# Patient Record
Sex: Male | Born: 1940 | Race: White | Hispanic: No | Marital: Married | Smoking: Never smoker
Health system: Southern US, Community
[De-identification: ages and names within clinical notes are randomized; demographics above are authoritative.]

## PROBLEM LIST (undated history)

## (undated) DIAGNOSIS — R7301 Impaired fasting glucose: Secondary | ICD-10-CM

## (undated) DIAGNOSIS — I6529 Occlusion and stenosis of unspecified carotid artery: Secondary | ICD-10-CM

## (undated) DIAGNOSIS — G459 Transient cerebral ischemic attack, unspecified: Secondary | ICD-10-CM

## (undated) DIAGNOSIS — F419 Anxiety disorder, unspecified: Secondary | ICD-10-CM

## (undated) DIAGNOSIS — N200 Calculus of kidney: Secondary | ICD-10-CM

## (undated) DIAGNOSIS — I2510 Atherosclerotic heart disease of native coronary artery without angina pectoris: Secondary | ICD-10-CM

## (undated) DIAGNOSIS — N400 Enlarged prostate without lower urinary tract symptoms: Secondary | ICD-10-CM

## (undated) DIAGNOSIS — D539 Nutritional anemia, unspecified: Secondary | ICD-10-CM

## (undated) DIAGNOSIS — E785 Hyperlipidemia, unspecified: Secondary | ICD-10-CM

## (undated) DIAGNOSIS — I700 Atherosclerosis of aorta: Secondary | ICD-10-CM

## (undated) DIAGNOSIS — D472 Monoclonal gammopathy: Secondary | ICD-10-CM

## (undated) DIAGNOSIS — Z9889 Other specified postprocedural states: Secondary | ICD-10-CM

## (undated) DIAGNOSIS — E559 Vitamin D deficiency, unspecified: Secondary | ICD-10-CM

## (undated) DIAGNOSIS — R413 Other amnesia: Secondary | ICD-10-CM

## (undated) DIAGNOSIS — H9193 Unspecified hearing loss, bilateral: Secondary | ICD-10-CM

## (undated) DIAGNOSIS — H6120 Impacted cerumen, unspecified ear: Secondary | ICD-10-CM

## (undated) DIAGNOSIS — D7589 Other specified diseases of blood and blood-forming organs: Secondary | ICD-10-CM

## (undated) DIAGNOSIS — Z860100 Personal history of colon polyps, unspecified: Secondary | ICD-10-CM

## (undated) DIAGNOSIS — I493 Ventricular premature depolarization: Secondary | ICD-10-CM

## (undated) DIAGNOSIS — I10 Essential (primary) hypertension: Secondary | ICD-10-CM

## (undated) HISTORY — DX: Ventricular premature depolarization: I49.3

## (undated) HISTORY — DX: Occlusion and stenosis of unspecified carotid artery: I65.29

## (undated) HISTORY — DX: Other amnesia: R41.3

## (undated) HISTORY — DX: Calculus of kidney: N20.0

## (undated) HISTORY — DX: Nutritional anemia, unspecified: D53.9

## (undated) HISTORY — DX: Other specified postprocedural states: Z98.890

## (undated) HISTORY — DX: Vitamin D deficiency, unspecified: E55.9

## (undated) HISTORY — DX: Transient cerebral ischemic attack, unspecified: G45.9

## (undated) HISTORY — DX: Impacted cerumen, unspecified ear: H61.20

## (undated) HISTORY — DX: Essential (primary) hypertension: I10

## (undated) HISTORY — DX: Hyperlipidemia, unspecified: E78.5

## (undated) HISTORY — DX: Personal history of colonic polyps: Z86.010

## (undated) HISTORY — DX: Other specified diseases of blood and blood-forming organs: D75.89

## (undated) HISTORY — PX: PROSTATECTOMY: SHX69

## (undated) HISTORY — DX: Atherosclerosis of aorta: I70.0

## (undated) HISTORY — DX: Personal history of colon polyps, unspecified: Z86.0100

## (undated) HISTORY — DX: Anxiety disorder, unspecified: F41.9

## (undated) HISTORY — DX: Benign prostatic hyperplasia without lower urinary tract symptoms: N40.0

## (undated) HISTORY — DX: Monoclonal gammopathy: D47.2

## (undated) HISTORY — DX: Impaired fasting glucose: R73.01

## (undated) HISTORY — PX: CATARACT EXTRACTION, BILATERAL: SHX1313

## (undated) HISTORY — DX: Unspecified hearing loss, bilateral: H91.93

## (undated) HISTORY — DX: Atherosclerotic heart disease of native coronary artery without angina pectoris: I25.10

---

## 1997-11-10 ENCOUNTER — Other Ambulatory Visit: Admission: RE | Admit: 1997-11-10 | Discharge: 1997-11-10 | Payer: Self-pay | Admitting: Urology

## 2002-05-31 ENCOUNTER — Encounter: Admission: RE | Admit: 2002-05-31 | Discharge: 2002-05-31 | Payer: Self-pay | Admitting: Urology

## 2002-05-31 ENCOUNTER — Encounter: Payer: Self-pay | Admitting: Urology

## 2002-06-20 ENCOUNTER — Encounter: Payer: Self-pay | Admitting: Urology

## 2002-06-20 ENCOUNTER — Ambulatory Visit (HOSPITAL_BASED_OUTPATIENT_CLINIC_OR_DEPARTMENT_OTHER): Admission: RE | Admit: 2002-06-20 | Discharge: 2002-06-20 | Payer: Self-pay | Admitting: Urology

## 2002-06-27 ENCOUNTER — Encounter: Payer: Self-pay | Admitting: Urology

## 2002-06-27 ENCOUNTER — Encounter: Admission: RE | Admit: 2002-06-27 | Discharge: 2002-06-27 | Payer: Self-pay | Admitting: Urology

## 2008-03-16 IMAGING — CR DG CHEST 2V
2 series · 2 of 2 positions shown · non-contrast
Comparison: None

CLINICAL DATA: Preop for surgery on the prostate for enlarged
prostate

CHEST - 2 VIEW

[w chest pa]
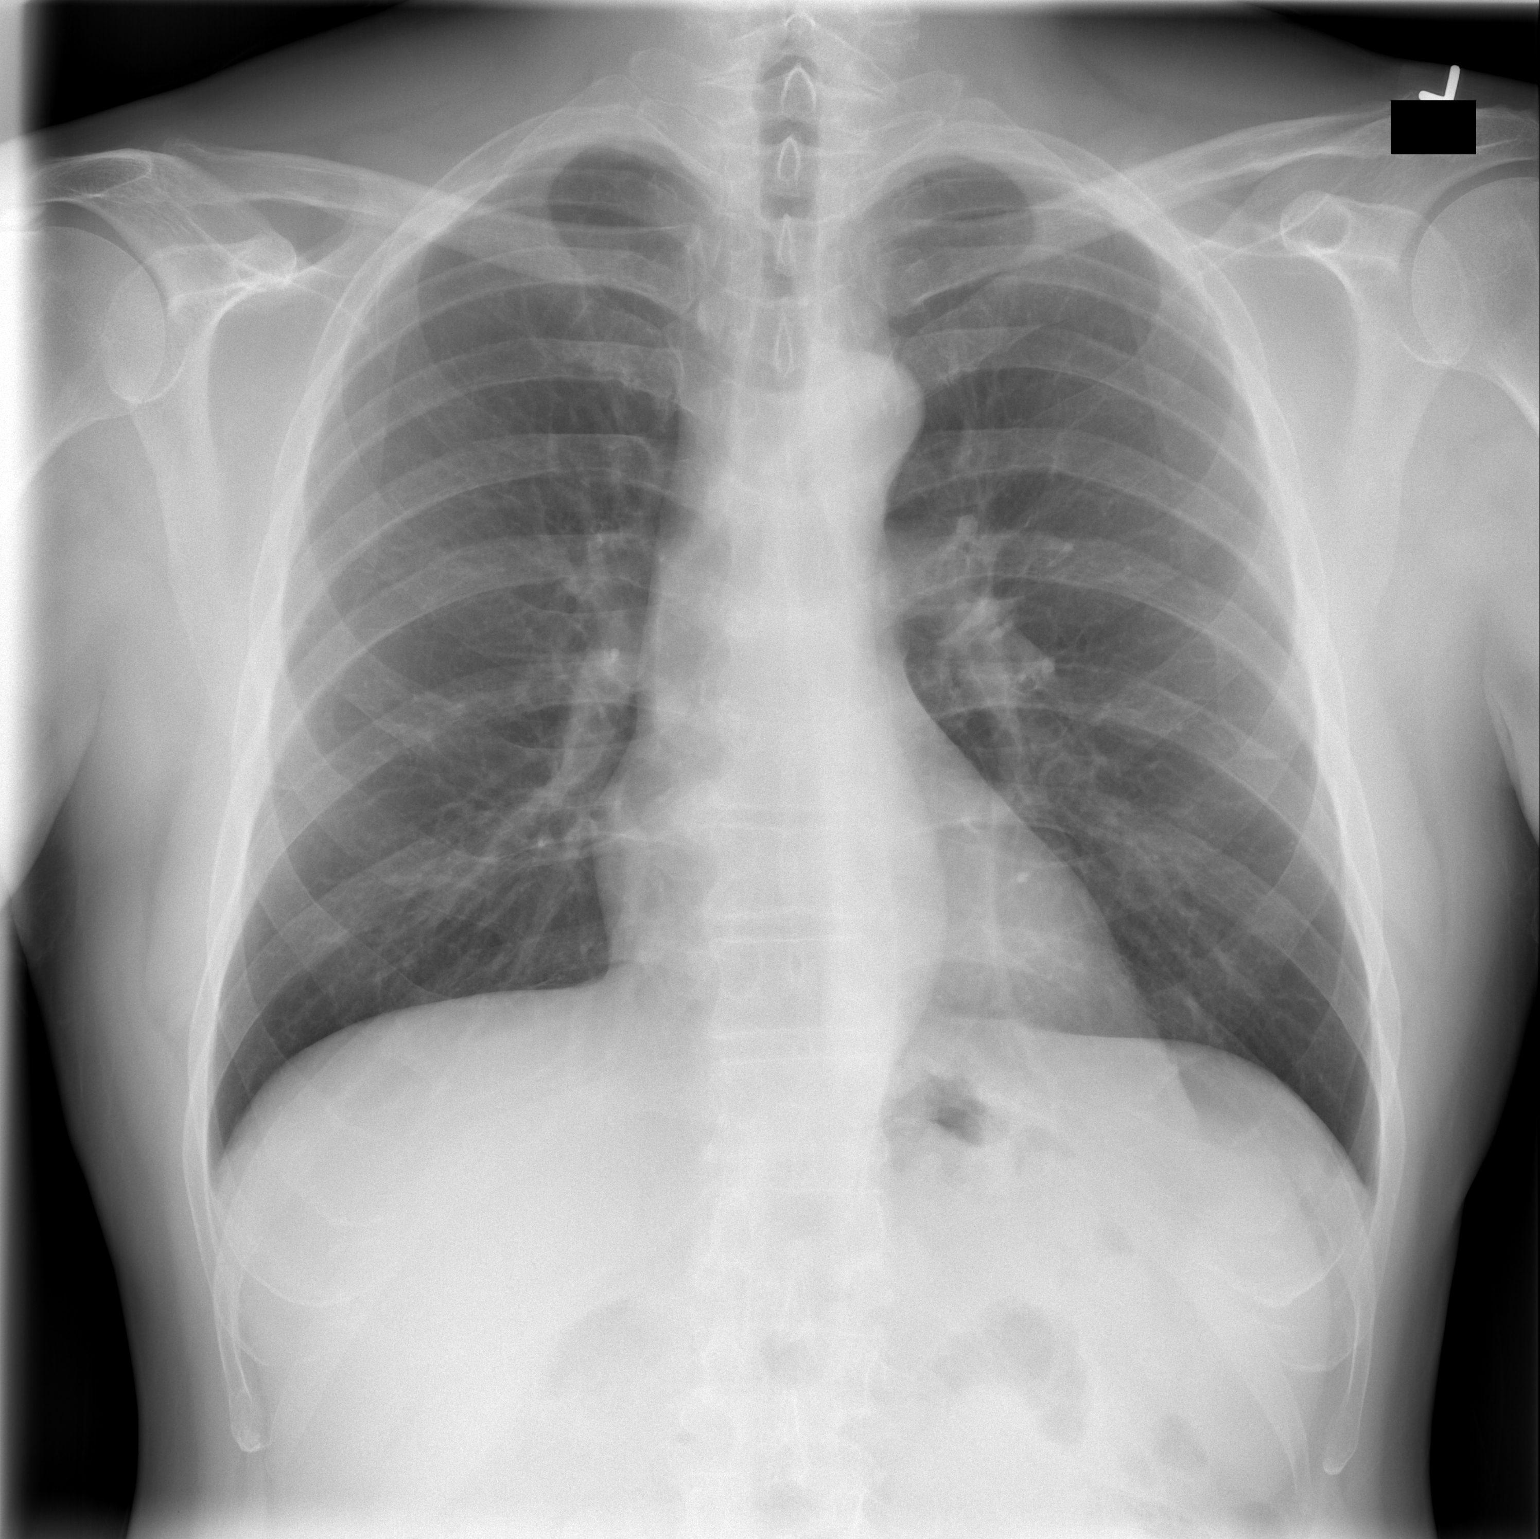

[w chest lat]
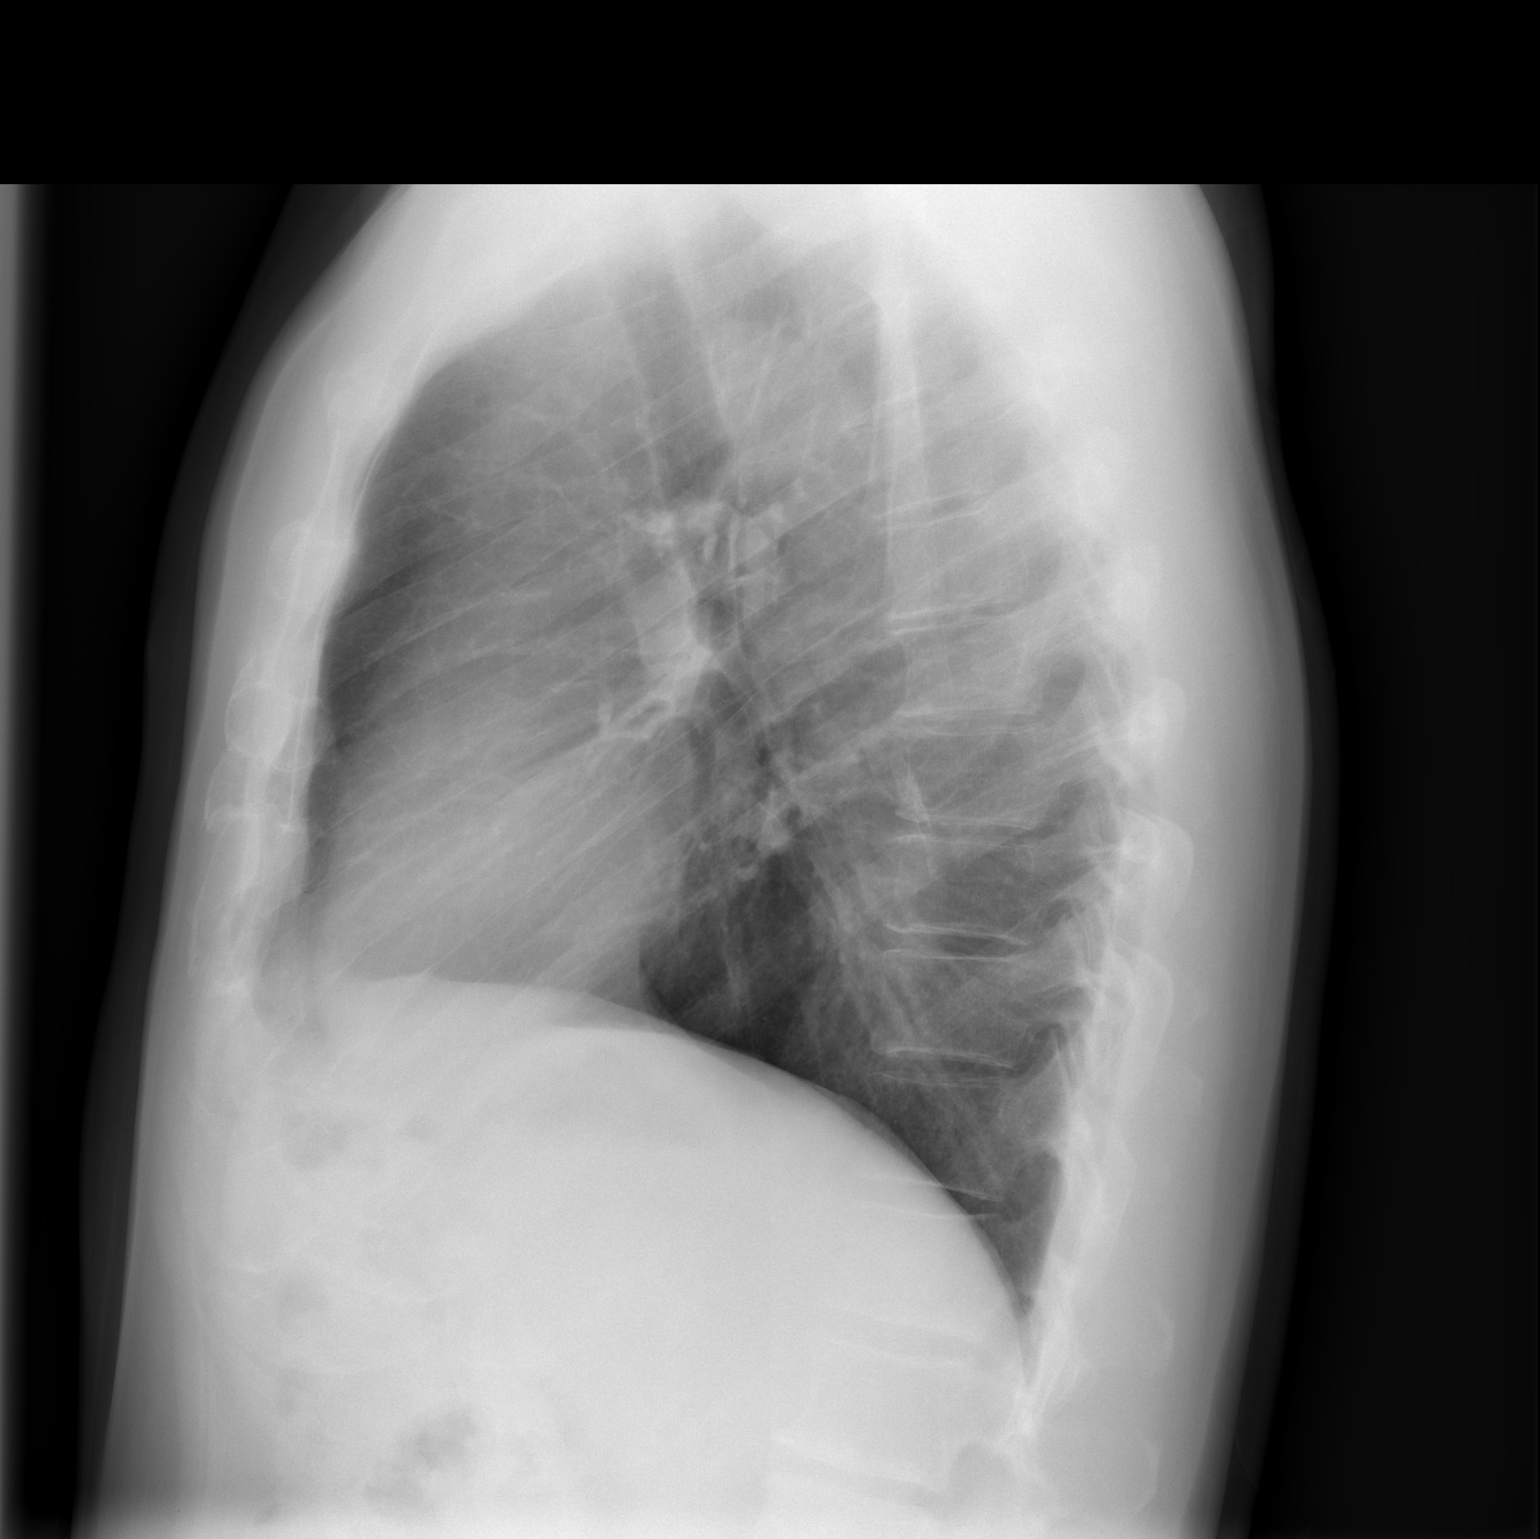

[2 of 2 positions shown; findings below may reference images not displayed]

FINDINGS: Two views of the chest show the lungs to be clear.  The
heart is within normal limits in size.  No bony abnormality is
seen.
IMPRESSION: No active lung disease.

## 2008-03-20 ENCOUNTER — Encounter (INDEPENDENT_AMBULATORY_CARE_PROVIDER_SITE_OTHER): Payer: Self-pay | Admitting: Urology

## 2008-03-20 ENCOUNTER — Inpatient Hospital Stay (HOSPITAL_COMMUNITY): Admission: RE | Admit: 2008-03-20 | Discharge: 2008-03-23 | Payer: Self-pay | Admitting: Urology

## 2010-11-12 NOTE — Op Note (Signed)
NAME:  Patrick Macias, Patrick Macias               ACCOUNT NO.:  [id number]   MEDICAL RECORD NO.:  [id number]          PATIENT TYPE:  INP   LOCATION:  1410                         FACILITY:  [facility]   PHYSICIAN:  Sigmund I. Patsi Sears, M.D.DATE OF BIRTH:  [birth year]   DATE OF PROCEDURE:  03/20/2008  DATE OF DISCHARGE:                               OPERATIVE REPORT   PREOPERATIVE DIAGNOSES:  1. Benign prostatic hypertrophy with obstruction.  2. Urinary retention.   POSTOPERATIVE DIAGNOSIS:  1. Benign prostatic hypertrophy with obstruction.  2. Urinary retention.   PROCEDURE:  Suprapubic prostatectomy.   ATTENDING PHYSICIAN:  Jethro Bolus, M.D.   RESIDENT PHYSICIAN:  Delman Kitten, M.D.   SECOND ASSISTANT:  Delia Chimes, nurse practitioner.   ANESTHESIA:  General plus local.   INDICATIONS FOR PROCEDURE:  Patrick Macias is a 70 year old white male with  known history of benign prostatic hypertrophy.  He has failed medical  therapy and currently has a urinary catheter secondary to recurrent  retention.  On transrectal ultrasound, his prostate measured  approximately 140 grams.  He did have urodynamics preoperatively which  showed a stable bladder with good function.  In discussing his options,  including minimally invasive therapy versus open prostatectomy, he has  elected to proceed with a simple retropubic prostatectomy.  After risks,  benefits, consequences and concerns were discussed, informed consent was  obtained.   PROCEDURE IN DETAIL:  The patient was brought to operating room and  placed in a supine position.  He was correctly identified by his  wristband and an appropriate time-out was taken.  IV antibiotics were  administered, general anesthesia was delivered.  Once adequately  anesthetized, his lower abdomen was clipped, prepped and draped in a  normal sterile fashion.  This was done after a gel pad bump was placed  under his lower back and the table was slightly flexed to allow  better  exposure of his pelvis.  Prior to incision, we placed a 22-French Foley  catheter and inflated balloon with 30 mL of fluid. We made an incision  in the lower midline of his abdomen.  We carried this down with Bovie  electrocautery through the subcutaneous tissue down to the level of the  rectus fascia.  The rectus fascia was divided at the midline through the  entire length of our incision.  The rectus muscles were then separated  in the midline as well.  This exposed the transversalis fascia which was  incised sharply and swept cranially and this exposed the space of  Retzius.  The peritoneum was mobilized cranially as well.  We developed  our plane of dissection bluntly towards both lateral aspects of the  prostate gland and the space of Retzius all the way to the pubic bones  and the endopelvic fascia.  No hernias were appreciated here.  We then  placed initially a Balfour retractor, but this did not allow adequate  exposure.  Subsequently, we removed this and placed our Omni-Tract  retractor with a malleable Marshall blade holding the bladder in a  cephalad position.  We then defatted the anterior aspect of the  prostate  to allow for better exposure.  We took down the puboprostatic ligaments  with Bovie electrocautery and used the Bovie to coagulate the  superficial dorsal vein as well.  We then turned our attention to  achieving hemostasis.  We controlled our dorsal vein with multiple  figure-of-eight sutures using 2-0 Vicryl.  We did encounter some  bleeding during this control, but eventually we were able to get  hemostasis here.  He also had accessory vessels bilaterally that were  spared in our dissection with control of hemostasis.  We also placed two  figure-of-eight sutures using the same 2-0 Vicryl at the level of the  prostatic vascular pedicles bilaterally.  We then made our capsulotomy  sharply with Bovie electrocautery and then Metzenbaum scissors 2 cm  distal to  the bladder neck extending from our pedicle sutures.  We then  developed a plane between the capsule and the adenoma initially using  Metzenbaum scissors until the appropriate plane was established.  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  Once all edges had been freed up, we used Metzenbaum with scissors to  incise the anterior commissure from the bladder neck to the apex.  We  did keep the posterior aspect of the prostate intact.  We identified our  Foley catheter and passed the right lobe of our gland underneath it to  allow for delivery of the entire gland intact.  We then used sharp  dissection to remove the last remaining fibers of the posterior  prostatic urethra that were attached.  We then removed the adenoma from  the wound and passed it off the table after placing marking sutures to  help with pathologic analysis.  We did appreciate that during our  dissection we had entered the bladder neck slightly.  We then irrigated  our prostatic fossa and wound with copious amounts of antibiotics  without any irrigant.  There was no significant bleeding at this point.  Likewise, we began our closure.  We initially reapproximated the urethra  as best we could.  He had a long strip of posterior prostatic urethra  extending down from the bladder neck.  This was tacked into place behind  the Foley catheter with 4-0 Vicryl.  We then reapproximated the right  and left and anterior aspects of the urethra as well in a similar  fashion.  After this, we were able to appreciate near circumferential  closure of the urethra.  Our bladder neck was reapproximated during this  as well.  At this point, we instilled 20 mL more fluid into our Foley  catheter balloon bringing the volume to a total of 50 mL.  We then  closed our prostatic capsule with two different sutures, one from the  right and one from the left meeting in the midline and this was done  with 2-0 Vicryl.   We then copiously irrigated his bladder.  Here, we  found him to have a fair amount of hematuria, but we irrigated him and  there were no clots and we irrigated until he was light pink.  We then  placed our 10-French fluted Blake drain through a stab incision in the  left lower quadrant and placed it in the space of Retzius.  We  reinspected our surgical site one last time and there was no active  bleeding and likewise we elected to close.  Prior to closure, we  irrigated one last time.  We reapproximated the edges of  the rectus  fascia with a running suture using 0-PDS.  The subcutaneous tissue then  reapproximated with simple interrupted sutures using 2-0 Vicryl and the  skin was reapproximated with multiple surgical stainless steel staples.  The wound was recleaned, reinspected and dressed appropriately and this  marked the end of our procedure.  He tolerated the procedure well.  Estimated blood loss was 1800 mL.  He awoke from anesthesia and was  taken to recovery room in stable condition.  There were no  complications.  Dr. Patsi Sears was present and had participated in all  aspects of the case.     ______________________________  Hanley Ben I. Patsi Sears, M.D.  Electronically Signed    DW/MEDQ  D:  03/20/2008  T:  03/21/2008  Job:  [id number]

## 2010-11-12 NOTE — Discharge Summary (Signed)
NAME:  Patrick Macias, Patrick Macias               ACCOUNT NO.:  [id number]   MEDICAL RECORD NO.:  [id number]          PATIENT TYPE:  INP   LOCATION:  1410                         FACILITY:  [facility]   PHYSICIAN:  Sigmund I. Patsi Sears, M.D.DATE OF BIRTH:  04/22/1941   DATE OF ADMISSION:  03/20/2008  DATE OF DISCHARGE:  03/23/2008                               DISCHARGE SUMMARY   PREOPERATIVE DIAGNOSES:  1. Benign prostatic hypertrophy.  2. Urinary retention.   POSTOPERATIVE DIAGNOSES:  1. Benign prostatic hypertrophy.  2. Urinary retention.  3. Acute blood loss anemia.   SECONDARY DIAGNOSIS:  Hypertension.   PROCEDURE:  Simple retropubic prostatectomy on March 20, 2008.   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   INDICATION FOR HOSPITALIZATION:  Patrick Macias is a 70 year old white male  with past medical history positive for benign prostatic hypertrophy.  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  His prostate measured 140 cc on transrectal ultrasound and likewise he  was recommended to have the above stated procedure performed.   HOSPITALIZATION DETAILS:  The patient was sent to the regular patient  ward for his convalescence postoperatively.  He did have a fair amount  of hematuria requiring hourly bladder irrigations by the nursing staff.  This was done very well and at no point did he have any catheter or clot  complications.  By the evening of postop day 1, his urine had begun to  clear.  At the time of discharge, his urine with activity is only light  pink.  Initially, he was n.p.o. and this was advanced during his  hospitalization.  At the time of discharge, he is tolerating a regular  diet without any complaints.  He is passing flatus.  He did have a JP  drain postoperatively which had minimal output.  The drain fluid was  checked and had 1.1 creatinine, and was  removed on the morning of postop  day 3.  During his entire hospitalization, his wounds remained clean,  dry and intact.  His abdomen is soft and appropriately tender.  Otherwise, he has not had any other complications or events during this  hospitalization.  He has had a good recovery from surgery.  His postop  hemoglobin was 9.2 and this was after he received 1 unit of packed red  blood cells intraoperatively.  He was hemodynamically stable, although  we are holding his blood pressure medication at the present time.   On the day of discharge, he is afebrile, tolerating a regular diet, he  is ambulating and is able to perform his activities of daily living.  His pain is controlled on oral medication. Likewise, it is our  impression of the urologic service that he has maximized his benefit as  an inpatient here at [facility] and will be discharged home  appropriately.   DISCHARGE INSTRUCTIONS:  1. No lifting greater than 10 pounds for 6 weeks.  2. No driving for at least 2 weeks.  3. No sexual activity for 6 weeks.  4.  Increase activity slowly.  5. He may walk up steps.  6. Diet is as tolerated, no restrictions.  7. Okay to shower, but no bathing or swimming.  8. Foley catheter instructions have been reviewed with him regarding      the leg bags, night bags and their maintenance.  9. He has been counseled on appropriate activity level especially if      his urine becomes increasingly bloody that he needs to rest and      stay well hydrated.  10.Call or return to clinic if he develops fever greater than 101.5,      uncontrolled pain, wound or catheter problems, or any concerns or      questions arise.   DISCHARGE MEDICATIONS:  Please see medication reconciliation sheet.  He  will be holding his blood pressure medication for now.  He will be  discharged home on Vicodin as well as he has been given a prescription  for Cipro for pericatheter antibiotic treatment.  He will  also be  recommended to take iron sulfate supplementation as well as Colace to  prevent constipation.   FOLLOW UP:  Follow up appointment is next Wednesday or Thursday with Dr.  Patsi Sears.  He will have a cystogram at that time and probable catheter  removal.      Melina Schools, MD      Sigmund I. Patsi Sears, M.D.  Electronically Signed    JR/MEDQ  D:  03/23/2008  T:  03/23/2008  Job:  [id number]

## 2011-03-31 LAB — DIFFERENTIAL
Basophils Relative: 0
Eosinophils Absolute: 0
Lymphs Abs: 1.1
Monocytes Absolute: 1.1 — ABNORMAL HIGH
Monocytes Relative: 12
Neutrophils Relative %: 76

## 2011-03-31 LAB — TYPE AND SCREEN
ABO/RH(D): A POS
Antibody Screen: NEGATIVE

## 2011-03-31 LAB — BASIC METABOLIC PANEL
BUN: 20
CO2: 29
CO2: 32
Calcium: 9.4
Chloride: 101
Chloride: 104
Creatinine, Ser: 0.97
Creatinine, Ser: 0.98
GFR calc Af Amer: >60
GFR calc Af Amer: >60
GFR calc non Af Amer: >60
Glucose, Bld: 112 — ABNORMAL HIGH
Potassium: 4
Potassium: 4.5
Sodium: 136
Sodium: 138

## 2011-03-31 LAB — HEMOGLOBIN AND HEMATOCRIT, BLOOD
HCT: 28.2 — ABNORMAL LOW
HCT: 44

## 2011-03-31 LAB — CBC
HCT: 26.8 — ABNORMAL LOW
Hemoglobin: 9.2 — ABNORMAL LOW
MCHC: 34.4
MCV: 94.4
RBC: 2.84 — ABNORMAL LOW
WBC: 9

## 2011-03-31 LAB — POCT I-STAT 4, (NA,K, GLUC, HGB,HCT)
HCT: 26 — ABNORMAL LOW
Sodium: 138

## 2011-03-31 LAB — ABO/RH: ABO/RH(D): A POS

## 2011-03-31 LAB — CREATININE, FLUID (PLEURAL, PERITONEAL, JP DRAINAGE)

## 2013-06-09 ENCOUNTER — Other Ambulatory Visit: Payer: Self-pay | Admitting: Gastroenterology

## 2013-08-02 ENCOUNTER — Encounter: Payer: Self-pay | Admitting: Family Medicine

## 2013-08-02 ENCOUNTER — Ambulatory Visit (INDEPENDENT_AMBULATORY_CARE_PROVIDER_SITE_OTHER): Payer: Medicare HMO | Admitting: Family Medicine

## 2013-08-02 VITALS — BP 148/71 | HR 54 | Ht 72.0 in | Wt 175.0 lb

## 2013-08-02 DIAGNOSIS — M25519 Pain in unspecified shoulder: Secondary | ICD-10-CM

## 2013-08-02 DIAGNOSIS — M25511 Pain in right shoulder: Secondary | ICD-10-CM

## 2013-08-02 NOTE — Patient Instructions (Signed)
You have rotator cuff impingement, calcific rotator cuff tendinitis. Try to avoid painful activities (overhead activities, lifting with extended arm) as much as possible. Aleve 2 tabs twice a day with food OR ibuprofen 3 tabs three times a day with food for pain and inflammation. Can take tylenol in addition to this. Subacromial injection may be beneficial to help with pain and to decrease inflammation - consider this in the future if exercises not helping. Do home exercise program with theraband and scapular stabilization exercises daily - these are very important for long term relief even if an injection was given.  3 sets of 10 each exercise once a day. Consider nitro patches in future. If not improving at follow-up we will consider further imaging, physical therapy, injection, and/or nitro patches. Follow up with me in about 6 weeks.

## 2013-08-04 ENCOUNTER — Encounter: Payer: Self-pay | Admitting: Family Medicine

## 2013-08-04 NOTE — Progress Notes (Signed)
Patient ID: Patrick Macias, male   DOB: 03-22-1941, 73 y.o.   MRN: [id number]  PCP: Tommy Medal, MD  Subjective:   HPI: Patient is a 73 y.o. male here for right shoulder pain.  Patient reports having had some pain in this shoulder dating back to a year ago but nothing constant. Then past 2 months has been more constant. No injury. Worse with reaching and reaching behind, pushing/pulling. Tried acupuncture, aleve. + night pain. Has not done PT or home exercise program.  Past Medical History  Diagnosis Date  . Hypertension     No current outpatient prescriptions on file prior to visit.   No current facility-administered medications on file prior to visit.    Past Surgical History  Procedure Laterality Date  . Prostatectomy      No Known Allergies  History   Social History  . Marital Status: Married    Spouse Name: N/A    Number of Children: N/A  . Years of Education: N/A   Occupational History  . Not on file.   Social History Main Topics  . Smoking status: Never Smoker   . Smokeless tobacco: Not on file  . Alcohol Use: Not on file  . Drug Use: Not on file  . Sexual Activity: Not on file   Other Topics Concern  . Not on file   Social History Narrative  . No narrative on file    Family History  Problem Relation Age of Onset  . Sudden death Neg Hx   . Hypertension Neg Hx   . Hyperlipidemia Neg Hx   . Diabetes Neg Hx   . Heart attack Neg Hx     BP 148/71  Pulse 54  Ht 6' (1.829 m)  Wt 175 lb (79.379 kg)  BMI 23.73 kg/m2  Review of Systems: See HPI above.    Objective:  Physical Exam:  Gen: NAD  Right shoulder: No swelling, ecchymoses.  No gross deformity. No TTP AC, biceps tendon. FROM with painful arc. Positive Hawkins, Neers. Negative Speeds, Yergasons. Strength 5/5 with empty can and resisted internal/external rotation.  Pain empty can. Negative apprehension. NV intact distally.    Assessment & Plan:  1. Right shoulder pain -  2/2 rotator cuff impingement, calcific rotator cuff tendinitis on radiographs.  Start with home exercise program, nsaids.  Consider PT, nitro patches, injection if not improving.  F/u in 6 weeks.

## 2013-08-10 ENCOUNTER — Encounter: Payer: Self-pay | Admitting: Family Medicine

## 2013-08-10 DIAGNOSIS — M25511 Pain in right shoulder: Secondary | ICD-10-CM | POA: Insufficient documentation

## 2013-08-10 NOTE — Assessment & Plan Note (Signed)
2/2 rotator cuff impingement, calcific rotator cuff tendinitis on radiographs.  Start with home exercise program, nsaids.  Consider PT, nitro patches, injection if not improving.  F/u in 6 weeks.

## 2013-09-13 ENCOUNTER — Ambulatory Visit: Payer: Medicare HMO | Admitting: Family Medicine

## 2015-05-08 DIAGNOSIS — Z23 Encounter for immunization: Secondary | ICD-10-CM | POA: Diagnosis not present

## 2015-06-27 DIAGNOSIS — I10 Essential (primary) hypertension: Secondary | ICD-10-CM | POA: Diagnosis not present

## 2015-06-27 DIAGNOSIS — N400 Enlarged prostate without lower urinary tract symptoms: Secondary | ICD-10-CM | POA: Diagnosis not present

## 2015-08-08 DIAGNOSIS — M720 Palmar fascial fibromatosis [Dupuytren]: Secondary | ICD-10-CM | POA: Diagnosis not present

## 2015-08-08 DIAGNOSIS — D2372 Other benign neoplasm of skin of left lower limb, including hip: Secondary | ICD-10-CM | POA: Diagnosis not present

## 2015-09-22 DIAGNOSIS — S61207A Unspecified open wound of left little finger without damage to nail, initial encounter: Secondary | ICD-10-CM | POA: Diagnosis not present

## 2015-10-02 DIAGNOSIS — Z5189 Encounter for other specified aftercare: Secondary | ICD-10-CM | POA: Diagnosis not present

## 2015-10-02 DIAGNOSIS — Z4802 Encounter for removal of sutures: Secondary | ICD-10-CM | POA: Diagnosis not present

## 2015-10-04 DIAGNOSIS — I10 Essential (primary) hypertension: Secondary | ICD-10-CM | POA: Diagnosis not present

## 2015-10-04 DIAGNOSIS — D126 Benign neoplasm of colon, unspecified: Secondary | ICD-10-CM | POA: Diagnosis not present

## 2015-10-04 DIAGNOSIS — Z1389 Encounter for screening for other disorder: Secondary | ICD-10-CM | POA: Diagnosis not present

## 2015-10-04 DIAGNOSIS — Z125 Encounter for screening for malignant neoplasm of prostate: Secondary | ICD-10-CM | POA: Diagnosis not present

## 2015-10-04 DIAGNOSIS — Z5189 Encounter for other specified aftercare: Secondary | ICD-10-CM | POA: Diagnosis not present

## 2015-10-04 DIAGNOSIS — Z6824 Body mass index (BMI) 24.0-24.9, adult: Secondary | ICD-10-CM | POA: Diagnosis not present

## 2016-03-18 DIAGNOSIS — D7589 Other specified diseases of blood and blood-forming organs: Secondary | ICD-10-CM | POA: Diagnosis not present

## 2016-03-18 DIAGNOSIS — E559 Vitamin D deficiency, unspecified: Secondary | ICD-10-CM | POA: Diagnosis not present

## 2016-03-18 DIAGNOSIS — E784 Other hyperlipidemia: Secondary | ICD-10-CM | POA: Diagnosis not present

## 2016-03-18 DIAGNOSIS — R7301 Impaired fasting glucose: Secondary | ICD-10-CM | POA: Diagnosis not present

## 2016-03-18 DIAGNOSIS — Z125 Encounter for screening for malignant neoplasm of prostate: Secondary | ICD-10-CM | POA: Diagnosis not present

## 2016-03-18 DIAGNOSIS — Z23 Encounter for immunization: Secondary | ICD-10-CM | POA: Diagnosis not present

## 2016-03-18 DIAGNOSIS — I10 Essential (primary) hypertension: Secondary | ICD-10-CM | POA: Diagnosis not present

## 2016-04-09 DIAGNOSIS — H2510 Age-related nuclear cataract, unspecified eye: Secondary | ICD-10-CM | POA: Diagnosis not present

## 2016-04-09 DIAGNOSIS — H524 Presbyopia: Secondary | ICD-10-CM | POA: Diagnosis not present

## 2016-04-09 DIAGNOSIS — Z0100 Encounter for examination of eyes and vision without abnormal findings: Secondary | ICD-10-CM | POA: Diagnosis not present

## 2016-07-21 DIAGNOSIS — H9193 Unspecified hearing loss, bilateral: Secondary | ICD-10-CM | POA: Diagnosis not present

## 2016-07-21 DIAGNOSIS — H6123 Impacted cerumen, bilateral: Secondary | ICD-10-CM | POA: Diagnosis not present

## 2016-07-21 DIAGNOSIS — J329 Chronic sinusitis, unspecified: Secondary | ICD-10-CM | POA: Diagnosis not present

## 2016-07-25 DIAGNOSIS — R509 Fever, unspecified: Secondary | ICD-10-CM | POA: Diagnosis not present

## 2016-07-25 DIAGNOSIS — J09X2 Influenza due to identified novel influenza A virus with other respiratory manifestations: Secondary | ICD-10-CM | POA: Diagnosis not present

## 2016-07-25 DIAGNOSIS — R05 Cough: Secondary | ICD-10-CM | POA: Diagnosis not present

## 2016-08-01 DIAGNOSIS — Z1283 Encounter for screening for malignant neoplasm of skin: Secondary | ICD-10-CM | POA: Diagnosis not present

## 2016-08-01 DIAGNOSIS — D225 Melanocytic nevi of trunk: Secondary | ICD-10-CM | POA: Diagnosis not present

## 2016-08-01 DIAGNOSIS — D485 Neoplasm of uncertain behavior of skin: Secondary | ICD-10-CM | POA: Diagnosis not present

## 2016-08-01 DIAGNOSIS — L570 Actinic keratosis: Secondary | ICD-10-CM | POA: Diagnosis not present

## 2016-08-01 DIAGNOSIS — X32XXXD Exposure to sunlight, subsequent encounter: Secondary | ICD-10-CM | POA: Diagnosis not present

## 2016-10-17 DIAGNOSIS — H5203 Hypermetropia, bilateral: Secondary | ICD-10-CM | POA: Diagnosis not present

## 2016-10-17 DIAGNOSIS — H2513 Age-related nuclear cataract, bilateral: Secondary | ICD-10-CM | POA: Diagnosis not present

## 2016-10-28 DIAGNOSIS — E559 Vitamin D deficiency, unspecified: Secondary | ICD-10-CM | POA: Diagnosis not present

## 2016-10-28 DIAGNOSIS — R7301 Impaired fasting glucose: Secondary | ICD-10-CM | POA: Diagnosis not present

## 2016-10-28 DIAGNOSIS — E784 Other hyperlipidemia: Secondary | ICD-10-CM | POA: Diagnosis not present

## 2016-10-28 DIAGNOSIS — I10 Essential (primary) hypertension: Secondary | ICD-10-CM | POA: Diagnosis not present

## 2016-10-28 DIAGNOSIS — Z125 Encounter for screening for malignant neoplasm of prostate: Secondary | ICD-10-CM | POA: Diagnosis not present

## 2016-11-03 DIAGNOSIS — E784 Other hyperlipidemia: Secondary | ICD-10-CM | POA: Diagnosis not present

## 2016-11-03 DIAGNOSIS — E559 Vitamin D deficiency, unspecified: Secondary | ICD-10-CM | POA: Diagnosis not present

## 2016-11-03 DIAGNOSIS — I10 Essential (primary) hypertension: Secondary | ICD-10-CM | POA: Diagnosis not present

## 2016-11-03 DIAGNOSIS — H6123 Impacted cerumen, bilateral: Secondary | ICD-10-CM | POA: Diagnosis not present

## 2016-11-03 DIAGNOSIS — D7589 Other specified diseases of blood and blood-forming organs: Secondary | ICD-10-CM | POA: Diagnosis not present

## 2016-11-03 DIAGNOSIS — H9193 Unspecified hearing loss, bilateral: Secondary | ICD-10-CM | POA: Diagnosis not present

## 2016-11-03 DIAGNOSIS — D126 Benign neoplasm of colon, unspecified: Secondary | ICD-10-CM | POA: Diagnosis not present

## 2016-11-03 DIAGNOSIS — Z0000 Encounter for general adult medical examination without abnormal findings: Secondary | ICD-10-CM | POA: Diagnosis not present

## 2016-11-03 DIAGNOSIS — R7301 Impaired fasting glucose: Secondary | ICD-10-CM | POA: Diagnosis not present

## 2016-11-03 DIAGNOSIS — Z1389 Encounter for screening for other disorder: Secondary | ICD-10-CM | POA: Diagnosis not present

## 2016-12-02 DIAGNOSIS — D485 Neoplasm of uncertain behavior of skin: Secondary | ICD-10-CM | POA: Diagnosis not present

## 2016-12-23 DIAGNOSIS — H25043 Posterior subcapsular polar age-related cataract, bilateral: Secondary | ICD-10-CM | POA: Diagnosis not present

## 2016-12-23 DIAGNOSIS — H2511 Age-related nuclear cataract, right eye: Secondary | ICD-10-CM | POA: Diagnosis not present

## 2016-12-23 DIAGNOSIS — H25013 Cortical age-related cataract, bilateral: Secondary | ICD-10-CM | POA: Diagnosis not present

## 2016-12-23 DIAGNOSIS — H2513 Age-related nuclear cataract, bilateral: Secondary | ICD-10-CM | POA: Diagnosis not present

## 2016-12-23 DIAGNOSIS — H0230 Blepharochalasis unspecified eye, unspecified eyelid: Secondary | ICD-10-CM | POA: Diagnosis not present

## 2016-12-24 DIAGNOSIS — Z87442 Personal history of urinary calculi: Secondary | ICD-10-CM | POA: Diagnosis not present

## 2016-12-24 DIAGNOSIS — R1084 Generalized abdominal pain: Secondary | ICD-10-CM | POA: Diagnosis not present

## 2017-02-23 DIAGNOSIS — H2511 Age-related nuclear cataract, right eye: Secondary | ICD-10-CM | POA: Diagnosis not present

## 2017-02-24 DIAGNOSIS — H2512 Age-related nuclear cataract, left eye: Secondary | ICD-10-CM | POA: Diagnosis not present

## 2017-02-27 DIAGNOSIS — Z23 Encounter for immunization: Secondary | ICD-10-CM | POA: Diagnosis not present

## 2017-03-03 DIAGNOSIS — Z961 Presence of intraocular lens: Secondary | ICD-10-CM | POA: Diagnosis not present

## 2017-03-03 DIAGNOSIS — H2511 Age-related nuclear cataract, right eye: Secondary | ICD-10-CM | POA: Diagnosis not present

## 2017-03-05 DIAGNOSIS — H6123 Impacted cerumen, bilateral: Secondary | ICD-10-CM | POA: Diagnosis not present

## 2017-03-16 DIAGNOSIS — H2512 Age-related nuclear cataract, left eye: Secondary | ICD-10-CM | POA: Diagnosis not present

## 2017-03-23 DIAGNOSIS — H2512 Age-related nuclear cataract, left eye: Secondary | ICD-10-CM | POA: Diagnosis not present

## 2017-05-27 DIAGNOSIS — R69 Illness, unspecified: Secondary | ICD-10-CM | POA: Diagnosis not present

## 2017-06-17 DIAGNOSIS — Z0101 Encounter for examination of eyes and vision with abnormal findings: Secondary | ICD-10-CM | POA: Diagnosis not present

## 2017-07-02 DIAGNOSIS — Z0101 Encounter for examination of eyes and vision with abnormal findings: Secondary | ICD-10-CM | POA: Diagnosis not present

## 2017-09-23 DIAGNOSIS — L821 Other seborrheic keratosis: Secondary | ICD-10-CM | POA: Diagnosis not present

## 2017-09-23 DIAGNOSIS — Z1283 Encounter for screening for malignant neoplasm of skin: Secondary | ICD-10-CM | POA: Diagnosis not present

## 2018-01-12 DIAGNOSIS — E7849 Other hyperlipidemia: Secondary | ICD-10-CM | POA: Diagnosis not present

## 2018-01-12 DIAGNOSIS — E559 Vitamin D deficiency, unspecified: Secondary | ICD-10-CM | POA: Diagnosis not present

## 2018-01-12 DIAGNOSIS — I10 Essential (primary) hypertension: Secondary | ICD-10-CM | POA: Diagnosis not present

## 2018-01-12 DIAGNOSIS — Z125 Encounter for screening for malignant neoplasm of prostate: Secondary | ICD-10-CM | POA: Diagnosis not present

## 2018-01-12 DIAGNOSIS — R82998 Other abnormal findings in urine: Secondary | ICD-10-CM | POA: Diagnosis not present

## 2018-01-12 DIAGNOSIS — R7301 Impaired fasting glucose: Secondary | ICD-10-CM | POA: Diagnosis not present

## 2018-01-19 DIAGNOSIS — Z6823 Body mass index (BMI) 23.0-23.9, adult: Secondary | ICD-10-CM | POA: Diagnosis not present

## 2018-01-19 DIAGNOSIS — E559 Vitamin D deficiency, unspecified: Secondary | ICD-10-CM | POA: Diagnosis not present

## 2018-01-19 DIAGNOSIS — R69 Illness, unspecified: Secondary | ICD-10-CM | POA: Diagnosis not present

## 2018-01-19 DIAGNOSIS — H9193 Unspecified hearing loss, bilateral: Secondary | ICD-10-CM | POA: Diagnosis not present

## 2018-01-19 DIAGNOSIS — R7301 Impaired fasting glucose: Secondary | ICD-10-CM | POA: Diagnosis not present

## 2018-01-19 DIAGNOSIS — E7849 Other hyperlipidemia: Secondary | ICD-10-CM | POA: Diagnosis not present

## 2018-01-19 DIAGNOSIS — D126 Benign neoplasm of colon, unspecified: Secondary | ICD-10-CM | POA: Diagnosis not present

## 2018-01-19 DIAGNOSIS — Z0000 Encounter for general adult medical examination without abnormal findings: Secondary | ICD-10-CM | POA: Diagnosis not present

## 2018-01-19 DIAGNOSIS — I10 Essential (primary) hypertension: Secondary | ICD-10-CM | POA: Diagnosis not present

## 2018-01-19 DIAGNOSIS — Z1389 Encounter for screening for other disorder: Secondary | ICD-10-CM | POA: Diagnosis not present

## 2018-02-01 DIAGNOSIS — R972 Elevated prostate specific antigen [PSA]: Secondary | ICD-10-CM | POA: Diagnosis not present

## 2018-04-16 DIAGNOSIS — R972 Elevated prostate specific antigen [PSA]: Secondary | ICD-10-CM | POA: Diagnosis not present

## 2018-04-22 DIAGNOSIS — R972 Elevated prostate specific antigen [PSA]: Secondary | ICD-10-CM | POA: Diagnosis not present

## 2018-04-27 DIAGNOSIS — Z23 Encounter for immunization: Secondary | ICD-10-CM | POA: Diagnosis not present

## 2018-05-20 DIAGNOSIS — Z0100 Encounter for examination of eyes and vision without abnormal findings: Secondary | ICD-10-CM | POA: Diagnosis not present

## 2018-06-03 DIAGNOSIS — R972 Elevated prostate specific antigen [PSA]: Secondary | ICD-10-CM | POA: Diagnosis not present

## 2018-06-10 DIAGNOSIS — R972 Elevated prostate specific antigen [PSA]: Secondary | ICD-10-CM | POA: Diagnosis not present

## 2018-06-11 DIAGNOSIS — H18413 Arcus senilis, bilateral: Secondary | ICD-10-CM | POA: Diagnosis not present

## 2018-06-11 DIAGNOSIS — Z961 Presence of intraocular lens: Secondary | ICD-10-CM | POA: Diagnosis not present

## 2018-06-11 DIAGNOSIS — H02831 Dermatochalasis of right upper eyelid: Secondary | ICD-10-CM | POA: Diagnosis not present

## 2018-06-11 DIAGNOSIS — H26493 Other secondary cataract, bilateral: Secondary | ICD-10-CM | POA: Diagnosis not present

## 2018-06-11 DIAGNOSIS — H26491 Other secondary cataract, right eye: Secondary | ICD-10-CM | POA: Diagnosis not present

## 2018-06-29 DIAGNOSIS — H26492 Other secondary cataract, left eye: Secondary | ICD-10-CM | POA: Diagnosis not present

## 2019-01-17 DIAGNOSIS — R82998 Other abnormal findings in urine: Secondary | ICD-10-CM | POA: Diagnosis not present

## 2019-01-17 DIAGNOSIS — Z125 Encounter for screening for malignant neoplasm of prostate: Secondary | ICD-10-CM | POA: Diagnosis not present

## 2019-01-17 DIAGNOSIS — R7301 Impaired fasting glucose: Secondary | ICD-10-CM | POA: Diagnosis not present

## 2019-01-17 DIAGNOSIS — E7849 Other hyperlipidemia: Secondary | ICD-10-CM | POA: Diagnosis not present

## 2019-01-17 DIAGNOSIS — I10 Essential (primary) hypertension: Secondary | ICD-10-CM | POA: Diagnosis not present

## 2019-01-24 DIAGNOSIS — D126 Benign neoplasm of colon, unspecified: Secondary | ICD-10-CM | POA: Diagnosis not present

## 2019-01-24 DIAGNOSIS — Z0000 Encounter for general adult medical examination without abnormal findings: Secondary | ICD-10-CM | POA: Diagnosis not present

## 2019-01-24 DIAGNOSIS — Z1331 Encounter for screening for depression: Secondary | ICD-10-CM | POA: Diagnosis not present

## 2019-01-24 DIAGNOSIS — H9193 Unspecified hearing loss, bilateral: Secondary | ICD-10-CM | POA: Diagnosis not present

## 2019-01-24 DIAGNOSIS — R69 Illness, unspecified: Secondary | ICD-10-CM | POA: Diagnosis not present

## 2019-01-24 DIAGNOSIS — R7301 Impaired fasting glucose: Secondary | ICD-10-CM | POA: Diagnosis not present

## 2019-01-24 DIAGNOSIS — E785 Hyperlipidemia, unspecified: Secondary | ICD-10-CM | POA: Diagnosis not present

## 2019-01-24 DIAGNOSIS — I10 Essential (primary) hypertension: Secondary | ICD-10-CM | POA: Diagnosis not present

## 2019-01-24 DIAGNOSIS — D7589 Other specified diseases of blood and blood-forming organs: Secondary | ICD-10-CM | POA: Diagnosis not present

## 2019-01-24 DIAGNOSIS — E559 Vitamin D deficiency, unspecified: Secondary | ICD-10-CM | POA: Diagnosis not present

## 2019-01-25 ENCOUNTER — Other Ambulatory Visit: Payer: Self-pay | Admitting: Internal Medicine

## 2019-01-25 DIAGNOSIS — E785 Hyperlipidemia, unspecified: Secondary | ICD-10-CM

## 2019-02-03 DIAGNOSIS — Z961 Presence of intraocular lens: Secondary | ICD-10-CM | POA: Diagnosis not present

## 2019-02-03 DIAGNOSIS — H524 Presbyopia: Secondary | ICD-10-CM | POA: Diagnosis not present

## 2019-02-04 ENCOUNTER — Ambulatory Visit
Admission: RE | Admit: 2019-02-04 | Discharge: 2019-02-04 | Disposition: A | Discharge status: Home / Self Care | Payer: No Typology Code available for payment source | Source: Ambulatory Visit | Attending: Internal Medicine | Admitting: Internal Medicine

## 2019-02-04 DIAGNOSIS — E785 Hyperlipidemia, unspecified: Secondary | ICD-10-CM

## 2019-02-04 IMAGING — CT CT HEART SCORING
3 series · 14 of 20 positions shown, 16 images · non-contrast
Comparison: None.

CLINICAL DATA: 77-year-old Caucasian male with history of
hypertension.

EXAM:
CT HEART FOR CALCIUM SCORING
TECHNIQUE: CT heart was performed using prospective ECG gating.
A non-contrast exam for calcium scoring was performed.
Note that this exam targets the heart and the chest was not imaged
in its entirety.

[Series 2: calcium scoring 2.00 qr36 bestdiast 68% · axial · 0.38mm/px · z∈[+1522,+1594]mm · 4 of 60 slices shown]
[im 12/60  vessel]
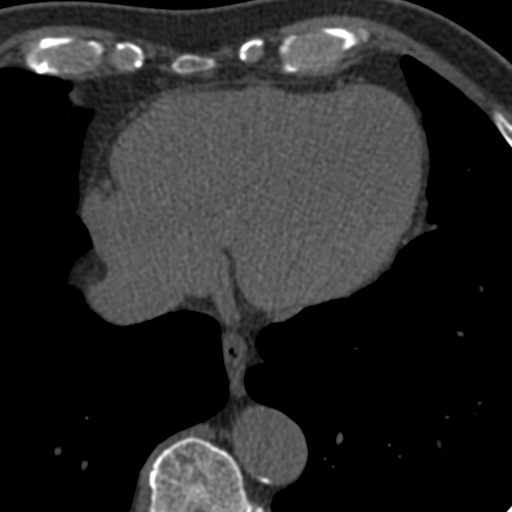
[im 24/60  vessel]
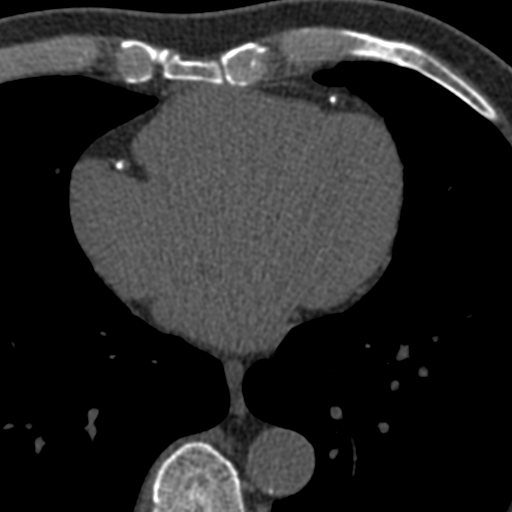
[im 36/60  vessel]
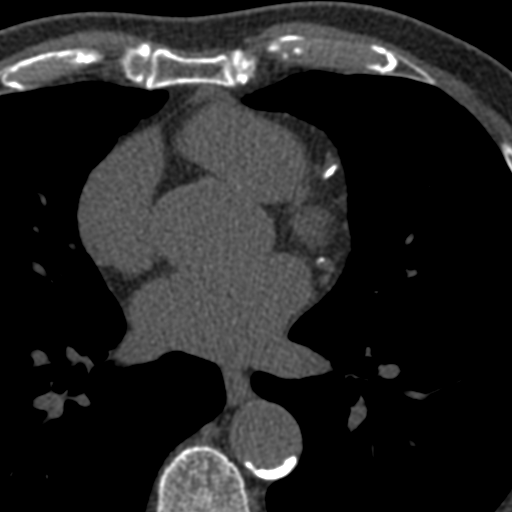
[im 48/60  vessel]
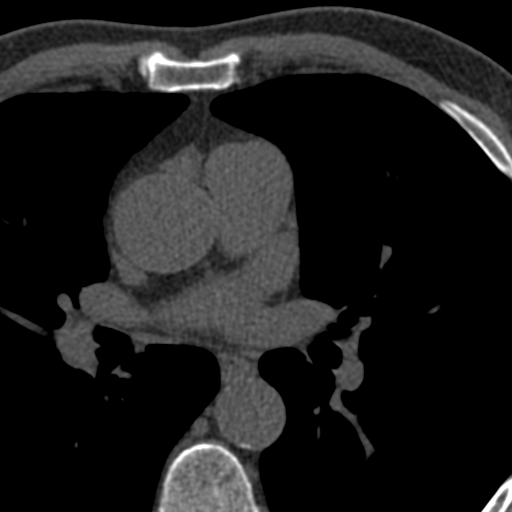

[Series 3: calcium scoring 2.00 br40 bestdiast 68% fov · axial · 0.63mm/px · z∈[+1518,+1598]mm · 5 of 60 slices shown, 7 images]
[im 10/60  vessel]
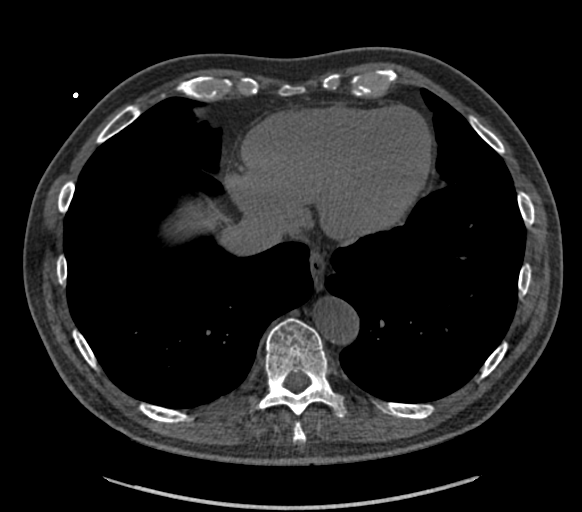
[im 10/60  lung]
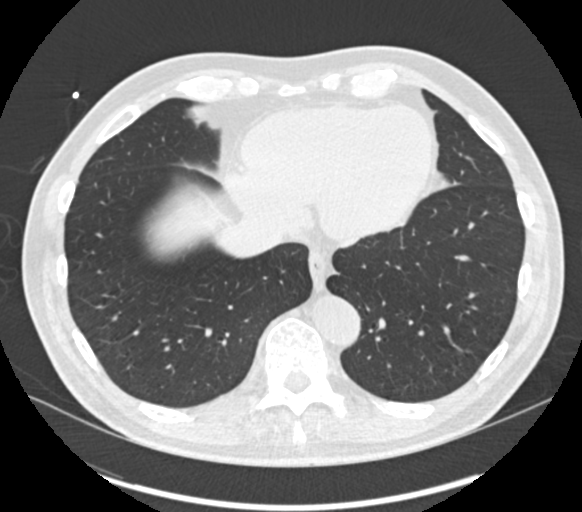
[im 20/60  vessel]
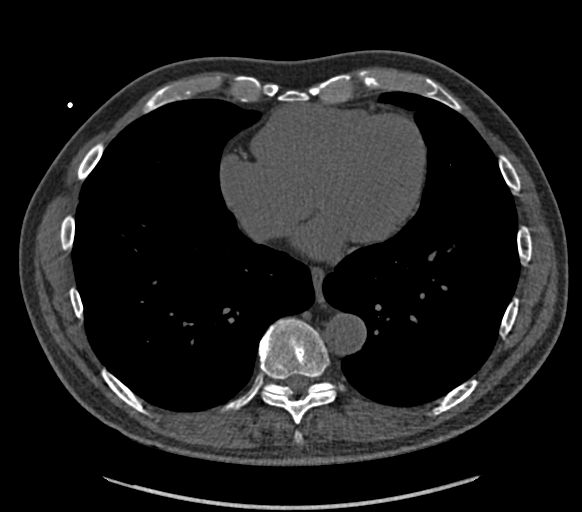
[im 30/60  vessel]
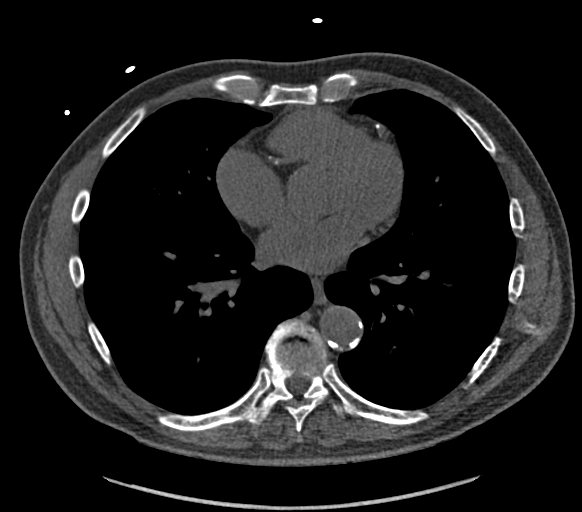
[im 40/60  vessel]
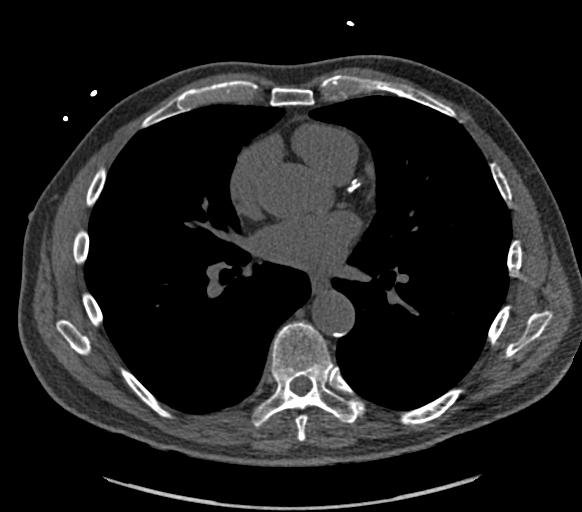
[im 50/60  vessel]
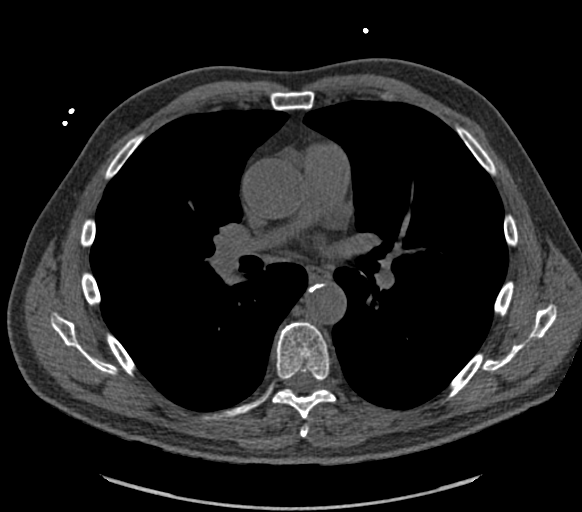
[im 50/60  lung]
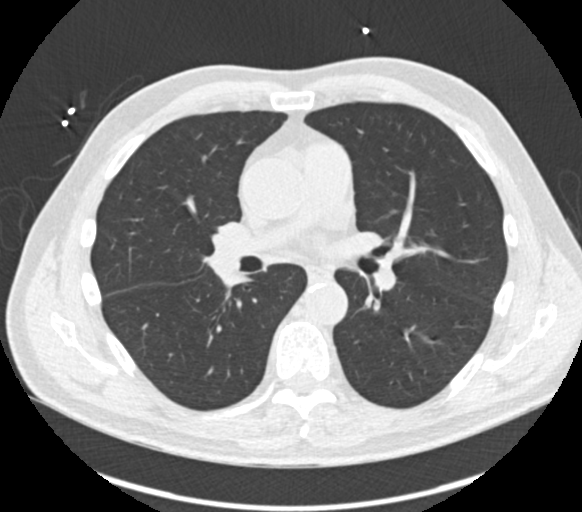

[Series 9: calcium scoring 2.00 br60 bestdiast 68% fov · axial · 0.67mm/px · z∈[+1518,+1598]mm · 5 of 60 slices shown]
[im 10/60  vessel]
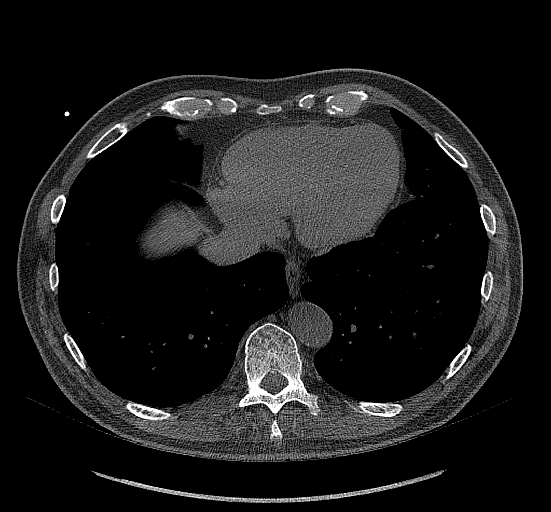
[im 20/60  vessel]
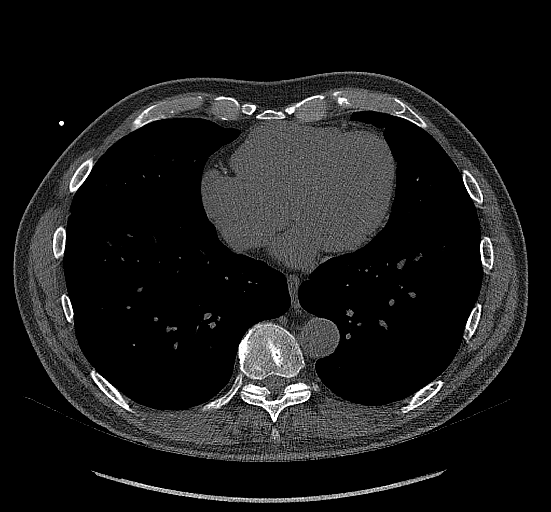
[im 30/60  vessel]
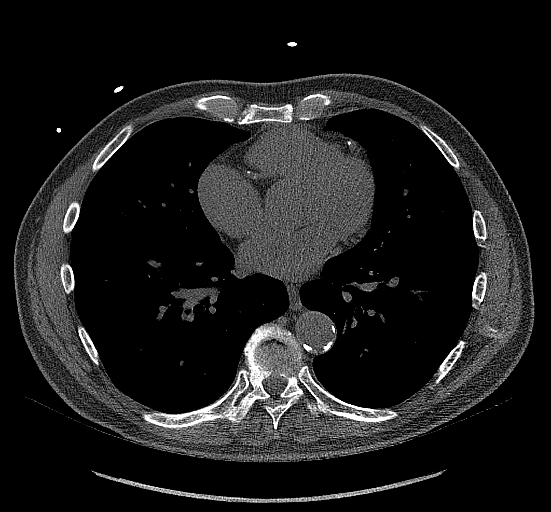
[im 40/60  vessel]
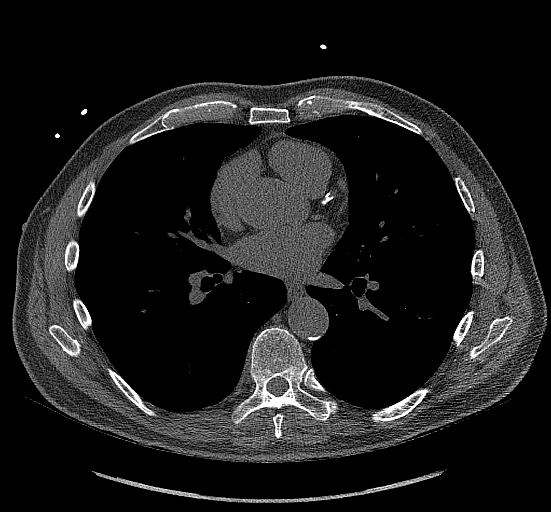
[im 50/60  vessel]
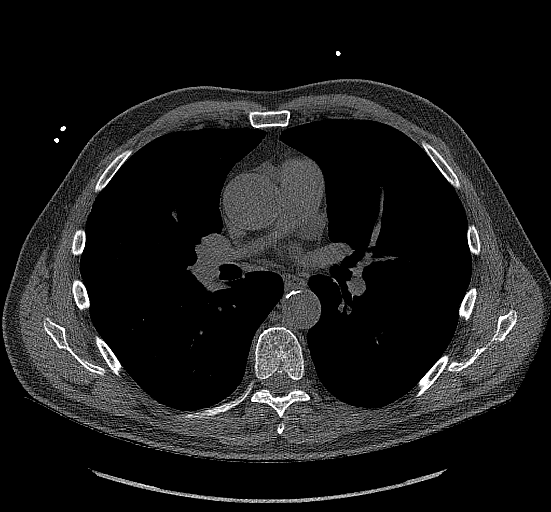

[14 of 20 positions shown; findings below may reference images not displayed]

FINDINGS: Technical quality: Good.

CORONARY CALCIUM

Total Agatston Score: 846

[HOSPITAL] percentile: 75th

OTHER FINDINGS:

Aortic atherosclerosis. Within the visualized portions of the thorax
there are no suspicious appearing pulmonary nodules or masses, there
is no acute consolidative airspace disease, no pleural effusions, no
pneumothorax and no lymphadenopathy. Visualized portions of the
upper abdomen are unremarkable. There are no aggressive appearing
lytic or blastic lesions noted in the visualized portions of the
skeleton.
IMPRESSION: 1. Patient's total coronary artery calcium score is 846 which is
75th percentile for patient's of matched age, gender and
race/ethnicity. Please note that although the presence of coronary
artery calcium documents the presence of coronary artery disease,
the severity of this disease and any potential stenosis cannot be
assessed on this noncontrast CT examination. Assessment for
potential risk factor modification, dietary therapy or pharmacologic
therapy may be warranted, if clinically indicated.
2.  Aortic Atherosclerosis ([S1]-[S1]).

## 2019-02-24 DIAGNOSIS — K5730 Diverticulosis of large intestine without perforation or abscess without bleeding: Secondary | ICD-10-CM | POA: Diagnosis not present

## 2019-02-24 DIAGNOSIS — K635 Polyp of colon: Secondary | ICD-10-CM | POA: Diagnosis not present

## 2019-02-24 DIAGNOSIS — Z86010 Personal history of colonic polyps: Secondary | ICD-10-CM | POA: Diagnosis not present

## 2019-03-01 DIAGNOSIS — K635 Polyp of colon: Secondary | ICD-10-CM | POA: Diagnosis not present

## 2019-03-23 DIAGNOSIS — R69 Illness, unspecified: Secondary | ICD-10-CM | POA: Diagnosis not present

## 2019-03-29 DIAGNOSIS — D225 Melanocytic nevi of trunk: Secondary | ICD-10-CM | POA: Diagnosis not present

## 2019-03-29 DIAGNOSIS — Z1283 Encounter for screening for malignant neoplasm of skin: Secondary | ICD-10-CM | POA: Diagnosis not present

## 2019-03-29 DIAGNOSIS — D485 Neoplasm of uncertain behavior of skin: Secondary | ICD-10-CM | POA: Diagnosis not present

## 2019-05-10 NOTE — Progress Notes (Signed)
Cardiology Office Note:    Date:  05/11/2019   ID:  Patrick Macias, DOB 23-Jan-1941, MRN [id number]  PCP:  Tommy Medal, MD  Cardiologist:  No primary care provider on file.   Referring MD: Crist Infante, MD   Chief Complaint  Patient presents with  . Coronary Artery Disease    Asymptomatic.    History of Present Illness:    Patrick Macias is a 78 y.o. male with a hx of CAD and referred by Dr. Joylene Draft for consultation because of a high risk coronary calcium score greater than 800.  Mr. Woulfe is a pleasant gentleman.  He seems to have some mild difficulty with memory.  He denies any chest discomfort or symptoms suggestive of heart disease.  He specifically denies orthopnea, PND, dyspnea on exertion, peripheral edema, palpitations, and syncope.  He does not exercise/maintain activity as he used to 3 years ago.  He stopped going to his gym and has become more sedentary.  He states that this was not because of any particular symptoms that limited activity.  Past Medical History:  Diagnosis Date  . Anxiety   . BPH (benign prostatic hyperplasia)   . Cerumen impaction   . Dyslipidemia   . H/O lithotripsy   . Hearing decreased, bilateral   . Hx of colonic polyps   . Hypertension   . IFG (impaired fasting glucose)   . Macrocytosis   . Nephrolithiasis   . Vitamin D deficiency     Past Surgical History:  Procedure Laterality Date  . CATARACT EXTRACTION, BILATERAL    . PROSTATECTOMY      Current Medications: Current Meds  Medication Sig  . Cholecalciferol (VITAMIN D-3) 125 MCG (5000 UT) TABS Take 1 tablet by mouth once a week.  . citalopram (CELEXA) 20 MG tablet Take 20 mg by mouth daily.  Marland Kitchen lisinopril-hydrochlorothiazide (ZESTORETIC) 20-25 MG tablet Take 1 tablet by mouth daily.  . rosuvastatin (CRESTOR) 20 MG tablet Take 20 mg by mouth at bedtime.     Allergies:   Patient has no known allergies.   Social History   Socioeconomic History  . Marital status: Married   Spouse name: Not on file  . Number of children: Not on file  . Years of education: Not on file  . Highest education level: Not on file  Occupational History  . Not on file  Social Needs  . Financial resource strain: Not on file  . Food insecurity    Worry: Not on file    Inability: Not on file  . Transportation needs    Medical: Not on file    Non-medical: Not on file  Tobacco Use  . Smoking status: Never Smoker  . Smokeless tobacco: Never Used  Substance and Sexual Activity  . Alcohol use: Yes    Comment: Rarely  . Drug use: Never  . Sexual activity: Not on file  Lifestyle  . Physical activity    Days per week: Not on file    Minutes per session: Not on file  . Stress: Not on file  Relationships  . Social Herbalist on phone: Not on file    Gets together: Not on file    Attends religious service: Not on file    Active member of club or organization: Not on file    Attends meetings of clubs or organizations: Not on file    Relationship status: Not on file  Other Topics Concern  . Not on file  Social History Narrative  . Not on file     Family History: The patient's family history is negative for Sudden death, Hypertension, Hyperlipidemia, Diabetes, and Heart attack.  ROS:   Please see the history of present illness.    He has never been a smoker.  There is no family history of vascular disease.  He sleeps well.  He is on a statin and states that this was started recently in response to his cholesterol level.  All other systems reviewed and are negative.  EKGs/Labs/Other Studies Reviewed:    The following studies were reviewed today: CT cardiac calcium score August 2020: IMPRESSION: 1. Patient's total coronary artery calcium score is 846 which is 75th percentile for patient's of matched age, gender and race/ethnicity. Please note that although the presence of coronary artery calcium documents the presence of coronary artery disease, the severity of  this disease and any potential stenosis cannot be assessed on this noncontrast CT examination. Assessment for potential risk factor modification, dietary therapy or pharmacologic therapy may be warranted, if clinically indicated. 2.  Aortic Atherosclerosis (ICD10-I70.0).  EKG:  EKG normal sinus rhythm, with normal appearance.  Recent Labs: No results found for requested labs within last 8760 hours.  Recent Lipid Panel No results found for: CHOL, TRIG, HDL, CHOLHDL, VLDL, LDLCALC, LDLDIRECT  Physical Exam:    VS:  BP 132/62   Pulse (!) 53   Ht 6' (1.829 m)   Wt 173 lb 1.9 oz (78.5 kg)   SpO2 99%   BMI 23.48 kg/m     Wt Readings from Last 3 Encounters:  05/11/19 173 lb 1.9 oz (78.5 kg)  08/02/13 175 lb (79.4 kg)     GEN: Slender and age-appropriate in appearance. No acute distress HEENT: Normal NECK: No JVD. LYMPHATICS: No lymphadenopathy CARDIAC: RRR without murmur, gallop, or edema. VASCULAR:  Normal Pulses. No bruits. RESPIRATORY:  Clear to auscultation without rales, wheezing or rhonchi  ABDOMEN: Soft, non-tender, non-distended, No pulsatile mass, MUSCULOSKELETAL: No deformity  SKIN: Warm and dry NEUROLOGIC:  Alert and oriented x 3 PSYCHIATRIC:  Normal affect   ASSESSMENT:    1. Coronary artery disease involving native coronary artery of native heart without angina pectoris   2. Agatston coronary artery calcium score greater than 400   3. Hyperlipidemia LDL goal <70   4. Educated about COVID-19 virus infection    PLAN:    In order of problems listed above:  1. Primary event prevention is discussed in great detail.  He should take a coated 81 mg aspirin per day.  He should get 150 minutes of moderate activity per week.  He should have LDL lowering to less than 70 and preferably in the 55 range.  He should have glycemic control assessed.  He does not smoke.  He gets greater than 6 hours of sleep per night. 2. The coronary calcium score is high risk, greater than  800.  We discussed the potential implications.  We will plan to perform a Lexiscan myocardial perfusion study to exclude the possibility of high-grade obstructive disease that has collateralized.  He had a stress test done greater than 15 years ago when he was having exertional chest tightness but was reassured when the test was "normal". 3. He is on statin therapy that was in reaction to the recent blood work and coronary calcium score.  My assumption is follow-up LDL will be done by Dr. Joylene Draft.  The most recent LDL was 90 on July 20 which is when  the patient says he started statin therapy. 4. The W3's is discussed relative to COVID-19 prevention.  Overall education and awareness concerning primary/secondary risk prevention was discussed in detail: LDL less than 70, hemoglobin A1c less than 7, blood pressure target less than 130/80 mmHg, >150 minutes of moderate aerobic activity per week, avoidance of smoking, weight control (via diet and exercise), and continued surveillance/management of/for obstructive sleep apnea.    Medication Adjustments/Labs and Tests Ordered: Current medicines are reviewed at length with the patient today.  Concerns regarding medicines are outlined above.  Orders Placed This Encounter  Procedures  . MYOCARDIAL PERFUSION IMAGING  . EKG 12-Lead   Meds ordered this encounter  Medications  . aspirin EC 81 MG tablet    Sig: Take 1 tablet (81 mg total) by mouth daily.    Dispense:  90 tablet    Refill:  3    Patient Instructions  Medication Instructions:  1) START Aspirin 81mg  once daily  *If you need a refill on your cardiac medications before your next appointment, please call your pharmacy*  Lab Work: None If you have labs (blood work) drawn today and your tests are completely normal, you will receive your results only by: Marland Kitchen MyChart Message (if you have MyChart) OR . A paper copy in the mail If you have any lab test that is abnormal or we need to change your  treatment, we will call you to review the results.  Testing/Procedures: Your physician has requested that you have a lexiscan myoview. For further information please visit [email or web address]. Please follow instruction sheet, as given.   Follow-Up: At [facility], you and your health needs are our priority.  As part of our continuing mission to provide you with exceptional heart care, we have created designated Provider Care Teams.  These Care Teams include your primary Cardiologist (physician) and Advanced Practice Providers (APPs -  Physician Assistants and Nurse Practitioners) who all work together to provide you with the care you need, when you need it.  Your next appointment:   12 months  The format for your next appointment:   In Person  Provider:   You may see Dr. Daneen Schick or one of the following Advanced Practice Providers on your designated Care Team:    Truitt Merle, NP  Cecilie Kicks, NP  Kathyrn Drown, NP   Other Instructions      Signed, Sinclair Grooms, MD  05/11/2019 10:50 AM    [place]

## 2019-05-11 ENCOUNTER — Encounter: Payer: Self-pay | Admitting: *Deleted

## 2019-05-11 ENCOUNTER — Encounter: Payer: Self-pay | Admitting: Interventional Cardiology

## 2019-05-11 ENCOUNTER — Other Ambulatory Visit: Payer: Self-pay

## 2019-05-11 ENCOUNTER — Ambulatory Visit: Payer: Medicare HMO | Admitting: Interventional Cardiology

## 2019-05-11 VITALS — BP 132/62 | HR 53 | Ht 72.0 in | Wt 173.1 lb

## 2019-05-11 DIAGNOSIS — R931 Abnormal findings on diagnostic imaging of heart and coronary circulation: Secondary | ICD-10-CM | POA: Diagnosis not present

## 2019-05-11 DIAGNOSIS — Z7189 Other specified counseling: Secondary | ICD-10-CM | POA: Diagnosis not present

## 2019-05-11 DIAGNOSIS — E785 Hyperlipidemia, unspecified: Secondary | ICD-10-CM

## 2019-05-11 DIAGNOSIS — I2510 Atherosclerotic heart disease of native coronary artery without angina pectoris: Secondary | ICD-10-CM | POA: Diagnosis not present

## 2019-05-11 MED ORDER — ASPIRIN EC 81 MG PO TBEC: 81.0000 mg | DELAYED_RELEASE_TABLET | Freq: Every day | ORAL | 3 refills | Status: AC

## 2019-05-11 NOTE — Patient Instructions (Signed)
Medication Instructions:  1) START Aspirin 81mg  once daily  *If you need a refill on your cardiac medications before your next appointment, please call your pharmacy*  Lab Work: None If you have labs (blood work) drawn today and your tests are completely normal, you will receive your results only by: Marland Kitchen MyChart Message (if you have MyChart) OR . A paper copy in the mail If you have any lab test that is abnormal or we need to change your treatment, we will call you to review the results.  Testing/Procedures: Your physician has requested that you have a lexiscan myoview. For further information please visit [email or web address]. Please follow instruction sheet, as given.   Follow-Up: At [facility], you and your health needs are our priority.  As part of our continuing mission to provide you with exceptional heart care, we have created designated Provider Care Teams.  These Care Teams include your primary Cardiologist (physician) and Advanced Practice Providers (APPs -  Physician Assistants and Nurse Practitioners) who all work together to provide you with the care you need, when you need it.  Your next appointment:   12 months  The format for your next appointment:   In Person  Provider:   You may see Dr. Daneen Schick or one of the following Advanced Practice Providers on your designated Care Team:    Truitt Merle, NP  Cecilie Kicks, NP  Kathyrn Drown, NP   Other Instructions

## 2019-05-12 ENCOUNTER — Telehealth (HOSPITAL_COMMUNITY): Payer: Self-pay

## 2019-05-12 NOTE — Telephone Encounter (Signed)
Instructions were left on the patient's answering machine. Asked to call back with any questions. S.Wendelyn Kiesling EMTP 

## 2019-05-17 ENCOUNTER — Ambulatory Visit (HOSPITAL_COMMUNITY): Payer: Medicare HMO | Attending: Internal Medicine

## 2019-05-17 ENCOUNTER — Other Ambulatory Visit: Payer: Self-pay

## 2019-05-17 DIAGNOSIS — I2510 Atherosclerotic heart disease of native coronary artery without angina pectoris: Secondary | ICD-10-CM | POA: Insufficient documentation

## 2019-05-17 LAB — MYOCARDIAL PERFUSION IMAGING
LV dias vol: 89 mL (ref 62–150)
LV sys vol: 32 mL
Peak HR: 80 {beats}/min
Rest HR: 45 {beats}/min
SDS: 0
SRS: 0
SSS: 0
TID: 0.91

## 2019-05-20 ENCOUNTER — Telehealth: Payer: Self-pay | Admitting: Interventional Cardiology

## 2019-07-28 ENCOUNTER — Ambulatory Visit: Payer: Medicare HMO

## 2019-08-04 ENCOUNTER — Ambulatory Visit: Payer: Medicare HMO | Attending: Internal Medicine

## 2019-08-04 DIAGNOSIS — Z23 Encounter for immunization: Secondary | ICD-10-CM | POA: Insufficient documentation

## 2019-08-04 NOTE — Progress Notes (Signed)
   Covid-19 Vaccination Clinic  Name:  HAMADI GOLDMANN    MRN: [id number] DOB: Jan 31, 1941  08/04/2019  Mr. Bradway was observed post Covid-19 immunization for 15 minutes without incidence. He was provided with Vaccine Information Sheet and instruction to access the V-Safe system.   Mr. Socarras was instructed to call 911 with any severe reactions post vaccine: Marland Kitchen Difficulty breathing  . Swelling of your face and throat  . A fast heartbeat  . A bad rash all over your body  . Dizziness and weakness    Immunizations Administered    Name Date Dose VIS Date Route   Pfizer COVID-19 Vaccine 08/04/2019  4:31 PM 0.3 mL 06/10/2019 Intramuscular   Manufacturer: [place]   Lot: [id number]   [place]: [id number]

## 2019-08-12 DIAGNOSIS — L570 Actinic keratosis: Secondary | ICD-10-CM | POA: Diagnosis not present

## 2019-08-12 DIAGNOSIS — D225 Melanocytic nevi of trunk: Secondary | ICD-10-CM | POA: Diagnosis not present

## 2019-08-12 DIAGNOSIS — X32XXXD Exposure to sunlight, subsequent encounter: Secondary | ICD-10-CM | POA: Diagnosis not present

## 2019-08-12 DIAGNOSIS — L905 Scar conditions and fibrosis of skin: Secondary | ICD-10-CM | POA: Diagnosis not present

## 2019-08-29 ENCOUNTER — Ambulatory Visit: Payer: Medicare HMO | Attending: Internal Medicine

## 2019-08-29 DIAGNOSIS — Z23 Encounter for immunization: Secondary | ICD-10-CM | POA: Insufficient documentation

## 2019-08-29 NOTE — Progress Notes (Signed)
   Covid-19 Vaccination Clinic  Name:  Patrick Macias    MRN: [id number] DOB: [birth year]  08/29/2019  [name] was observed post Covid-19 immunization for 15 minutes without incidence. He was provided with Vaccine Information Sheet and instruction to access the V-Safe system.   [name] was instructed to call 911 with any severe reactions post vaccine: Marland Kitchen Difficulty breathing  . Swelling of your face and throat  . A fast heartbeat  . A bad rash all over your body  . Dizziness and weakness    Immunizations Administered    Name Date Dose VIS Date Route   Pfizer COVID-19 Vaccine 08/29/2019  3:24 PM 0.3 mL 06/10/2019 Intramuscular   Manufacturer: [place]   Lot: [id number]   [place]: [id number]

## 2020-02-09 DIAGNOSIS — B9689 Other specified bacterial agents as the cause of diseases classified elsewhere: Secondary | ICD-10-CM | POA: Diagnosis not present

## 2020-02-09 DIAGNOSIS — J0141 Acute recurrent pansinusitis: Secondary | ICD-10-CM | POA: Diagnosis not present

## 2020-02-09 DIAGNOSIS — J208 Acute bronchitis due to other specified organisms: Secondary | ICD-10-CM | POA: Diagnosis not present

## 2020-02-09 DIAGNOSIS — J060 Acute laryngopharyngitis: Secondary | ICD-10-CM | POA: Diagnosis not present

## 2020-02-09 DIAGNOSIS — R067 Sneezing: Secondary | ICD-10-CM | POA: Diagnosis not present

## 2020-02-09 DIAGNOSIS — R05 Cough: Secondary | ICD-10-CM | POA: Diagnosis not present

## 2020-02-09 DIAGNOSIS — R519 Headache, unspecified: Secondary | ICD-10-CM | POA: Diagnosis not present

## 2020-02-09 DIAGNOSIS — Z20822 Contact with and (suspected) exposure to covid-19: Secondary | ICD-10-CM | POA: Diagnosis not present

## 2020-02-09 DIAGNOSIS — R0989 Other specified symptoms and signs involving the circulatory and respiratory systems: Secondary | ICD-10-CM | POA: Diagnosis not present

## 2020-02-15 DIAGNOSIS — J209 Acute bronchitis, unspecified: Secondary | ICD-10-CM | POA: Diagnosis not present

## 2020-02-15 DIAGNOSIS — R05 Cough: Secondary | ICD-10-CM | POA: Diagnosis not present

## 2020-02-15 DIAGNOSIS — Z1152 Encounter for screening for COVID-19: Secondary | ICD-10-CM | POA: Diagnosis not present

## 2020-02-22 DIAGNOSIS — R413 Other amnesia: Secondary | ICD-10-CM | POA: Diagnosis not present

## 2020-02-22 DIAGNOSIS — R7301 Impaired fasting glucose: Secondary | ICD-10-CM | POA: Diagnosis not present

## 2020-02-22 DIAGNOSIS — I10 Essential (primary) hypertension: Secondary | ICD-10-CM | POA: Diagnosis not present

## 2020-02-22 DIAGNOSIS — R69 Illness, unspecified: Secondary | ICD-10-CM | POA: Diagnosis not present

## 2020-02-23 ENCOUNTER — Other Ambulatory Visit: Payer: Self-pay | Admitting: Internal Medicine

## 2020-02-23 DIAGNOSIS — R413 Other amnesia: Secondary | ICD-10-CM

## 2020-03-02 DIAGNOSIS — E785 Hyperlipidemia, unspecified: Secondary | ICD-10-CM | POA: Diagnosis not present

## 2020-03-02 DIAGNOSIS — E559 Vitamin D deficiency, unspecified: Secondary | ICD-10-CM | POA: Diagnosis not present

## 2020-03-02 DIAGNOSIS — Z125 Encounter for screening for malignant neoplasm of prostate: Secondary | ICD-10-CM | POA: Diagnosis not present

## 2020-03-02 DIAGNOSIS — I10 Essential (primary) hypertension: Secondary | ICD-10-CM | POA: Diagnosis not present

## 2020-03-02 DIAGNOSIS — R7301 Impaired fasting glucose: Secondary | ICD-10-CM | POA: Diagnosis not present

## 2020-03-09 DIAGNOSIS — H9193 Unspecified hearing loss, bilateral: Secondary | ICD-10-CM | POA: Diagnosis not present

## 2020-03-09 DIAGNOSIS — I2510 Atherosclerotic heart disease of native coronary artery without angina pectoris: Secondary | ICD-10-CM | POA: Diagnosis not present

## 2020-03-09 DIAGNOSIS — I700 Atherosclerosis of aorta: Secondary | ICD-10-CM | POA: Diagnosis not present

## 2020-03-09 DIAGNOSIS — R413 Other amnesia: Secondary | ICD-10-CM | POA: Diagnosis not present

## 2020-03-09 DIAGNOSIS — E785 Hyperlipidemia, unspecified: Secondary | ICD-10-CM | POA: Diagnosis not present

## 2020-03-09 DIAGNOSIS — I10 Essential (primary) hypertension: Secondary | ICD-10-CM | POA: Diagnosis not present

## 2020-03-09 DIAGNOSIS — D126 Benign neoplasm of colon, unspecified: Secondary | ICD-10-CM | POA: Diagnosis not present

## 2020-03-09 DIAGNOSIS — R69 Illness, unspecified: Secondary | ICD-10-CM | POA: Diagnosis not present

## 2020-03-09 DIAGNOSIS — Z0000 Encounter for general adult medical examination without abnormal findings: Secondary | ICD-10-CM | POA: Diagnosis not present

## 2020-03-09 DIAGNOSIS — E559 Vitamin D deficiency, unspecified: Secondary | ICD-10-CM | POA: Diagnosis not present

## 2020-03-12 IMAGING — CR DG WRIST 2V*R*
2 series · 2 of 2 positions shown · non-contrast
Comparison: None.

CLINICAL DATA: Wrist pain, recent injury

EXAM:
RIGHT WRIST - 2 VIEW

[wrist pa]
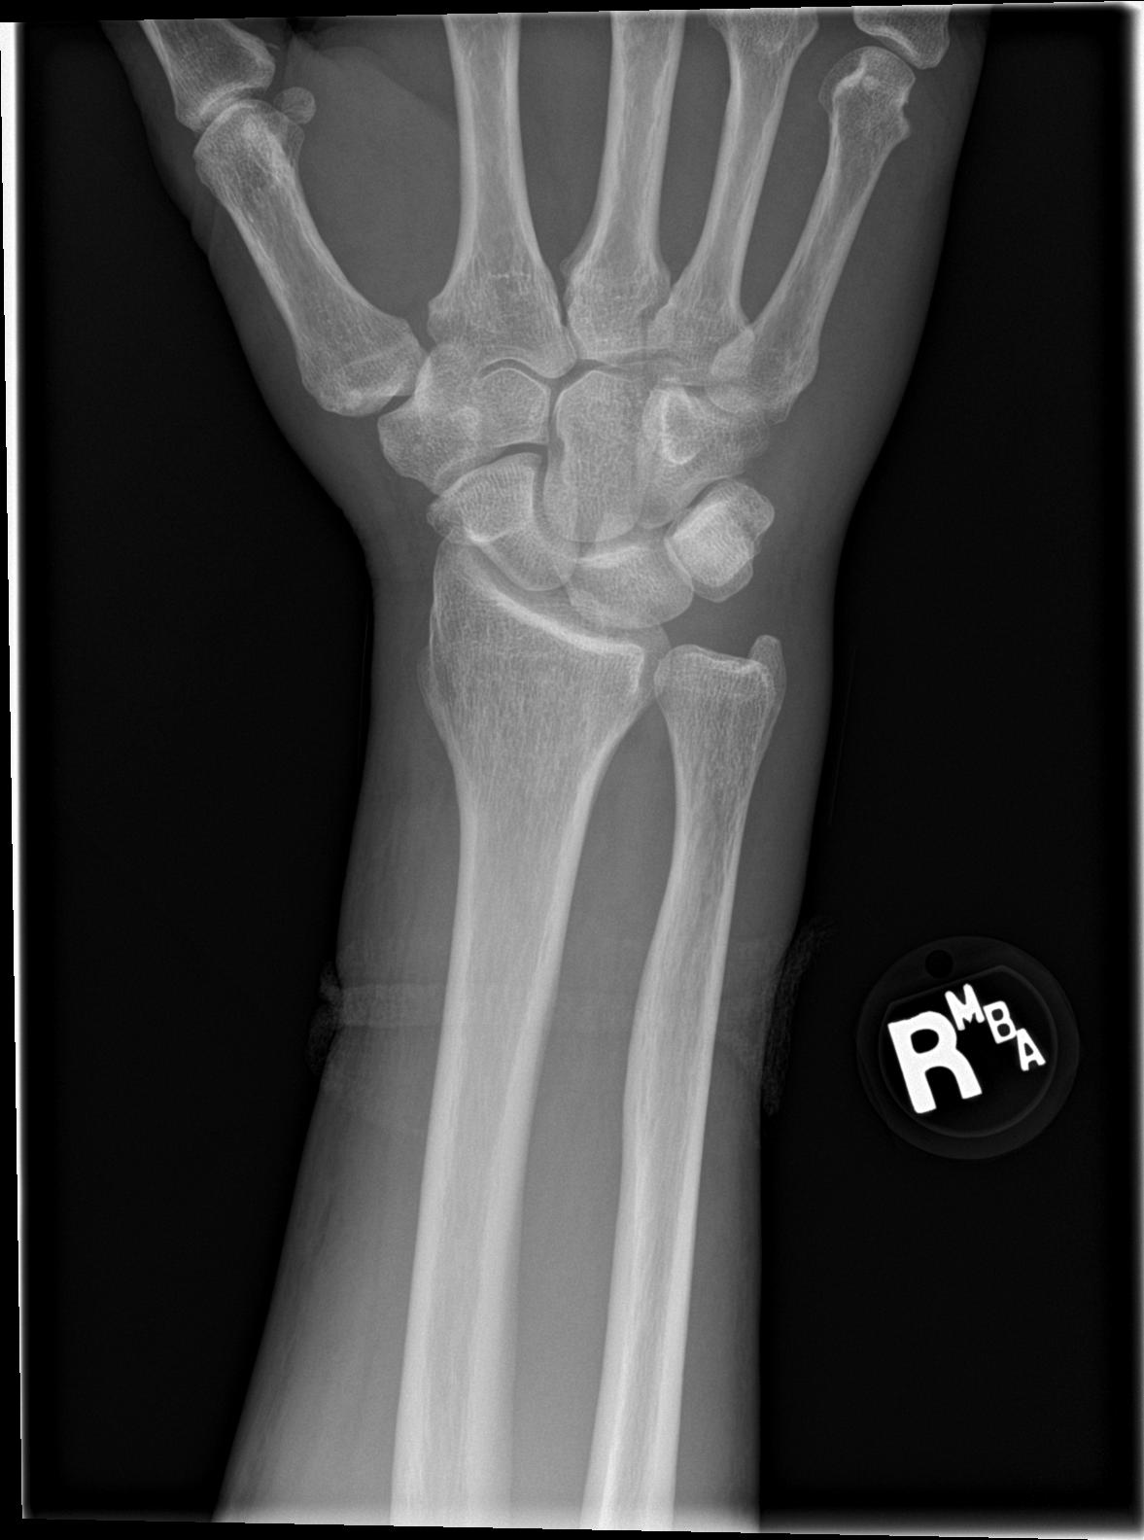

[wrist lat]
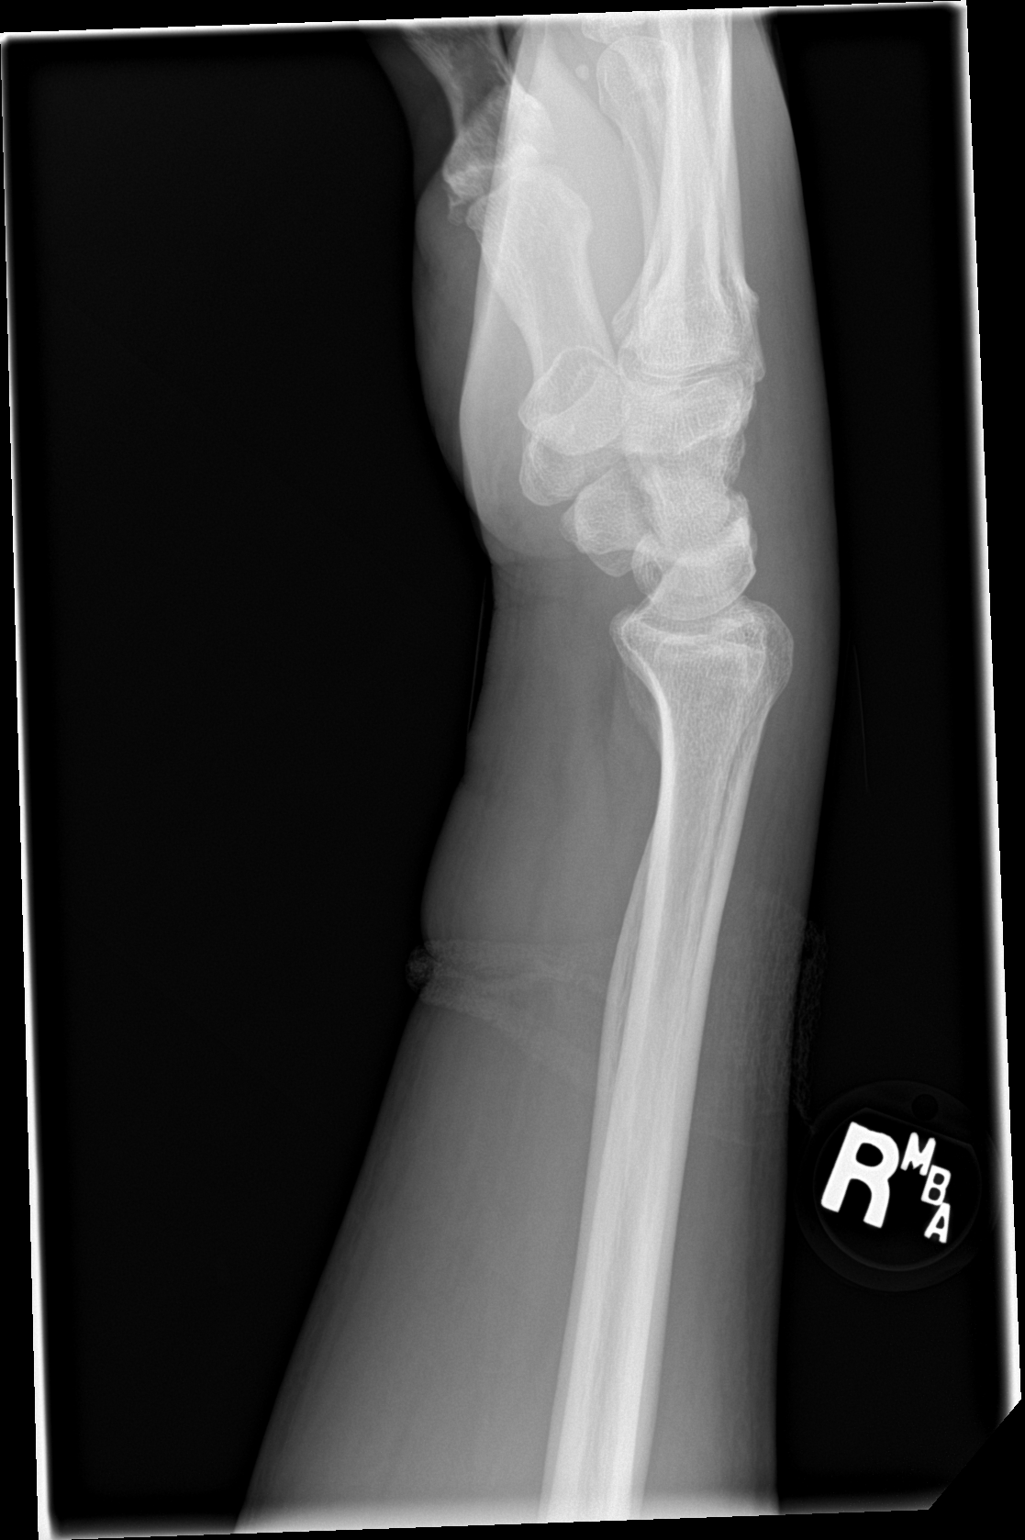

[2 of 2 positions shown; findings below may reference images not displayed]

FINDINGS: There is no evidence of fracture or dislocation. There is no
evidence of arthropathy or other focal bone abnormality. Soft
tissues are unremarkable.
IMPRESSION: No acute osseous finding

## 2020-03-16 DIAGNOSIS — L570 Actinic keratosis: Secondary | ICD-10-CM | POA: Diagnosis not present

## 2020-03-16 DIAGNOSIS — Z1283 Encounter for screening for malignant neoplasm of skin: Secondary | ICD-10-CM | POA: Diagnosis not present

## 2020-03-16 DIAGNOSIS — D485 Neoplasm of uncertain behavior of skin: Secondary | ICD-10-CM | POA: Diagnosis not present

## 2020-03-16 DIAGNOSIS — X32XXXD Exposure to sunlight, subsequent encounter: Secondary | ICD-10-CM | POA: Diagnosis not present

## 2020-03-16 DIAGNOSIS — D225 Melanocytic nevi of trunk: Secondary | ICD-10-CM | POA: Diagnosis not present

## 2020-03-20 ENCOUNTER — Ambulatory Visit
Admission: RE | Admit: 2020-03-20 | Discharge: 2020-03-20 | Disposition: A | Discharge status: Home / Self Care | Payer: Medicare HMO | Source: Ambulatory Visit | Attending: Internal Medicine | Admitting: Internal Medicine

## 2020-03-20 ENCOUNTER — Other Ambulatory Visit: Payer: Self-pay

## 2020-03-20 DIAGNOSIS — I6782 Cerebral ischemia: Secondary | ICD-10-CM | POA: Diagnosis not present

## 2020-03-20 DIAGNOSIS — R413 Other amnesia: Secondary | ICD-10-CM

## 2020-03-20 DIAGNOSIS — G319 Degenerative disease of nervous system, unspecified: Secondary | ICD-10-CM | POA: Diagnosis not present

## 2020-03-20 DIAGNOSIS — I6381 Other cerebral infarction due to occlusion or stenosis of small artery: Secondary | ICD-10-CM | POA: Diagnosis not present

## 2020-03-20 IMAGING — MR MR HEAD W/O CM
10 series · 48 of 48 positions shown · non-contrast
Comparison: No pertinent prior exams are available for comparison.

CLINICAL DATA: Memory loss. Additional history provided by scanning
technologist: Patient reports memory loss, confusion and hearing
loss.

EXAM:
MRI HEAD WITHOUT CONTRAST
TECHNIQUE: Multiplanar, multiecho pulse sequences of the brain and surrounding
structures were obtained without intravenous contrast.

[Series 3: t1_se_sag · sagittal · 5.0mm · 0.45mm/px · 3 of 25 slices shown]
[im 1/25]
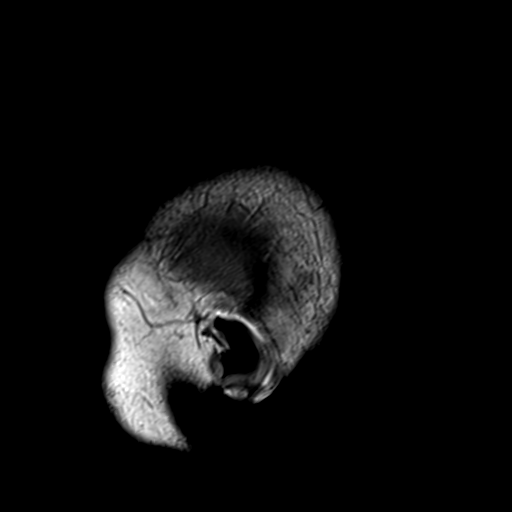
[im 13/25]
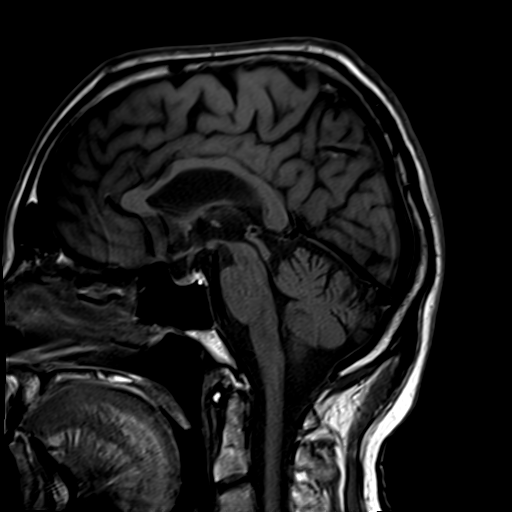
[im 25/25]
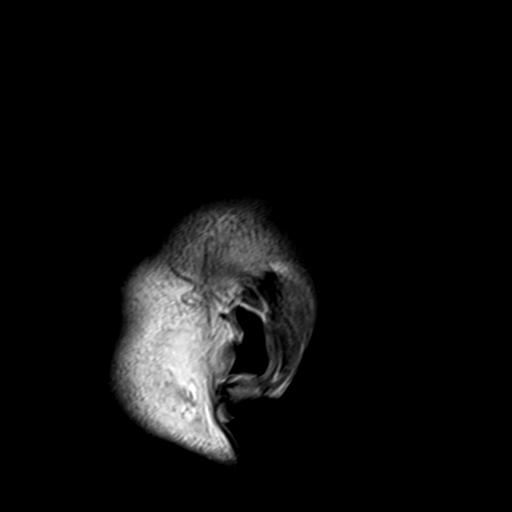

[Series 4: ep2d_diff_3 · axial · 3.0mm · 1.80mm/px · z∈[-12,+144]mm · 9 of 106 slices shown]
[im 1/106]
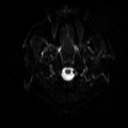
[im 14/106]
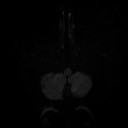
[im 27/106]
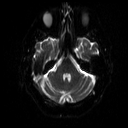
[im 40/106]
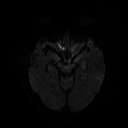
[im 53/106]
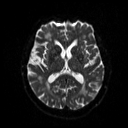
[im 66/106]
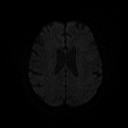
[im 79/106]
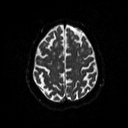
[im 92/106]
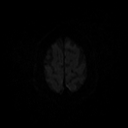
[im 106/106]
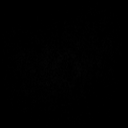

[Series 5: ep2d_diff_3_adc · axial · 3.0mm · 1.80mm/px · z∈[-12,+144]mm · 4 of 51 slices shown]
[im 1/51]
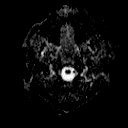
[im 17/51]
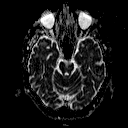
[im 34/51]
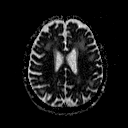
[im 51/51]
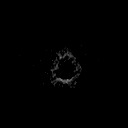

[Series 6: ep2d_diff_cor · coronal · 5.0mm · 1.77mm/px · 5 of 60 slices shown]
[im 1/60]
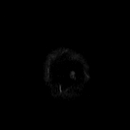
[im 15/60]
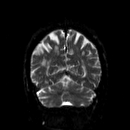
[im 30/60]
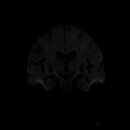
[im 45/60]
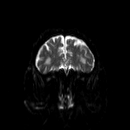
[im 60/60]
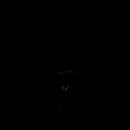

[Series 7: ep2d_diff_cor_adc · coronal · 5.0mm · 1.77mm/px · 2 of 30 slices shown]
[im 1/30]
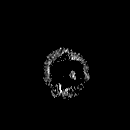
[im 30/30]
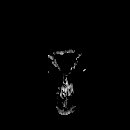

[Series 9: swi_images · axial · 2.0mm · 0.90mm/px · z∈[-4,+138]mm · 6 of 72 slices shown]
[im 1/72]
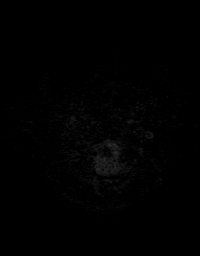
[im 15/72]
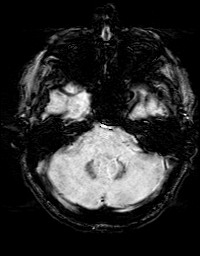
[im 29/72]
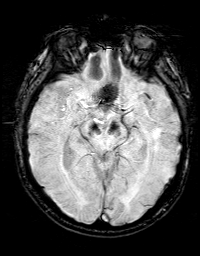
[im 43/72]
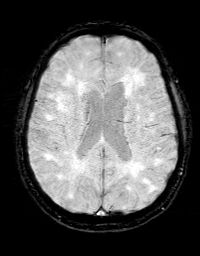
[im 57/72]
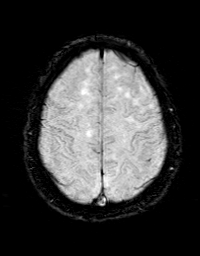
[im 72/72]
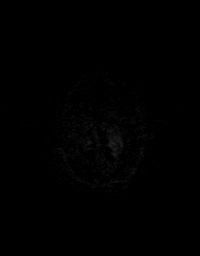

[Series 10: FLAIR · axial · 3.0mm · 0.43mm/px · z∈[-12,+144]mm · 2 of 27 slices shown]
[im 1/27]
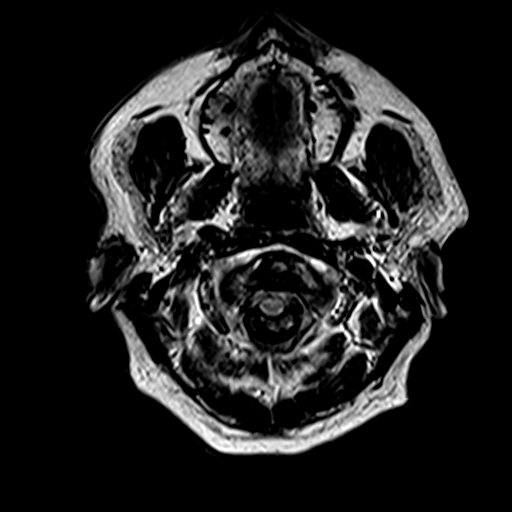
[im 27/27]
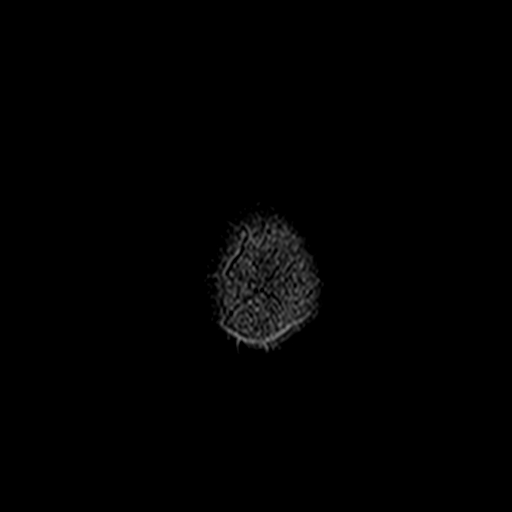

[Series 11: t2_tse_tra_512 · axial · 5.0mm · 0.60mm/px · z∈[-14,+148]mm · 2 of 28 slices shown]
[im 1/28]
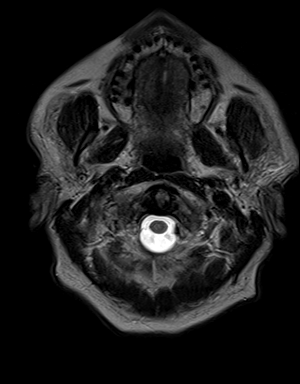
[im 28/28]
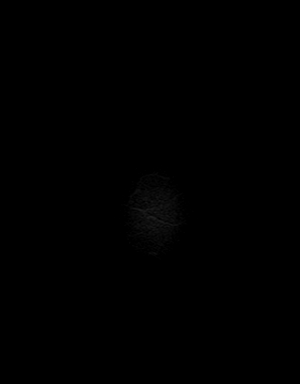

[Series 12: t1_mpr_tra · axial · 1.0mm · 0.72mm/px · z∈[-13,+146]mm · 13 of 160 slices shown]
[im 1/160]
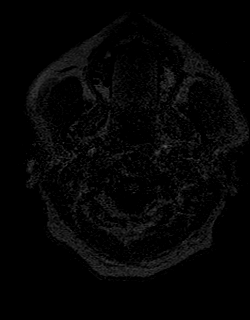
[im 14/160]
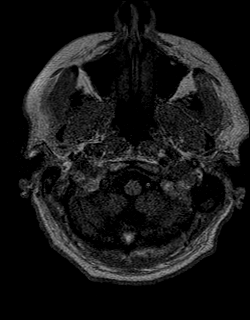
[im 27/160]
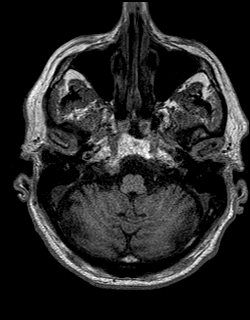
[im 40/160]
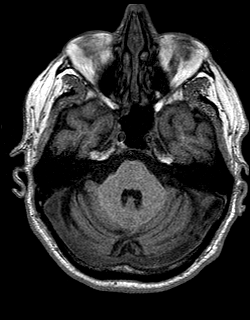
[im 54/160]
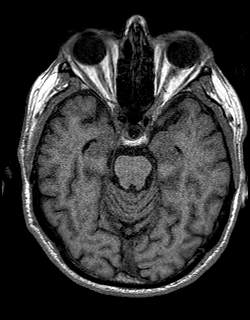
[im 67/160]
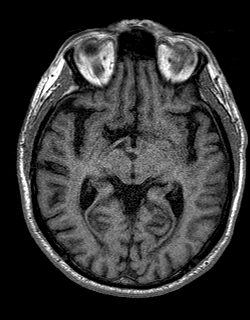
[im 80/160]
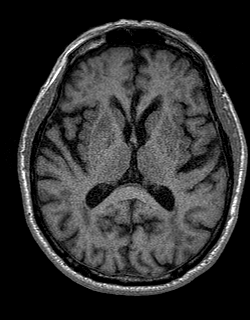
[im 93/160]
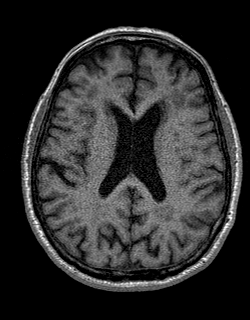
[im 107/160]
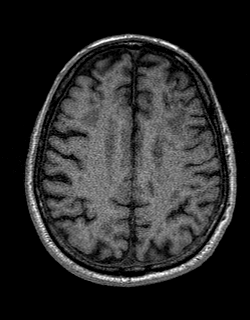
[im 120/160]
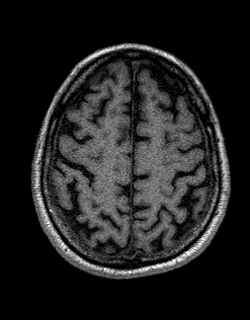
[im 133/160]
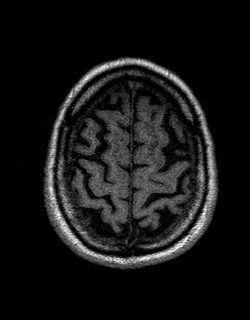
[im 146/160]
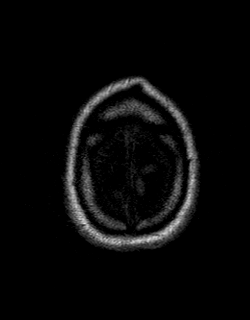
[im 160/160]
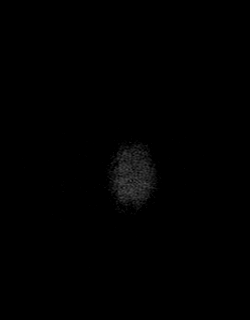

[Series 13: T2 · coronal · 5.0mm · 0.45mm/px · 2 of 30 slices shown]
[im 1/30]
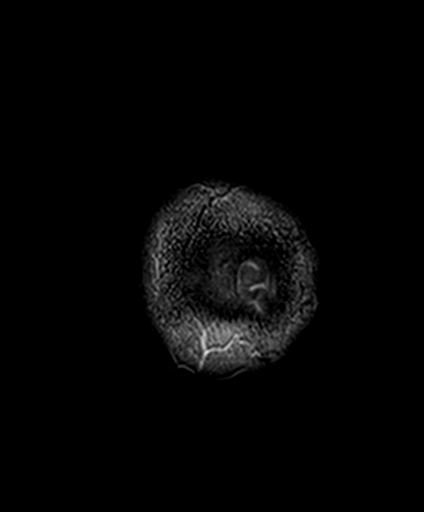
[im 30/30]
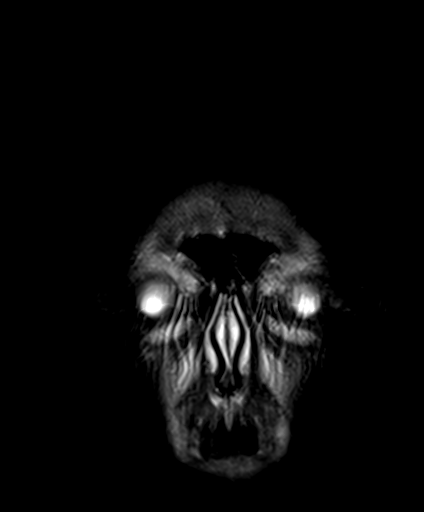

[48 of 48 positions shown; findings below may reference images not displayed]

FINDINGS: Brain:

Mildly intermittent motion degradation.

Mild generalized cerebral atrophy.

Moderately severe multifocal T2/FLAIR hyperintensity within the
cerebral white matter is nonspecific, but consistent with chronic
small vessel ischemic disease.

Tiny chronic lacunar infarct within the paramedian right pons.

There is no acute infarct.

No evidence of intracranial mass.

No chronic intracranial blood products.

No extra-axial fluid collection.

No midline shift.

Vascular: Expected proximal arterial flow voids.

Skull and upper cervical spine: No focal marrow lesion.

Sinuses/Orbits: Visualized orbits show no acute finding. Mild
ethmoid sinus mucosal thickening. Small right sphenoid sinus mucous
retention cyst. No significant mastoid effusion.
IMPRESSION: No evidence of acute intracranial abnormality.

Moderately severe cerebral white matter chronic small vessel
ischemic disease.

Tiny chronic lacunar infarct within the paramedian right pons.

Mild generalized cerebral atrophy.

## 2020-05-07 DIAGNOSIS — R972 Elevated prostate specific antigen [PSA]: Secondary | ICD-10-CM | POA: Diagnosis not present

## 2020-05-22 DIAGNOSIS — R7301 Impaired fasting glucose: Secondary | ICD-10-CM | POA: Diagnosis not present

## 2020-05-22 DIAGNOSIS — I6381 Other cerebral infarction due to occlusion or stenosis of small artery: Secondary | ICD-10-CM | POA: Diagnosis not present

## 2020-05-22 DIAGNOSIS — R69 Illness, unspecified: Secondary | ICD-10-CM | POA: Diagnosis not present

## 2020-05-22 DIAGNOSIS — R413 Other amnesia: Secondary | ICD-10-CM | POA: Diagnosis not present

## 2020-05-22 DIAGNOSIS — I2510 Atherosclerotic heart disease of native coronary artery without angina pectoris: Secondary | ICD-10-CM | POA: Diagnosis not present

## 2020-05-22 DIAGNOSIS — I10 Essential (primary) hypertension: Secondary | ICD-10-CM | POA: Diagnosis not present

## 2020-06-01 ENCOUNTER — Encounter (HOSPITAL_BASED_OUTPATIENT_CLINIC_OR_DEPARTMENT_OTHER): Payer: Self-pay

## 2020-06-01 ENCOUNTER — Emergency Department (HOSPITAL_BASED_OUTPATIENT_CLINIC_OR_DEPARTMENT_OTHER): Payer: Medicare HMO

## 2020-06-01 ENCOUNTER — Observation Stay (HOSPITAL_BASED_OUTPATIENT_CLINIC_OR_DEPARTMENT_OTHER)
Admission: EM | Admit: 2020-06-01 | Discharge: 2020-06-02 | Disposition: A | Discharge status: Home / Self Care | Payer: Medicare HMO | Attending: Internal Medicine | Admitting: Internal Medicine

## 2020-06-01 ENCOUNTER — Other Ambulatory Visit: Payer: Self-pay

## 2020-06-01 DIAGNOSIS — I6622 Occlusion and stenosis of left posterior cerebral artery: Secondary | ICD-10-CM | POA: Diagnosis not present

## 2020-06-01 DIAGNOSIS — Z79899 Other long term (current) drug therapy: Secondary | ICD-10-CM | POA: Insufficient documentation

## 2020-06-01 DIAGNOSIS — E041 Nontoxic single thyroid nodule: Secondary | ICD-10-CM | POA: Diagnosis not present

## 2020-06-01 DIAGNOSIS — I10 Essential (primary) hypertension: Secondary | ICD-10-CM | POA: Insufficient documentation

## 2020-06-01 DIAGNOSIS — I6523 Occlusion and stenosis of bilateral carotid arteries: Secondary | ICD-10-CM | POA: Diagnosis not present

## 2020-06-01 DIAGNOSIS — R4182 Altered mental status, unspecified: Secondary | ICD-10-CM | POA: Diagnosis not present

## 2020-06-01 DIAGNOSIS — R519 Headache, unspecified: Secondary | ICD-10-CM | POA: Diagnosis not present

## 2020-06-01 DIAGNOSIS — Z20822 Contact with and (suspected) exposure to covid-19: Secondary | ICD-10-CM | POA: Diagnosis not present

## 2020-06-01 DIAGNOSIS — I639 Cerebral infarction, unspecified: Principal | ICD-10-CM | POA: Insufficient documentation

## 2020-06-01 DIAGNOSIS — I672 Cerebral atherosclerosis: Secondary | ICD-10-CM | POA: Diagnosis not present

## 2020-06-01 DIAGNOSIS — Z7982 Long term (current) use of aspirin: Secondary | ICD-10-CM | POA: Diagnosis not present

## 2020-06-01 DIAGNOSIS — R69 Illness, unspecified: Secondary | ICD-10-CM | POA: Diagnosis not present

## 2020-06-01 DIAGNOSIS — F0390 Unspecified dementia without behavioral disturbance: Secondary | ICD-10-CM | POA: Insufficient documentation

## 2020-06-01 DIAGNOSIS — R4189 Other symptoms and signs involving cognitive functions and awareness: Secondary | ICD-10-CM

## 2020-06-01 DIAGNOSIS — R410 Disorientation, unspecified: Secondary | ICD-10-CM | POA: Diagnosis not present

## 2020-06-01 DIAGNOSIS — G459 Transient cerebral ischemic attack, unspecified: Secondary | ICD-10-CM | POA: Diagnosis present

## 2020-06-01 DIAGNOSIS — F419 Anxiety disorder, unspecified: Secondary | ICD-10-CM

## 2020-06-01 LAB — RESP PANEL BY RT-PCR (FLU A&B, COVID) ARPGX2
Influenza A by PCR: NEGATIVE
Influenza B by PCR: NEGATIVE
SARS Coronavirus 2 by RT PCR: NEGATIVE

## 2020-06-01 LAB — URINALYSIS, ROUTINE W REFLEX MICROSCOPIC
Bilirubin Urine: NEGATIVE
Glucose, UA: NEGATIVE mg/dL
Ketones, ur: NEGATIVE mg/dL
Leukocytes,Ua: NEGATIVE
Nitrite: NEGATIVE
Protein, ur: NEGATIVE mg/dL
Specific Gravity, Urine: 1.02 (ref 1.005–1.030)
pH: 6.5 (ref 5.0–8.0)

## 2020-06-01 LAB — URINALYSIS, MICROSCOPIC (REFLEX): Squamous Epithelial / HPF: NONE SEEN (ref 0–5)

## 2020-06-01 LAB — COMPREHENSIVE METABOLIC PANEL
ALT: 23 U/L (ref 0–44)
AST: 30 U/L (ref 15–41)
Albumin: 4.2 g/dL (ref 3.5–5.0)
Alkaline Phosphatase: 34 U/L — ABNORMAL LOW (ref 38–126)
Anion gap: 7 (ref 5–15)
BUN: 19 mg/dL (ref 8–23)
CO2: 25 mmol/L (ref 22–32)
Calcium: 8.8 mg/dL — ABNORMAL LOW (ref 8.9–10.3)
Chloride: 101 mmol/L (ref 98–111)
Creatinine, Ser: 0.97 mg/dL (ref 0.61–1.24)
GFR, Estimated: >60 mL/min (ref >60)
Glucose, Bld: 136 mg/dL — ABNORMAL HIGH (ref 70–99)
Potassium: 3.8 mmol/L (ref 3.5–5.1)
Sodium: 133 mmol/L — ABNORMAL LOW (ref 135–145)
Total Bilirubin: 1.3 mg/dL — ABNORMAL HIGH (ref 0.3–1.2)
Total Protein: 7.3 g/dL (ref 6.5–8.1)

## 2020-06-01 LAB — RAPID URINE DRUG SCREEN, HOSP PERFORMED
Amphetamines: NOT DETECTED
Barbiturates: NOT DETECTED
Benzodiazepines: NOT DETECTED
Cocaine: NOT DETECTED
Opiates: NOT DETECTED
Tetrahydrocannabinol: NOT DETECTED

## 2020-06-01 LAB — CBG MONITORING, ED: Glucose-Capillary: 122 mg/dL — ABNORMAL HIGH (ref 70–99)

## 2020-06-01 LAB — CBC
HCT: 37.9 % — ABNORMAL LOW (ref 39.0–52.0)
Hemoglobin: 13.4 g/dL (ref 13.0–17.0)
MCH: 35 pg — ABNORMAL HIGH (ref 26.0–34.0)
MCHC: 35.4 g/dL (ref 30.0–36.0)
MCV: 99 fL (ref 80.0–100.0)
Platelets: 211 10*3/uL (ref 150–400)
RBC: 3.83 MIL/uL — ABNORMAL LOW (ref 4.22–5.81)
RDW: 12 % (ref 11.5–15.5)
WBC: 6.8 10*3/uL (ref 4.0–10.5)
nRBC: 0 % (ref 0.0–0.2)

## 2020-06-01 LAB — PROTIME-INR
INR: 1.1 (ref 0.8–1.2)
Prothrombin Time: 13.5 seconds (ref 11.4–15.2)

## 2020-06-01 LAB — DIFFERENTIAL
Abs Immature Granulocytes: 0.03 10*3/uL (ref 0.00–0.07)
Basophils Absolute: 0 10*3/uL (ref 0.0–0.1)
Basophils Relative: 0 %
Eosinophils Absolute: 0 10*3/uL (ref 0.0–0.5)
Eosinophils Relative: 0 %
Immature Granulocytes: 0 %
Lymphocytes Relative: 19 %
Lymphs Abs: 1.3 10*3/uL (ref 0.7–4.0)
Monocytes Absolute: 0.6 10*3/uL (ref 0.1–1.0)
Monocytes Relative: 8 %
Neutro Abs: 4.9 10*3/uL (ref 1.7–7.7)
Neutrophils Relative %: 73 %

## 2020-06-01 LAB — APTT: aPTT: 29 seconds (ref 24–36)

## 2020-06-01 LAB — SEDIMENTATION RATE: Sed Rate: 8 mm/hr (ref 0–16)

## 2020-06-01 IMAGING — CT CT HEAD W/O CM
3 series · 15 of 47 positions shown, 18 images · non-contrast
Comparison: [DATE].

CLINICAL DATA: Headache, confusion.

EXAM:
CT HEAD WITHOUT CONTRAST
TECHNIQUE: Contiguous axial images were obtained from the base of the skull
through the vertex without intravenous contrast.

[Series 2: head wo · axial · 0.46mm/px · z∈[+604,+738]mm · 9 of 33 slices shown, 12 images]
[im 3/33  brain]
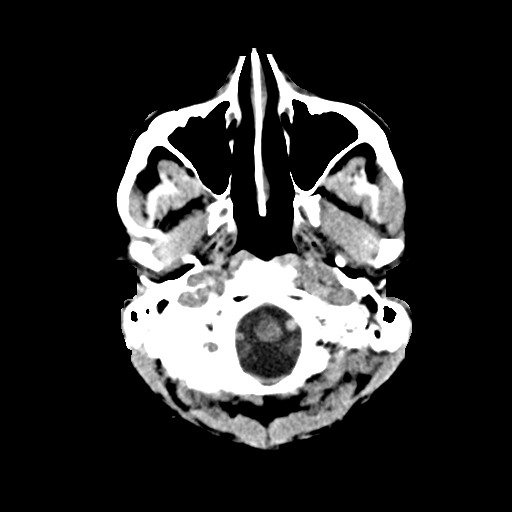
[im 3/33  bone]
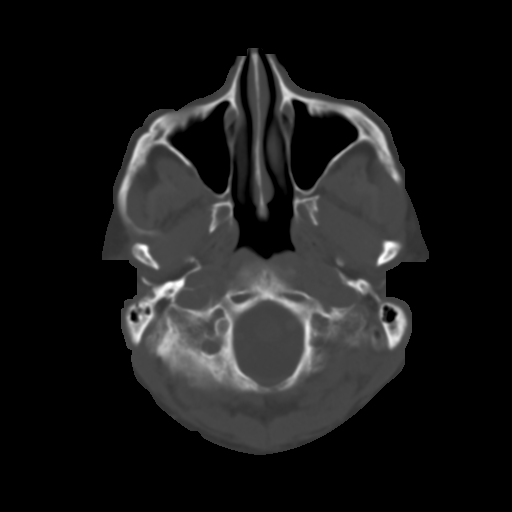
[im 6/33  brain]
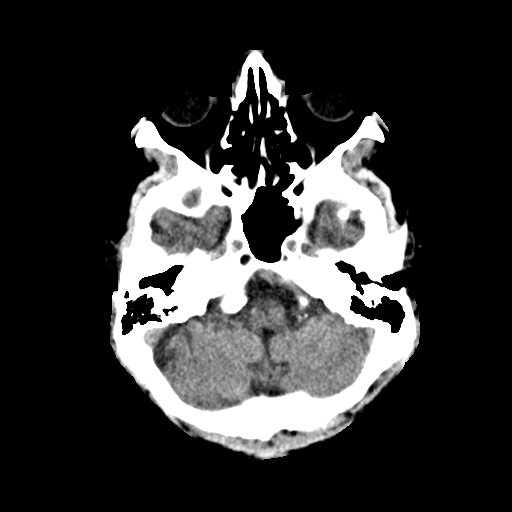
[im 9/33  brain]
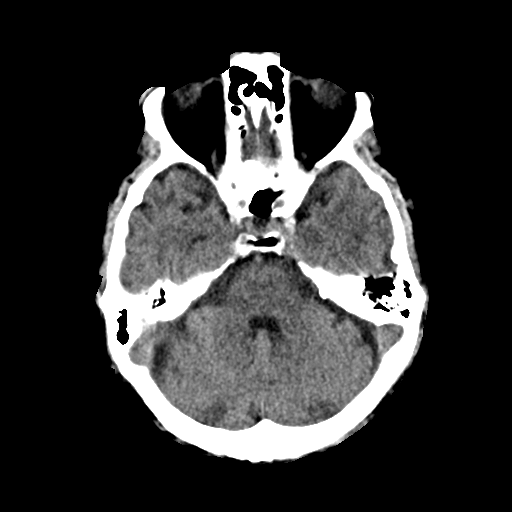
[im 13/33  brain]
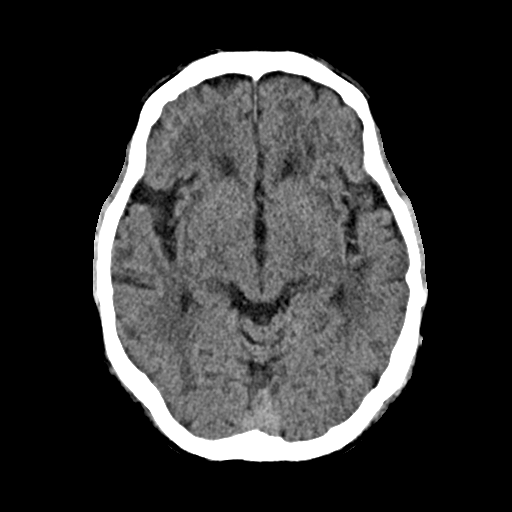
[im 17/33  brain]
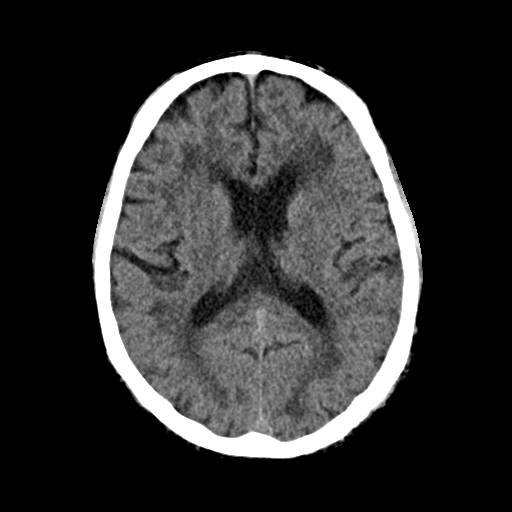
[im 17/33  bone]
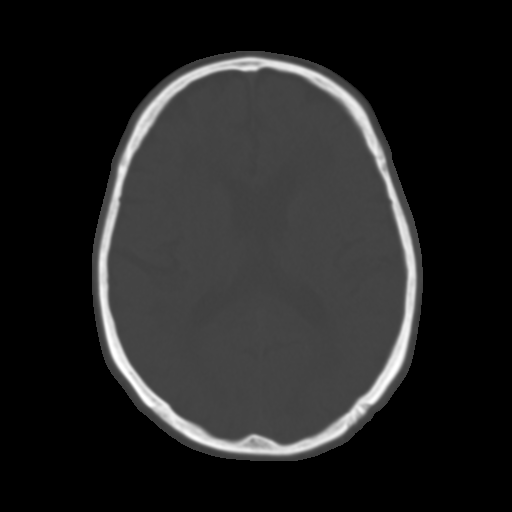
[im 20/33  brain]
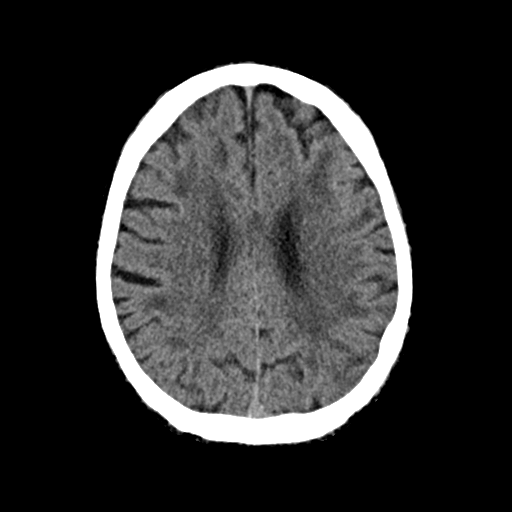
[im 24/33  brain]
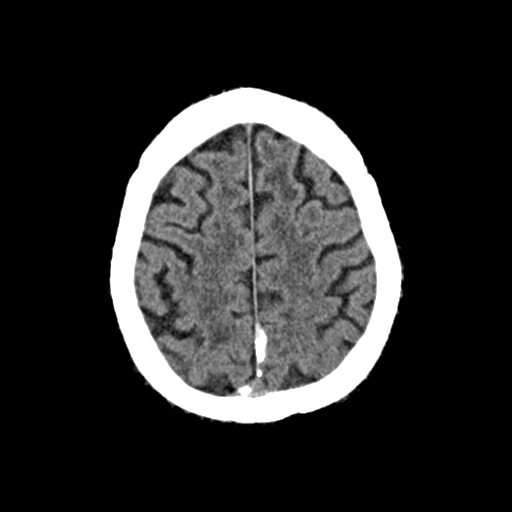
[im 27/33  brain]
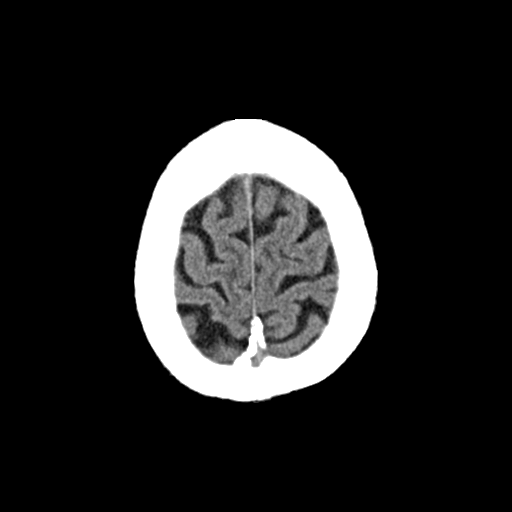
[im 30/33  brain]
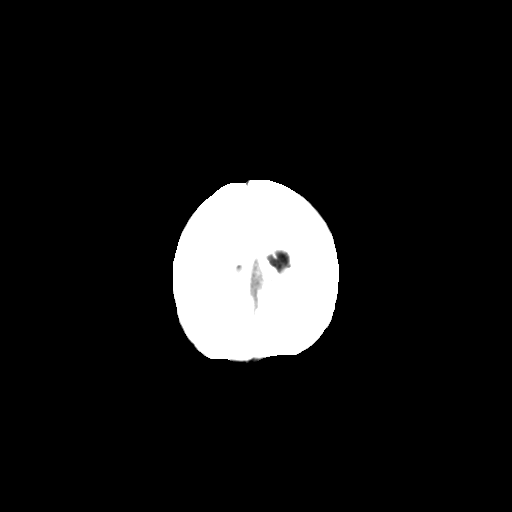
[im 30/33  bone]
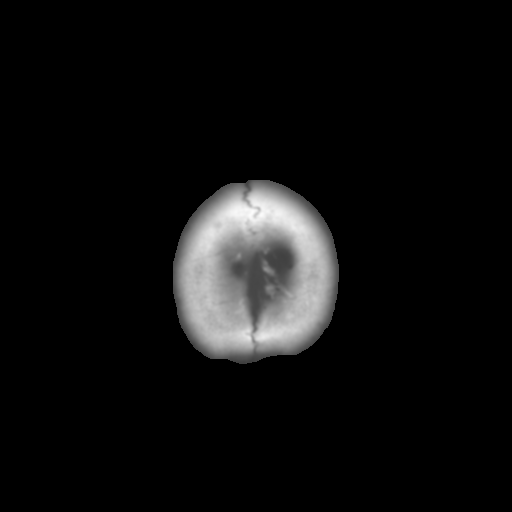

[Series 4: coronal soft · coronal · 0.33mm/px · 3 of 70 slices shown]
[im 24/70  brain]
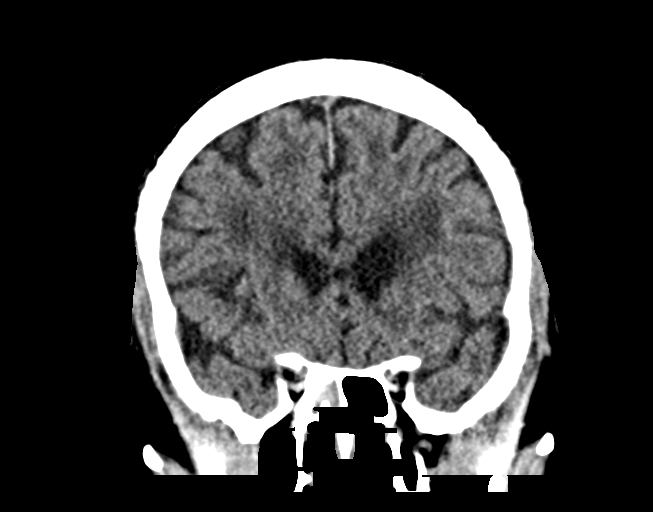
[im 31/70  brain]
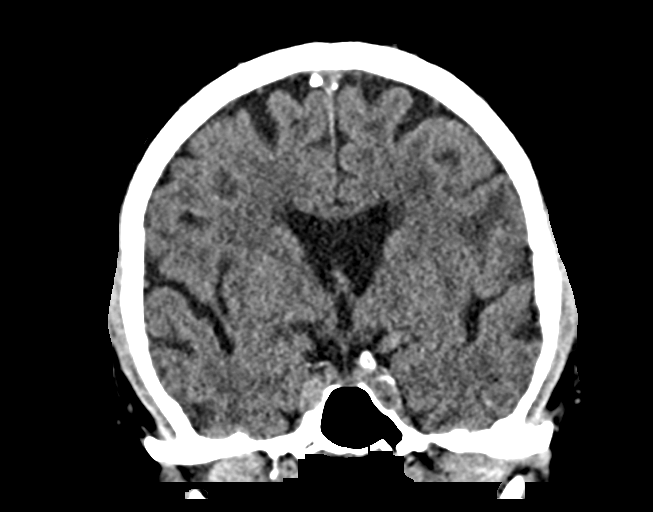
[im 39/70  brain]
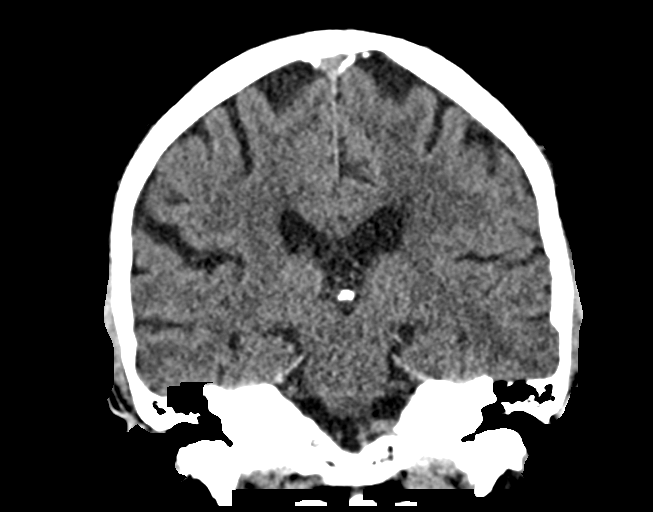

[Series 5: sag soft · sagittal · 0.33mm/px · 3 of 59 slices shown]
[im 20/59  brain]
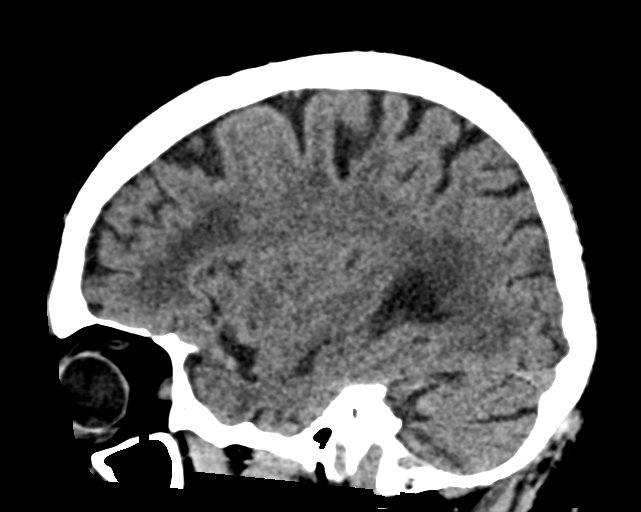
[im 30/59  brain]
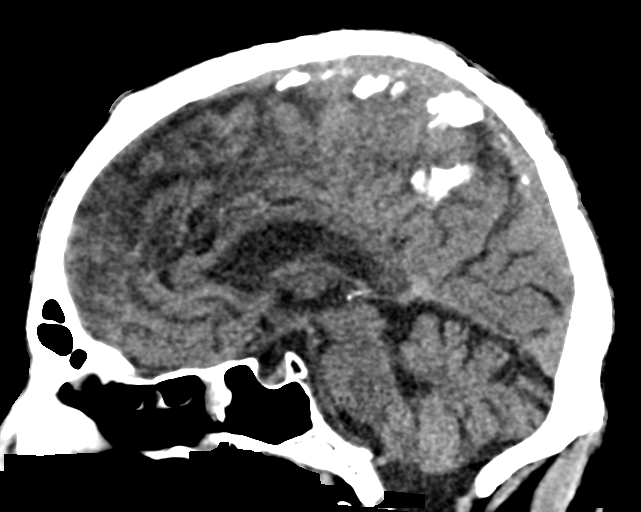
[im 39/59  brain]
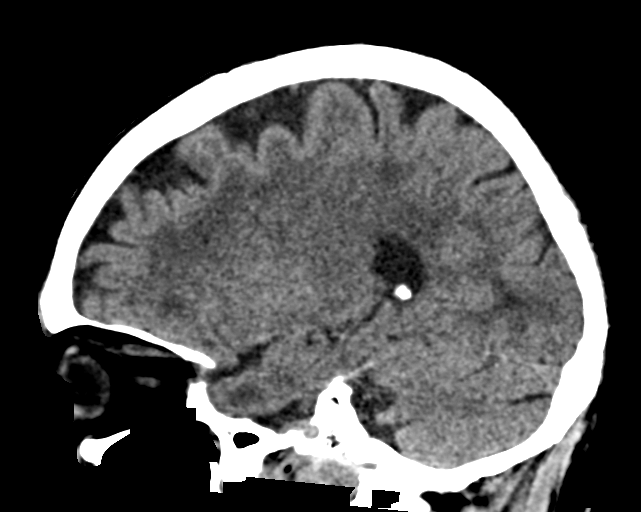

[15 of 47 positions shown; findings below may reference images not displayed]

FINDINGS: Brain: Mild chronic ischemic white matter disease is noted. No mass
effect or midline shift is noted. Ventricular size is within normal
limits. There is no evidence of mass lesion, hemorrhage or acute
infarction.

Vascular: No hyperdense vessel or unexpected calcification.

Skull: Normal. Negative for fracture or focal lesion.

Sinuses/Orbits: No acute finding.

Other: None.
IMPRESSION: Mild chronic ischemic white matter disease. No acute intracranial
abnormality seen.

## 2020-06-01 IMAGING — CT CT ANGIO NECK
1 of 8 series · 6 of 33 positions shown · IV contrast (Omnipaque)
Comparison: [DATE] head CT.  [DATE] MRI head.

CLINICAL DATA: Neuro deficit, acute, stroke suspected

EXAM:
CT ANGIOGRAPHY HEAD AND NECK
TECHNIQUE: Multidetector CT imaging of the head and neck was performed using
the standard protocol during bolus administration of intravenous
contrast. Multiplanar CT image reconstructions and MIPs were
obtained to evaluate the vascular anatomy. Carotid stenosis
measurements (when applicable) are obtained utilizing NASCET
criteria, using the distal internal carotid diameter as the
denominator.
CONTRAST:  100mL OMNIPAQUE IOHEXOL 350 MG/ML SOLN

[Series 8: axial thin · axial · 0.46mm/px · z∈[+621,+886]mm · 6 of 373 slices shown]
[im 54/373  soft-tissue]
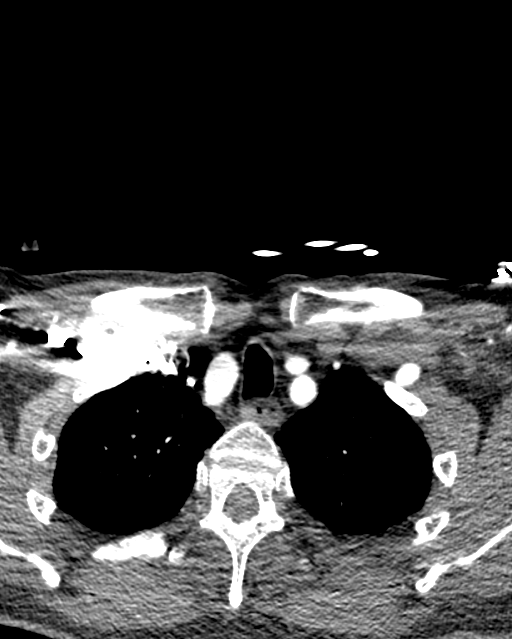
[im 107/373  bone]
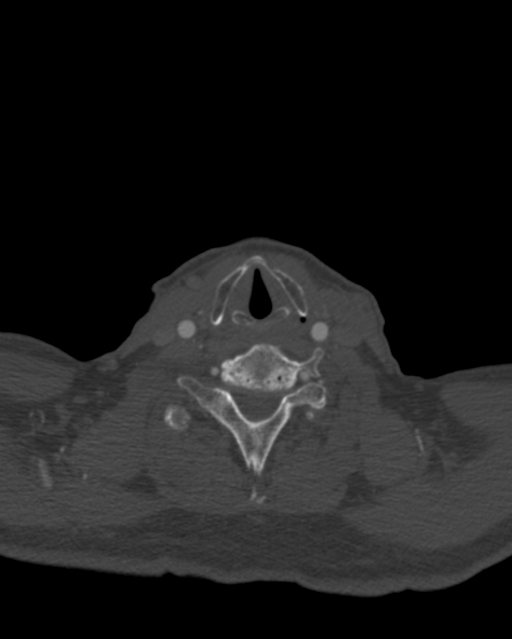
[im 160/373  soft-tissue]
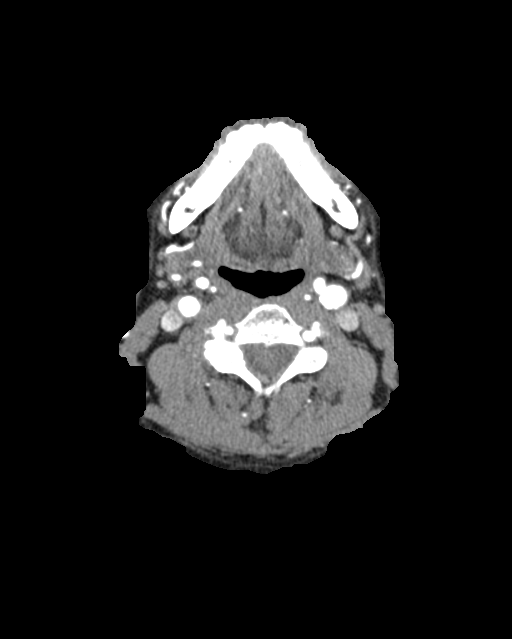
[im 213/373  bone]
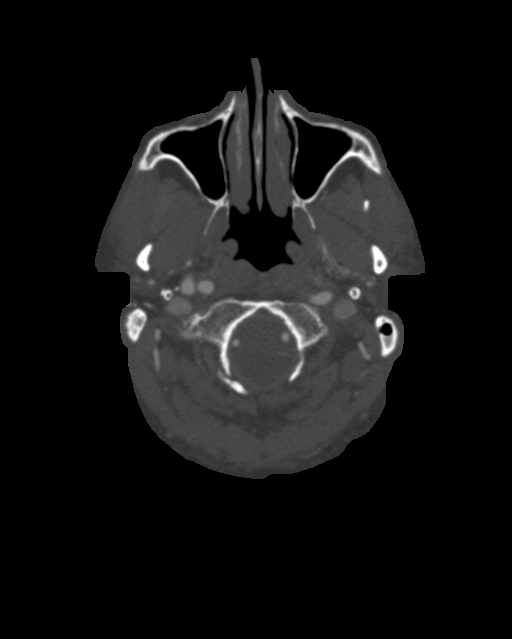
[im 266/373  soft-tissue]
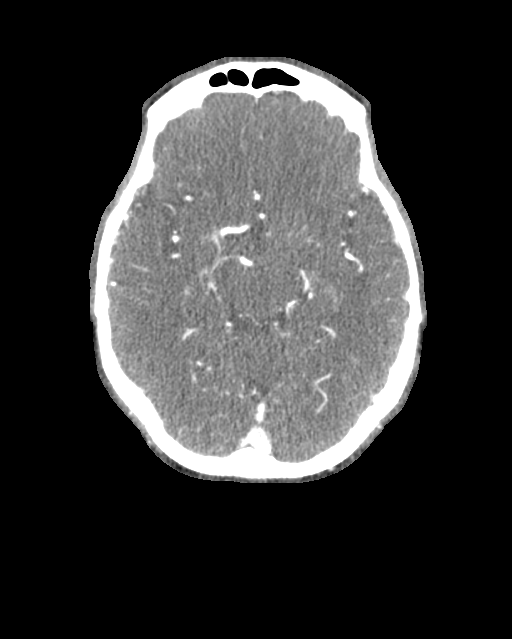
[im 319/373  bone]
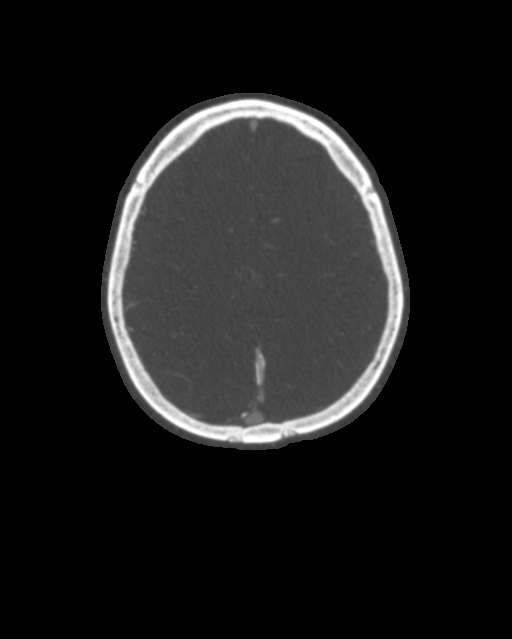

[6 of 33 positions shown; findings below may reference images not displayed]

FINDINGS: CTA NECK FINDINGS

Aortic arch: Standard branching. Imaged portion shows no evidence of
aneurysm or dissection. No significant stenosis of the major arch
vessel origins. Minimal aortic arch atherosclerotic calcifications.

Right carotid system: No evidence of dissection, stenosis (50% or
greater) or occlusion. Proximal to mid ICA calcified atheromatous
plaque with 30% luminal narrowing.

Left carotid system: No evidence of dissection, stenosis (50% or
greater) or occlusion. Carotid bulb atherosclerotic calcifications
with 20% luminal narrowing.

Vertebral arteries: Dominant left vertebral artery. No evidence of
dissection, stenosis (50% or greater) or occlusion.

Skeleton: No acute finding.  Multilevel spondylosis.

Other neck: No adenopathy. 1.3 cm enhancing nodule dorsal to the
left thyroid ([DATE]) with an associated polar vessel may reflect a
parathyroid adenoma versus exophytic thyroid nodule.

Upper chest: Dependent atelectasis.

Review of the MIP images confirms the above findings

CTA HEAD FINDINGS

Anterior circulation: Patent ICAs. Bilateral carotid siphon
atherosclerotic calcifications. Patent ACAs and MCAs. No significant
stenosis, proximal occlusion, aneurysm, or vascular malformation.

Posterior circulation: Patent V4 segments and PICA. Patent basilar
artery. Patent superior cerebellar and posterior cerebral arteries.
Fetal origin of the left PCA with mild narrowing of the P2 segment.
No proximal occlusion, aneurysm, or vascular malformation.

Venous sinuses: As permitted by contrast timing, patent.

Anatomic variants: Please see above.

Review of the MIP images confirms the above findings
IMPRESSION: 1. No large vessel occlusion, hemodynamically significant stenosis,
or evidence of dissection.
2. 20-30% luminal narrowing of the proximal right ICA and left
carotid bulb
3. Mild narrowing of the left P2 segment.
4. 1.3 cm enhancing nodule dorsal to the left thyroid lobe may
reflect a parathyroid adenoma versus exophytic thyroid nodule.
Consider outpatient thyroid ultrasound and correlation with
endocrine labs for further evaluation.

## 2020-06-01 IMAGING — DX DG CHEST 1V PORT
1 series · 1 of 1 positions shown · non-contrast
Comparison: [DATE].

CLINICAL DATA: Altered mental status.

EXAM:
PORTABLE CHEST 1 VIEW

[chest ap]
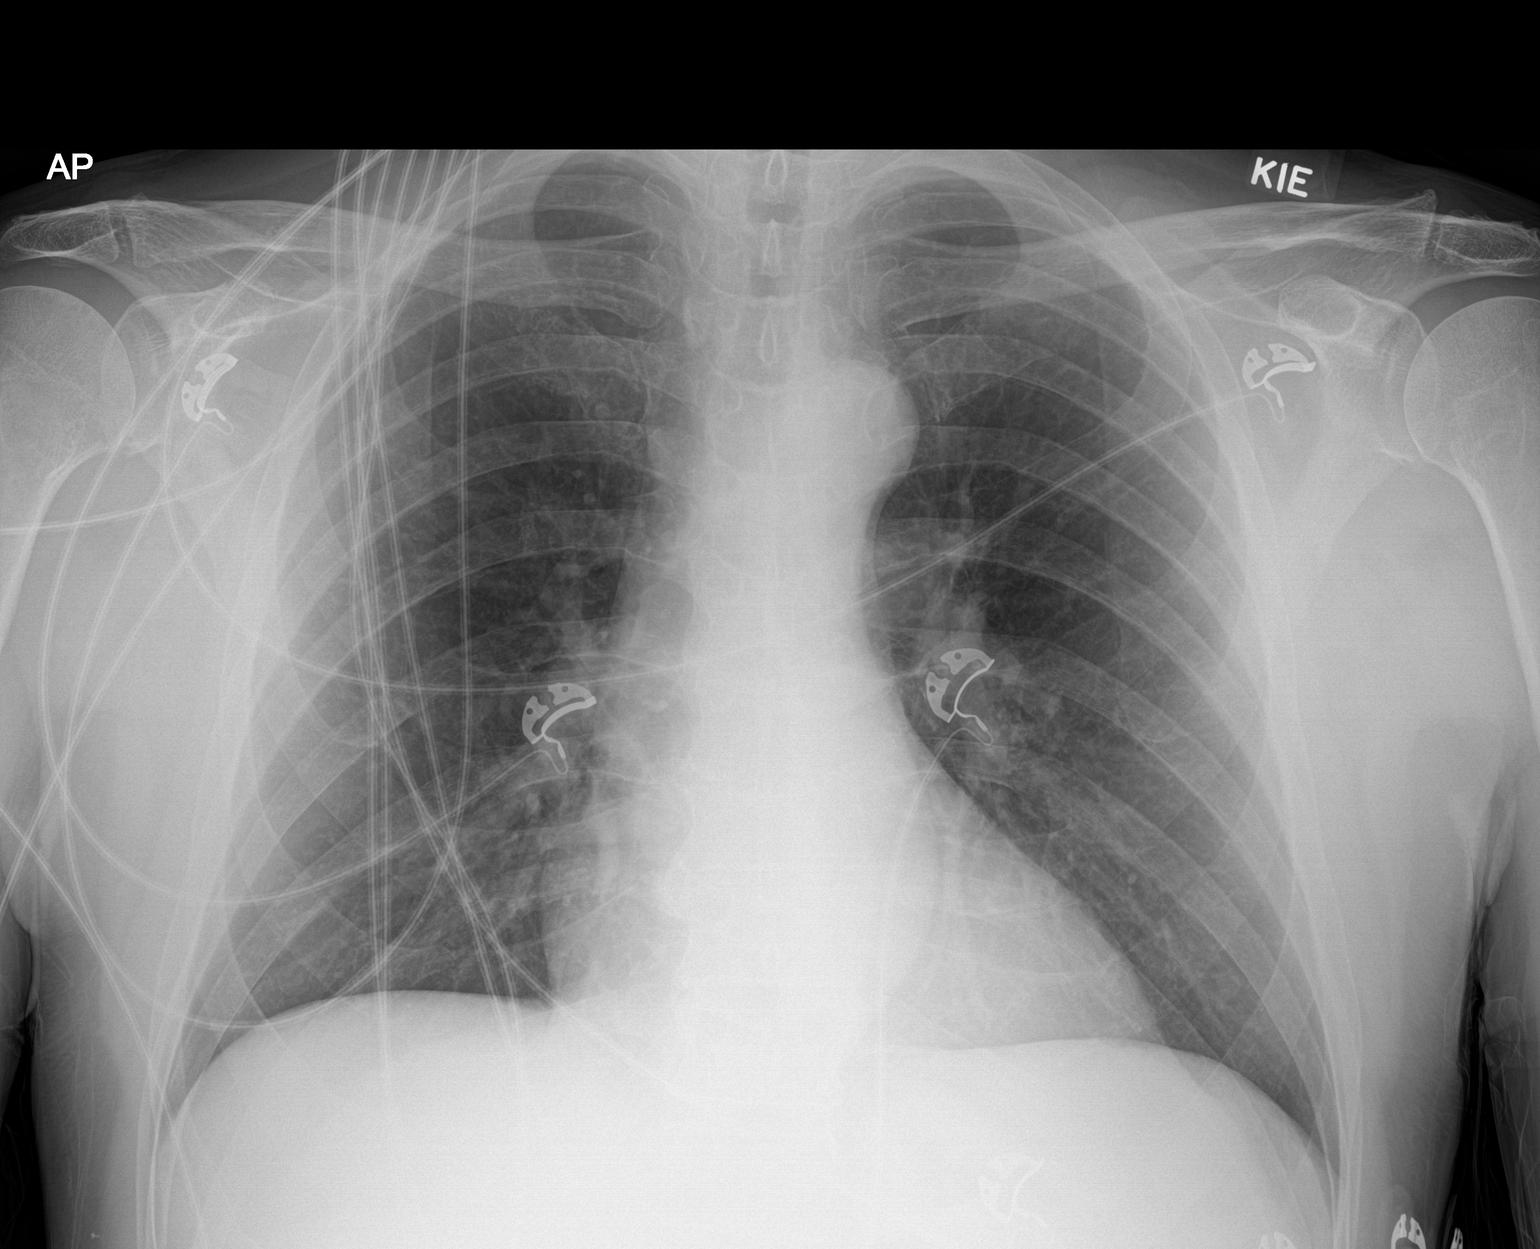

[1 of 1 positions shown; findings below may reference images not displayed]

FINDINGS: The heart size and mediastinal contours are within normal limits.
Both lungs are clear. The visualized skeletal structures are
unremarkable.
IMPRESSION: No active disease.

## 2020-06-01 IMAGING — CT CT ANGIO HEAD
1 of 11 series · 3 of 33 positions shown · IV contrast (Omnipaque)
Comparison: [DATE] head CT.  [DATE] MRI head.

CLINICAL DATA: Neuro deficit, acute, stroke suspected

EXAM:
CT ANGIOGRAPHY HEAD AND NECK
TECHNIQUE: Multidetector CT imaging of the head and neck was performed using
the standard protocol during bolus administration of intravenous
contrast. Multiplanar CT image reconstructions and MIPs were
obtained to evaluate the vascular anatomy. Carotid stenosis
measurements (when applicable) are obtained utilizing NASCET
criteria, using the distal internal carotid diameter as the
denominator.
CONTRAST:  100mL OMNIPAQUE IOHEXOL 350 MG/ML SOLN

[Series 514: axial thin · axial · 0.46mm/px · z∈[+568,+940]mm · 3 of 373 slices shown]
[im 1/373  soft-tissue]
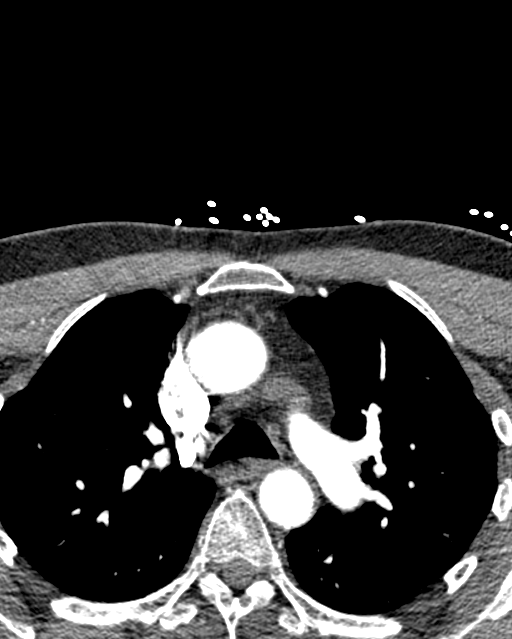
[im 187/373  bone]
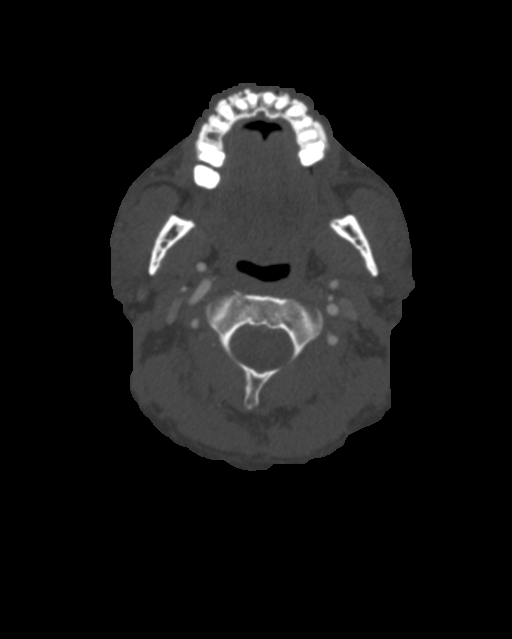
[im 373/373  soft-tissue]
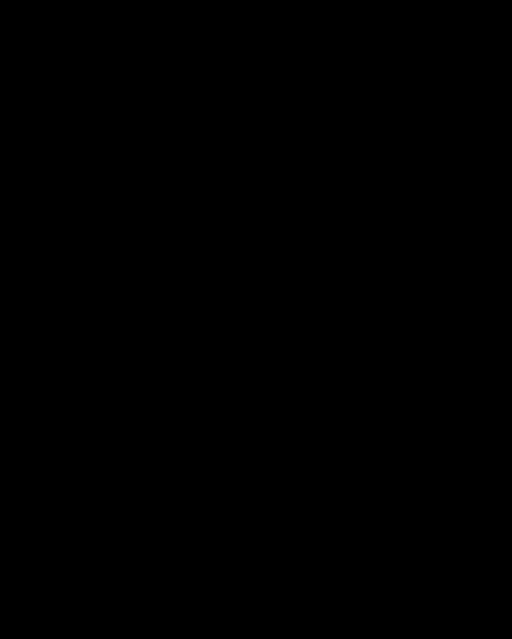

[3 of 33 positions shown; findings below may reference images not displayed]

FINDINGS: CTA NECK FINDINGS

Aortic arch: Standard branching. Imaged portion shows no evidence of
aneurysm or dissection. No significant stenosis of the major arch
vessel origins. Minimal aortic arch atherosclerotic calcifications.

Right carotid system: No evidence of dissection, stenosis (50% or
greater) or occlusion. Proximal to mid ICA calcified atheromatous
plaque with 30% luminal narrowing.

Left carotid system: No evidence of dissection, stenosis (50% or
greater) or occlusion. Carotid bulb atherosclerotic calcifications
with 20% luminal narrowing.

Vertebral arteries: Dominant left vertebral artery. No evidence of
dissection, stenosis (50% or greater) or occlusion.

Skeleton: No acute finding.  Multilevel spondylosis.

Other neck: No adenopathy. 1.3 cm enhancing nodule dorsal to the
left thyroid ([DATE]) with an associated polar vessel may reflect a
parathyroid adenoma versus exophytic thyroid nodule.

Upper chest: Dependent atelectasis.

Review of the MIP images confirms the above findings

CTA HEAD FINDINGS

Anterior circulation: Patent ICAs. Bilateral carotid siphon
atherosclerotic calcifications. Patent ACAs and MCAs. No significant
stenosis, proximal occlusion, aneurysm, or vascular malformation.

Posterior circulation: Patent V4 segments and PICA. Patent basilar
artery. Patent superior cerebellar and posterior cerebral arteries.
Fetal origin of the left PCA with mild narrowing of the P2 segment.
No proximal occlusion, aneurysm, or vascular malformation.

Venous sinuses: As permitted by contrast timing, patent.

Anatomic variants: Please see above.

Review of the MIP images confirms the above findings
IMPRESSION: 1. No large vessel occlusion, hemodynamically significant stenosis,
or evidence of dissection.
2. 20-30% luminal narrowing of the proximal right ICA and left
carotid bulb
3. Mild narrowing of the left P2 segment.
4. 1.3 cm enhancing nodule dorsal to the left thyroid lobe may
reflect a parathyroid adenoma versus exophytic thyroid nodule.
Consider outpatient thyroid ultrasound and correlation with
endocrine labs for further evaluation.

## 2020-06-01 MED ORDER — SODIUM CHLORIDE 0.9 % IV SOLN: INTRAVENOUS | Status: AC

## 2020-06-01 MED ORDER — SENNOSIDES-DOCUSATE SODIUM 8.6-50 MG PO TABS: 1.0000 | ORAL_TABLET | Freq: Every evening | ORAL | Status: DC | PRN

## 2020-06-01 MED ORDER — VITAMIN D-3 125 MCG (5000 UT) PO TABS: 1.0000 | ORAL_TABLET | ORAL | Status: DC

## 2020-06-01 MED ORDER — ACETAMINOPHEN 325 MG PO TABS: 650.0000 mg | ORAL_TABLET | ORAL | Status: DC | PRN

## 2020-06-01 MED ORDER — ACETAMINOPHEN 650 MG RE SUPP: 650.0000 mg | RECTAL | Status: DC | PRN

## 2020-06-01 MED ORDER — ROSUVASTATIN CALCIUM 20 MG PO TABS
20.0000 mg | ORAL_TABLET | Freq: Every day | ORAL | Status: DC
  Administered 2020-06-01: 20 mg via ORAL
  Filled 2020-06-01: qty 1

## 2020-06-01 MED ORDER — IOHEXOL 350 MG/ML SOLN
100.0000 mL | Freq: Once | INTRAVENOUS | Status: AC | PRN
  Administered 2020-06-01: 100 mL via INTRAVENOUS

## 2020-06-01 MED ORDER — GALANTAMINE HYDROBROMIDE ER 8 MG PO CP24
16.0000 mg | ORAL_CAPSULE | Freq: Every day | ORAL | Status: DC
  Administered 2020-06-02: 16 mg via ORAL
  Filled 2020-06-01 (×2): qty 2

## 2020-06-01 MED ORDER — VITAMIN D 25 MCG (1000 UNIT) PO TABS: 1000.0000 [IU] | ORAL_TABLET | ORAL | Status: DC

## 2020-06-01 MED ORDER — ESCITALOPRAM OXALATE 10 MG PO TABS
20.0000 mg | ORAL_TABLET | Freq: Every day | ORAL | Status: DC
  Administered 2020-06-02: 20 mg via ORAL
  Filled 2020-06-01: qty 2

## 2020-06-01 MED ORDER — ACETAMINOPHEN 160 MG/5ML PO SOLN: 650.0000 mg | ORAL | Status: DC | PRN

## 2020-06-01 MED ORDER — ASPIRIN EC 81 MG PO TBEC
81.0000 mg | DELAYED_RELEASE_TABLET | Freq: Every day | ORAL | Status: DC
  Administered 2020-06-02: 81 mg via ORAL
  Filled 2020-06-01: qty 1

## 2020-06-01 MED ORDER — STROKE: EARLY STAGES OF RECOVERY BOOK
Freq: Once | Status: AC
  Filled 2020-06-01: qty 1

## 2020-06-01 MED ORDER — ENOXAPARIN SODIUM 30 MG/0.3ML ~~LOC~~ SOLN
30.0000 mg | Freq: Two times a day (BID) | SUBCUTANEOUS | Status: DC
  Administered 2020-06-01 – 2020-06-02 (×2): 30 mg via SUBCUTANEOUS
  Filled 2020-06-01 (×2): qty 0.3

## 2020-06-01 MED ORDER — LORAZEPAM 0.5 MG PO TABS: 0.5000 mg | ORAL_TABLET | Freq: Four times a day (QID) | ORAL | Status: DC | PRN

## 2020-06-01 NOTE — ED Notes (Signed)
ED Provider at bedside. 

## 2020-06-01 NOTE — H&P (Signed)
History and Physical   Patrick Macias LEX:[id number] DOB: 12/04/1940 DOA: 06/01/2020  PCP: Crist Infante, MD   Patient coming from: Home via [place]  Chief Complaint: Confusion and speech changes  HPI: Patrick Macias is a 79 y.o. male with medical history significant of dementia, hypertension, hyperlipidemia, anxiety who presents after his family noticed he had increased confusion and speech difficulties this morning.  Patient was last seen normal at 9 PM the night prior.  As above the confusion and speech changes were noted this morning and were different from his baseline dementia per family.  Family reports they noticed he was unsteady on his feet initially, he then later put on 2 different shoes, he was very fidgety compared to his baseline and was also having difficulty finding the right words. Patient can recall some double vision but his memory of the events is otherwise nonspecific.  He denies fever, cough, chest pain, shortness of breath, abdominal pain, nausea, constipation, diarrhea.  He does report a headache yesterday.  ED Course: Vital signs significant for blood pressure in the 449 systolic and pulses in the 50s.  BMP showed sodium 133 and glucose 136.  LFTs showed calcium 8.8 and T bili 1.3.  CBC within normal limits.  PT PTT and INR within normal limits.  Respiratory panel for flu and Covid negative.  UA without signs of infection, UDS negative, ESR within normal limits.  Chest x-ray within normal limits.  CT head showed no acute abnormality.  CTA head and neck showed narrowing of proximal right ICA, left carotid bulb, P2 segment; also noted incidental thyroid changes which may need follow-up.  On call neuro hospitalist consulted by EDP who recommended MRI and admission.  Review of Systems: As per HPI otherwise all other systems reviewed and are negative.  Past Medical History:  Diagnosis Date  . Anxiety   . BPH (benign prostatic hyperplasia)   . Cerumen impaction    . Dyslipidemia   . H/O lithotripsy   . Hearing decreased, bilateral   . Hx of colonic polyps   . Hypertension   . IFG (impaired fasting glucose)   . Macrocytosis   . Nephrolithiasis   . Vitamin D deficiency     Past Surgical History:  Procedure Laterality Date  . CATARACT EXTRACTION, BILATERAL    . PROSTATECTOMY      Social History  reports that he has never smoked. He has never used smokeless tobacco. He reports previous alcohol use. He reports that he does not use drugs.  No Known Allergies  Family History  Problem Relation Age of Onset  . Sudden death Neg Hx   . Hypertension Neg Hx   . Hyperlipidemia Neg Hx   . Diabetes Neg Hx   . Heart attack Neg Hx   Reviewed on admission  Prior to Admission medications   Medication Sig Start Date End Date Taking? Authorizing Provider  aspirin EC 81 MG tablet Take 1 tablet (81 mg total) by mouth daily. 05/11/19  Yes Belva Crome, MD  Cholecalciferol (VITAMIN D-3) 125 MCG (5000 UT) TABS Take 1 tablet by mouth 3 (three) times a week.    Yes [provider]  escitalopram (LEXAPRO) 20 MG tablet Take 20 mg by mouth daily.   Yes [provider]  galantamine (RAZADYNE ER) 16 MG 24 hr capsule Take 16 mg by mouth daily with breakfast.   Yes [provider]  lisinopril-hydrochlorothiazide (ZESTORETIC) 20-25 MG tablet Take 1 tablet by mouth  daily. 04/08/19  Yes [provider]  rosuvastatin (CRESTOR) 20 MG tablet Take 20 mg by mouth at bedtime. 05/03/19  Yes [provider]    Physical Exam: Vitals:   06/01/20 1934 06/01/20 2000 06/01/20 2020 06/01/20 2124  BP: (!) 133/59 (!) 153/99  (!) 155/65  Pulse: (!) 52 (!) 51  (!) 54  Resp: _0 Temp: 98.4 F (36.9 C)   98.7 F (37.1 C)  TempSrc: Oral   Oral  SpO2: 100% 100% 96% 99%  Weight:      Height:       Physical Exam Constitutional:      General: He is not in acute distress.    Appearance: Normal appearance.  HENT:     Head:  Normocephalic and atraumatic.     Mouth/Throat:     Mouth: Mucous membranes are moist.     Pharynx: Oropharynx is clear.  Eyes:     Extraocular Movements: Extraocular movements intact.     Pupils: Pupils are equal, round, and reactive to light.  Cardiovascular:     Rate and Rhythm: Normal rate and regular rhythm.     Pulses: Normal pulses.     Heart sounds: Normal heart sounds.  Pulmonary:     Effort: Pulmonary effort is normal. No respiratory distress.     Breath sounds: Normal breath sounds.  Abdominal:     General: Bowel sounds are normal. There is no distension.     Palpations: Abdomen is soft.     Tenderness: There is no abdominal tenderness.  Musculoskeletal:        General: No swelling or deformity.  Skin:    General: Skin is warm and dry.  Neurological:     Comments: Mental Status: Patient is awake, alert, oriented Cranial Nerves: II: Pupils equal, round, and reactive to light.  III,IV, VI: EOMI without ptosis or diploplia.  V: Facial sensation is symmetric tolight touch VII: Facial movement is symmetric.  VIII: hearing is intact to voice X: Uvula elevates symmetrically XI: Shoulder shrug is symmetric. XII: tongue is midline without atrophy or fasciculations.  Motor: Good effort thorughout, at Least 5/5 bilateral UE, 5/5 bilateral lower extremitiy Sensory: Sensation is grossly intact bilateral UEs & LEs  Cerebellar: Finger-Nose intact bilalat, but slower with his right hand and with some difficulty understanding and following the commands with his right hand initially.     Labs on Admission: I have personally reviewed following labs and imaging studies  CBC: Recent Labs  Lab 06/01/20 0943  WBC 6.8  NEUTROABS 4.9  HGB 13.4  HCT 37.9*  MCV 99.0  PLT 008    Basic Metabolic Panel: Recent Labs  Lab 06/01/20 0943  NA 133*  K 3.8  CL 101  CO2 25  GLUCOSE 136*  BUN 19  CREATININE 0.97  CALCIUM 8.8*    GFR: Estimated Creatinine Clearance: 67.9  mL/min (by C-G formula based on SCr of 0.97 mg/dL).  Liver Function Tests: Recent Labs  Lab 06/01/20 0943  AST 30  ALT 23  ALKPHOS 34*  BILITOT 1.3*  PROT 7.3  ALBUMIN 4.2    Urine analysis:    Component Value Date/Time   COLORURINE YELLOW 06/01/2020 1200   APPEARANCEUR CLEAR 06/01/2020 1200   LABSPEC 1.020 06/01/2020 1200   PHURINE 6.5 06/01/2020 1200   GLUCOSEU NEGATIVE 06/01/2020 1200   HGBUR TRACE (A) 06/01/2020 1200   BILIRUBINUR NEGATIVE 06/01/2020 1200   KETONESUR NEGATIVE 06/01/2020 1200   PROTEINUR NEGATIVE 06/01/2020  [place] 06/01/2020 1200   LEUKOCYTESUR NEGATIVE 06/01/2020 1200    Radiological Exams on Admission: CT Angio Head W or Wo Contrast  Result Date: 06/01/2020 CLINICAL DATA:  Neuro deficit, acute, stroke suspected EXAM: CT ANGIOGRAPHY HEAD AND NECK TECHNIQUE: Multidetector CT imaging of the head and neck was performed using the standard protocol during bolus administration of intravenous contrast. Multiplanar CT image reconstructions and MIPs were obtained to evaluate the vascular anatomy. Carotid stenosis measurements (when applicable) are obtained utilizing NASCET criteria, using the distal internal carotid diameter as the denominator. CONTRAST:  137m OMNIPAQUE IOHEXOL 350 MG/ML SOLN COMPARISON:  06/01/2020 head CT.  03/20/2020 MRI head. FINDINGS: CTA NECK FINDINGS Aortic arch: Standard branching. Imaged portion shows no evidence of aneurysm or dissection. No significant stenosis of the major arch vessel origins. Minimal aortic arch atherosclerotic calcifications. Right carotid system: No evidence of dissection, stenosis (50% or greater) or occlusion. Proximal to mid ICA calcified atheromatous plaque with 30% luminal narrowing. Left carotid system: No evidence of dissection, stenosis (50% or greater) or occlusion. Carotid bulb atherosclerotic calcifications with 20% luminal narrowing. Vertebral arteries: Dominant left vertebral artery. No  evidence of dissection, stenosis (50% or greater) or occlusion. Skeleton: No acute finding.  Multilevel spondylosis. Other neck: No adenopathy. 1.3 cm enhancing nodule dorsal to the left thyroid (515:154) with an associated polar vessel may reflect a parathyroid adenoma versus exophytic thyroid nodule. Upper chest: Dependent atelectasis. Review of the MIP images confirms the above findings CTA HEAD FINDINGS Anterior circulation: Patent ICAs. Bilateral carotid siphon atherosclerotic calcifications. Patent ACAs and MCAs. No significant stenosis, proximal occlusion, aneurysm, or vascular malformation. Posterior circulation: Patent V4 segments and PICA. Patent basilar artery. Patent superior cerebellar and posterior cerebral arteries. Fetal origin of the left PCA with mild narrowing of the P2 segment. No proximal occlusion, aneurysm, or vascular malformation. Venous sinuses: As permitted by contrast timing, patent. Anatomic variants: Please see above. Review of the MIP images confirms the above findings IMPRESSION: 1. No large vessel occlusion, hemodynamically significant stenosis, or evidence of dissection. 2. 20-30% luminal narrowing of the proximal right ICA and left carotid bulb 3. Mild narrowing of the left P2 segment. 4. 1.3 cm enhancing nodule dorsal to the left thyroid lobe may reflect a parathyroid adenoma versus exophytic thyroid nodule. Consider outpatient thyroid ultrasound and correlation with endocrine labs for further evaluation. Electronically Signed   By: CPrimitivo GauzeM.D.   On: 06/01/2020 13:37   CT HEAD WO CONTRAST  Result Date: 06/01/2020 CLINICAL DATA:  Headache, confusion. EXAM: CT HEAD WITHOUT CONTRAST TECHNIQUE: Contiguous axial images were obtained from the base of the skull through the vertex without intravenous contrast. COMPARISON:  March 20, 2020. FINDINGS: Brain: Mild chronic ischemic white matter disease is noted. No mass effect or midline shift is noted. Ventricular size is  within normal limits. There is no evidence of mass lesion, hemorrhage or acute infarction. Vascular: No hyperdense vessel or unexpected calcification. Skull: Normal. Negative for fracture or focal lesion. Sinuses/Orbits: No acute finding. Other: None. IMPRESSION: Mild chronic ischemic white matter disease. No acute intracranial abnormality seen. Electronically Signed   By: JMarijo ConceptionM.D.   On: 06/01/2020 10:23   CT Angio Neck W and/or Wo Contrast  Result Date: 06/01/2020 CLINICAL DATA:  Neuro deficit, acute, stroke suspected EXAM: CT ANGIOGRAPHY HEAD AND NECK TECHNIQUE: Multidetector CT imaging of the head and neck was performed using the standard protocol during bolus administration of intravenous contrast. Multiplanar CT image reconstructions and  MIPs were obtained to evaluate the vascular anatomy. Carotid stenosis measurements (when applicable) are obtained utilizing NASCET criteria, using the distal internal carotid diameter as the denominator. CONTRAST:  144m OMNIPAQUE IOHEXOL 350 MG/ML SOLN COMPARISON:  06/01/2020 head CT.  03/20/2020 MRI head. FINDINGS: CTA NECK FINDINGS Aortic arch: Standard branching. Imaged portion shows no evidence of aneurysm or dissection. No significant stenosis of the major arch vessel origins. Minimal aortic arch atherosclerotic calcifications. Right carotid system: No evidence of dissection, stenosis (50% or greater) or occlusion. Proximal to mid ICA calcified atheromatous plaque with 30% luminal narrowing. Left carotid system: No evidence of dissection, stenosis (50% or greater) or occlusion. Carotid bulb atherosclerotic calcifications with 20% luminal narrowing. Vertebral arteries: Dominant left vertebral artery. No evidence of dissection, stenosis (50% or greater) or occlusion. Skeleton: No acute finding.  Multilevel spondylosis. Other neck: No adenopathy. 1.3 cm enhancing nodule dorsal to the left thyroid (515:154) with an associated polar vessel may reflect a  parathyroid adenoma versus exophytic thyroid nodule. Upper chest: Dependent atelectasis. Review of the MIP images confirms the above findings CTA HEAD FINDINGS Anterior circulation: Patent ICAs. Bilateral carotid siphon atherosclerotic calcifications. Patent ACAs and MCAs. No significant stenosis, proximal occlusion, aneurysm, or vascular malformation. Posterior circulation: Patent V4 segments and PICA. Patent basilar artery. Patent superior cerebellar and posterior cerebral arteries. Fetal origin of the left PCA with mild narrowing of the P2 segment. No proximal occlusion, aneurysm, or vascular malformation. Venous sinuses: As permitted by contrast timing, patent. Anatomic variants: Please see above. Review of the MIP images confirms the above findings IMPRESSION: 1. No large vessel occlusion, hemodynamically significant stenosis, or evidence of dissection. 2. 20-30% luminal narrowing of the proximal right ICA and left carotid bulb 3. Mild narrowing of the left P2 segment. 4. 1.3 cm enhancing nodule dorsal to the left thyroid lobe may reflect a parathyroid adenoma versus exophytic thyroid nodule. Consider outpatient thyroid ultrasound and correlation with endocrine labs for further evaluation. Electronically Signed   By: CPrimitivo GauzeM.D.   On: 06/01/2020 13:37   DG Chest Port 1 View  Result Date: 06/01/2020 CLINICAL DATA:  Altered mental status. EXAM: PORTABLE CHEST 1 VIEW COMPARISON:  March 16, 2008. FINDINGS: The heart size and mediastinal contours are within normal limits. Both lungs are clear. The visualized skeletal structures are unremarkable. IMPRESSION: No active disease. Electronically Signed   By: JMarijo ConceptionM.D.   On: 06/01/2020 10:20    EKG: Independently reviewed.  Sinus rhythm at 59 bpm, PAC noted.  Assessment/Plan Principal Problem:   CV[place] (cerebral vascular [place]) ([place] Active Problems:   Dementia without behavioral disturbance (HCC)   Anxiety   HTN  (hypertension)  TIA vs CVA > Acute confusion and speech difficulties on chronic dementia > CT head negative, CTA head neck with some mild to moderate narrowing. > [facility] > Treating as CVA as lab work otherwise negative - May need neurology consult in the morning, does not appear that they are following - Allow for permissive HTN in the setting of systolic < 2545and diastolic < 1625- Continue daily ASA  - Continue home statin  - Echocardiogram   - A1C  - Lipid panel  - Tele monitoring  - SLP eval - PT/OT  Dementia > Baseline of only mild dementia - Continue home galantamine - Delirium precautions  Anxiety - Continue home Lexapro - Continue as needed Ativan  HTN - Holding home lisinopril, hydrochlorothiazide as above  DVT prophylaxis: Lovenox  Code Status:   Partial Code, No CPR, Okay to intubate Family Communication:  Spoke with wife, Patrick Macias, by phone at [phone]  Disposition Plan:   Patient is from:  Home  Anticipated DC to:  Pending clinical course  Anticipated DC date:  Pending clinical course  Anticipated DC barriers: None  Consults called:  Neurology consulted in ED, does not appear they are following, will need reconsult Admission status:  Observation, telemetry  Severity of Illness: The appropriate patient status for this patient is OBSERVATION. Observation status is judged to be reasonable and necessary in order to provide the required intensity of service to ensure the patient's safety. The patient's presenting symptoms, physical exam findings, and initial radiographic and laboratory data in the context of their medical condition is felt to place them at decreased risk for further clinical deterioration. Furthermore, it is anticipated that the patient will be medically stable for discharge from the hospital within 2 midnights of admission. The following factors support the patient status of observation.    " The patient's presenting symptoms include confusion, speech difficulties. " The physical exam findings include some confusion and difficulty recalling events.  Slow finger-nose difficulty understanding commands for finger-to-nose on right.. " The initial radiographic and laboratory data are narrowing of some arteries on CTA head neck.  Lab work otherwise stable.   Marcelyn Bruins MD [facility]  How to contact the [facility] Attending or Consulting provider [place] or covering provider during after hours [place], for this patient?   1. Check the care team in [facility] and look for a) attending/consulting [facility] provider listed and b) the [facility] team listed 2. Log into [email or web address] and use [place] universal password to access. If you do not have the password, please contact the hospital operator. 3. Locate the [facility] provider you are looking for under [facility] and page to a number that you can be directly reached. 4. If you still have difficulty reaching the provider, please page the [facility] (Director on Call) for the Hospitalists listed on amion for assistance.  06/01/2020, 9:39 PM

## 2020-06-01 NOTE — ED Provider Notes (Signed)
[facility]PART[facility] Provider Note   CSN: [id number] Arrival date & time: 06/01/20  0909     History Chief Complaint  Patient presents with  . Headache  . Altered Mental Status    Patrick Macias is a 79 y.o. male.  Patient last evening had what was described as a severe headache.  Specific location not known.  Patient went to bed around 9:00 in the evening.  Woke up this morning with some speech difficulties.  Was having difficulty finding words.  But speech was not slurred.  Patient also seem to be more confused than baseline from his dementia.  Patient actually put on 2 different types of shoes.  Patient has a known dementia.  About 2 weeks ago did have a change in one of his dementia medications his galantamine was increased from 8 mg to 16 mg.  But no changes recently.  No headache currently.  Family did not notice any weakness.        Past Medical History:  Diagnosis Date  . Anxiety   . BPH (benign prostatic hyperplasia)   . Cerumen impaction   . Dyslipidemia   . H/O lithotripsy   . Hearing decreased, bilateral   . Hx of colonic polyps   . Hypertension   . IFG (impaired fasting glucose)   . Macrocytosis   . Nephrolithiasis   . Vitamin D deficiency     Patient Active Problem List   Diagnosis Date Noted  . CVA (cerebral vascular accident) ([place]) 06/01/2020  . Right shoulder pain 08/10/2013    Past Surgical History:  Procedure Laterality Date  . CATARACT EXTRACTION, BILATERAL    . PROSTATECTOMY         Family History  Problem Relation Age of Onset  . Sudden death Neg Hx   . Hypertension Neg Hx   . Hyperlipidemia Neg Hx   . Diabetes Neg Hx   . Heart attack Neg Hx     Social History   Tobacco Use  . Smoking status: Never Smoker  . Smokeless tobacco: Never Used  Substance Use Topics  . Alcohol use: Not Currently  . Drug use: Never    Home Medications Prior to Admission medications   Medication Sig Start Date End Date  Taking? Authorizing Provider  aspirin EC 81 MG tablet Take 1 tablet (81 mg total) by mouth daily. 05/11/19  Yes Belva Crome, MD  Cholecalciferol (VITAMIN D-3) 125 MCG (5000 UT) TABS Take 1 tablet by mouth 3 (three) times a week.    Yes [provider]  escitalopram (LEXAPRO) 20 MG tablet Take 20 mg by mouth daily.   Yes [provider]  galantamine (RAZADYNE ER) 16 MG 24 hr capsule Take 16 mg by mouth daily with breakfast.   Yes [provider]  lisinopril-hydrochlorothiazide (ZESTORETIC) 20-25 MG tablet Take 1 tablet by mouth daily. 04/08/19  Yes [provider]  rosuvastatin (CRESTOR) 20 MG tablet Take 20 mg by mouth at bedtime. 05/03/19  Yes [provider]    Allergies    Patient has no known allergies.  Review of Systems   Review of Systems  Unable to perform ROS: Mental status change    Physical Exam Updated Vital Signs BP 133/75 (BP Location: Left Arm)   Pulse (!) 52   Temp 98.6 F (37 C) (Oral)   Resp 17   Ht 1.803 m (5\' 11" )   Wt 77.1 kg   SpO2 99%   BMI 23.71 kg/m  Physical Exam Vitals and nursing note reviewed.  Constitutional:      Appearance: Normal appearance. He is well-developed.  HENT:     Head: Normocephalic and atraumatic.  Eyes:     Extraocular Movements: Extraocular movements intact.     Conjunctiva/sclera: Conjunctivae normal.     Pupils: Pupils are equal, round, and reactive to light.  Cardiovascular:     Rate and Rhythm: Normal rate and regular rhythm.     Heart sounds: No murmur heard.   Pulmonary:     Effort: Pulmonary effort is normal. No respiratory distress.     Breath sounds: Normal breath sounds.  Abdominal:     Palpations: Abdomen is soft.     Tenderness: There is no abdominal tenderness.  Musculoskeletal:        General: No swelling. Normal range of motion.     Cervical back: Normal range of motion and neck supple.  Skin:    General: Skin is warm and dry.     Capillary Refill:  Capillary refill takes less than 2 seconds.  Neurological:     General: No focal deficit present.     Mental Status: He is alert and oriented to person, place, and time.     Cranial Nerves: Cranial nerve deficit present.     Sensory: No sensory deficit.     Motor: No weakness.     Comments: No motor weakness.  No obvious sensory weakness.  No pronator drift.  Some concerns about him still grasping for words.  Also family seems to think that the short-term memory is worse than usual.     ED Results / Procedures / Treatments   Labs (all labs ordered are listed, but only abnormal results are displayed) Labs Reviewed  CBC - Abnormal; Notable for the following components:      Result Value   RBC 3.83 (*)    HCT 37.9 (*)    MCH 35.0 (*)    All other components within normal limits  COMPREHENSIVE METABOLIC PANEL - Abnormal; Notable for the following components:   Sodium 133 (*)    Glucose, Bld 136 (*)    Calcium 8.8 (*)    Alkaline Phosphatase 34 (*)    Total Bilirubin 1.3 (*)    All other components within normal limits  URINALYSIS, ROUTINE W REFLEX MICROSCOPIC - Abnormal; Notable for the following components:   Hgb urine dipstick TRACE (*)    All other components within normal limits  URINALYSIS, MICROSCOPIC (REFLEX) - Abnormal; Notable for the following components:   Bacteria, UA MANY (*)    All other components within normal limits  CBG MONITORING, ED - Abnormal; Notable for the following components:   Glucose-Capillary 122 (*)    All other components within normal limits  RESP PANEL BY RT-PCR (FLU A&B, COVID) ARPGX2  DIFFERENTIAL  RAPID URINE DRUG SCREEN, HOSP PERFORMED  PROTIME-INR  APTT  SEDIMENTATION RATE  CBG MONITORING, ED    EKG EKG Interpretation  Date/Time:  Friday June 01 2020 09:37:47 EST Ventricular Rate:  59 PR Interval:    QRS Duration: 101 QT Interval:  440 QTC Calculation: 436 R Axis:   -12 Text Interpretation: Sinus rhythm Atrial premature  complex Confirmed by Fredia Sorrow [phone]) on 06/01/2020 9:56:50 AM   Radiology CT Angio Head W or Wo Contrast  Result Date: 06/01/2020 CLINICAL DATA:  Neuro deficit, acute, stroke suspected EXAM: CT ANGIOGRAPHY HEAD AND NECK TECHNIQUE: Multidetector CT imaging of the head and neck was performed using the  standard protocol during bolus administration of intravenous contrast. Multiplanar CT image reconstructions and MIPs were obtained to evaluate the vascular anatomy. Carotid stenosis measurements (when applicable) are obtained utilizing NASCET criteria, using the distal internal carotid diameter as the denominator. CONTRAST:  161mL OMNIPAQUE IOHEXOL 350 MG/ML SOLN COMPARISON:  06/01/2020 head CT.  03/20/2020 MRI head. FINDINGS: CTA NECK FINDINGS Aortic arch: Standard branching. Imaged portion shows no evidence of aneurysm or dissection. No significant stenosis of the major arch vessel origins. Minimal aortic arch atherosclerotic calcifications. Right carotid system: No evidence of dissection, stenosis (50% or greater) or occlusion. Proximal to mid ICA calcified atheromatous plaque with 30% luminal narrowing. Left carotid system: No evidence of dissection, stenosis (50% or greater) or occlusion. Carotid bulb atherosclerotic calcifications with 20% luminal narrowing. Vertebral arteries: Dominant left vertebral artery. No evidence of dissection, stenosis (50% or greater) or occlusion. Skeleton: No acute finding.  Multilevel spondylosis. Other neck: No adenopathy. 1.3 cm enhancing nodule dorsal to the left thyroid (515:154) with an associated polar vessel may reflect a parathyroid adenoma versus exophytic thyroid nodule. Upper chest: Dependent atelectasis. Review of the MIP images confirms the above findings CTA HEAD FINDINGS Anterior circulation: Patent ICAs. Bilateral carotid siphon atherosclerotic calcifications. Patent ACAs and MCAs. No significant stenosis, proximal occlusion, aneurysm, or vascular  malformation. Posterior circulation: Patent V4 segments and PICA. Patent basilar artery. Patent superior cerebellar and posterior cerebral arteries. Fetal origin of the left PCA with mild narrowing of the P2 segment. No proximal occlusion, aneurysm, or vascular malformation. Venous sinuses: As permitted by contrast timing, patent. Anatomic variants: Please see above. Review of the MIP images confirms the above findings IMPRESSION: 1. No large vessel occlusion, hemodynamically significant stenosis, or evidence of dissection. 2. 20-30% luminal narrowing of the proximal right ICA and left carotid bulb 3. Mild narrowing of the left P2 segment. 4. 1.3 cm enhancing nodule dorsal to the left thyroid lobe may reflect a parathyroid adenoma versus exophytic thyroid nodule. Consider outpatient thyroid ultrasound and correlation with endocrine labs for further evaluation. Electronically Signed   By: Primitivo Gauze M.D.   On: 06/01/2020 13:37   CT HEAD WO CONTRAST  Result Date: 06/01/2020 CLINICAL DATA:  Headache, confusion. EXAM: CT HEAD WITHOUT CONTRAST TECHNIQUE: Contiguous axial images were obtained from the base of the skull through the vertex without intravenous contrast. COMPARISON:  March 20, 2020. FINDINGS: Brain: Mild chronic ischemic white matter disease is noted. No mass effect or midline shift is noted. Ventricular size is within normal limits. There is no evidence of mass lesion, hemorrhage or acute infarction. Vascular: No hyperdense vessel or unexpected calcification. Skull: Normal. Negative for fracture or focal lesion. Sinuses/Orbits: No acute finding. Other: None. IMPRESSION: Mild chronic ischemic white matter disease. No acute intracranial abnormality seen. Electronically Signed   By: Marijo Conception M.D.   On: 06/01/2020 10:23   CT Angio Neck W and/or Wo Contrast  Result Date: 06/01/2020 CLINICAL DATA:  Neuro deficit, acute, stroke suspected EXAM: CT ANGIOGRAPHY HEAD AND NECK TECHNIQUE:  Multidetector CT imaging of the head and neck was performed using the standard protocol during bolus administration of intravenous contrast. Multiplanar CT image reconstructions and MIPs were obtained to evaluate the vascular anatomy. Carotid stenosis measurements (when applicable) are obtained utilizing NASCET criteria, using the distal internal carotid diameter as the denominator. CONTRAST:  12mL OMNIPAQUE IOHEXOL 350 MG/ML SOLN COMPARISON:  06/01/2020 head CT.  03/20/2020 MRI head. FINDINGS: CTA NECK FINDINGS Aortic arch: Standard branching. Imaged portion shows no evidence of  aneurysm or dissection. No significant stenosis of the major arch vessel origins. Minimal aortic arch atherosclerotic calcifications. Right carotid system: No evidence of dissection, stenosis (50% or greater) or occlusion. Proximal to mid ICA calcified atheromatous plaque with 30% luminal narrowing. Left carotid system: No evidence of dissection, stenosis (50% or greater) or occlusion. Carotid bulb atherosclerotic calcifications with 20% luminal narrowing. Vertebral arteries: Dominant left vertebral artery. No evidence of dissection, stenosis (50% or greater) or occlusion. Skeleton: No acute finding.  Multilevel spondylosis. Other neck: No adenopathy. 1.3 cm enhancing nodule dorsal to the left thyroid (515:154) with an associated polar vessel may reflect a parathyroid adenoma versus exophytic thyroid nodule. Upper chest: Dependent atelectasis. Review of the MIP images confirms the above findings CTA HEAD FINDINGS Anterior circulation: Patent ICAs. Bilateral carotid siphon atherosclerotic calcifications. Patent ACAs and MCAs. No significant stenosis, proximal occlusion, aneurysm, or vascular malformation. Posterior circulation: Patent V4 segments and PICA. Patent basilar artery. Patent superior cerebellar and posterior cerebral arteries. Fetal origin of the left PCA with mild narrowing of the P2 segment. No proximal occlusion, aneurysm, or  vascular malformation. Venous sinuses: As permitted by contrast timing, patent. Anatomic variants: Please see above. Review of the MIP images confirms the above findings IMPRESSION: 1. No large vessel occlusion, hemodynamically significant stenosis, or evidence of dissection. 2. 20-30% luminal narrowing of the proximal right ICA and left carotid bulb 3. Mild narrowing of the left P2 segment. 4. 1.3 cm enhancing nodule dorsal to the left thyroid lobe may reflect a parathyroid adenoma versus exophytic thyroid nodule. Consider outpatient thyroid ultrasound and correlation with endocrine labs for further evaluation. Electronically Signed   By: Primitivo Gauze M.D.   On: 06/01/2020 13:37   DG Chest Port 1 View  Result Date: 06/01/2020 CLINICAL DATA:  Altered mental status. EXAM: PORTABLE CHEST 1 VIEW COMPARISON:  March 16, 2008. FINDINGS: The heart size and mediastinal contours are within normal limits. Both lungs are clear. The visualized skeletal structures are unremarkable. IMPRESSION: No active disease. Electronically Signed   By: Marijo Conception M.D.   On: 06/01/2020 10:20    Procedures Procedures (including critical care time)  CRITICAL CARE Performed by: Fredia Sorrow Total critical care time: 45 minutes Critical care time was exclusive of separately billable procedures and treating other patients. Critical care was necessary to treat or prevent imminent or life-threatening deterioration. Critical care was time spent personally by me on the following activities: development of treatment plan with patient and/or surrogate as well as nursing, discussions with consultants, evaluation of patient's response to treatment, examination of patient, obtaining history from patient or surrogate, ordering and performing treatments and interventions, ordering and review of laboratory studies, ordering and review of radiographic studies, pulse oximetry and re-evaluation of patient's  condition.   Medications Ordered in ED Medications  LORazepam (ATIVAN) tablet 0.5 mg (has no administration in time range)  iohexol (OMNIPAQUE) 350 MG/ML injection 100 mL (100 mLs Intravenous Contrast Given 06/01/20 1250)    ED Course  I have reviewed the triage vital signs and the nursing notes.  Pertinent labs & imaging results that were available during my care of the patient were reviewed by me and considered in my medical decision making (see chart for details).    MDM Rules/Calculators/A&P                          Timewise last known normal would have been in 9 PM last night.  Patient with some  speech difficulties which seem to be improving.  But no other large vessel stroke type symptoms.  Head CT was negative thought there may be evidence of a head bleed based on the headache from yesterday.  CT angios also negative for any large vessel abnormalities.  Patient did seem to have some improvement here blood pressure has not been significantly high so do not think it is a hypertensive emergency.  Patient family still feels there is some memory problems and that occasionally there is some speech problems.  Discussed with on-call neuro hospitalist.  I recommends MRI and or admission.  Discussed with the hospitalist they will admit and get MRI as an inpatient.    Final Clinical Impression(s) / ED Diagnoses Final diagnoses:  Dementia without behavioral disturbance, unspecified dementia type ([place])  Cerebrovascular accident (CVA), unspecified mechanism ([place])    Rx / [facility] Orders ED Discharge Orders    None       Fredia Sorrow, MD 06/01/20 1658

## 2020-06-01 NOTE — ED Notes (Signed)
Pt aware of need for urine specimen, unable to provide at this time.  Transported to CT

## 2020-06-01 NOTE — ED Triage Notes (Signed)
Pts daughter states pt had a severe headache yesterday and today is very confused. Pt was recently diagnosed with mild memory loss but today is worse.

## 2020-06-01 NOTE — Plan of Care (Signed)
  Problem: Education: Goal: Knowledge of secondary prevention will improve Outcome: Progressing Goal: Knowledge of patient specific risk factors addressed and post discharge goals established will improve Outcome: Progressing Goal: Individualized Educational Video(s) Outcome: Progressing   Problem: Education: Goal: Knowledge of secondary prevention will improve Outcome: Progressing Goal: Knowledge of patient specific risk factors addressed and post discharge goals established will improve Outcome: Progressing Goal: Individualized Educational Video(s) Outcome: Progressing

## 2020-06-01 NOTE — ED Notes (Signed)
Patient transported to CT 

## 2020-06-01 NOTE — ED Notes (Signed)
Call to unit to provide report, receiving nurse unavailable at this time, message left to return call.

## 2020-06-02 ENCOUNTER — Observation Stay (HOSPITAL_BASED_OUTPATIENT_CLINIC_OR_DEPARTMENT_OTHER): Payer: Medicare HMO

## 2020-06-02 ENCOUNTER — Observation Stay (HOSPITAL_COMMUNITY): Payer: Medicare HMO

## 2020-06-02 DIAGNOSIS — G459 Transient cerebral ischemic attack, unspecified: Secondary | ICD-10-CM | POA: Diagnosis not present

## 2020-06-02 DIAGNOSIS — I6389 Other cerebral infarction: Secondary | ICD-10-CM

## 2020-06-02 DIAGNOSIS — R479 Unspecified speech disturbances: Secondary | ICD-10-CM | POA: Diagnosis not present

## 2020-06-02 DIAGNOSIS — R69 Illness, unspecified: Secondary | ICD-10-CM | POA: Diagnosis not present

## 2020-06-02 DIAGNOSIS — R410 Disorientation, unspecified: Secondary | ICD-10-CM | POA: Diagnosis not present

## 2020-06-02 LAB — LIPID PANEL
Cholesterol: 86 mg/dL (ref 0–200)
HDL: 36 mg/dL — ABNORMAL LOW (ref >40)
LDL Cholesterol: 32 mg/dL (ref 0–99)
Total CHOL/HDL Ratio: 2.4 RATIO
Triglycerides: 91 mg/dL (ref <150)
VLDL: 18 mg/dL (ref 0–40)

## 2020-06-02 LAB — ECHOCARDIOGRAM COMPLETE
Area-P 1/2: 2.48 cm2
Calc EF: 65.5 %
Height: 71 in
S' Lateral: 3 cm
Single Plane A2C EF: 63.3 %
Single Plane A4C EF: 66.5 %
Weight: 2720 oz

## 2020-06-02 LAB — HEMOGLOBIN A1C
Hgb A1c MFr Bld: 5.1 % (ref 4.8–5.6)
Mean Plasma Glucose: 99.67 mg/dL

## 2020-06-02 IMAGING — MR MR HEAD W/O CM
14 of 16 series · 34 of 48 positions shown · non-contrast
Comparison: MRI head [DATE]

CLINICAL DATA: Stroke follow-up. Dementia, hypertension,
hyperlipidemia. Confusion and speech difficulty today.

EXAM:
MRI HEAD WITHOUT CONTRAST
TECHNIQUE: Multiplanar, multiecho pulse sequences of the brain and surrounding
structures were obtained without intravenous contrast.

[Series 5: DWI · axial · 3.0mm · 0.88mm/px · z∈[-22,+118]mm · 5 of 100 slices shown (1 of 4)]
[im 1/100]
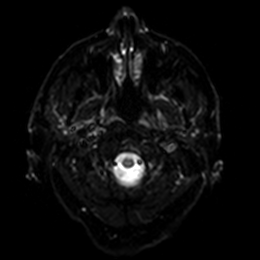
[im 25/100]
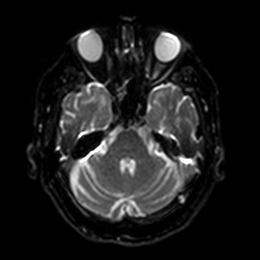
[im 50/100]
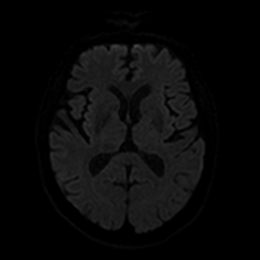
[im 75/100]
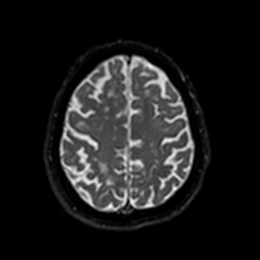
[im 100/100]
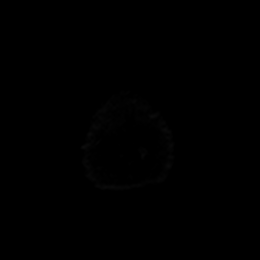

[Series 6: DWI · axial · 3.0mm · 0.88mm/px · z∈[-22,+118]mm · 3 of 49 slices shown (2 of 4)]
[im 1/49]
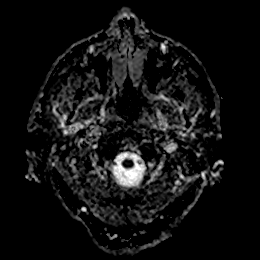
[im 25/49]
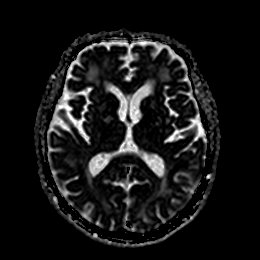
[im 49/49]
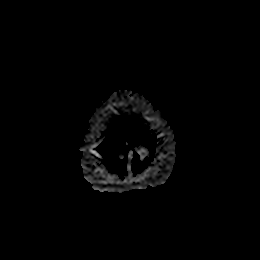

[Series 7: DWI · coronal · 4.0mm · 0.88mm/px · 3 of 68 slices shown (3 of 4)]
[im 1/68]
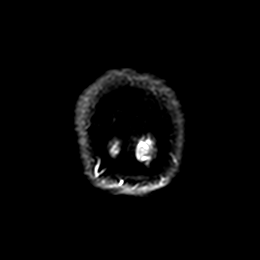
[im 34/68]
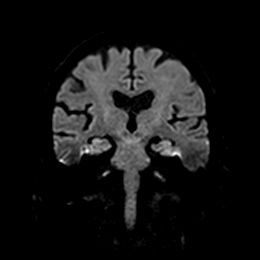
[im 68/68]
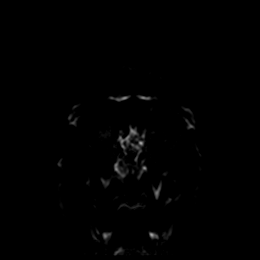

[Series 8: DWI · coronal · 4.0mm · 0.88mm/px · 1 of 34 slices shown (4 of 4)]
[im 1/34]
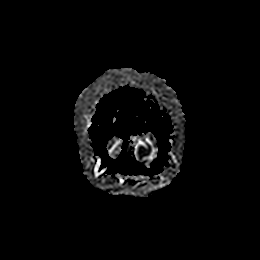

[Series 9: T1 · sagittal · 5.0mm · 0.75mm/px · 1 of 27 slices shown]
[im 1/27]
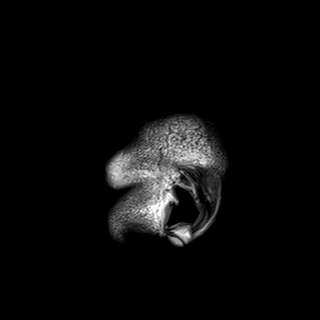

[Series 10: T2 · axial · 5.0mm · 0.72mm/px · 1 of 27 slices shown (1 of 2)]
[im 1/27]
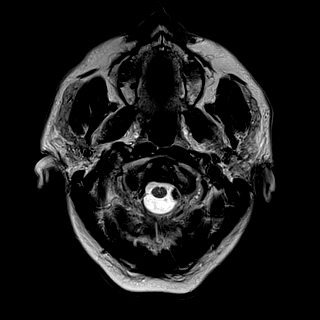

[Series 11: FLAIR · axial · 5.0mm · 0.45mm/px · 1 of 27 slices shown]
[im 1/27]
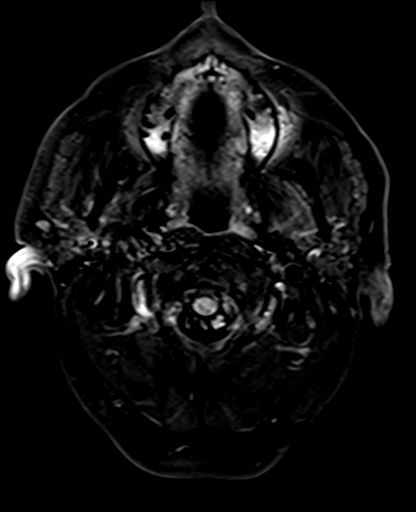

[Series 12: mag_images · axial · 3.0mm · 0.90mm/px · z∈[-38,+120]mm · 2 of 56 slices shown]
[im 1/56]
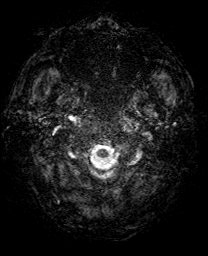
[im 56/56]
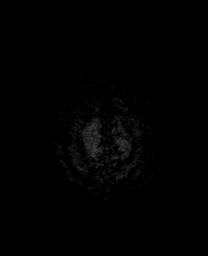

[Series 13: pha_images · axial · 3.0mm · 0.90mm/px · z∈[-38,+120]mm · 2 of 55 slices shown]
[im 1/55]
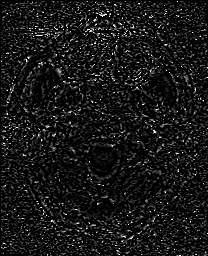
[im 55/55]
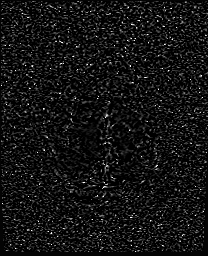

[Series 14: swi_images · axial · 3.0mm · 0.90mm/px · z∈[-38,+120]mm · 2 of 56 slices shown]
[im 1/56]
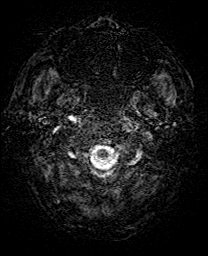
[im 56/56]
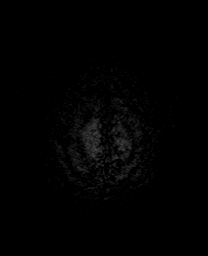

[Series 15: mip_images(sw) · axial · 24.0mm · 0.90mm/px · z∈[-28,+110]mm · 2 of 49 slices shown]
[im 1/49]
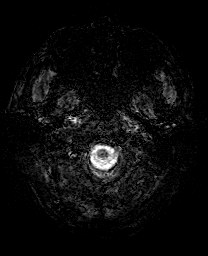
[im 49/49]
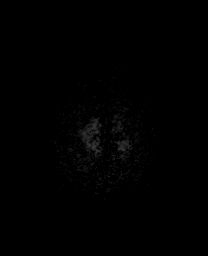

[Series 17: T2 · coronal · 5.0mm · 0.34mm/px · 1 of 30 slices shown (2 of 2)]
[im 1/30]
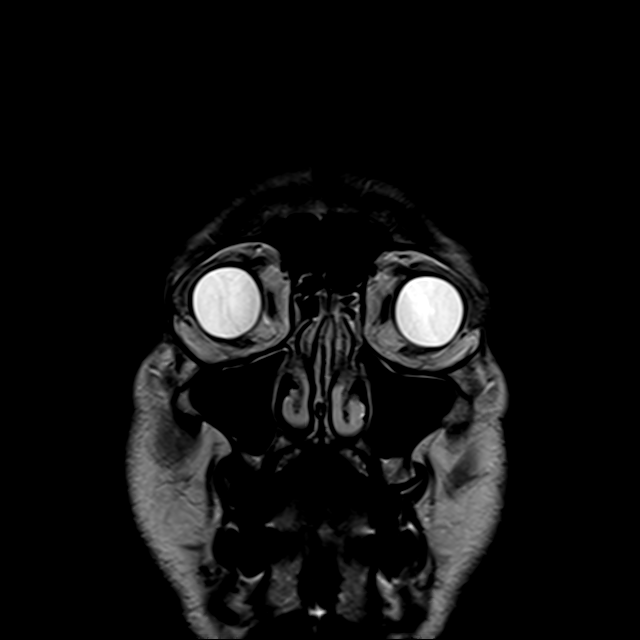

[Series 18: t2_space_dark-fluid_sag_p2_ns-ir · sagittal · 1.0mm · 0.49mm/px · 7 of 160 slices shown]
[im 1/160]
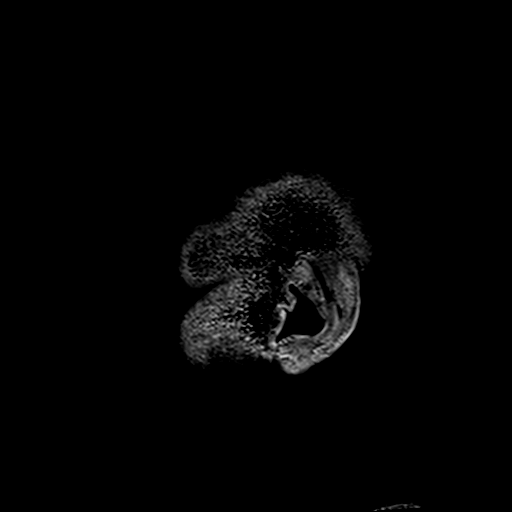
[im 27/160]
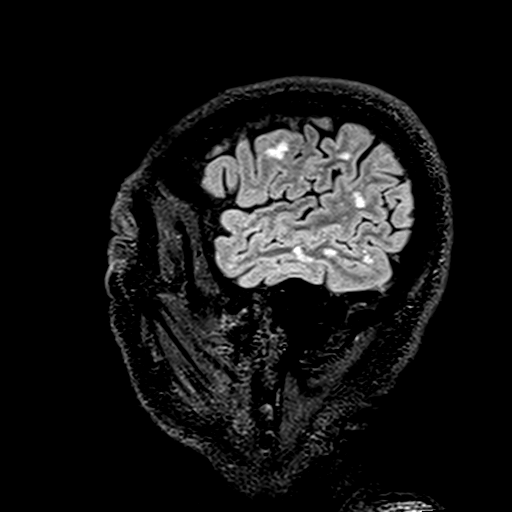
[im 54/160]
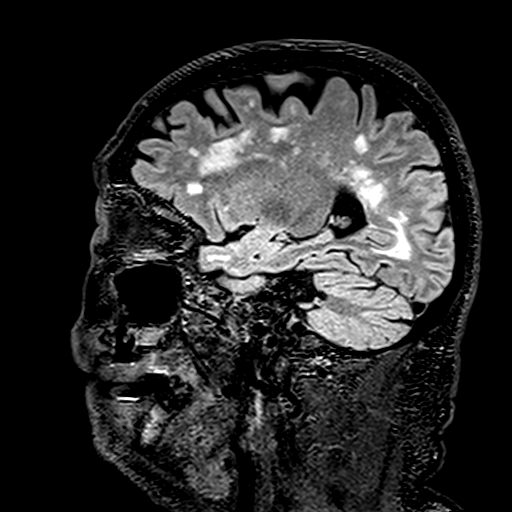
[im 80/160]
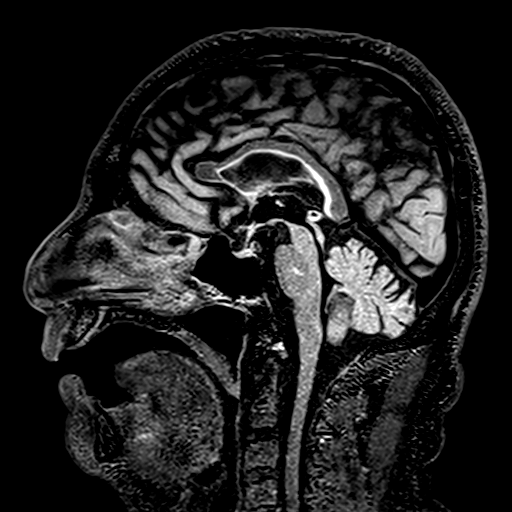
[im 107/160]
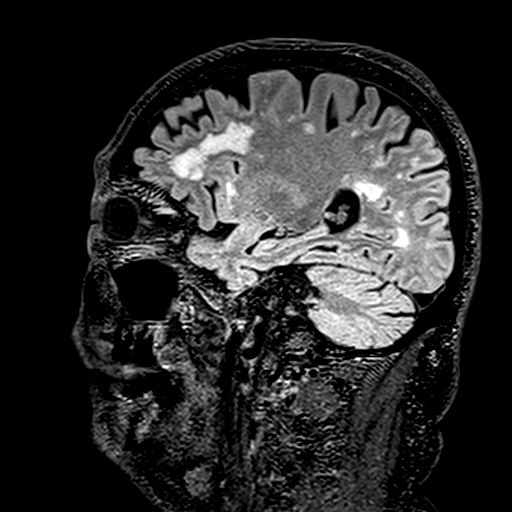
[im 133/160]
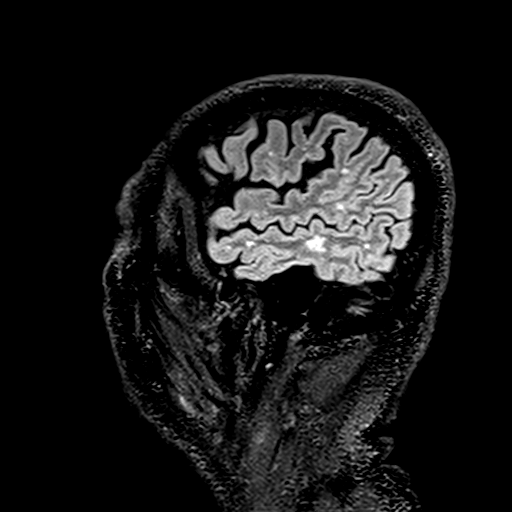
[im 160/160]
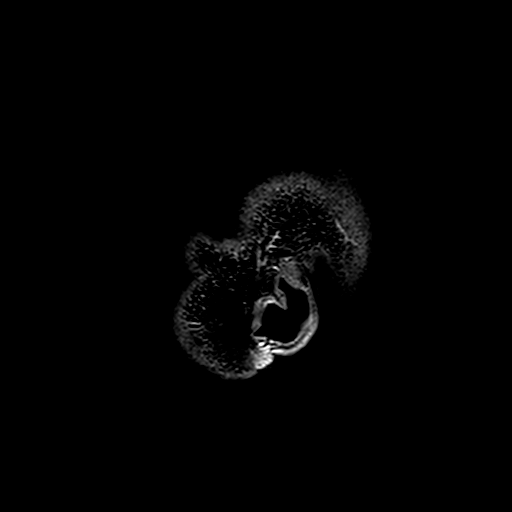

[Series 19: t2_space_dark-fluid_sag_p2_ns-ir_mpr_ axial · axial · 1.0mm · 0.45mm/px · z∈[-31,+20]mm · 3 of 161 slices shown]
[im 1/161]
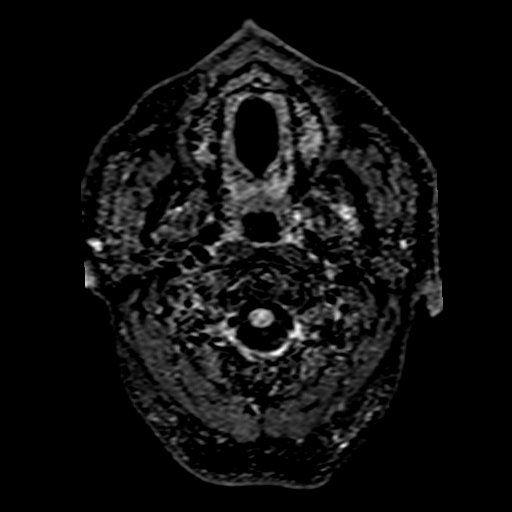
[im 27/161]
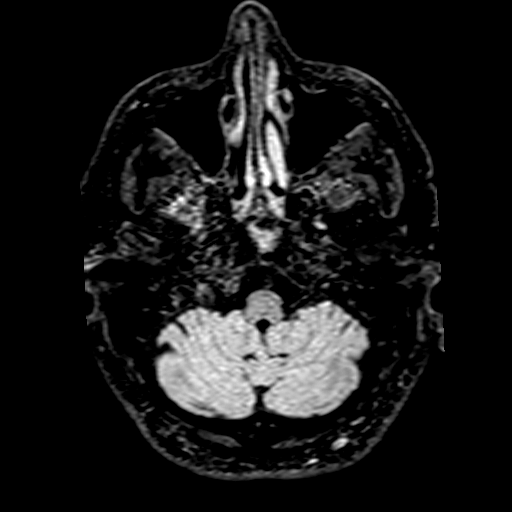
[im 54/161]
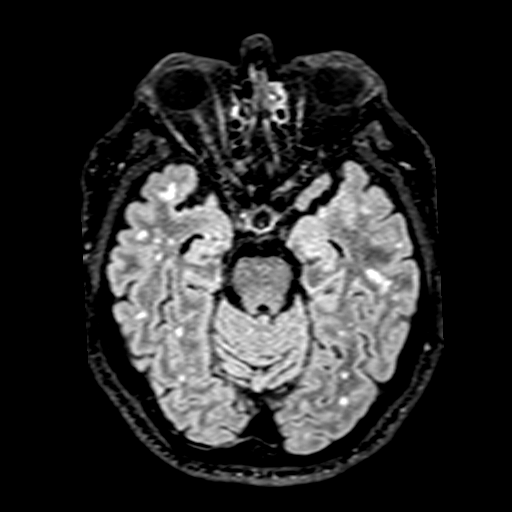

[34 of 48 positions shown; findings below may reference images not displayed]

FINDINGS: Brain: Negative for acute infarct. Moderate to advanced chronic
white matter changes. Patchy periventricular deep white matter
hyperintensity bilaterally. Small hyperintensity right paramedian
pons. Negative for hemorrhage or mass. Cerebral volume normal for
age. Negative for hydrocephalus.

Vascular: Normal arterial flow voids

Skull and upper cervical spine: No focal skeletal abnormality.

Sinuses/Orbits: Mild mucosal edema paranasal sinuses. Bilateral
cataract extraction.

Other: None
IMPRESSION: Negative for acute infarct.

Moderate to advanced chronic white matter changes most consistent
with chronic microvascular ischemia.

## 2020-06-02 MED ORDER — CLOPIDOGREL BISULFATE 75 MG PO TABS
75.0000 mg | ORAL_TABLET | Freq: Every day | ORAL | Status: DC
  Administered 2020-06-02: 75 mg via ORAL
  Filled 2020-06-02: qty 1

## 2020-06-02 MED ORDER — CLOPIDOGREL BISULFATE 75 MG PO TABS: 75.0000 mg | ORAL_TABLET | Freq: Every day | ORAL | 0 refills | Status: AC

## 2020-06-02 NOTE — Evaluation (Addendum)
Occupational Therapy Evaluation/Discharge Patient Details Name: OMERO KOWAL MRN: [id number] DOB: July 03, 1940 Today's Date: 06/02/2020    History of Present Illness  LONG BRIMAGE is a 79 y.o. male with medical history significant of dementia, hypertension, hyperlipidemia, anxiety who presents after his family noticed he had increased confusion and speech difficulties this morning.  Patient was last seen normal at 9 PM the night prior. CT and MRI negative.   Clinical Impression   PTA, pt lives with wife and reports Independence with ADLs and mobility without use of AD. Pt also reports he still drives and works, but unsure of accuracy due to hx of dementia at baseline. Pt presents now with no overt balance deficits during short distance mobility without use of AD. Pt requires no more than Supervision for ADLs/mobility due to intermittent cues needed for sequencing. Pt noted with some short term memory deficits during session, but this may be pt's cognitive baseline. Pt able to converse appropriately during functional tasks, vision WNL and B UE strength 4+/5. Recommend initial supervision for showering tasks and assistance with med mgmt for safety at home. Otherwise, pt appears at baseline and no further skilled OT services needed at the acute level. OT to sign off.     Follow Up Recommendations  No OT follow up;Supervision - Intermittent    Equipment Recommendations  None recommended by OT    Recommendations for Other Services       Precautions / Restrictions Precautions Precautions: Fall Restrictions Weight Bearing Restrictions: No      Mobility Bed Mobility Overal bed mobility: Modified Independent                  Transfers Overall transfer level: Independent Equipment used: None             General transfer comment: Independent for sit to stand transfers without use of AD, no overt LOB    Balance Overall balance assessment: No apparent balance deficits (not  formally assessed)                                         ADL either performed or assessed with clinical judgement   ADL Overall ADL's : Needs assistance/impaired Eating/Feeding: Independent;Sitting   Grooming: Supervision/safety;Standing;Brushing hair Grooming Details (indicate cue type and reason): no LOB or UE support needed standing at sink Upper Body Bathing: Independent;Sitting   Lower Body Bathing: Supervison/ safety;Sitting/lateral leans;Sit to/from stand   Upper Body Dressing : Supervision/safety;Sitting Upper Body Dressing Details (indicate cue type and reason): Supervision with cueing for problem solving doning hospital gown  Lower Body Dressing: Supervision/safety;Sit to/from stand;Sitting/lateral leans   Toilet Transfer: Supervision/safety;Ambulation Toilet Transfer Details (indicate cue type and reason): simulated in room Toileting- Clothing Manipulation and Hygiene: Supervision/safety;Sit to/from stand;Sitting/lateral lean       Functional mobility during ADLs: Supervision/safety General ADL Comments: Pt requires supervision for safety and intermittent cueing for problem solving. would likely do better in home environment. no overt LOB     Vision Baseline Vision/History: Wears glasses Wears Glasses: At all times Patient Visual Report: No change from baseline Vision Assessment?: No apparent visual deficits     Perception     Praxis      Pertinent Vitals/Pain Pain Assessment: No/denies pain     Hand Dominance Left   Extremity/Trunk Assessment Upper Extremity Assessment Upper Extremity Assessment: Overall WFL for tasks assessed   Lower Extremity  Assessment Lower Extremity Assessment: Defer to PT evaluation   Cervical / Trunk Assessment Cervical / Trunk Assessment: Normal   Communication Communication Communication: HOH   Cognition Arousal/Alertness: Awake/alert Behavior During Therapy: WFL for tasks assessed/performed Overall  Cognitive Status: History of cognitive impairments - at baseline Area of Impairment: Orientation;Memory;Awareness;Problem solving                 Orientation Level: Time   Memory: Decreased short-term memory     Awareness: Emergent Problem Solving: Difficulty sequencing;Requires verbal cues General Comments: hx of dementia at baseline, no family at bedside. Pt overall able to verbalize reason for hospitalization. Noted memory deficits during session, increased time for orientation questions. likely at baseline.    General Comments  VSS on RA. Pt with no overt difficulties with speech, vision and strength [facility]    Exercises     Shoulder Instructions      Home Living Family/patient expects to be discharged to:: Private residence Living Arrangements: Spouse/significant other Available Help at Discharge: Family;Available PRN/intermittently Type of Home: House Home Access: Stairs to enter [employer] of Steps: 2-3 Entrance Stairs-Rails: Right;Left Home Layout: One level     Bathroom Shower/Tub: Walk-in shower;Tub/shower unit   Bathroom Toilet: Handicapped height     Home Equipment: Cane - single point;Shower seat - built in   Additional Comments: Pt reports built in seat in tub/shower, unable to recall walk in shower setup (reports "portable" but unable to describe this equipment thoroughly)      Prior Functioning/Environment Level of Independence: Independent        Comments: Pt reports no use of AD for mobility, able to complete ADLs. wife completes IADLs in the home. 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, quantity - unsure of PLOF accuracy as pt with hx of dementia/memory issues        OT Problem List:        OT Treatment/Interventions:      OT Goals(Current goals can be found in the care plan section) Acute Rehab OT Goals Patient Stated Goal: talk to doctor, go home today OT Goal Formulation: All assessment and education complete, DC  therapy Time For Goal Achievement: 06/02/20  OT Frequency:     Barriers to D/C:            Co-evaluation              AM-PAC OT "6 Clicks" Daily Activity     Outcome Measure Help from another person eating meals?: None Help from another person taking care of personal grooming?: A Little Help from another person toileting, which includes using toliet, bedpan, or urinal?: A Little Help from another person bathing (including washing, rinsing, drying)?: A Little Help from another person to put on and taking off regular upper body clothing?: None Help from another person to put on and taking off regular lower body clothing?: A Little 6 Click Score: 20   End of Session    Activity Tolerance: Patient tolerated treatment well Patient left: in bed;with call bell/phone within reach;with bed alarm set                   Time: 0712-0744 OT Time Calculation (min): 32 min Charges:  OT Evaluation $OT Eval Low Complexity: 1 Low OT Treatments $Self Care/Home Management : 8-22 mins  Layla Maw, OTR/L  Layla Maw 06/02/2020, 8:03 AM

## 2020-06-02 NOTE — Evaluation (Signed)
Physical Therapy Evaluation Patient Details Name: Patrick Macias MRN: [id number] DOB: Aug 23, 1940 Today's Date: 06/02/2020   History of Present Illness   Patrick Macias is a 79 y.o. male with medical history significant of dementia, hypertension, hyperlipidemia, anxiety who presents after his family noticed he had increased confusion and speech difficulties this morning.  Patient was last seen normal at 9 PM the night prior. CT and MRI negative.  Clinical Impression  Pt presents to PT with no significant functional deficits at this time. Pt is able to perform all mobility required in the home independently without apparent deficit. Pt does demonstrate some slowed processing intermittently during session, however this may be partially related to him being hard of hearing and not having hearing aides in. Pt has no further acute PT needs at this time. PT recommends discharge home with no PT or DME needs.    Follow Up Recommendations No PT follow up;Supervision - Intermittent    Equipment Recommendations  None recommended by PT    Recommendations for Other Services       Precautions / Restrictions Precautions Precautions: None Restrictions Weight Bearing Restrictions: No      Mobility  Bed Mobility Overal bed mobility: Modified Independent             General bed mobility comments: HOB elevated    Transfers Overall transfer level: Independent Equipment used: None                Ambulation/Gait Ambulation/Gait assistance: Independent Gait Distance (Feet): 250 Feet Assistive device: None Gait Pattern/deviations: WFL(Within Functional Limits) Gait velocity: functional Gait velocity interpretation: >2.62 ft/sec, indicative of community ambulatory General Gait Details: pt performs head turns, stops abruptly, turns quickly, weaves between objects, all without LOB  Stairs Stairs: Yes Stairs assistance: Modified independent (Device/Increase time) Stair Management:  One rail Right;Alternating pattern Number of Stairs: 2    Wheelchair Mobility    Modified Rankin (Stroke Patients Only)       Balance Overall balance assessment: Independent                                           Pertinent Vitals/Pain Pain Assessment: No/denies pain    Home Living Family/patient expects to be discharged to:: Private residence Living Arrangements: Spouse/significant other Available Help at Discharge: Family;Available PRN/intermittently Type of Home: House Home Access: Stairs to enter Entrance Stairs-Rails: Psychiatric nurse of Steps: 2-3 Home Layout: One level Home Equipment: Cane - single point;Shower seat - built in      Prior Function Level of Independence: Independent               Hand Dominance   Dominant Hand: Left    Extremity/Trunk Assessment   Upper Extremity Assessment Upper Extremity Assessment: Overall WFL for tasks assessed    Lower Extremity Assessment Lower Extremity Assessment: Overall WFL for tasks assessed    Cervical / Trunk Assessment Cervical / Trunk Assessment: Normal  Communication   Communication: HOH  Cognition Arousal/Alertness: Awake/alert Behavior During Therapy: WFL for tasks assessed/performed Overall Cognitive Status: History of cognitive impairments - at baseline                                 General Comments: pt follows commands well, cognition is WFL at this time other than some slowed processing which  may be more related to hearing deficits than cognitive deficits      General Comments General comments (skin integrity, edema, etc.): VSS on RA    Exercises     Assessment/Plan    PT Assessment Patent does not need any further PT services  PT Problem List         PT Treatment Interventions      PT Goals (Current goals can be found in the Care Plan section)       Frequency     Barriers to discharge        Co-evaluation                AM-PAC PT "6 Clicks" Mobility  Outcome Measure Help needed turning from your back to your side while in a flat bed without using bedrails?: None Help needed moving from lying on your back to sitting on the side of a flat bed without using bedrails?: None Help needed moving to and from a bed to a chair (including a wheelchair)?: None Help needed standing up from a chair using your arms (e.g., wheelchair or bedside chair)?: None Help needed to walk in hospital room?: None Help needed climbing 3-5 steps with a railing? : None 6 Click Score: 24    End of Session   Activity Tolerance: Patient tolerated treatment well Patient left: in bed;with call bell/phone within reach;with family/visitor present Nurse Communication: Mobility status      Time: 1320-1340 PT Time Calculation (min) (ACUTE ONLY): 20 min   Charges:   PT Evaluation $PT Eval Low Complexity: Dorris, PT, DPT Acute Rehabilitation Pager: [phone]   Zenaida Niece 06/02/2020, 2:09 PM

## 2020-06-02 NOTE — Consult Note (Signed)
Neurology Consultation  Reason for Consult: TIA Referring Physician: Dr. Sabino Gasser  CC: Speech problems, confusion  History is obtained from: Patient, chart, patient's wife at bedside  HPI: Patrick Macias is a 79 y.o. male past medical history of hypertension, hyperlipidemia, presenting to the emergency room yesterday for evaluation of complaints of confusion and speech problems along with dizziness. The family reports that he was in his usual state of health till about Thursday when he started complaining of some headache.  He is also on who gets headache usually.  The headache continued into Friday and it was Friday afternoon or Friday early morning that the family noted that he was acting confused.  He complained of being dizzy and off-balance.  They report him coming out of his room going towards the den wearing 2 different shoes without realizing.  When they asked him what is going on, he appeared confused.  His words still made sense but his answers to the questions were completely not pertinent. He appeared to be not able to comprehend the situation around him.  The daughter was called, she came in and found that his speech was slurred and that he was not acting like himself.  Due to the concern for an acute neurological event, he was taken to the emergency room at [facility], where his exam was nonfocal but due to the concern for speech related symptoms, he was transferred to [facility] for an MRI.  A neurological consultation was obtained for the concern for TIA.  Other pertinent history is that he has been diagnosed with MCI, not on Aricept or Namenda.  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  He is also been having trouble with his memory off and on and with his confusion that would happen once in a while.  Yesterday the confusion was persistent along with the speech changes and that was the reason for coming to the hospital.  The wife reports that for the last 2 weeks he is been having more of the above problems of being disorganized at work etc.  His daughter-in-law works with him at work and manages most of the work and communications with clients.  LKW: Sometime on Thursday May 31, 2020 tpa given?: no, outside the window Premorbid modified Rankin scale (mRS): 0  ROS: Performed and negative except as noted in HPI  Past Medical History:  Diagnosis Date  . Anxiety   . BPH (benign prostatic hyperplasia)   . Cerumen impaction   . Dyslipidemia   . H/O lithotripsy   . Hearing decreased, bilateral   . Hx of colonic polyps   . Hypertension   . IFG (impaired fasting glucose)   . Macrocytosis   . Nephrolithiasis   . Vitamin D deficiency     Family History  Problem Relation Age of Onset  . Sudden death Neg Hx   . Hypertension Neg Hx   . Hyperlipidemia Neg Hx   . Diabetes Neg Hx   . Heart attack Neg Hx     Social History:   reports that he has  never smoked. He has never used smokeless tobacco. He reports previous alcohol use. He reports that he does not use drugs.  Medications  Current Facility-Administered Medications:  .  acetaminophen (TYLENOL) tablet 650 mg, 650 mg, Oral, Q4H PRN **OR** acetaminophen (TYLENOL) 160 MG/5ML solution 650 mg, 650 mg, Per Tube, Q4H PRN **OR** acetaminophen (TYLENOL) suppository 650 mg, 650 mg, Rectal, Q4H PRN, Marcelyn Bruins, MD .  aspirin EC tablet 81 mg, 81 mg, Oral, Daily, Marcelyn Bruins, MD, 81 mg at 06/02/20 6295 .  [START ON 06/04/2020] cholecalciferol (VITAMIN D3) tablet 1,000 Units, 1,000 Units, Oral, Once per day on Mon Wed  Fri, Zhang, Ping T, MD .  enoxaparin (LOVENOX) injection 30 mg, 30 mg, Subcutaneous, Q12H, Marcelyn Bruins, MD, 30 mg at 06/02/20 0953 .  escitalopram (LEXAPRO) tablet 20 mg, 20 mg, Oral, Daily, Marcelyn Bruins, MD, 20 mg at 06/02/20 [phone] .  galantamine (RAZADYNE ER) 24 hr capsule 16 mg, 16 mg, Oral, Q breakfast, Marcelyn Bruins, MD, 16 mg at 06/02/20 3244 .  LORazepam (ATIVAN) tablet 0.5 mg, 0.5 mg, Oral, Q6H PRN, Marcelyn Bruins, MD .  rosuvastatin (CRESTOR) tablet 20 mg, 20 mg, Oral, QHS, Marcelyn Bruins, MD, 20 mg at 06/01/20 2225 .  senna-docusate (Senokot-S) tablet 1 tablet, 1 tablet, Oral, QHS PRN, Marcelyn Bruins, MD  Exam: Current vital signs: BP 115/64 (BP Location: Right Arm)   Pulse (!) 54   Temp 98 F (36.7 C) (Oral)   Resp 18   Ht _0  (1.803 m)   Wt 77.1 kg   SpO2 98%   BMI 23.71 kg/m  Vital signs in last 24 hours: Temp:  [98 F (36.7 C)-98.7 F (37.1 C)] 98 F (36.7 C) (12/04 1202) Pulse Rate:  [48-61] 54 (12/04 1202) Resp:  [14-19] 18 (12/04 1202) BP: (115-155)/(49-115) 115/64 (12/04 1202) SpO2:  [96 %-100 %] 98 % (12/04 1202) General: Awake alert in no distress HEENT: Was very grammatic Lungs: Clear Cardiovascular: Regular rhythm Abdomen soft nondistended nontender Extremities warm well perfused intact pulses Neurological exam Awake alert oriented x3 No dysarthria No aphasia Slightly reduced attention concentration Was able to name the last 2 presidents correctly but the president prior to President Trump, it took him a while to answer.  He could not tell me who is the president before [employer].  He was able to answer the question without any delay-when I asked him who is the president who was from [place] and was shot in [place]. Cranial nerves II to XII intact Motor exam: 5/5 in all fours without drift Sensation intact light touch without extinction Coordination: No dysmetria Gait testing-he was walking with  PT when I saw him and his gait was normal. DTRs: Slightly brisk knee jerks bilaterally NIH stroke scale 0  Labs I have reviewed labs in epic and the results pertinent to this consultation are:  CBC    Component Value Date/Time   WBC 6.8 06/01/2020 0943   RBC 3.83 (L) 06/01/2020 0943   HGB 13.4 06/01/2020 0943   HCT 37.9 (L) 06/01/2020 0943   PLT 211 06/01/2020 0943   MCV 99.0 06/01/2020 0943   MCH 35.0 (H) 06/01/2020 0943   MCHC 35.4 06/01/2020 0943   RDW 12.0 06/01/2020 0943   LYMPHSABS 1.3 06/01/2020 0943   MONOABS 0.6 06/01/2020 0943   EOSABS 0.0 06/01/2020 0943   BASOSABS 0.0 06/01/2020 0943    CMP     Component Value Date/Time   NA  133 (L) 06/01/2020 0943   K 3.8 06/01/2020 0943   CL 101 06/01/2020 0943   CO2 25 06/01/2020 0943   GLUCOSE 136 (H) 06/01/2020 0943   BUN 19 06/01/2020 0943   CREATININE 0.97 06/01/2020 0943   CALCIUM 8.8 (L) 06/01/2020 0943   PROT 7.3 06/01/2020 0943   ALBUMIN 4.2 06/01/2020 0943   AST 30 06/01/2020 0943   ALT 23 06/01/2020 0943   ALKPHOS 34 (L) 06/01/2020 0943   BILITOT 1.3 (H) 06/01/2020 0943   GFRNONAA >60 06/01/2020 0943   GFRAA  03/21/2008 0430    >60        The eGFR has been calculated using the MDRD equation. This calculation has not been validated in all clinical    Lipid Panel     Component Value Date/Time   CHOL 86 06/02/2020 0145   TRIG 91 06/02/2020 0145   HDL 36 (L) 06/02/2020 0145   CHOLHDL 2.4 06/02/2020 0145   VLDL 18 06/02/2020 0145   LDLCALC 32 06/02/2020 0145    2D echo pending A1c-5.1 LDL 32  Imaging I have reviewed the images obtained: CT-scan of the brain-no acute changes CTA head and neck-no large vessel occlusion, 20 to 30% luminal narrowing of the proximal right ICA and left carotid bulb.  Mild left P2 narrowing.  1.3 cm enhancing dorsal to left thyroid nodule-may reflect a parathyroid adenoma versus exophytic thyroid nodule.  Consider outpatient thyroid ultrasound.  MRI examination of  the brain-extensive chronic white matter disease.  No acute stroke.  Assessment: 78 year old man with above past medical history presenting for evaluation of extended.  Of confusion and slurred speech along with dizziness. Difficult to localize his exam to 1 cerebral hemisphere. MRI with no acute stroke but extensive chronic white matter disease. CTA head and neck with no significant stenosis in any of the vessels. Recently diagnosed with MCI by his primary care provider. Has had a history of difficulty with executive function, memory and certain other domains over the past few months. Speech difficulty-garbled speech as well as word finding difficulty and gait difficulty-could reflect posterior circulation TIA versus a left hemispheric TIA. He has already received risk factor work-up. Needs outpatient memory disorders consultation  Impression: Question TIA Memory disorder-MCI  Recommendations: Most of the stroke/TIA risk factor work-up was performed-echo was done but report is pending. He is currently on an aspirin-I would recommend aspirin 81 and Plavix 75 for 3 weeks followed by aspirin assuming that this is a TIA. His LDL is within goal less than 70-no need for a statin. No need for permissive hypertension-outpatient blood pressure goal should be normotension. Outpatient stroke neurology clinic at [facility] follow-up in 4 to 6 weeks.  He needs outpatient memory disorders evaluation to further quantify what domains of his cognition are most affected so that he could get the most effective treatment in terms of coping strategies. I would recommend consideration of addition of Aricept as an outpatient  In the absence of an echocardiogram showing some gross abnormalities, no further inpatient neurological recommendations at this time..  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  I told them to at least give him a week before he returns to  doing any of those since he had episodic symptoms over the last few days and I would want him to be symptom-free for a few days before he resumes his activities.  There is no concern for seizures based on the history that  they have provided me.  An outpatient EEG can be considered if stereotypical episodes happen.  Discussed my plan with the wife at bedside, patient and Dr. Sabino Gasser over the phone.    Please call inpatient neurology if we can be of any further assistance.  -- Amie Portland, MD [facility] Pager: [phone] If 7pm to 7am, please call on call as listed on AMION.

## 2020-06-02 NOTE — Progress Notes (Signed)
  Echocardiogram 2D Echocardiogram has been performed.  Patrick Macias 06/02/2020, 10:54 AM

## 2020-06-04 DIAGNOSIS — R4189 Other symptoms and signs involving cognitive functions and awareness: Secondary | ICD-10-CM

## 2020-06-04 NOTE — Discharge Summary (Addendum)
Physician Discharge Summary   Patrick Macias WSF:[id number] DOB: [birth year] DOA: 06/01/2020  PCP: Crist Infante, MD  Admit date: 06/01/2020 Discharge date: 06/04/2020  Admitted From: home Disposition:  home Admitting physician: Dr. Trilby Drummer Discharging physician: Dwyane Dee, MD  Recommendations for Outpatient Follow-up:  1. Follow up with neurology 2. Incidental left thyroid lobe nodule seen on CTA. Measures 1.3cm. Further workup outpatient   Patient discharged to home in Discharge Condition: stable CODE STATUS: Full Diet recommendation:  Diet Orders (From admission, onward)    Start     Ordered   06/02/20 0000  Diet general        06/02/20 [place] Hospital Course: Patrick Macias is a 79 y.o. male with medical history significant of dementia, hypertension, hyperlipidemia, anxiety who presented after his family noticed he had increased confusion and speech difficulties.  Patient was last seen normal at 9 PM the night prior.  As above the confusion and speech changes were noted were different from his baseline dementia per family.  Family reports they noticed he was unsteady on his feet initially, he then later put on 2 different shoes, he was very fidgety compared to his baseline and was also having difficulty finding the right words. Patient can recall some double vision but his memory of the events is otherwise nonspecific.  He denies fever, cough, chest pain, shortness of breath, abdominal pain, nausea, constipation, diarrhea.  He does report a headache yesterday.  Respiratory panel for flu and Covid negative.  UA without signs of infection, UDS negative, ESR within normal limits.  Chest x-ray within normal limits.  CT head showed no acute abnormality.  CTA head and neck showed narrowing of proximal right ICA, left carotid bulb, P2 segment; also noted incidental thyroid changes which may need follow-up.  On call neuro hospitalist consulted by EDP who recommended MRI and  admission.  MRI brain was negative for acute stroke.  Did show moderate to advanced chronic white matter changes consistent with chronic microvascular ischemia. CTA head/neck was negative for large LVO or significant stenosis.  Mild narrowing of the left P2 segment. Incidental finding of left thyroid lobe nodule, 1.3 cm.   His mentation improved during admission and he approached his normal baseline.  There was concern for this being possible TIA versus related to his underlying cognitive impairment.  Neurology was also consulted.  He was recommended to have outpatient evaluation for his cognitive impairment as it may be more advanced than thought previously.  In the meantime he was continued on aspirin and Plavix for 3 weeks followed by aspirin monotherapy in case this was TIA.   The patient's chronic medical conditions were treated accordingly per the patient's home medication regimen except as noted.  On day of discharge, patient was felt deemed stable for discharge. Patient/family member advised to call PCP or come back to ER if needed.  Principal Diagnosis: TIA (transient ischemic attack)  Discharge Diagnoses: Active Hospital Problems   Diagnosis Date Noted  . TIA (transient ischemic attack) 06/01/2020    Priority: High  . Cognitive impairment 06/04/2020    Priority: High  . Dementia without behavioral disturbance ([facility]) 06/01/2020  . Anxiety 06/01/2020  . HTN (hypertension) 06/01/2020    Resolved Hospital Problems  No resolved problems to display.    Discharge Instructions    Diet general   Complete by: As directed    Increase activity slowly   Complete by: As directed  Allergies as of 06/02/2020   No Known Allergies     Medication List    TAKE these medications   aspirin EC 81 MG tablet Take 1 tablet (81 mg total) by mouth daily.   clopidogrel 75 MG tablet Commonly known as: PLAVIX Take 1 tablet (75 mg total) by mouth daily for 21 days.   escitalopram 20 MG  tablet Commonly known as: LEXAPRO Take 20 mg by mouth daily.   galantamine 16 MG 24 hr capsule Commonly known as: RAZADYNE ER Take 16 mg by mouth daily with breakfast.   lisinopril-hydrochlorothiazide 20-25 MG tablet Commonly known as: ZESTORETIC Take 1 tablet by mouth daily.   rosuvastatin 20 MG tablet Commonly known as: CRESTOR Take 20 mg by mouth at bedtime.   Vitamin D-3 125 MCG (5000 UT) Tabs Take 1 tablet by mouth 3 (three) times a week.       Follow-up Information    Crist Infante, MD. Schedule an appointment as soon as possible for a visit in 1 week(s).   Specialty: Internal Medicine Why: Will need referral to outpatient neurology for further evaluation of cognitive impairment  Contact information: [place] [address] [phone]              No Known Allergies  Consultations: Neurology   Discharge Exam: BP 117/67 (BP Location: Left Arm)   Pulse (!) 54   Temp 97.9 F (36.6 C) (Oral)   Resp 18   Ht '5\' 11"'  (1.803 m)   Wt 77.1 kg   SpO2 99%   BMI 23.71 kg/m  General appearance: alert and cooperative Head: Normocephalic, without obvious abnormality, atraumatic Eyes: EOMI Lungs: clear to auscultation bilaterally Heart: regular rate and rhythm and S1, S2 normal Abdomen: normal findings: bowel sounds normal and soft, non-tender Extremities: no edema Skin: mobility and turgor normal Neurologic: strength intact and sensation intact bilaterally  The results of significant diagnostics from this hospitalization (including imaging, microbiology, ancillary and laboratory) are listed below for reference.   Microbiology: Recent Results (from the past 240 hour(s))  Resp Panel by RT-PCR (Flu A&B, Covid) Nasopharyngeal Swab     Status: None   Collection Time: 06/01/20  4:27 PM   Specimen: Nasopharyngeal Swab; Nasopharyngeal(NP) swabs in vial transport medium  Result Value Ref Range Status   SARS Coronavirus 2 by RT PCR NEGATIVE NEGATIVE  Final    Comment: (NOTE) SARS-CoV-2 target nucleic acids are NOT DETECTED.  The SARS-CoV-2 RNA is generally detectable in upper respiratory specimens during the acute phase of infection. The lowest concentration of SARS-CoV-2 viral copies this assay can detect is 138 copies/mL. A negative result does not preclude SARS-Cov-2 infection and should not be used as the sole basis for treatment or other patient management decisions. A negative result may occur with  improper specimen collection/handling, submission of specimen other than nasopharyngeal swab, presence of viral mutation(s) within the areas targeted by this assay, and inadequate number of viral copies(<138 copies/mL). A negative result must be combined with clinical observations, patient history, and epidemiological information. The expected result is Negative.  Fact Sheet for Patients:  EntrepreneurPulse.com.au  Fact Sheet for Healthcare Providers:  IncredibleEmployment.be  This test is no t yet approved or cleared by the [place] FDA and  has been authorized for detection and/or diagnosis of SARS-CoV-2 by FDA under an Emergency Use Authorization (EUA). This EUA will remain  in effect (meaning this test can be used) for the duration of the COVID-19 declaration under Section 564(b)(1) of  the Act, 21 U.S.C.section 360bbb-3(b)(1), unless the authorization is terminated  or revoked sooner.       Influenza A by PCR NEGATIVE NEGATIVE Final   Influenza B by PCR NEGATIVE NEGATIVE Final    Comment: (NOTE) The Xpert Xpress SARS-CoV-2/FLU/RSV plus assay is intended as an aid in the diagnosis of influenza from Nasopharyngeal swab specimens and should not be used as a sole basis for treatment. Nasal washings and aspirates are unacceptable for Xpert Xpress SARS-CoV-2/FLU/RSV testing.  Fact Sheet for Patients: EntrepreneurPulse.com.au  Fact Sheet for Healthcare  Providers: IncredibleEmployment.be  This test is not yet approved or cleared by the [place] FDA and has been authorized for detection and/or diagnosis of SARS-CoV-2 by FDA under an Emergency Use Authorization (EUA). This EUA will remain in effect (meaning this test can be used) for the duration of the COVID-19 declaration under Section 564(b)(1) of the Act, 21 U.S.C. section 360bbb-3(b)(1), unless the authorization is terminated or revoked.  Performed at [facility], [address] [place], [place] [address]      Labs: BNP (last 3 results) No results for input(s): BNP in the last 8760 hours. Basic Metabolic Panel: Recent Labs  Lab 06/01/20 0943  NA 133*  K 3.8  CL 101  CO2 25  GLUCOSE 136*  BUN 19  CREATININE 0.97  CALCIUM 8.8*   Liver Function Tests: Recent Labs  Lab 06/01/20 0943  AST 30  ALT 23  ALKPHOS 34*  BILITOT 1.3*  PROT 7.3  ALBUMIN 4.2   No results for input(s): LIPASE, AMYLASE in the last 168 hours. No results for input(s): AMMONIA in the last 168 hours. CBC: Recent Labs  Lab 06/01/20 0943  WBC 6.8  NEUTROABS 4.9  HGB 13.4  HCT 37.9*  MCV 99.0  PLT 211   Cardiac Enzymes: No results for input(s): CKTOTAL, CKMB, CKMBINDEX, TROPONINI in the last 168 hours. BNP: Invalid input(s): POCBNP CBG: Recent Labs  Lab 06/01/20 0935  GLUCAP 122*   D-Dimer No results for input(s): DDIMER in the last 72 hours. Hgb A1c Recent Labs    06/02/20 0145  HGBA1C 5.1   Lipid Profile Recent Labs    06/02/20 0145  CHOL 86  HDL 36*  LDLCALC 32  TRIG 91  CHOLHDL 2.4   Thyroid function studies No results for input(s): TSH, T4TOTAL, T3FREE, THYROIDAB in the last 72 hours.  Invalid input(s): FREET3 Anemia work up No results for input(s): VITAMINB12, FOLATE, FERRITIN, TIBC, IRON, RETICCTPCT in the last 72 hours. Urinalysis    Component Value Date/Time   COLORURINE YELLOW 06/01/2020 1200   APPEARANCEUR CLEAR  06/01/2020 1200   LABSPEC 1.020 06/01/2020 1200   PHURINE 6.5 06/01/2020 1200   GLUCOSEU NEGATIVE 06/01/2020 1200   HGBUR TRACE (A) 06/01/2020 1200   BILIRUBINUR NEGATIVE 06/01/2020 1200   KETONESUR NEGATIVE 06/01/2020 1200   PROTEINUR NEGATIVE 06/01/2020 1200   NITRITE NEGATIVE 06/01/2020 1200   LEUKOCYTESUR NEGATIVE 06/01/2020 1200   Sepsis Labs Invalid input(s): PROCALCITONIN,  WBC,  LACTICIDVEN Microbiology Recent Results (from the past 240 hour(s))  Resp Panel by RT-PCR (Flu A&B, Covid) Nasopharyngeal Swab     Status: None   Collection Time: 06/01/20  4:27 PM   Specimen: Nasopharyngeal Swab; Nasopharyngeal(NP) swabs in vial transport medium  Result Value Ref Range Status   SARS Coronavirus 2 by RT PCR NEGATIVE NEGATIVE Final    Comment: (NOTE) SARS-CoV-2 target nucleic acids are NOT DETECTED.  The SARS-CoV-2 RNA is generally detectable in upper respiratory  specimens during the acute phase of infection. The lowest concentration of SARS-CoV-2 viral copies this assay can detect is 138 copies/mL. A negative result does not preclude SARS-Cov-2 infection and should not be used as the sole basis for treatment or other patient management decisions. A negative result may occur with  improper specimen collection/handling, submission of specimen other than nasopharyngeal swab, presence of viral mutation(s) within the areas targeted by this assay, and inadequate number of viral copies(<138 copies/mL). A negative result must be combined with clinical observations, patient history, and epidemiological information. The expected result is Negative.  Fact Sheet for Patients:  EntrepreneurPulse.com.au  Fact Sheet for Healthcare Providers:  IncredibleEmployment.be  This test is no t yet approved or cleared by the [place] FDA and  has been authorized for detection and/or diagnosis of SARS-CoV-2 by FDA under an Emergency Use Authorization (EUA).  This EUA will remain  in effect (meaning this test can be used) for the duration of the COVID-19 declaration under Section 564(b)(1) of the Act, 21 U.S.C.section 360bbb-3(b)(1), unless the authorization is terminated  or revoked sooner.       Influenza A by PCR NEGATIVE NEGATIVE Final   Influenza B by PCR NEGATIVE NEGATIVE Final    Comment: (NOTE) The Xpert Xpress SARS-CoV-2/FLU/RSV plus assay is intended as an aid in the diagnosis of influenza from Nasopharyngeal swab specimens and should not be used as a sole basis for treatment. Nasal washings and aspirates are unacceptable for Xpert Xpress SARS-CoV-2/FLU/RSV testing.  Fact Sheet for Patients: EntrepreneurPulse.com.au  Fact Sheet for Healthcare Providers: IncredibleEmployment.be  This test is not yet approved or cleared by the [place] FDA and has been authorized for detection and/or diagnosis of SARS-CoV-2 by FDA under an Emergency Use Authorization (EUA). This EUA will remain in effect (meaning this test can be used) for the duration of the COVID-19 declaration under Section 564(b)(1) of the Act, 21 U.S.C. section 360bbb-3(b)(1), unless the authorization is terminated or revoked.  Performed at [facility], [address] [place], [place] [address]     Procedures/Studies: CT Angio Head W or Wo Contrast  Result Date: 06/01/2020 CLINICAL DATA:  Neuro deficit, acute, stroke suspected EXAM: CT ANGIOGRAPHY HEAD AND NECK TECHNIQUE: Multidetector CT imaging of the head and neck was performed using the standard protocol during bolus administration of intravenous contrast. Multiplanar CT image reconstructions and MIPs were obtained to evaluate the vascular anatomy. Carotid stenosis measurements (when applicable) are obtained utilizing NASCET criteria, using the distal internal carotid diameter as the denominator. CONTRAST:  143m OMNIPAQUE IOHEXOL 350 MG/ML SOLN COMPARISON:   06/01/2020 head CT.  03/20/2020 MRI head. FINDINGS: CTA NECK FINDINGS Aortic arch: Standard branching. Imaged portion shows no evidence of aneurysm or dissection. No significant stenosis of the major arch vessel origins. Minimal aortic arch atherosclerotic calcifications. Right carotid system: No evidence of dissection, stenosis (50% or greater) or occlusion. Proximal to mid ICA calcified atheromatous plaque with 30% luminal narrowing. Left carotid system: No evidence of dissection, stenosis (50% or greater) or occlusion. Carotid bulb atherosclerotic calcifications with 20% luminal narrowing. Vertebral arteries: Dominant left vertebral artery. No evidence of dissection, stenosis (50% or greater) or occlusion. Skeleton: No acute finding.  Multilevel spondylosis. Other neck: No adenopathy. 1.3 cm enhancing nodule dorsal to the left thyroid (515:[id number]) with an associated polar vessel may reflect a parathyroid adenoma versus exophytic thyroid nodule. Upper chest: Dependent atelectasis. Review of the MIP images confirms the above findings CTA HEAD FINDINGS Anterior circulation: Patent ICAs.  Bilateral carotid siphon atherosclerotic calcifications. Patent ACAs and MCAs. No significant stenosis, proximal occlusion, aneurysm, or vascular malformation. Posterior circulation: Patent V4 segments and PICA. Patent basilar artery. Patent superior cerebellar and posterior cerebral arteries. Fetal origin of the left PCA with mild narrowing of the P2 segment. No proximal occlusion, aneurysm, or vascular malformation. Venous sinuses: As permitted by contrast timing, patent. Anatomic variants: Please see above. Review of the MIP images confirms the above findings IMPRESSION: 1. No large vessel occlusion, hemodynamically significant stenosis, or evidence of dissection. 2. 20-30% luminal narrowing of the proximal right ICA and left carotid bulb 3. Mild narrowing of the left P2 segment. 4. 1.3 cm enhancing nodule dorsal to the left thyroid  lobe may reflect a parathyroid adenoma versus exophytic thyroid nodule. Consider outpatient thyroid ultrasound and correlation with endocrine labs for further evaluation. Electronically Signed   By: Primitivo Gauze M.D.   On: 06/01/2020 13:37   CT HEAD WO CONTRAST  Result Date: 06/01/2020 CLINICAL DATA:  Headache, confusion. EXAM: CT HEAD WITHOUT CONTRAST TECHNIQUE: Contiguous axial images were obtained from the base of the skull through the vertex without intravenous contrast. COMPARISON:  March 20, 2020. FINDINGS: Brain: Mild chronic ischemic white matter disease is noted. No mass effect or midline shift is noted. Ventricular size is within normal limits. There is no evidence of mass lesion, hemorrhage or acute infarction. Vascular: No hyperdense vessel or unexpected calcification. Skull: Normal. Negative for fracture or focal lesion. Sinuses/Orbits: No acute finding. Other: None. IMPRESSION: Mild chronic ischemic white matter disease. No acute intracranial abnormality seen. Electronically Signed   By: Marijo Conception M.D.   On: 06/01/2020 10:23   CT Angio Neck W and/or Wo Contrast  Result Date: 06/01/2020 CLINICAL DATA:  Neuro deficit, acute, stroke suspected EXAM: CT ANGIOGRAPHY HEAD AND NECK TECHNIQUE: Multidetector CT imaging of the head and neck was performed using the standard protocol during bolus administration of intravenous contrast. Multiplanar CT image reconstructions and MIPs were obtained to evaluate the vascular anatomy. Carotid stenosis measurements (when applicable) are obtained utilizing NASCET criteria, using the distal internal carotid diameter as the denominator. CONTRAST:  177m OMNIPAQUE IOHEXOL 350 MG/ML SOLN COMPARISON:  06/01/2020 head CT.  03/20/2020 MRI head. FINDINGS: CTA NECK FINDINGS Aortic arch: Standard branching. Imaged portion shows no evidence of aneurysm or dissection. No significant stenosis of the major arch vessel origins. Minimal aortic arch atherosclerotic  calcifications. Right carotid system: No evidence of dissection, stenosis (50% or greater) or occlusion. Proximal to mid ICA calcified atheromatous plaque with 30% luminal narrowing. Left carotid system: No evidence of dissection, stenosis (50% or greater) or occlusion. Carotid bulb atherosclerotic calcifications with 20% luminal narrowing. Vertebral arteries: Dominant left vertebral artery. No evidence of dissection, stenosis (50% or greater) or occlusion. Skeleton: No acute finding.  Multilevel spondylosis. Other neck: No adenopathy. 1.3 cm enhancing nodule dorsal to the left thyroid (515:154) with an associated polar vessel may reflect a parathyroid adenoma versus exophytic thyroid nodule. Upper chest: Dependent atelectasis. Review of the MIP images confirms the above findings CTA HEAD FINDINGS Anterior circulation: Patent ICAs. Bilateral carotid siphon atherosclerotic calcifications. Patent ACAs and MCAs. No significant stenosis, proximal occlusion, aneurysm, or vascular malformation. Posterior circulation: Patent V4 segments and PICA. Patent basilar artery. Patent superior cerebellar and posterior cerebral arteries. Fetal origin of the left PCA with mild narrowing of the P2 segment. No proximal occlusion, aneurysm, or vascular malformation. Venous sinuses: As permitted by contrast timing, patent. Anatomic variants: Please see above. Review of the  MIP images confirms the above findings IMPRESSION: 1. No large vessel occlusion, hemodynamically significant stenosis, or evidence of dissection. 2. 20-30% luminal narrowing of the proximal right ICA and left carotid bulb 3. Mild narrowing of the left P2 segment. 4. 1.3 cm enhancing nodule dorsal to the left thyroid lobe may reflect a parathyroid adenoma versus exophytic thyroid nodule. Consider outpatient thyroid ultrasound and correlation with endocrine labs for further evaluation. Electronically Signed   By: Primitivo Gauze M.D.   On: 06/01/2020 13:37   MR  BRAIN WO CONTRAST  Result Date: 06/02/2020 CLINICAL DATA:  Stroke follow-up. Dementia, hypertension, hyperlipidemia. Confusion and speech difficulty today. EXAM: MRI HEAD WITHOUT CONTRAST TECHNIQUE: Multiplanar, multiecho pulse sequences of the brain and surrounding structures were obtained without intravenous contrast. COMPARISON:  MRI head 03/20/2020 FINDINGS: Brain: Negative for acute infarct. Moderate to advanced chronic white matter changes. Patchy periventricular deep white matter hyperintensity bilaterally. Small hyperintensity right paramedian pons. Negative for hemorrhage or mass. Cerebral volume normal for age. Negative for hydrocephalus. Vascular: Normal arterial flow voids Skull and upper cervical spine: No focal skeletal abnormality. Sinuses/Orbits: Mild mucosal edema paranasal sinuses. Bilateral cataract extraction. Other: None IMPRESSION: Negative for acute infarct. Moderate to advanced chronic white matter changes most consistent with chronic microvascular ischemia. Electronically Signed   By: Franchot Gallo M.D.   On: 06/02/2020 06:17   DG Chest Port 1 View  Result Date: 06/01/2020 CLINICAL DATA:  Altered mental status. EXAM: PORTABLE CHEST 1 VIEW COMPARISON:  March 16, 2008. FINDINGS: The heart size and mediastinal contours are within normal limits. Both lungs are clear. The visualized skeletal structures are unremarkable. IMPRESSION: No active disease. Electronically Signed   By: Marijo Conception M.D.   On: 06/01/2020 10:20   ECHOCARDIOGRAM COMPLETE  Result Date: 06/02/2020    ECHOCARDIOGRAM REPORT   Patient Name:   MYER BOHLMAN Dano Date of Exam: 06/02/2020 Medical Rec #:  [id number]       Height:       71.0 in Accession #:    [id number]      Weight:       170.0 lb Date of Birth:  [birth year]       BSA:          1.968 m Patient Age:    25 years        BP:           121/64 mmHg Patient Gender: M               HR:           56 bpm. Exam Location:  Inpatient Procedure: 2D Echo, Cardiac  Doppler and Color Doppler Indications:    TIA 435.9 / G45.9  History:        Patient has no prior history of Echocardiogram examinations.                 Risk Factors:Hypertension, Dyslipidemia and Non-Smoker.  Sonographer:    Vickie Epley RDCS Referring Phys: [id number] [place]  1. Left ventricular ejection fraction, by estimation, is 60 to 65%. The left ventricle has normal function. The left ventricle has no regional wall motion abnormalities. Left ventricular diastolic parameters were normal.  2. Right ventricular systolic function is normal. The right ventricular size is moderately enlarged. There is normal pulmonary artery systolic pressure. The estimated right ventricular systolic pressure is 14.4 mmHg.  3. Right atrial size was mildly dilated.  4. The mitral valve is normal in structure. No  evidence of mitral valve regurgitation.  5. The aortic valve is tricuspid. Aortic valve regurgitation is trivial. No aortic stenosis is present.  6. The inferior vena cava is dilated in size with >50% respiratory variability, suggesting right atrial pressure of 8 mmHg. FINDINGS  Left Ventricle: Left ventricular ejection fraction, by estimation, is 60 to 65%. The left ventricle has normal function. The left ventricle has no regional wall motion abnormalities. The left ventricular internal cavity size was normal in size. There is  no left ventricular hypertrophy. Left ventricular diastolic parameters were normal. Right Ventricle: The right ventricular size is moderately enlarged. Right vetricular wall thickness was not assessed. Right ventricular systolic function is normal. There is normal pulmonary artery systolic pressure. The tricuspid regurgitant velocity is  2.12 m/s, and with an assumed right atrial pressure of 8 mmHg, the estimated right ventricular systolic pressure is 53.6 mmHg. Left Atrium: Left atrial size was normal in size. Right Atrium: Right atrial size was mildly dilated. Pericardium:  There is no evidence of pericardial effusion. Mitral Valve: The mitral valve is normal in structure. No evidence of mitral valve regurgitation. Tricuspid Valve: The tricuspid valve is not well visualized. Tricuspid valve regurgitation is trivial. Aortic Valve: The aortic valve is tricuspid. Aortic valve regurgitation is trivial. No aortic stenosis is present. Pulmonic Valve: The pulmonic valve was not well visualized. Pulmonic valve regurgitation is not visualized. Aorta: The aortic root is normal in size and structure. Venous: The inferior vena cava is dilated in size with greater than 50% respiratory variability, suggesting right atrial pressure of 8 mmHg. IAS/Shunts: The interatrial septum was not well visualized.  LEFT VENTRICLE PLAX 2D LVIDd:         3.90 cm      Diastology LVIDs:         3.00 cm      LV e' medial:    5.76 cm/s LV PW:         0.90 cm      LV E/e' medial:  13.8 LV IVS:        0.90 cm      LV e' lateral:   9.41 cm/s LVOT diam:     2.40 cm      LV E/e' lateral: 8.5 LV SV:         105 LV SV Index:   53 LVOT Area:     4.52 cm  LV Volumes (MOD) LV vol d, MOD A2C: 113.0 ml LV vol d, MOD A4C: 130.0 ml LV vol s, MOD A2C: 41.5 ml LV vol s, MOD A4C: 43.5 ml LV SV MOD A2C:     71.5 ml LV SV MOD A4C:     130.0 ml LV SV MOD BP:      82.6 ml RIGHT VENTRICLE RV S prime:     14.10 cm/s TAPSE (M-mode): 2.7 cm LEFT ATRIUM             Index       RIGHT ATRIUM           Index LA diam:        4.10 cm 2.08 cm/m  RA Area:     22.05 cm LA Vol (A2C):   45.1 ml 22.92 ml/m RA Volume:   76.80 ml  39.03 ml/m LA Vol (A4C):   46.7 ml 23.73 ml/m LA Biplane Vol: 49.4 ml 25.10 ml/m  AORTIC VALVE LVOT Vmax:   97.90 cm/s LVOT Vmean:  62.100 cm/s LVOT VTI:    0.231 m  AORTA Ao Root diam: 3.80 cm MITRAL VALVE               TRICUSPID VALVE MV Area (PHT): 2.48 cm    TR Peak grad:   18.0 mmHg MV Decel Time: 306 msec    TR Vmax:        212.00 cm/s MV E velocity: 79.70 cm/s MV A velocity: 90.40 cm/s  SHUNTS MV E/A ratio:  0.88         Systemic VTI:  0.23 m                            Systemic Diam: 2.40 cm Oswaldo Milian MD Electronically signed by Oswaldo Milian MD Signature Date/Time: 06/02/2020/3:15:25 PM    Final      Time coordinating discharge: Over 30 minutes    Dwyane Dee, MD  [facility] 06/04/2020, 3:15 PM

## 2020-06-07 DIAGNOSIS — E041 Nontoxic single thyroid nodule: Secondary | ICD-10-CM | POA: Diagnosis not present

## 2020-06-07 DIAGNOSIS — E785 Hyperlipidemia, unspecified: Secondary | ICD-10-CM | POA: Diagnosis not present

## 2020-06-07 DIAGNOSIS — G459 Transient cerebral ischemic attack, unspecified: Secondary | ICD-10-CM | POA: Diagnosis not present

## 2020-06-07 DIAGNOSIS — I10 Essential (primary) hypertension: Secondary | ICD-10-CM | POA: Diagnosis not present

## 2020-06-07 DIAGNOSIS — R413 Other amnesia: Secondary | ICD-10-CM | POA: Diagnosis not present

## 2020-06-07 DIAGNOSIS — I6381 Other cerebral infarction due to occlusion or stenosis of small artery: Secondary | ICD-10-CM | POA: Diagnosis not present

## 2020-06-07 DIAGNOSIS — R69 Illness, unspecified: Secondary | ICD-10-CM | POA: Diagnosis not present

## 2020-06-07 DIAGNOSIS — I2510 Atherosclerotic heart disease of native coronary artery without angina pectoris: Secondary | ICD-10-CM | POA: Diagnosis not present

## 2020-06-13 ENCOUNTER — Other Ambulatory Visit: Payer: Self-pay | Admitting: Internal Medicine

## 2020-06-13 DIAGNOSIS — E041 Nontoxic single thyroid nodule: Secondary | ICD-10-CM

## 2020-06-30 ENCOUNTER — Emergency Department (HOSPITAL_BASED_OUTPATIENT_CLINIC_OR_DEPARTMENT_OTHER): Payer: Medicare HMO

## 2020-06-30 ENCOUNTER — Encounter (HOSPITAL_BASED_OUTPATIENT_CLINIC_OR_DEPARTMENT_OTHER): Payer: Self-pay

## 2020-06-30 ENCOUNTER — Observation Stay (HOSPITAL_COMMUNITY): Payer: Medicare HMO

## 2020-06-30 ENCOUNTER — Inpatient Hospital Stay (HOSPITAL_BASED_OUTPATIENT_CLINIC_OR_DEPARTMENT_OTHER)
Admission: EM | Admit: 2020-06-30 | Discharge: 2020-07-12 | DRG: 071 | Disposition: A | Discharge status: Home Health | Payer: Medicare HMO | Attending: Internal Medicine | Admitting: Internal Medicine

## 2020-06-30 DIAGNOSIS — R4182 Altered mental status, unspecified: Secondary | ICD-10-CM | POA: Diagnosis not present

## 2020-06-30 DIAGNOSIS — Z20822 Contact with and (suspected) exposure to covid-19: Secondary | ICD-10-CM | POA: Diagnosis present

## 2020-06-30 DIAGNOSIS — R6510 Systemic inflammatory response syndrome (SIRS) of non-infectious origin without acute organ dysfunction: Secondary | ICD-10-CM

## 2020-06-30 DIAGNOSIS — M25551 Pain in right hip: Secondary | ICD-10-CM

## 2020-06-30 DIAGNOSIS — Z781 Physical restraint status: Secondary | ICD-10-CM

## 2020-06-30 DIAGNOSIS — N401 Enlarged prostate with lower urinary tract symptoms: Secondary | ICD-10-CM | POA: Diagnosis present

## 2020-06-30 DIAGNOSIS — L509 Urticaria, unspecified: Secondary | ICD-10-CM

## 2020-06-30 DIAGNOSIS — G9341 Metabolic encephalopathy: Principal | ICD-10-CM

## 2020-06-30 DIAGNOSIS — R410 Disorientation, unspecified: Secondary | ICD-10-CM

## 2020-06-30 DIAGNOSIS — M79641 Pain in right hand: Secondary | ICD-10-CM

## 2020-06-30 DIAGNOSIS — F419 Anxiety disorder, unspecified: Secondary | ICD-10-CM | POA: Diagnosis present

## 2020-06-30 DIAGNOSIS — M79642 Pain in left hand: Secondary | ICD-10-CM | POA: Diagnosis not present

## 2020-06-30 DIAGNOSIS — M25512 Pain in left shoulder: Secondary | ICD-10-CM

## 2020-06-30 DIAGNOSIS — R338 Other retention of urine: Secondary | ICD-10-CM | POA: Diagnosis present

## 2020-06-30 DIAGNOSIS — M109 Gout, unspecified: Secondary | ICD-10-CM | POA: Diagnosis present

## 2020-06-30 DIAGNOSIS — R509 Fever, unspecified: Secondary | ICD-10-CM | POA: Diagnosis not present

## 2020-06-30 DIAGNOSIS — M19012 Primary osteoarthritis, left shoulder: Secondary | ICD-10-CM | POA: Diagnosis not present

## 2020-06-30 DIAGNOSIS — M25532 Pain in left wrist: Secondary | ICD-10-CM

## 2020-06-30 DIAGNOSIS — Z9841 Cataract extraction status, right eye: Secondary | ICD-10-CM

## 2020-06-30 DIAGNOSIS — F0150 Vascular dementia without behavioral disturbance: Secondary | ICD-10-CM | POA: Diagnosis present

## 2020-06-30 DIAGNOSIS — R531 Weakness: Secondary | ICD-10-CM | POA: Diagnosis not present

## 2020-06-30 DIAGNOSIS — M19011 Primary osteoarthritis, right shoulder: Secondary | ICD-10-CM | POA: Diagnosis not present

## 2020-06-30 DIAGNOSIS — M25552 Pain in left hip: Secondary | ICD-10-CM | POA: Diagnosis not present

## 2020-06-30 DIAGNOSIS — Z8673 Personal history of transient ischemic attack (TIA), and cerebral infarction without residual deficits: Secondary | ICD-10-CM

## 2020-06-30 DIAGNOSIS — R52 Pain, unspecified: Secondary | ICD-10-CM | POA: Diagnosis not present

## 2020-06-30 DIAGNOSIS — E559 Vitamin D deficiency, unspecified: Secondary | ICD-10-CM | POA: Diagnosis present

## 2020-06-30 DIAGNOSIS — M25511 Pain in right shoulder: Secondary | ICD-10-CM

## 2020-06-30 DIAGNOSIS — K5900 Constipation, unspecified: Secondary | ICD-10-CM | POA: Diagnosis not present

## 2020-06-30 DIAGNOSIS — Z7982 Long term (current) use of aspirin: Secondary | ICD-10-CM

## 2020-06-30 DIAGNOSIS — R251 Tremor, unspecified: Secondary | ICD-10-CM | POA: Diagnosis present

## 2020-06-30 DIAGNOSIS — Z9842 Cataract extraction status, left eye: Secondary | ICD-10-CM

## 2020-06-30 DIAGNOSIS — R062 Wheezing: Secondary | ICD-10-CM | POA: Diagnosis not present

## 2020-06-30 DIAGNOSIS — I10 Essential (primary) hypertension: Secondary | ICD-10-CM | POA: Diagnosis not present

## 2020-06-30 DIAGNOSIS — R4189 Other symptoms and signs involving cognitive functions and awareness: Secondary | ICD-10-CM

## 2020-06-30 DIAGNOSIS — M25561 Pain in right knee: Secondary | ICD-10-CM

## 2020-06-30 DIAGNOSIS — Z79899 Other long term (current) drug therapy: Secondary | ICD-10-CM

## 2020-06-30 DIAGNOSIS — M25531 Pain in right wrist: Secondary | ICD-10-CM

## 2020-06-30 DIAGNOSIS — R0989 Other specified symptoms and signs involving the circulatory and respiratory systems: Secondary | ICD-10-CM

## 2020-06-30 DIAGNOSIS — F05 Delirium due to known physiological condition: Secondary | ICD-10-CM | POA: Diagnosis present

## 2020-06-30 DIAGNOSIS — R9431 Abnormal electrocardiogram [ECG] [EKG]: Secondary | ICD-10-CM | POA: Diagnosis not present

## 2020-06-30 DIAGNOSIS — E876 Hypokalemia: Secondary | ICD-10-CM | POA: Diagnosis not present

## 2020-06-30 DIAGNOSIS — E785 Hyperlipidemia, unspecified: Secondary | ICD-10-CM | POA: Diagnosis present

## 2020-06-30 LAB — COMPREHENSIVE METABOLIC PANEL
ALT: 16 U/L (ref 0–44)
AST: 24 U/L (ref 15–41)
Albumin: 3.6 g/dL (ref 3.5–5.0)
Alkaline Phosphatase: 29 U/L — ABNORMAL LOW (ref 38–126)
Anion gap: 10 (ref 5–15)
BUN: 27 mg/dL — ABNORMAL HIGH (ref 8–23)
CO2: 22 mmol/L (ref 22–32)
Calcium: 8.6 mg/dL — ABNORMAL LOW (ref 8.9–10.3)
Chloride: 102 mmol/L (ref 98–111)
Creatinine, Ser: 1.13 mg/dL (ref 0.61–1.24)
GFR, Estimated: >60 mL/min (ref >60)
Glucose, Bld: 203 mg/dL — ABNORMAL HIGH (ref 70–99)
Potassium: 3.6 mmol/L (ref 3.5–5.1)
Sodium: 134 mmol/L — ABNORMAL LOW (ref 135–145)
Total Bilirubin: 1.5 mg/dL — ABNORMAL HIGH (ref 0.3–1.2)
Total Protein: 6.7 g/dL (ref 6.5–8.1)

## 2020-06-30 LAB — PROTIME-INR
INR: 1.2 (ref 0.8–1.2)
Prothrombin Time: 14.7 seconds (ref 11.4–15.2)

## 2020-06-30 LAB — URINALYSIS, ROUTINE W REFLEX MICROSCOPIC
Bilirubin Urine: NEGATIVE
Glucose, UA: NEGATIVE mg/dL
Ketones, ur: 15 mg/dL — AB
Leukocytes,Ua: NEGATIVE
Nitrite: NEGATIVE
Protein, ur: 30 mg/dL — AB
Specific Gravity, Urine: 1.02 (ref 1.005–1.030)
pH: 6 (ref 5.0–8.0)

## 2020-06-30 LAB — CBC WITH DIFFERENTIAL/PLATELET
Abs Immature Granulocytes: 0.04 10*3/uL (ref 0.00–0.07)
Basophils Absolute: 0 10*3/uL (ref 0.0–0.1)
Basophils Relative: 0 %
Eosinophils Absolute: 0 10*3/uL (ref 0.0–0.5)
Eosinophils Relative: 0 %
HCT: 31.7 % — ABNORMAL LOW (ref 39.0–52.0)
Hemoglobin: 10.9 g/dL — ABNORMAL LOW (ref 13.0–17.0)
Immature Granulocytes: 1 %
Lymphocytes Relative: 12 %
Lymphs Abs: 0.8 10*3/uL (ref 0.7–4.0)
MCH: 34.3 pg — ABNORMAL HIGH (ref 26.0–34.0)
MCHC: 34.4 g/dL (ref 30.0–36.0)
MCV: 99.7 fL (ref 80.0–100.0)
Monocytes Absolute: 0.7 10*3/uL (ref 0.1–1.0)
Monocytes Relative: 9 %
Neutro Abs: 5.6 10*3/uL (ref 1.7–7.7)
Neutrophils Relative %: 78 %
Platelets: 181 10*3/uL (ref 150–400)
RBC: 3.18 MIL/uL — ABNORMAL LOW (ref 4.22–5.81)
RDW: 12.6 % (ref 11.5–15.5)
WBC: 7.1 10*3/uL (ref 4.0–10.5)
nRBC: 0 % (ref 0.0–0.2)

## 2020-06-30 LAB — URINALYSIS, MICROSCOPIC (REFLEX)

## 2020-06-30 LAB — APTT: aPTT: 31 seconds (ref 24–36)

## 2020-06-30 LAB — CBG MONITORING, ED: Glucose-Capillary: 114 mg/dL — ABNORMAL HIGH (ref 70–99)

## 2020-06-30 LAB — C-REACTIVE PROTEIN: CRP: 12.6 mg/dL — ABNORMAL HIGH (ref <1.0)

## 2020-06-30 LAB — VITAMIN B12: Vitamin B-12: 221 pg/mL (ref 180–914)

## 2020-06-30 LAB — FOLATE: Folate: 10.1 ng/mL (ref >5.9)

## 2020-06-30 LAB — SEDIMENTATION RATE: Sed Rate: 35 mm/hr — ABNORMAL HIGH (ref 0–16)

## 2020-06-30 LAB — RESP PANEL BY RT-PCR (FLU A&B, COVID) ARPGX2
Influenza A by PCR: NEGATIVE
Influenza B by PCR: NEGATIVE
SARS Coronavirus 2 by RT PCR: NEGATIVE

## 2020-06-30 LAB — LACTIC ACID, PLASMA
Lactic Acid, Venous: 2.1 mmol/L (ref 0.5–1.9)
Lactic Acid, Venous: 1.2 mmol/L (ref 0.5–1.9)

## 2020-06-30 LAB — PHOSPHORUS: Phosphorus: 3 mg/dL (ref 2.5–4.6)

## 2020-06-30 LAB — TROPONIN I (HIGH SENSITIVITY)
Troponin I (High Sensitivity): 18 ng/L — ABNORMAL HIGH (ref <18)
Troponin I (High Sensitivity): 21 ng/L — ABNORMAL HIGH (ref <18)

## 2020-06-30 LAB — MAGNESIUM: Magnesium: 1.6 mg/dL — ABNORMAL LOW (ref 1.7–2.4)

## 2020-06-30 LAB — TSH: TSH: 0.873 u[IU]/mL (ref 0.350–4.500)

## 2020-06-30 LAB — CK: Total CK: 398 U/L — ABNORMAL HIGH (ref 49–397)

## 2020-06-30 LAB — AMMONIA: Ammonia: 28 umol/L (ref 9–35)

## 2020-06-30 IMAGING — DX DG HIP (WITH OR WITHOUT PELVIS) 1V PORT*R*
1 series · 1 of 1 positions shown · non-contrast
Comparison: None.

CLINICAL DATA: Acute hip pain

EXAM:
DG HIP (WITH OR WITHOUT PELVIS) 1V PORT RIGHT

[hip]
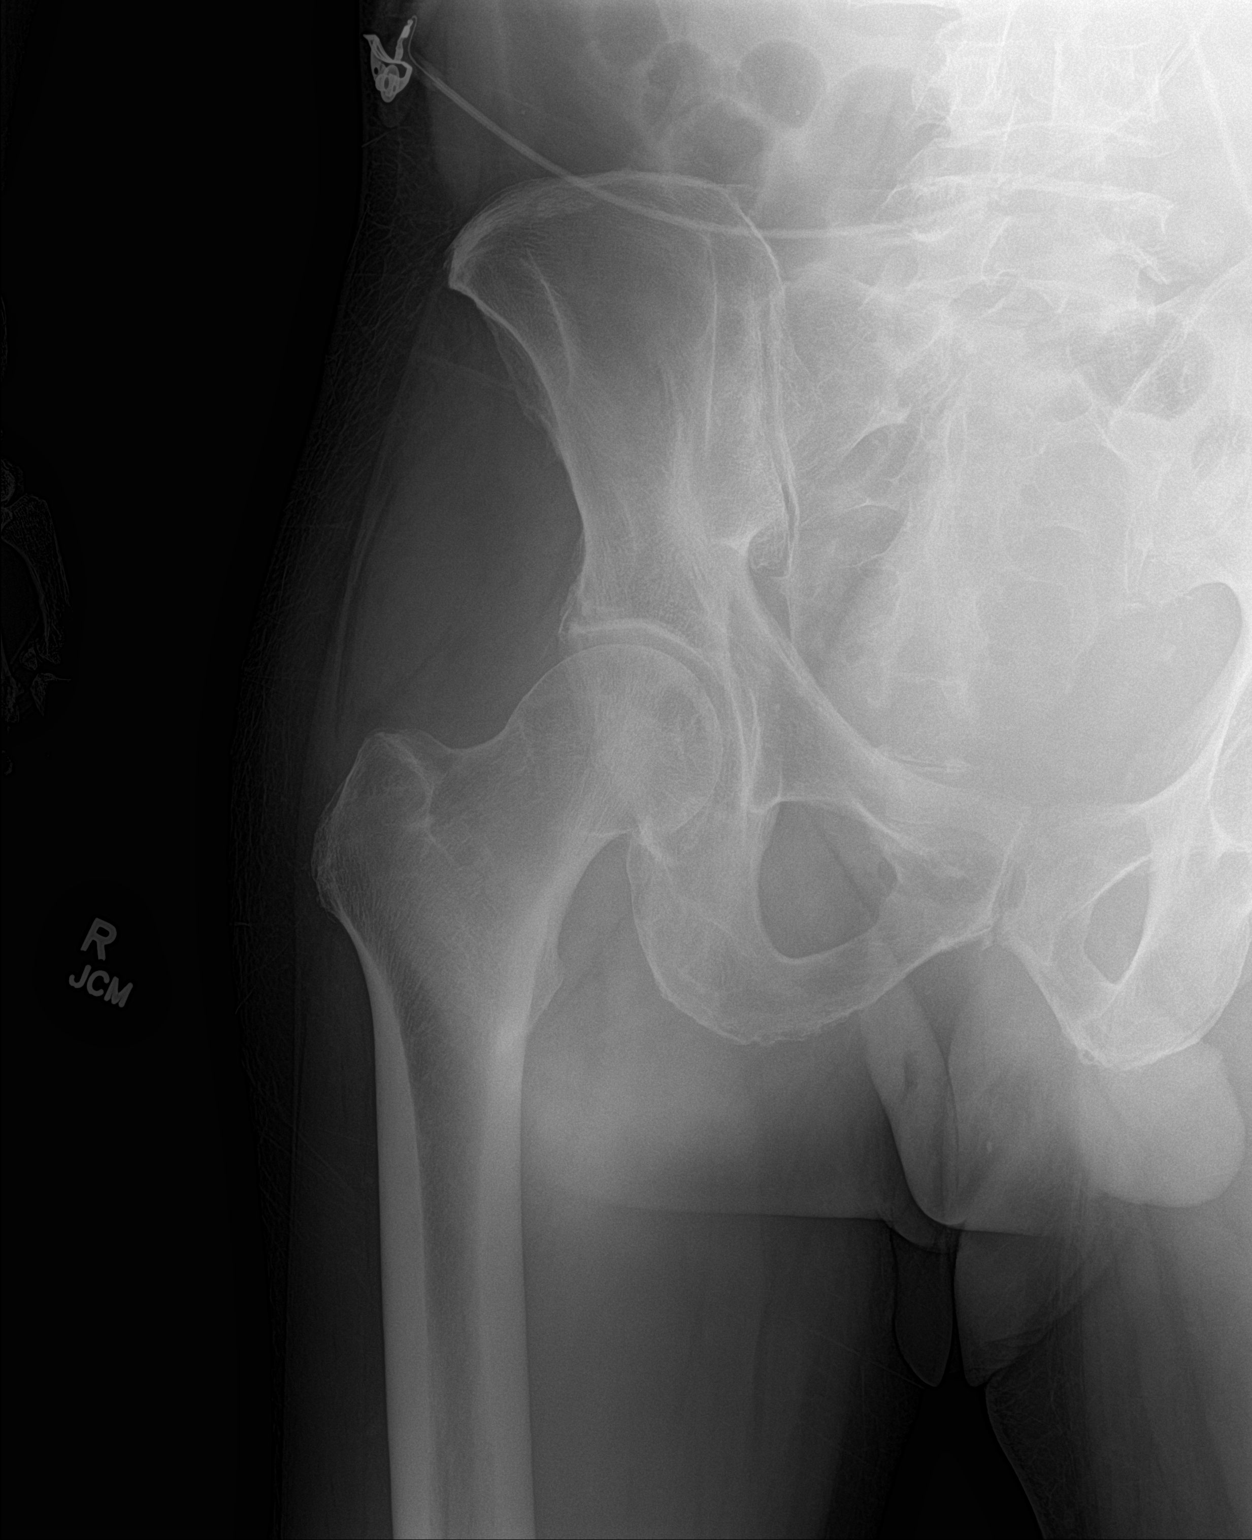

[1 of 1 positions shown; findings below may reference images not displayed]

FINDINGS: There is no evidence of hip fracture or dislocation. There is no
evidence of arthropathy or other focal bone abnormality.
IMPRESSION: Negative.

## 2020-06-30 IMAGING — CT CT HEAD W/O CM
3 series · 16 of 47 positions shown, 19 images · non-contrast
Comparison: [DATE]

CLINICAL DATA: Mental status changes

EXAM:
CT HEAD WITHOUT CONTRAST
TECHNIQUE: Contiguous axial images were obtained from the base of the skull
through the vertex without intravenous contrast.

[Series 2: head wo · axial · 0.45mm/px · z∈[-171,-31]mm · 10 of 34 slices shown, 13 images]
[im 3/34  brain]
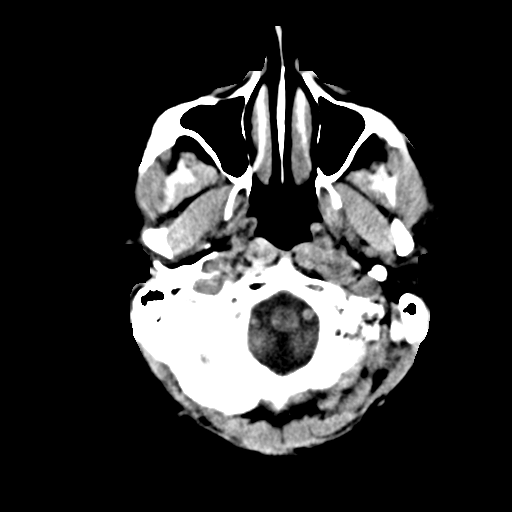
[im 3/34  bone]
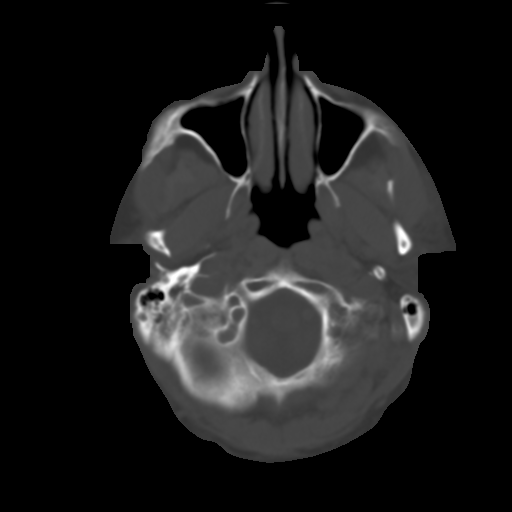
[im 6/34  brain]
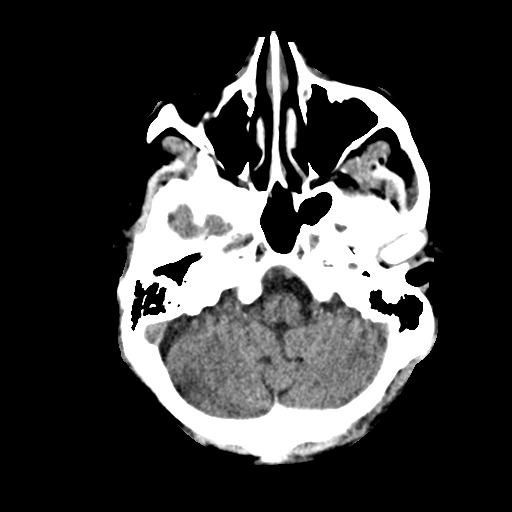
[im 10/34  brain]
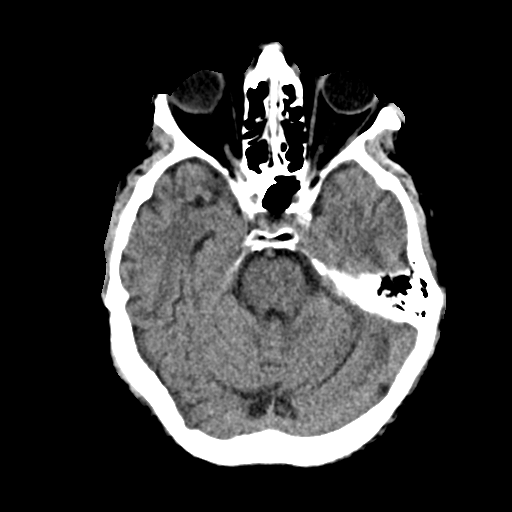
[im 12/34  brain]
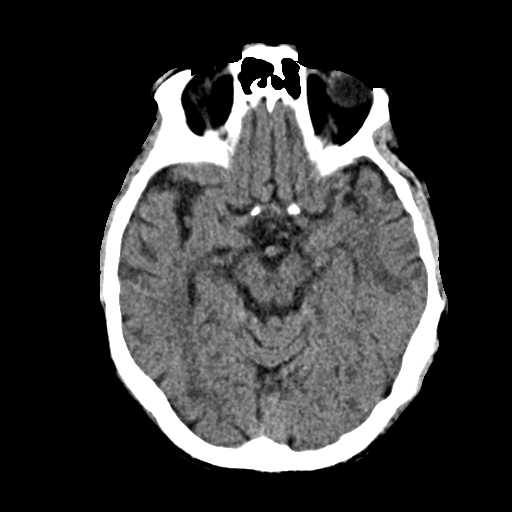
[im 15/34  brain]
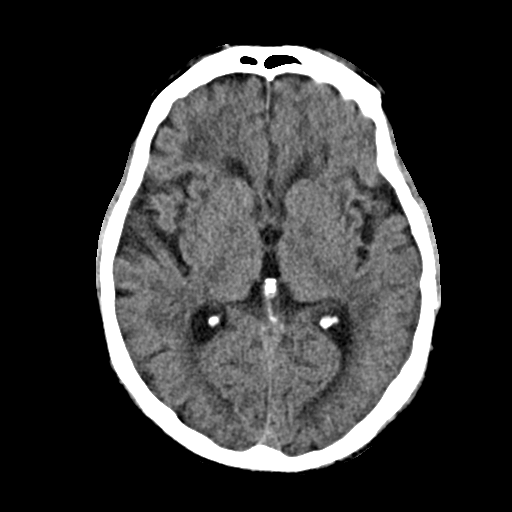
[im 15/34  bone]
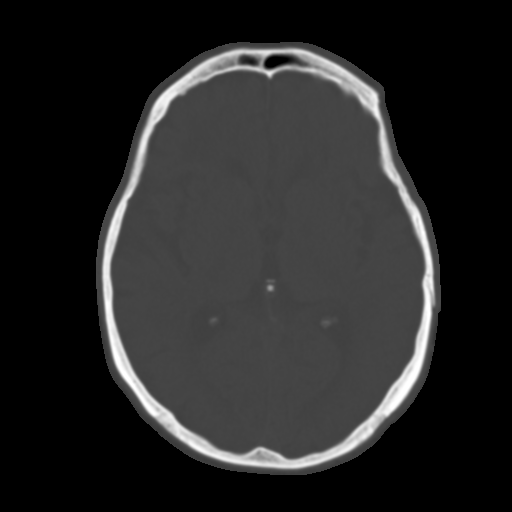
[im 19/34  brain]
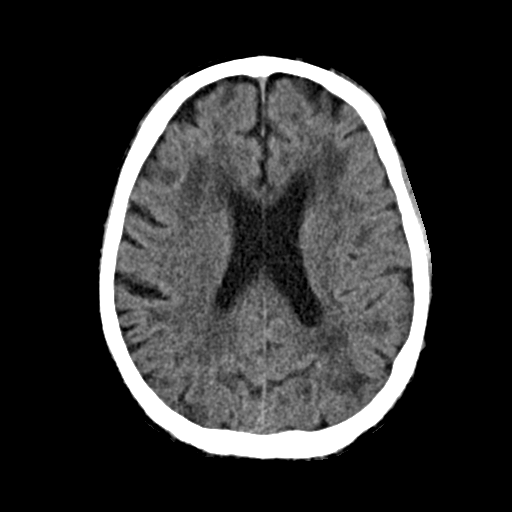
[im 22/34  brain]
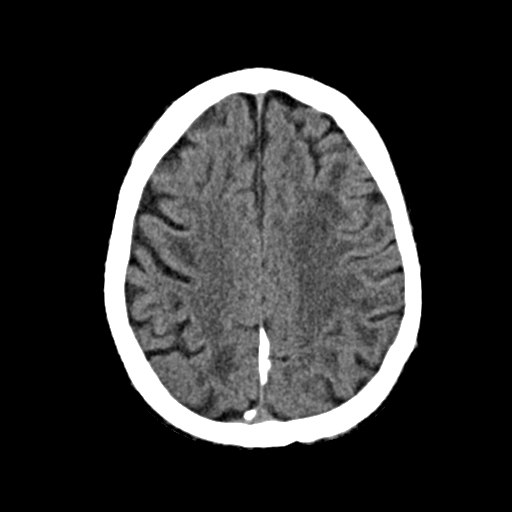
[im 26/34  brain]
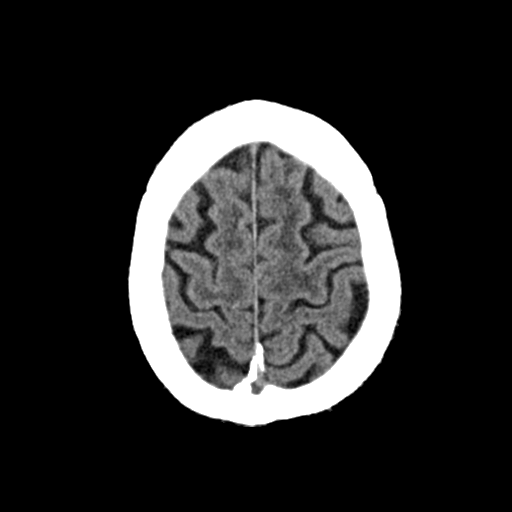
[im 28/34  brain]
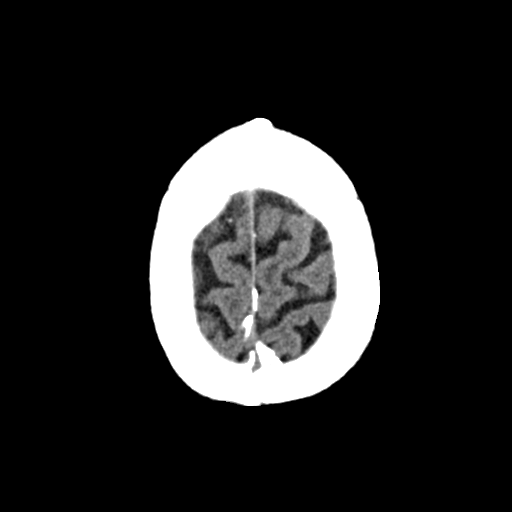
[im 28/34  bone]
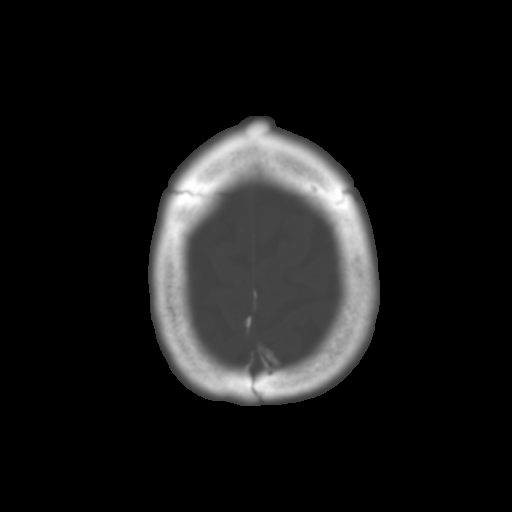
[im 31/34  brain]
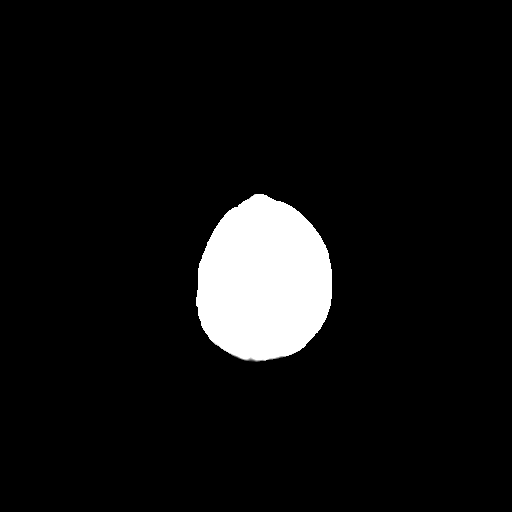

[Series 4: coronal soft · coronal · 0.34mm/px · 3 of 69 slices shown]
[im 23/69  brain]
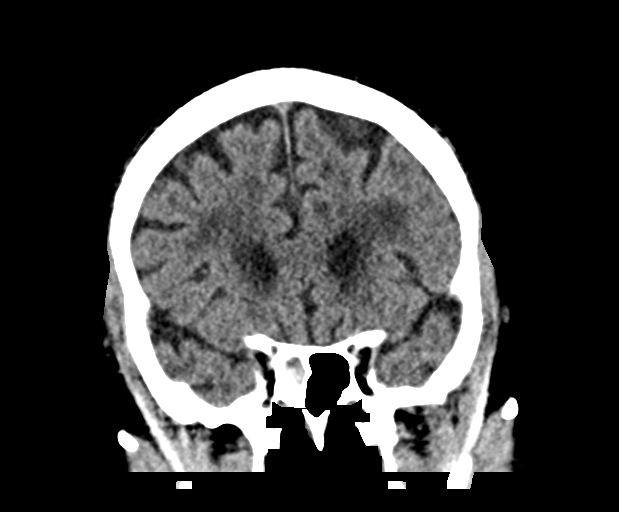
[im 31/69  brain]
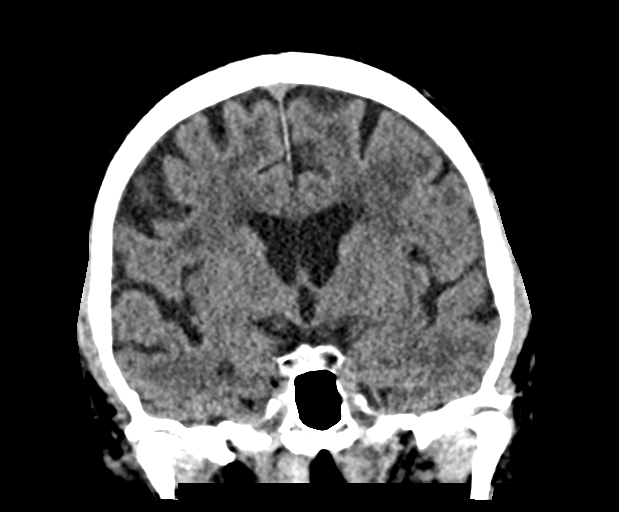
[im 38/69  brain]
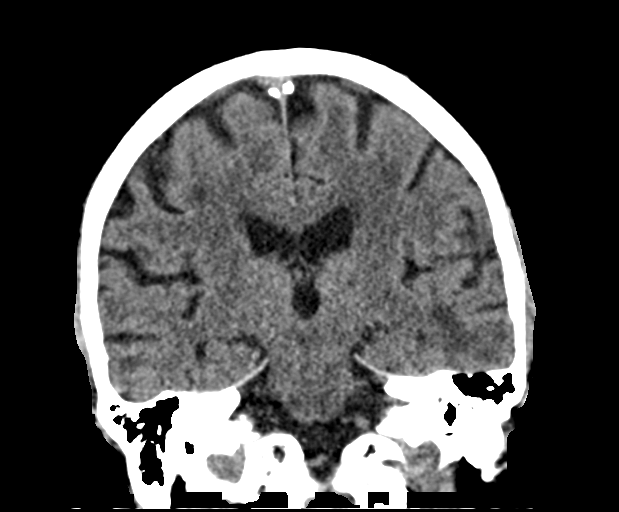

[Series 5: sag soft · sagittal · 0.34mm/px · 3 of 59 slices shown]
[im 20/59  brain]
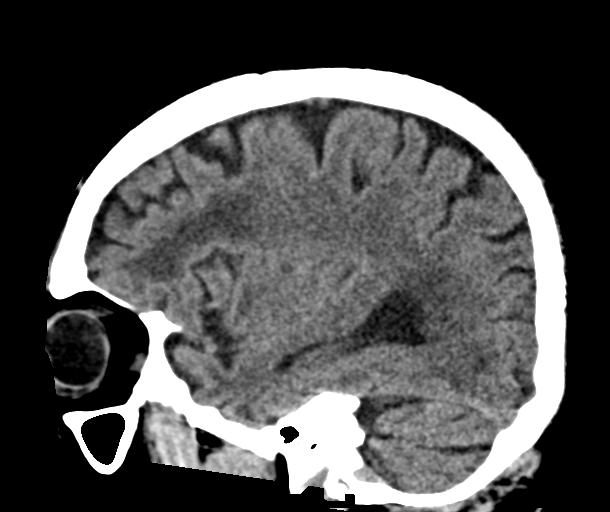
[im 30/59  brain]
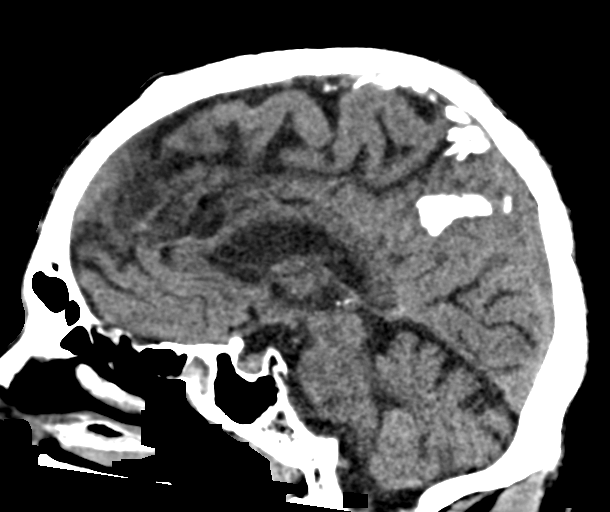
[im 39/59  brain]
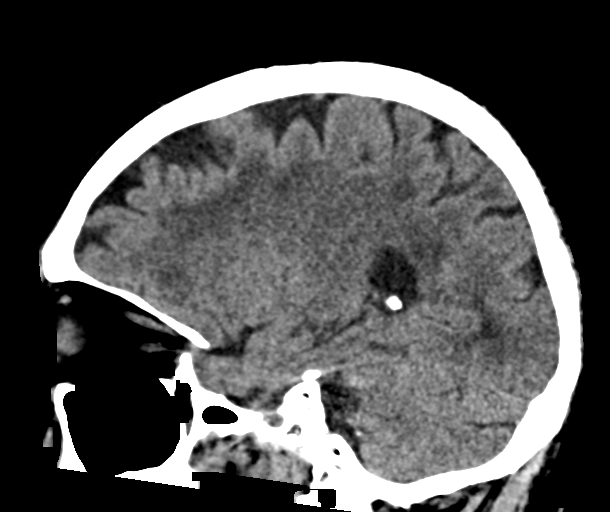

[16 of 47 positions shown; findings below may reference images not displayed]

FINDINGS: Brain: There is atrophy and chronic small vessel disease changes. No
acute intracranial abnormality. Specifically, no hemorrhage,
hydrocephalus, mass lesion, acute infarction, or significant
intracranial injury.

Vascular: No hyperdense vessel or unexpected calcification.

Skull: No acute calvarial abnormality.

Sinuses/Orbits: No acute findings

Other: None
IMPRESSION: Atrophy, chronic microvascular disease.

No acute intracranial abnormality.

## 2020-06-30 IMAGING — DX DG SHOULDER 1V*L*
1 series · 2 of 2 positions shown · non-contrast
Comparison: None.

CLINICAL DATA: Shoulder pain

EXAM:
LEFT SHOULDER

[Series 1: shoulder · 0.14mm/px · 2 of 2 slices shown]
[im 1/2]
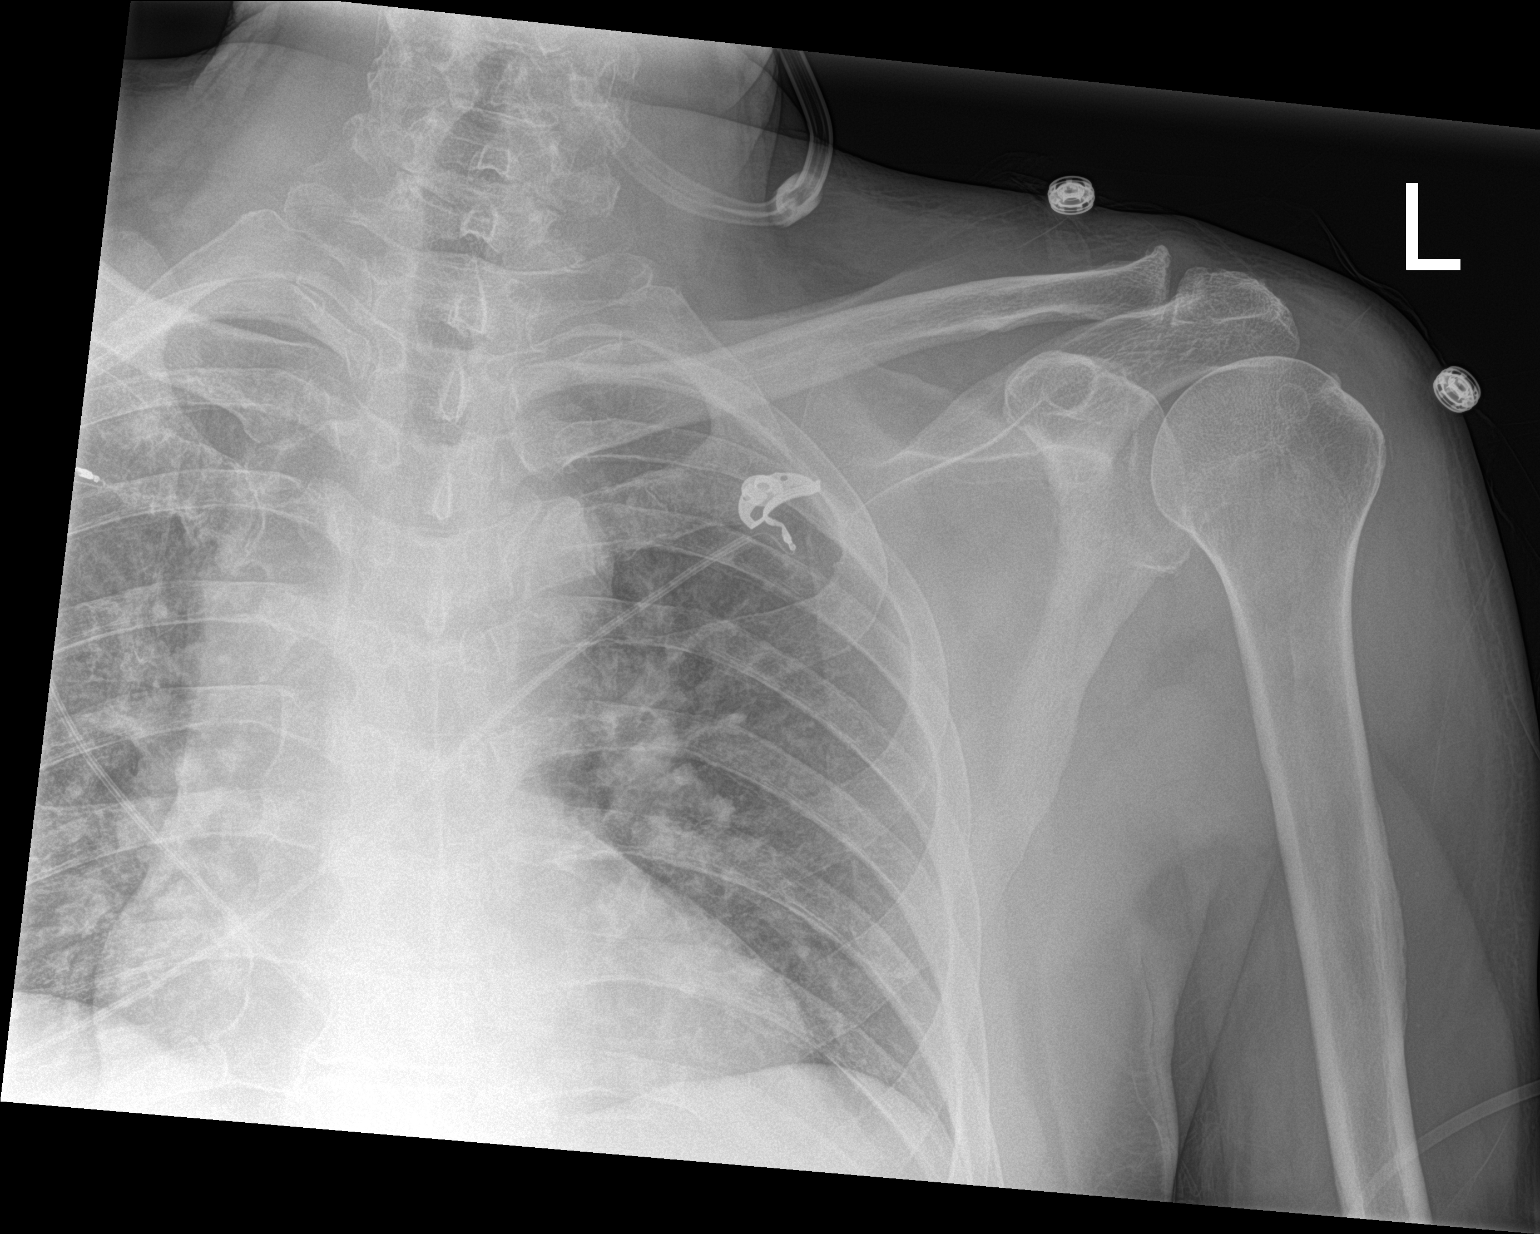
[im 2/2]
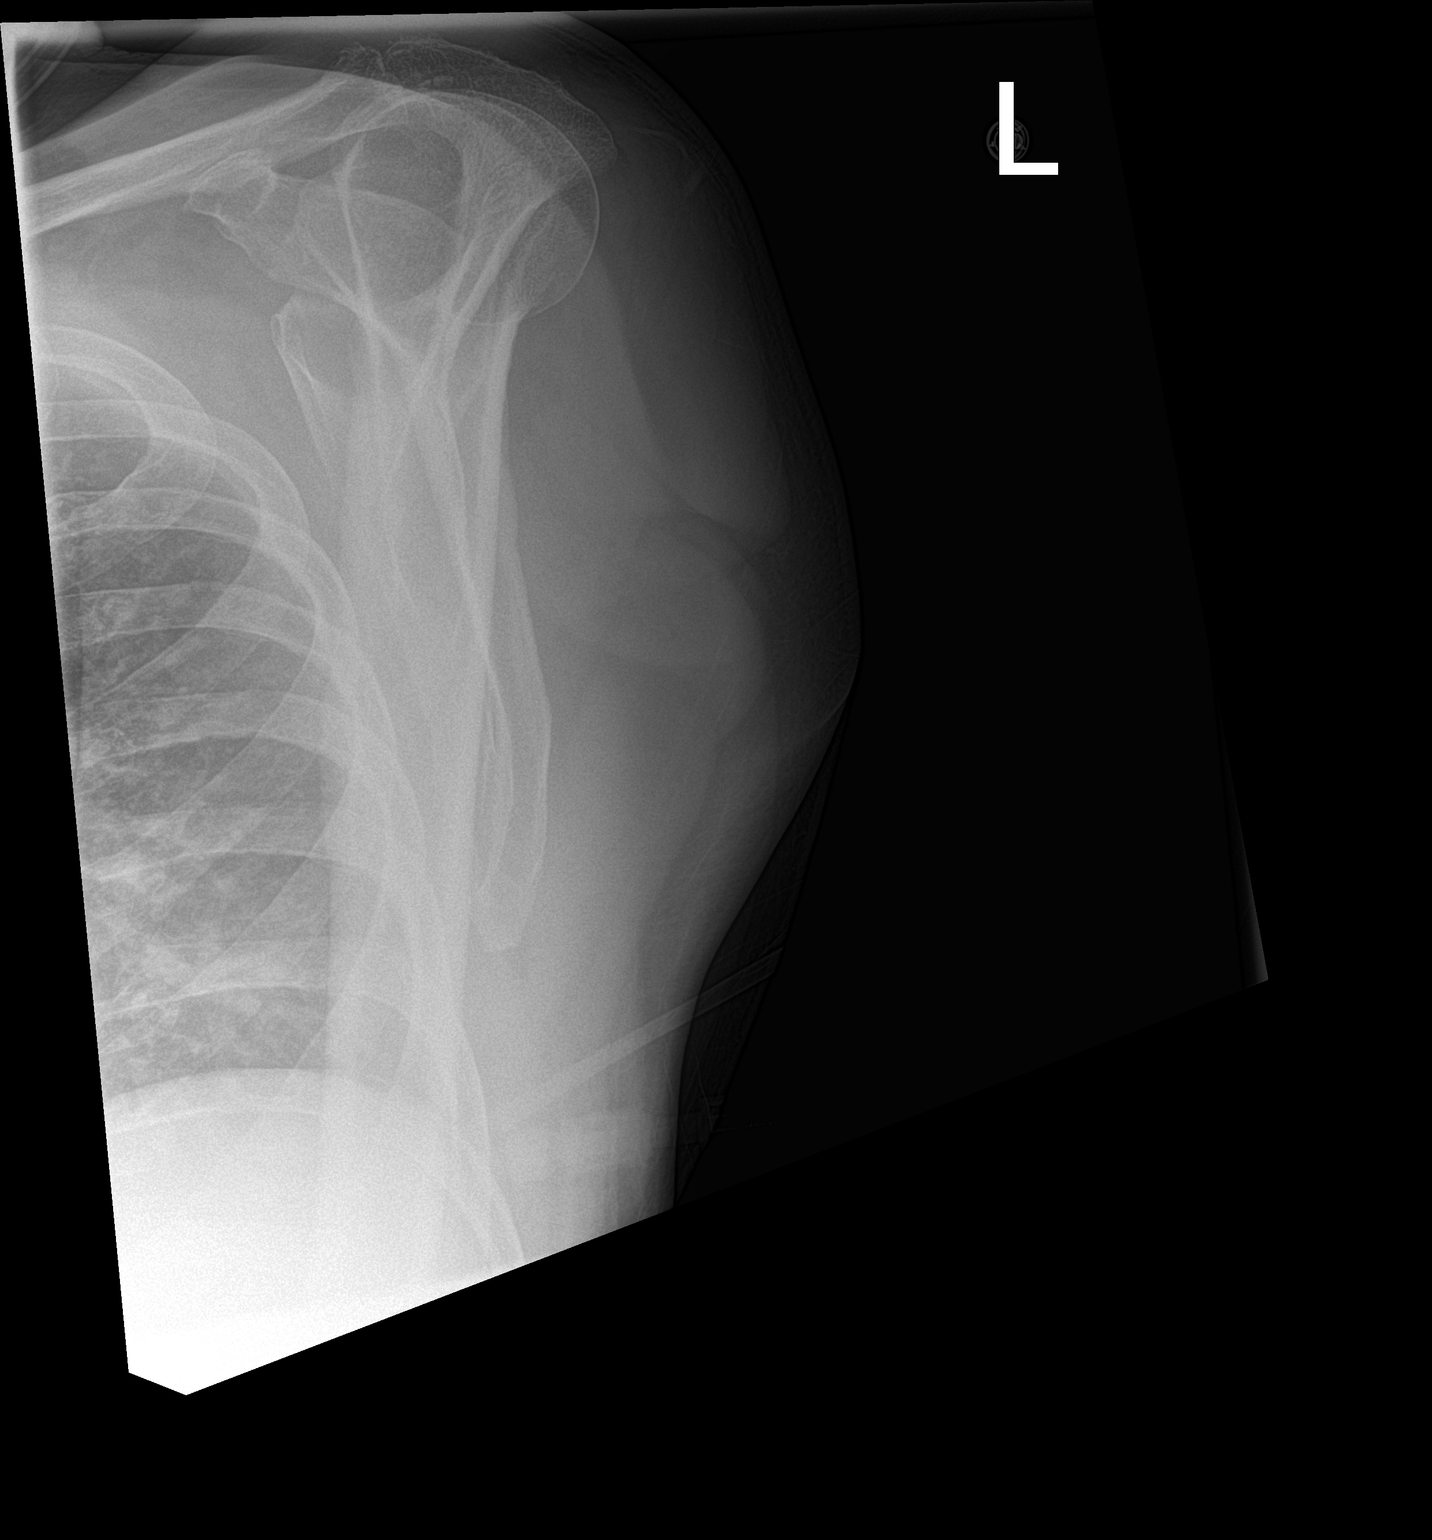

[2 of 2 positions shown; findings below may reference images not displayed]

FINDINGS: Mild AC joint degenerative change. Probable small amount of calcific
tendinitis. No fracture or dislocation.
IMPRESSION: 1. No acute osseous abnormality.
2. Probable small amount of calcific tendinitis.

## 2020-06-30 IMAGING — DX DG CHEST 1V PORT
1 series · 1 of 1 positions shown · non-contrast
Comparison: [DATE] chest radiograph.

CLINICAL DATA: Hand pain after injury, confusion

EXAM:
PORTABLE CHEST 1 VIEW

[chest ap]
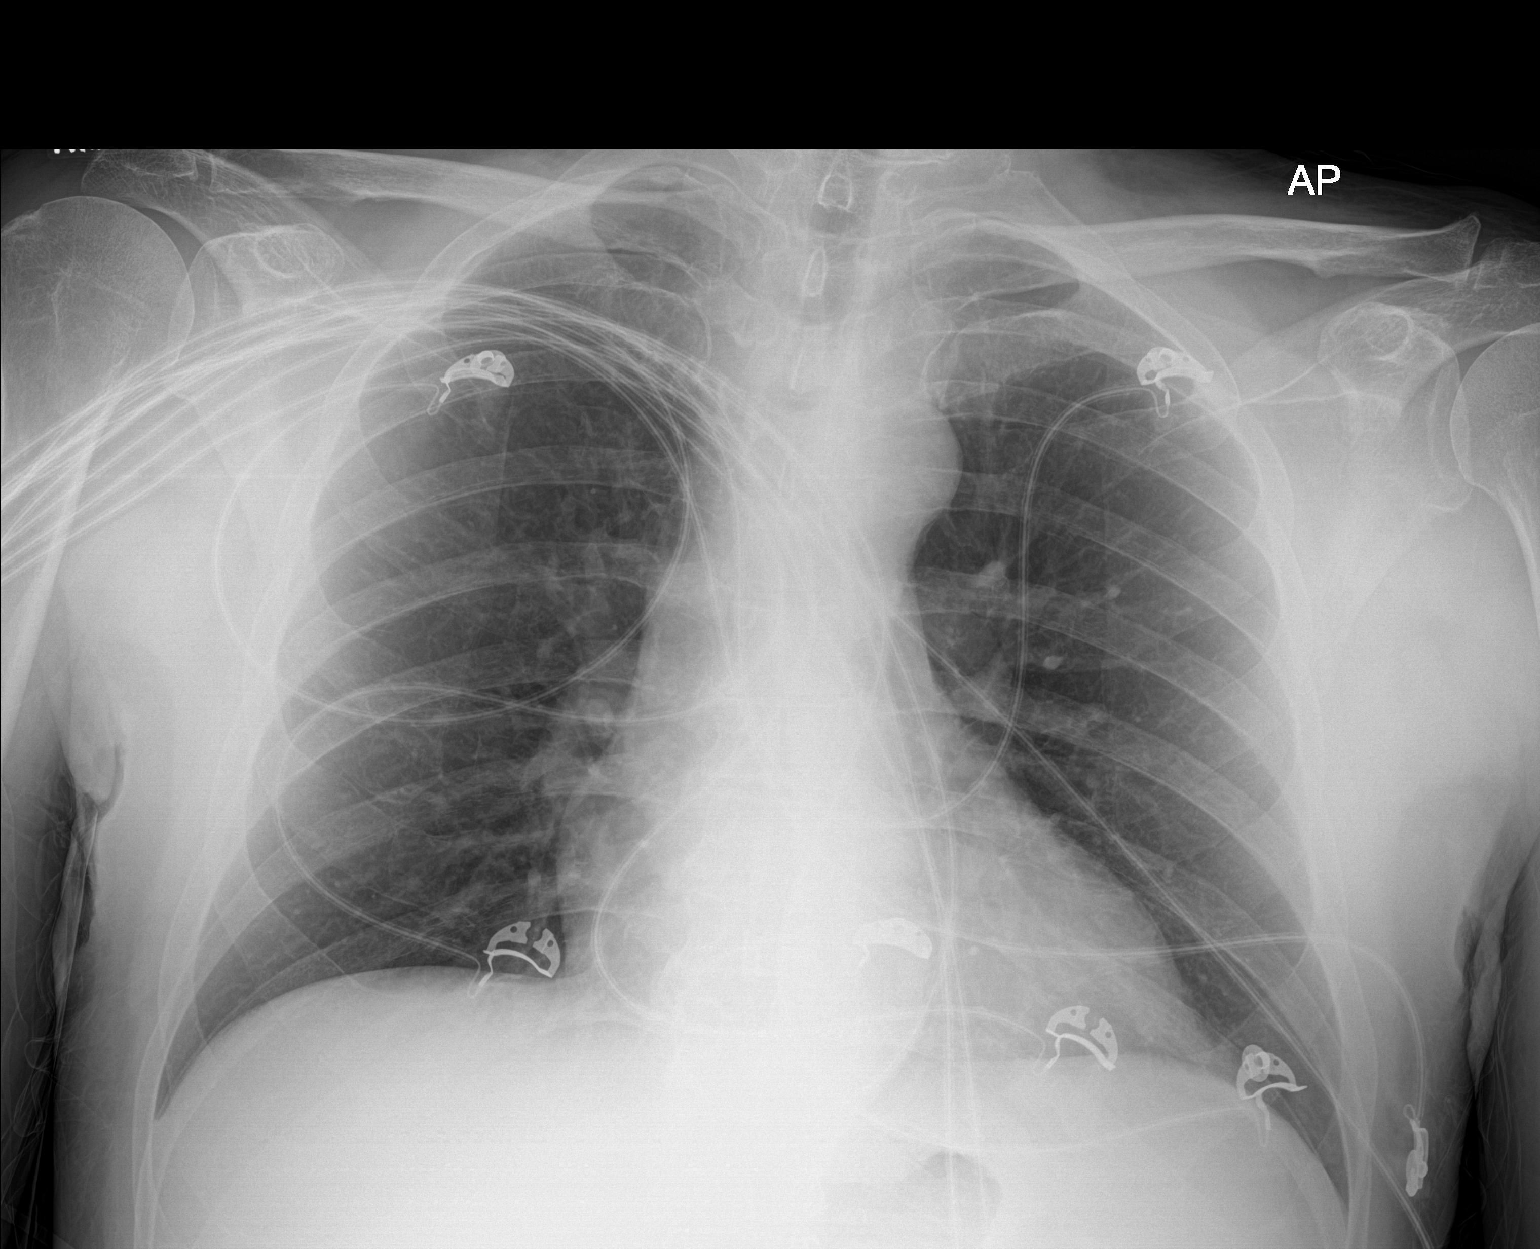

[1 of 1 positions shown; findings below may reference images not displayed]

FINDINGS: Stable cardiomediastinal silhouette with normal heart size. No
pneumothorax. No pleural effusion. Lungs appear clear, with no acute
consolidative airspace disease and no pulmonary edema.
IMPRESSION: No active disease.

## 2020-06-30 IMAGING — DX DG HAND COMPLETE 3+V*R*
3 series · 3 of 3 positions shown · non-contrast
Comparison: None.

CLINICAL DATA: Right hand pain, possible injury

EXAM:
RIGHT HAND - COMPLETE 3+ VIEW

[hand ap]
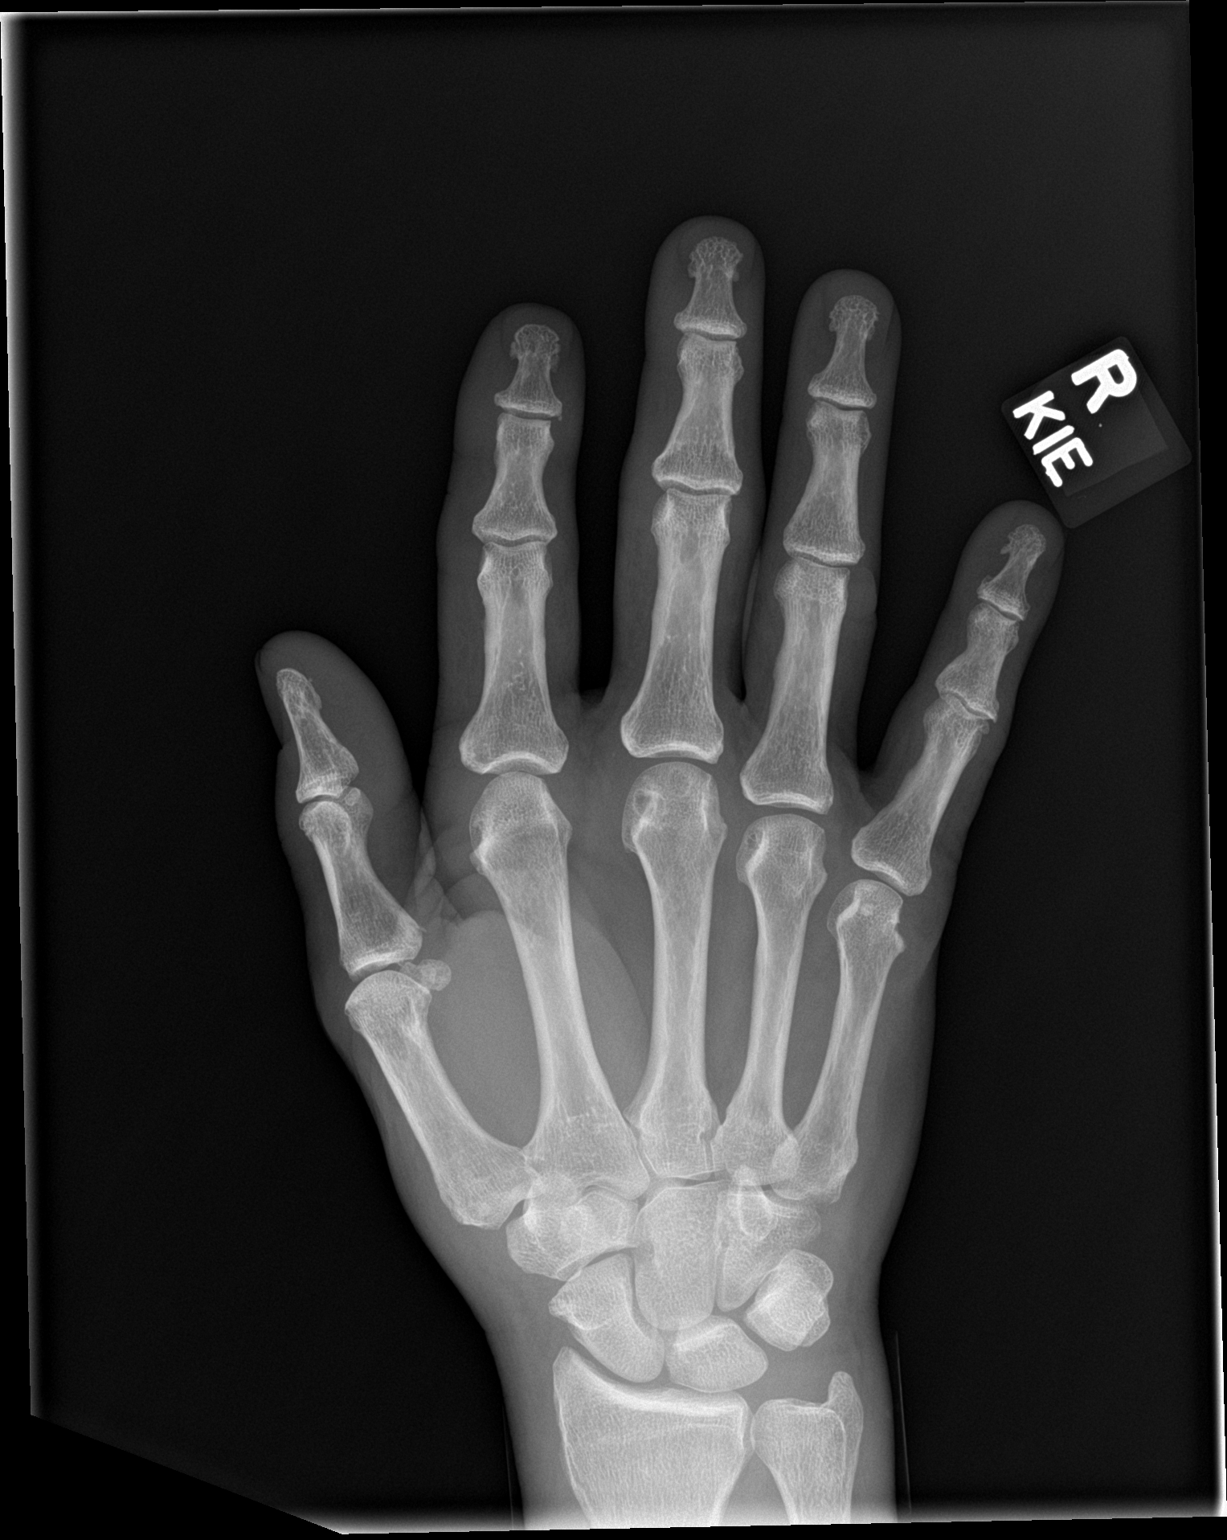

[hand obl]
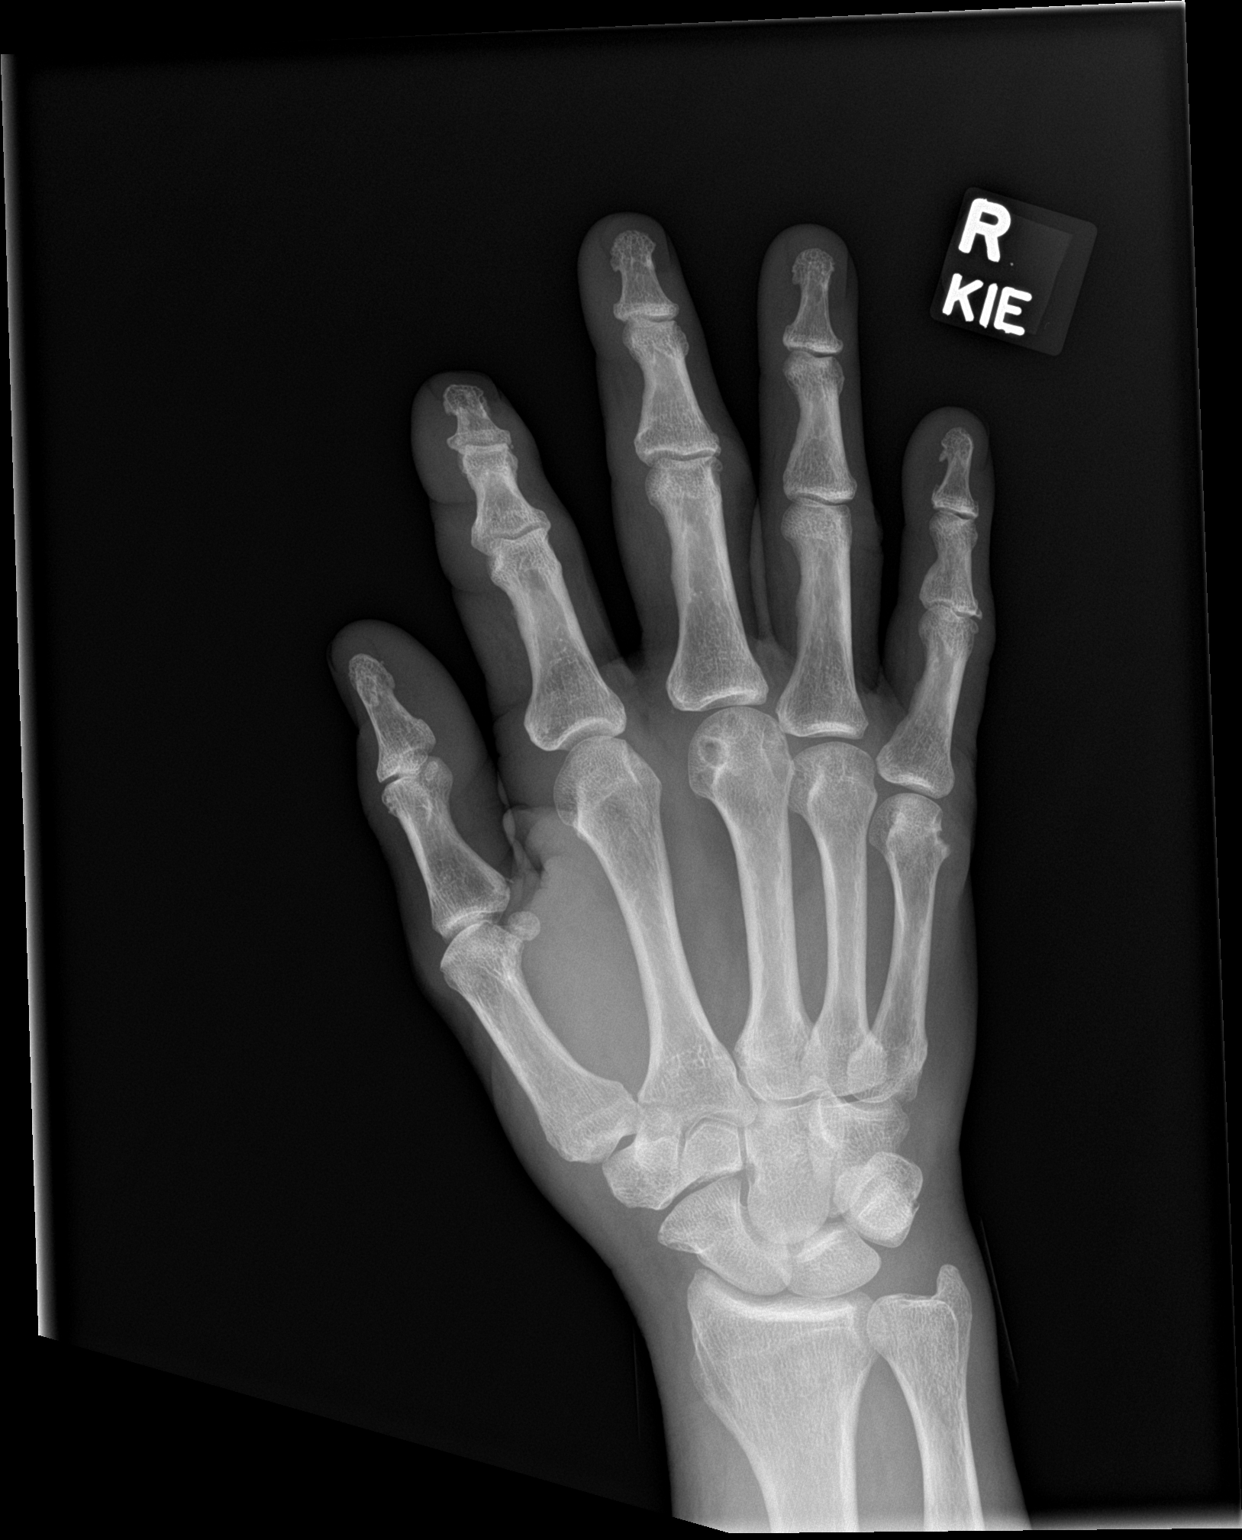

[hand lat]
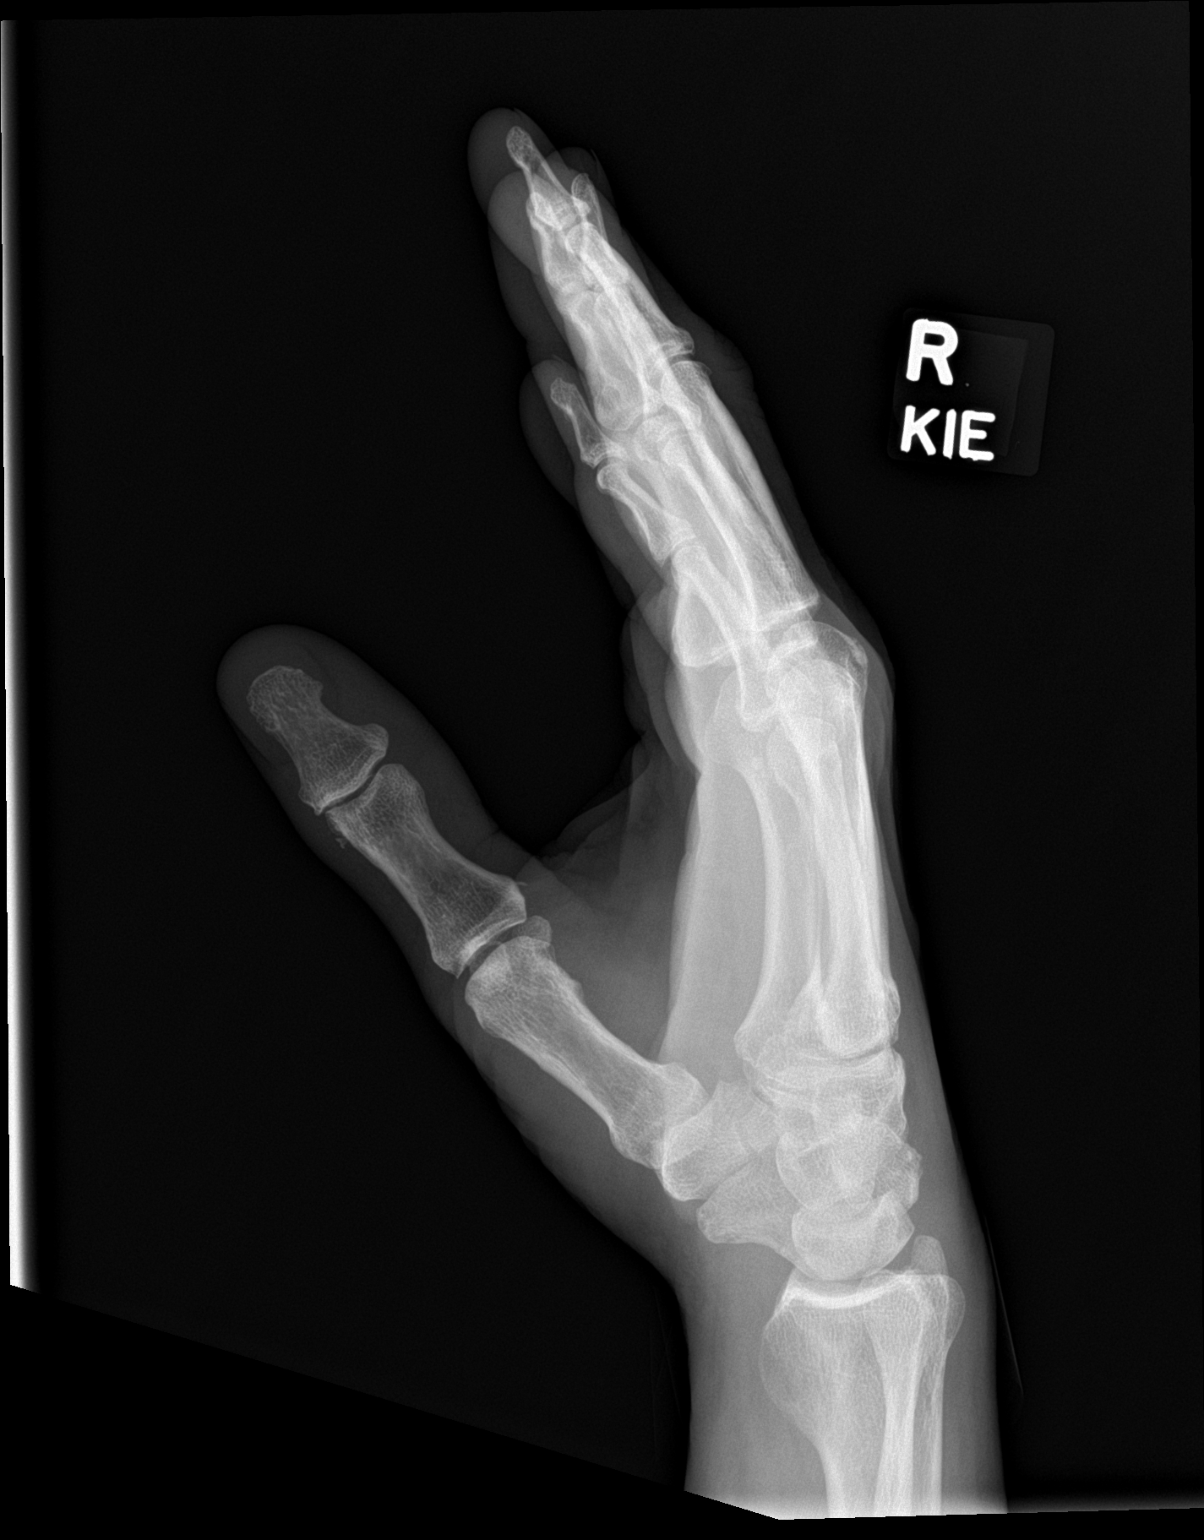

[3 of 3 positions shown; findings below may reference images not displayed]

FINDINGS: There is no evidence of fracture or dislocation. No evidence of
inflammatory arthropathy. Circumscribed lucencies in the heads of
the third and fourth metacarpal demonstrate a thin peripheral
sclerotic margin. These most likely represent subchondral geodes.
Soft tissues are unremarkable.
IMPRESSION: Negative.

## 2020-06-30 IMAGING — DX DG SHOULDER 2+V PORT*R*
1 series · 2 of 2 positions shown · non-contrast
Comparison: None.

CLINICAL DATA: Acute shoulder pain

EXAM:
PORTABLE RIGHT SHOULDER

[Series 1: shoulder · 0.14mm/px · 2 of 2 slices shown]
[im 1/2]
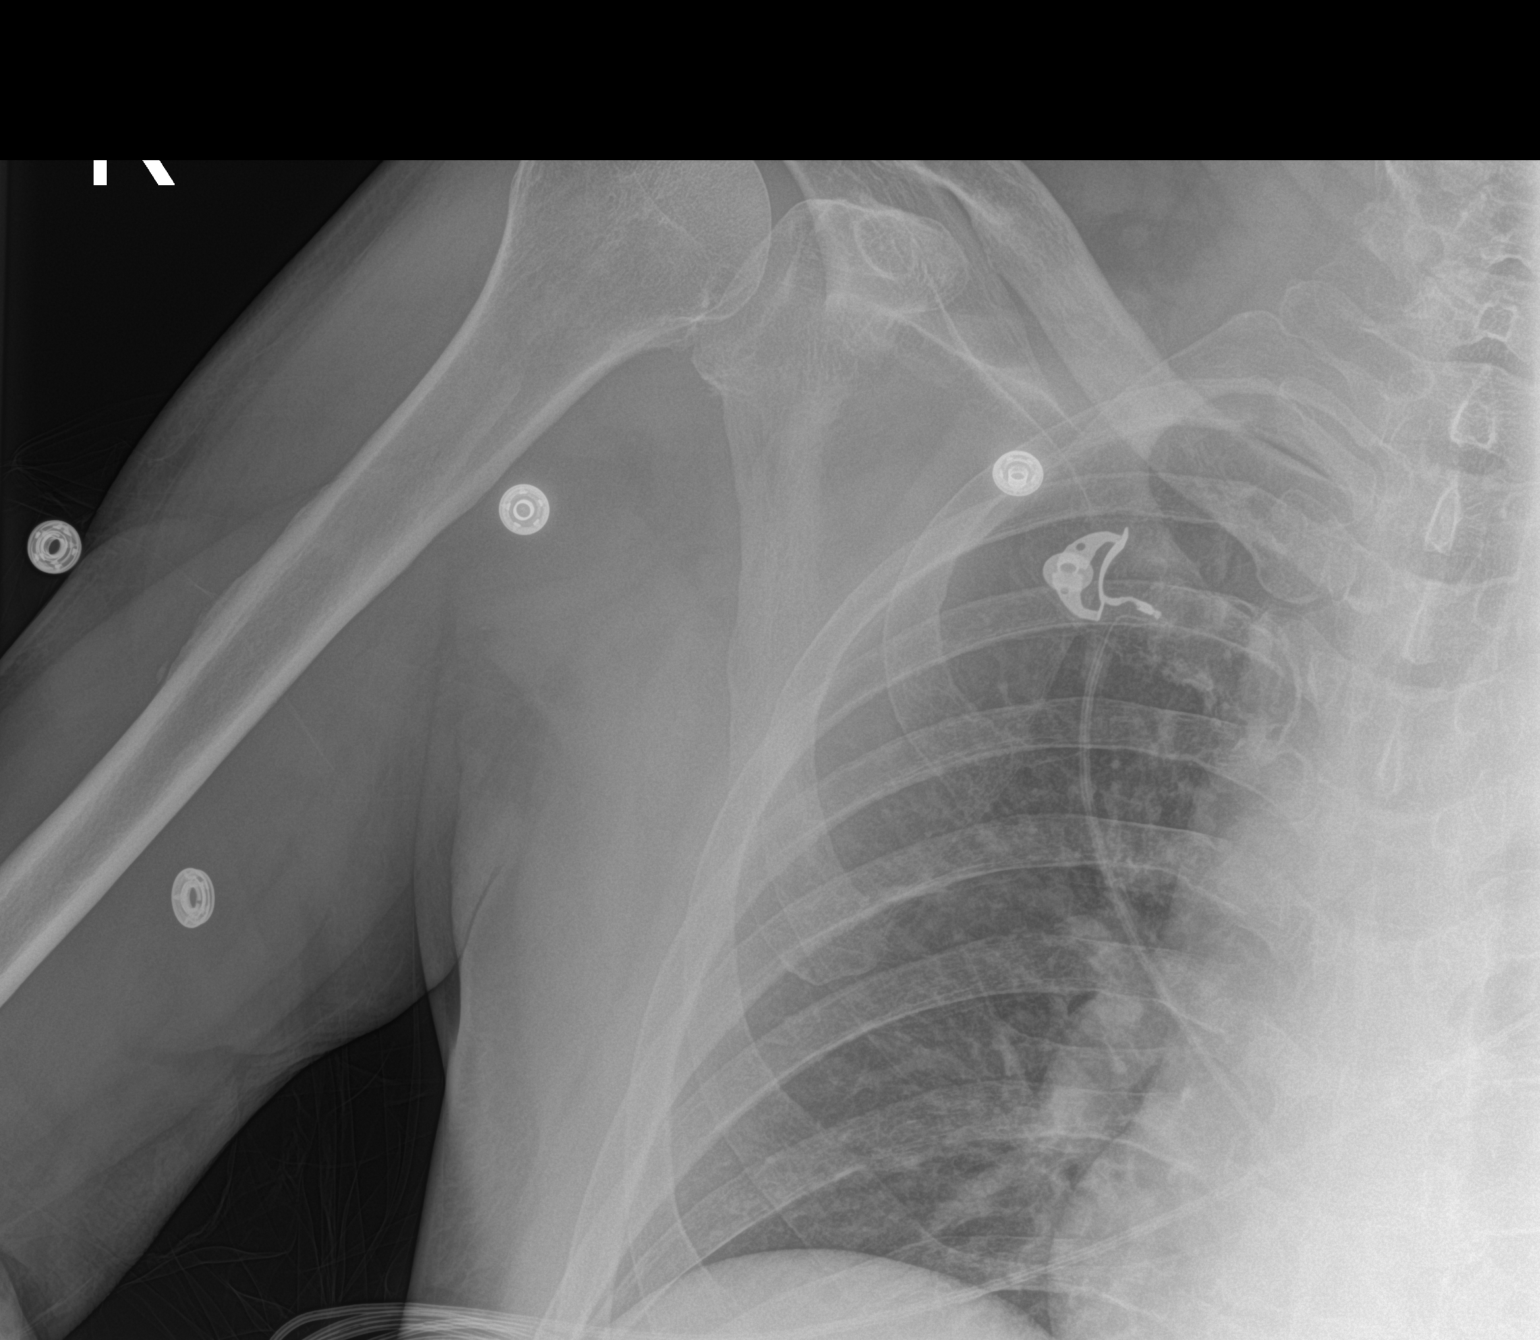
[im 2/2]
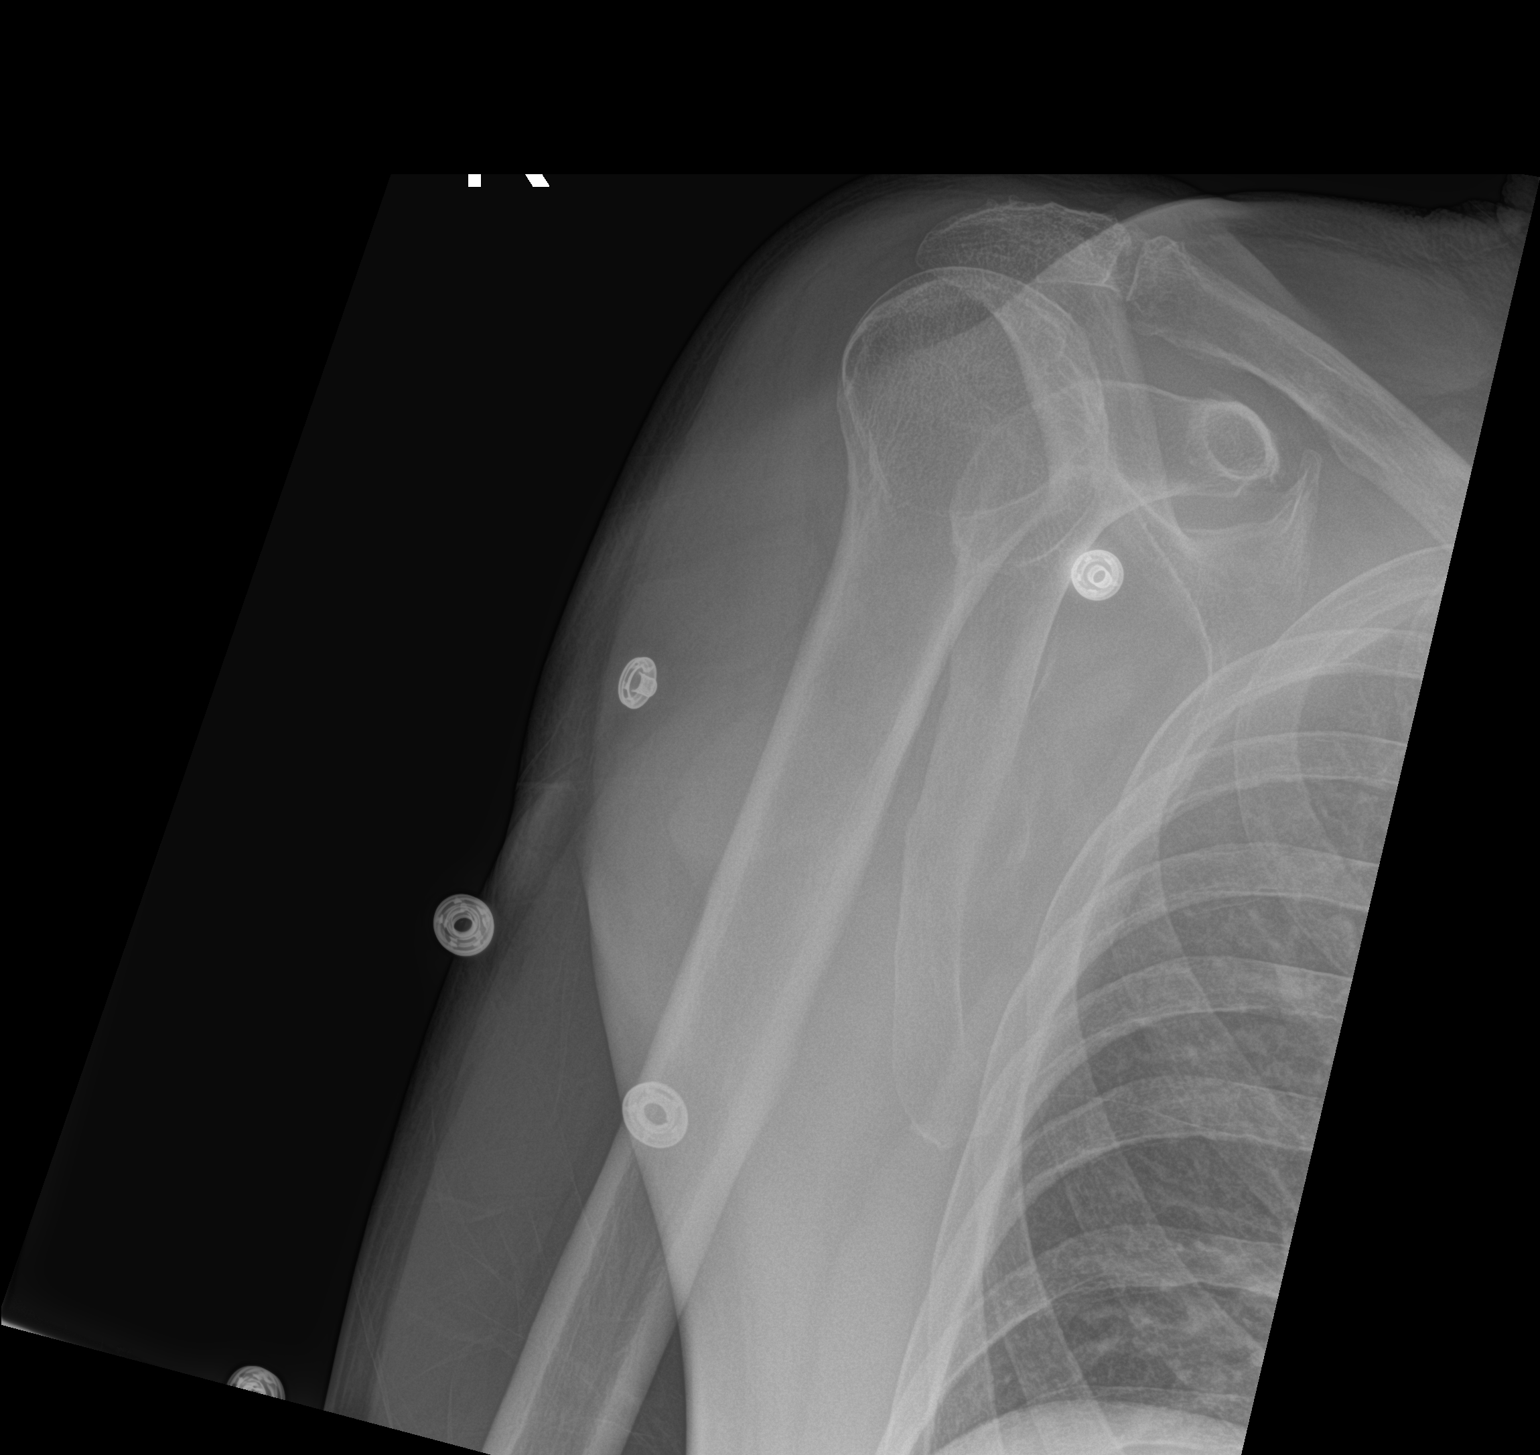

[2 of 2 positions shown; findings below may reference images not displayed]

FINDINGS: Mild AC joint degenerative change. No fracture or malalignment,
limited positioning on the Y-view.
IMPRESSION: No acute osseous abnormality.

## 2020-06-30 IMAGING — DX DG HAND 2V*L*
1 series · 2 of 2 positions shown · non-contrast
Comparison: None.

CLINICAL DATA: Acute pain

EXAM:
LEFT HAND - 2 VIEW

[Series 1: hand · 0.14mm/px · 2 of 2 slices shown]
[im 1/2]
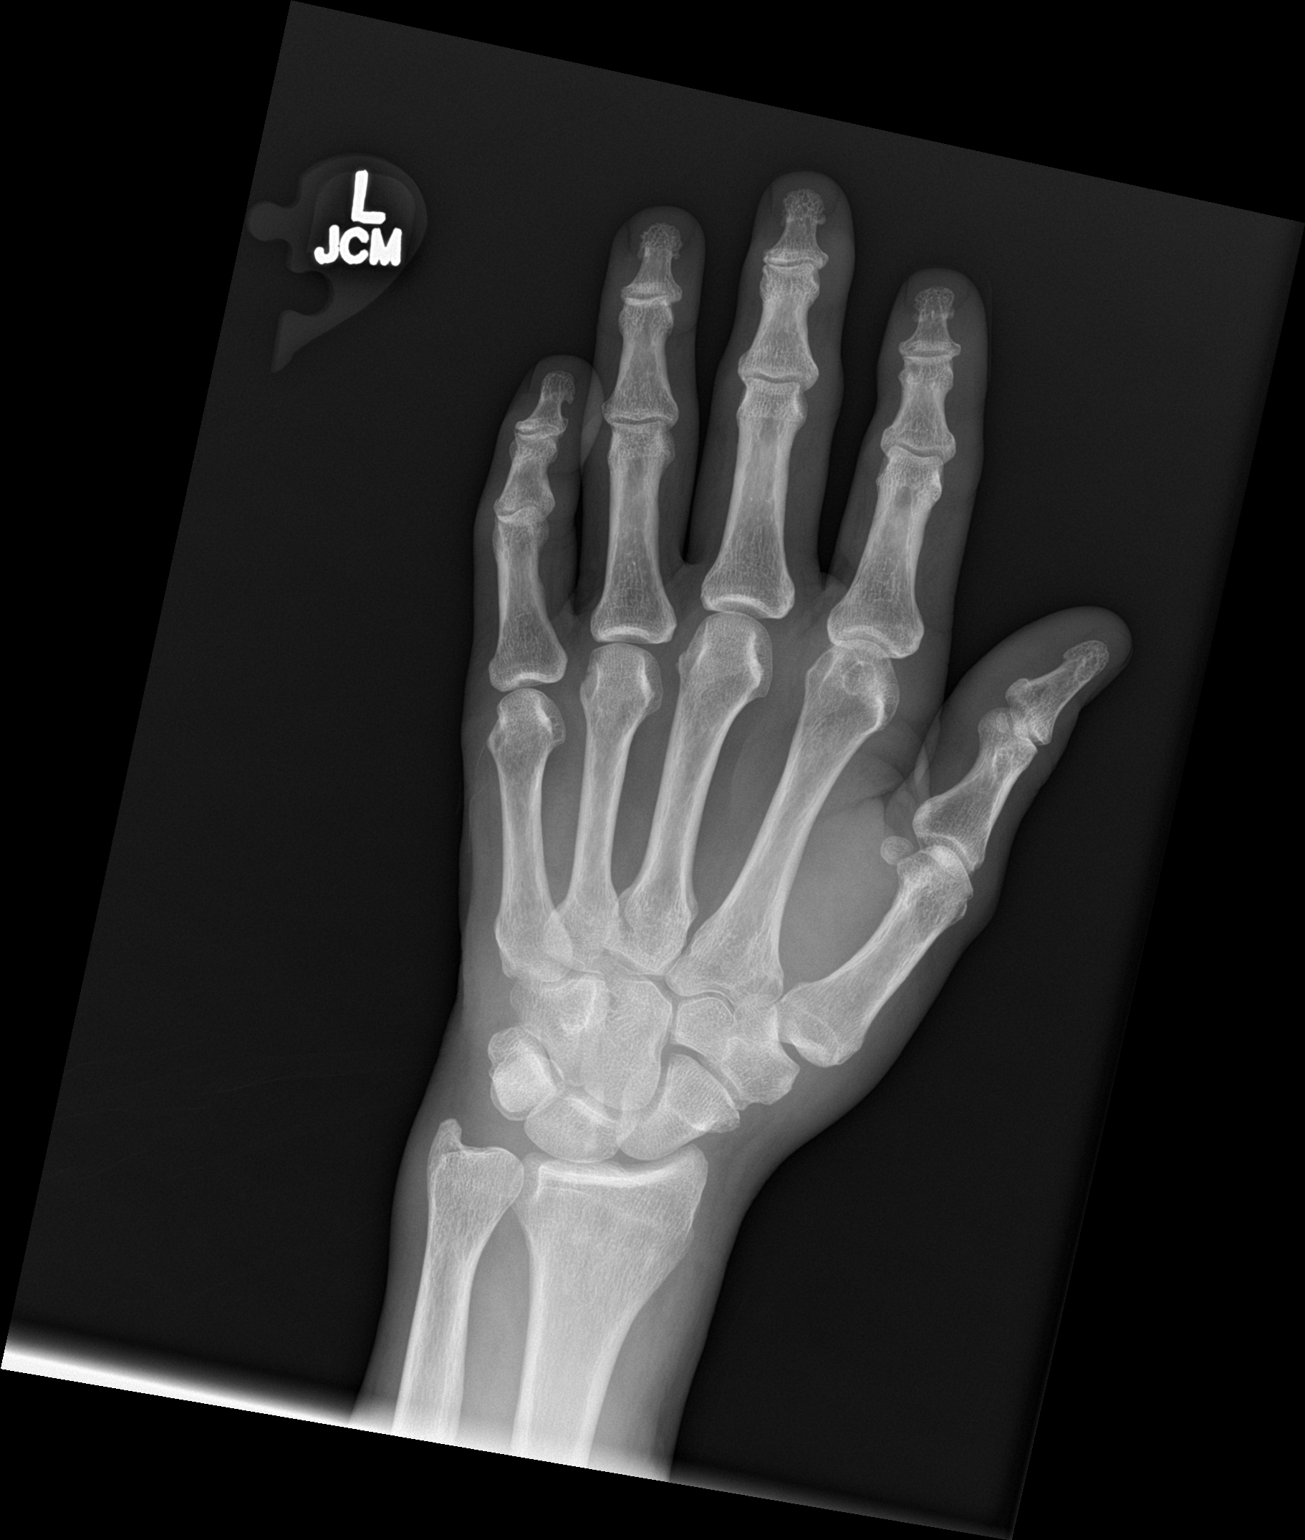
[im 2/2]
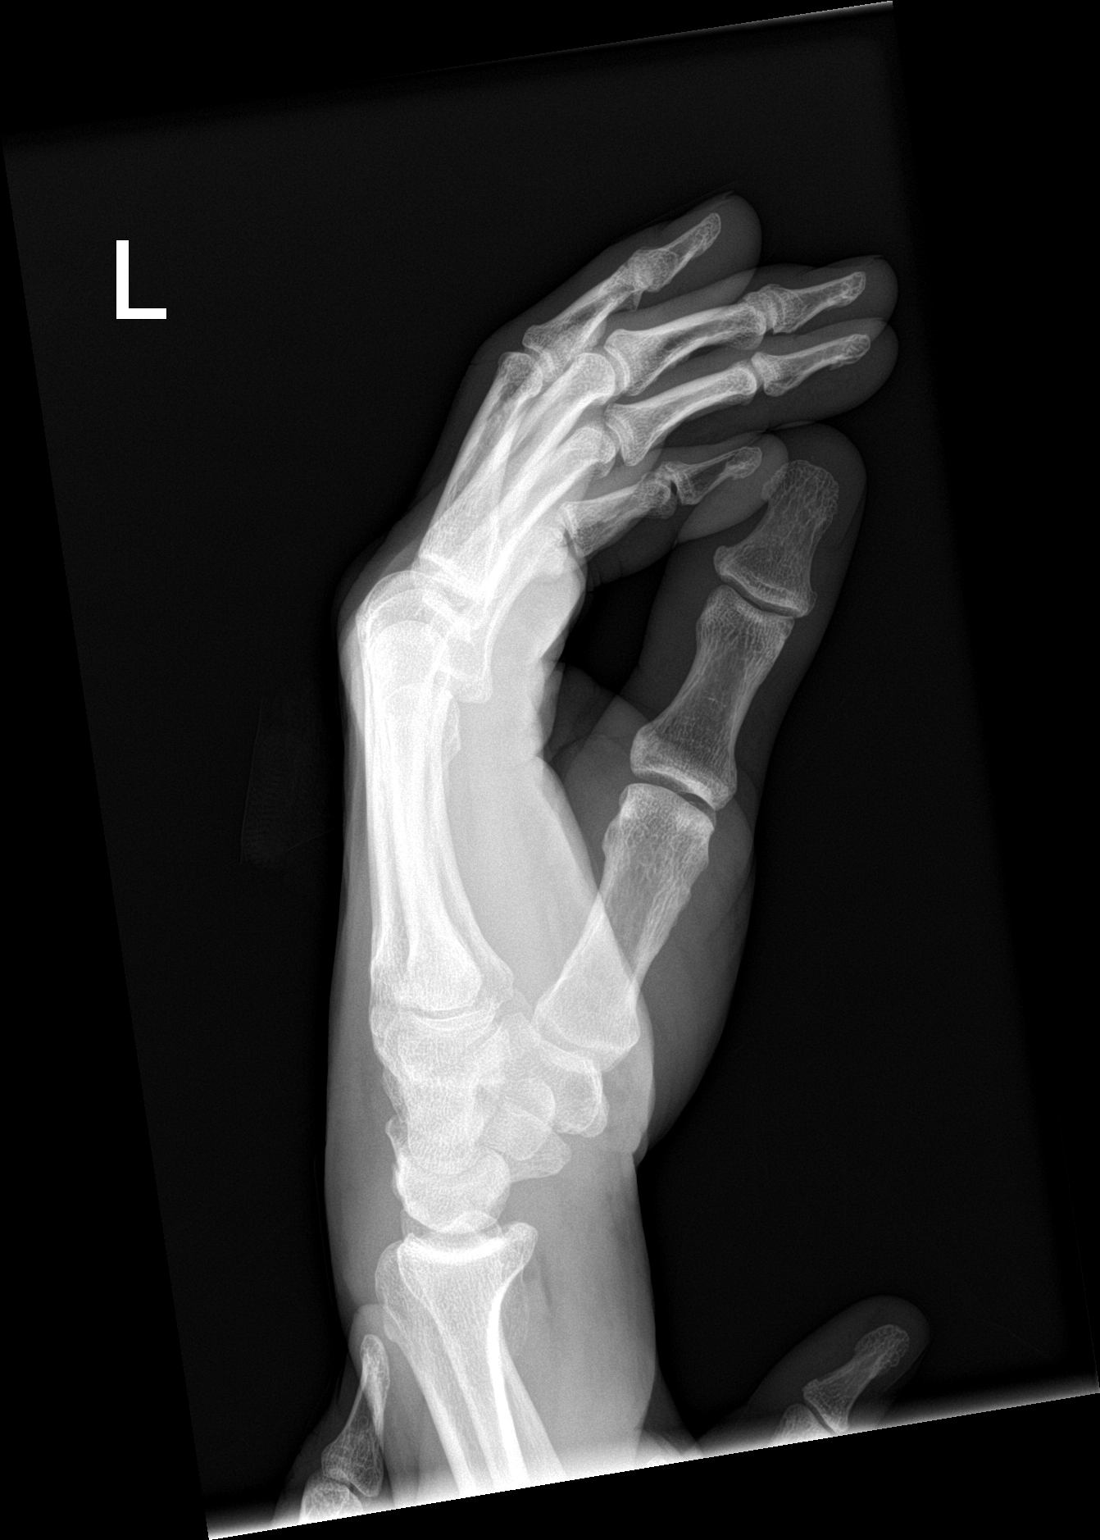

[2 of 2 positions shown; findings below may reference images not displayed]

FINDINGS: There is no evidence of fracture or dislocation. There is no
evidence of arthropathy or other focal bone abnormality. Soft
tissues are unremarkable.
IMPRESSION: Negative.

## 2020-06-30 IMAGING — DX DG HIP (WITH OR WITHOUT PELVIS) 1V PORT*L*
1 series · 1 of 1 positions shown · non-contrast
Comparison: None.

CLINICAL DATA: Acute hip pain

EXAM:
DG HIP (WITH OR WITHOUT PELVIS) 1V PORT LEFT

[hip]
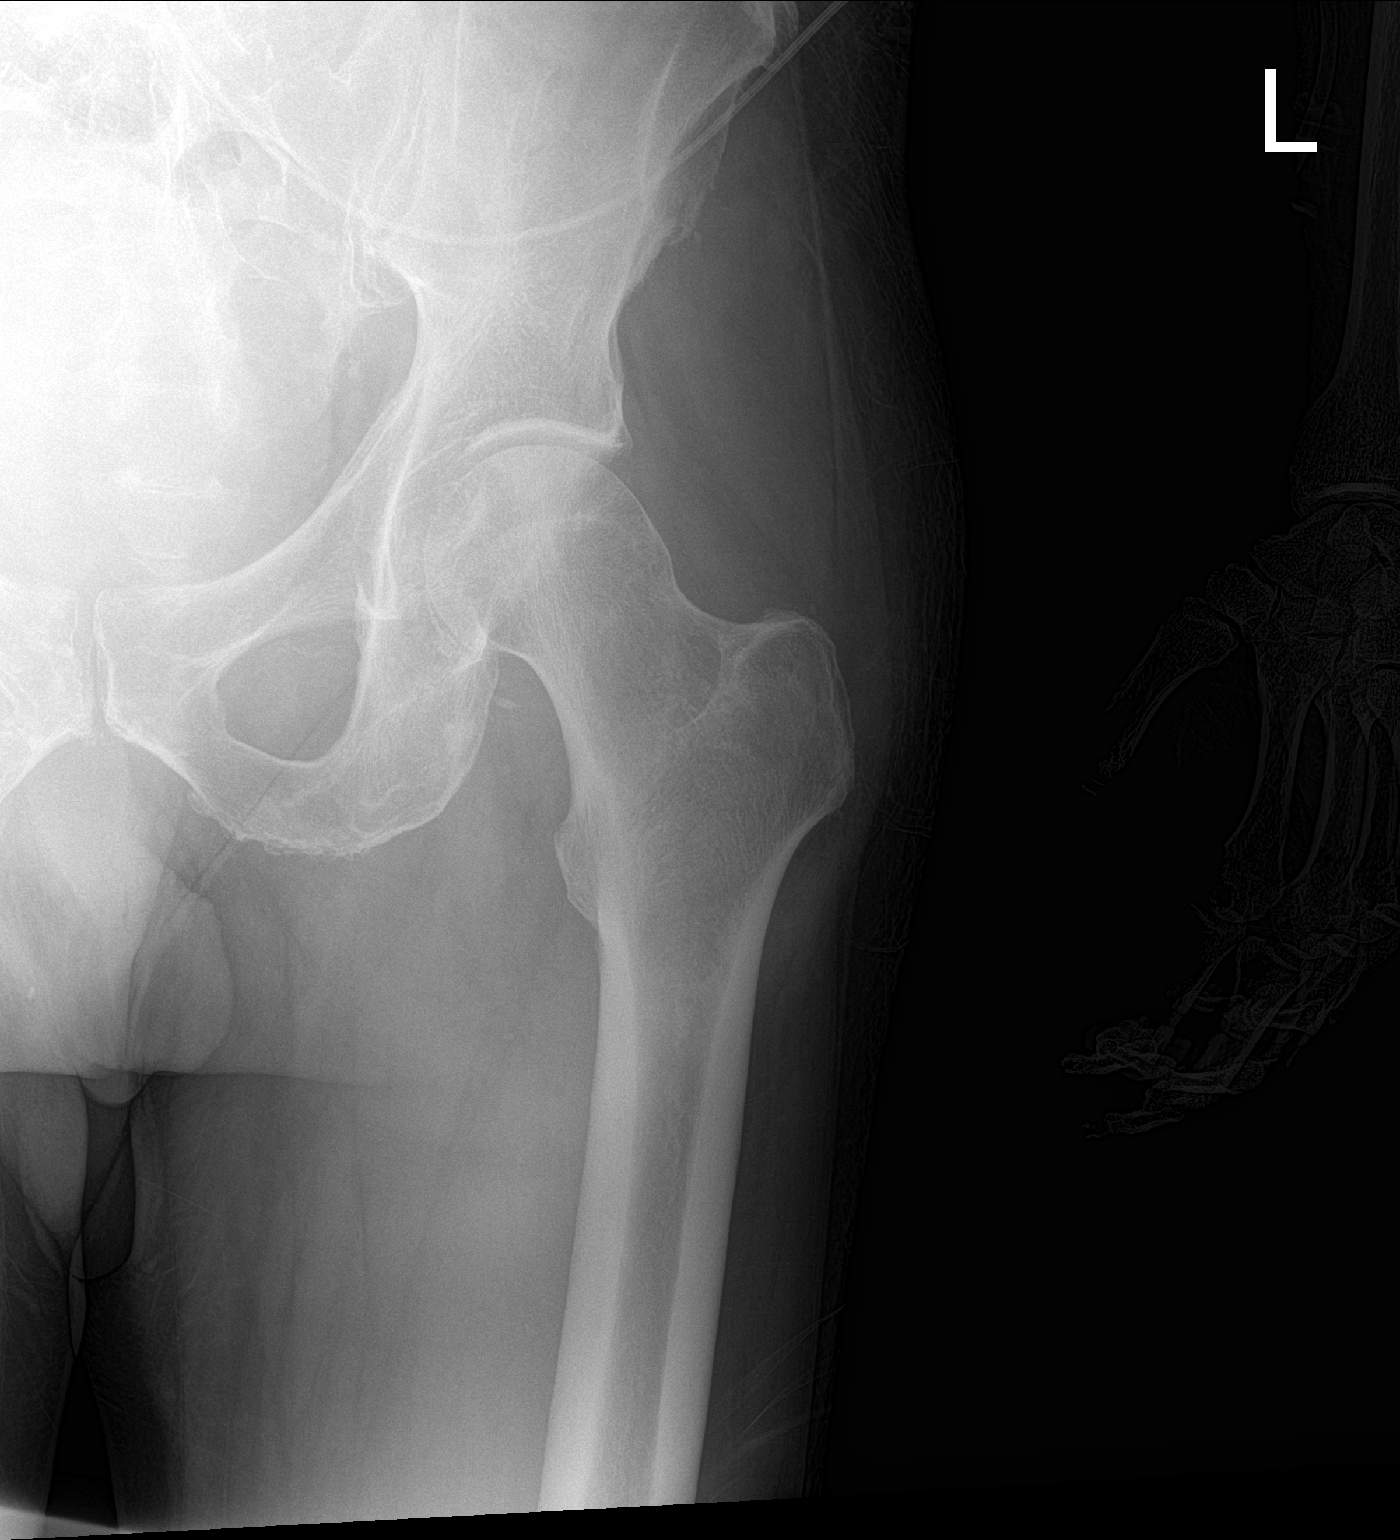

[1 of 1 positions shown; findings below may reference images not displayed]

FINDINGS: There is no evidence of hip fracture or dislocation. There is no
evidence of arthropathy or other focal bone abnormality.
IMPRESSION: Negative.

## 2020-06-30 MED ORDER — SODIUM CHLORIDE 0.9 % IV SOLN
2.0000 g | Freq: Two times a day (BID) | INTRAVENOUS | Status: DC
  Administered 2020-06-30: 2 g via INTRAVENOUS
  Filled 2020-06-30: qty 2

## 2020-06-30 MED ORDER — ALBUTEROL SULFATE (2.5 MG/3ML) 0.083% IN NEBU: 2.5000 mg | INHALATION_SOLUTION | RESPIRATORY_TRACT | Status: DC | PRN

## 2020-06-30 MED ORDER — ONDANSETRON HCL 4 MG PO TABS: 4.0000 mg | ORAL_TABLET | Freq: Four times a day (QID) | ORAL | Status: DC | PRN

## 2020-06-30 MED ORDER — SODIUM CHLORIDE 0.9 % IV SOLN
2.0000 g | Freq: Two times a day (BID) | INTRAVENOUS | Status: DC
  Administered 2020-07-01 – 2020-07-03 (×5): 2 g via INTRAVENOUS
  Filled 2020-06-30 (×5): qty 2

## 2020-06-30 MED ORDER — HYDROCHLOROTHIAZIDE 25 MG PO TABS
25.0000 mg | ORAL_TABLET | Freq: Every day | ORAL | Status: DC
  Administered 2020-06-30: 25 mg via ORAL
  Filled 2020-06-30: qty 1

## 2020-06-30 MED ORDER — LORAZEPAM 2 MG/ML IJ SOLN
0.5000 mg | Freq: Once | INTRAMUSCULAR | Status: AC
  Administered 2020-07-01: 0.5 mg via INTRAVENOUS
  Filled 2020-06-30 (×2): qty 1

## 2020-06-30 MED ORDER — ASPIRIN EC 81 MG PO TBEC
81.0000 mg | DELAYED_RELEASE_TABLET | Freq: Every day | ORAL | Status: DC
  Administered 2020-06-30 – 2020-07-12 (×12): 81 mg via ORAL
  Filled 2020-06-30 (×13): qty 1

## 2020-06-30 MED ORDER — ONDANSETRON HCL 4 MG/2ML IJ SOLN: 4.0000 mg | Freq: Four times a day (QID) | INTRAMUSCULAR | Status: DC | PRN

## 2020-06-30 MED ORDER — SODIUM CHLORIDE 0.9 % IV SOLN
2.0000 g | Freq: Once | INTRAVENOUS | Status: AC
  Administered 2020-06-30: 2 g via INTRAVENOUS
  Filled 2020-06-30: qty 2

## 2020-06-30 MED ORDER — LORAZEPAM 0.5 MG PO TABS
0.5000 mg | ORAL_TABLET | Freq: Four times a day (QID) | ORAL | Status: DC | PRN
  Administered 2020-06-30 – 2020-07-04 (×3): 0.5 mg via ORAL
  Filled 2020-06-30 (×3): qty 1

## 2020-06-30 MED ORDER — GALANTAMINE HYDROBROMIDE ER 8 MG PO CP24
16.0000 mg | ORAL_CAPSULE | Freq: Every day | ORAL | Status: DC
  Filled 2020-06-30: qty 2

## 2020-06-30 MED ORDER — LACTATED RINGERS IV SOLN: INTRAVENOUS | Status: DC

## 2020-06-30 MED ORDER — HYDROCODONE-ACETAMINOPHEN 5-325 MG PO TABS
1.0000 | ORAL_TABLET | ORAL | Status: DC | PRN
  Administered 2020-06-30 – 2020-07-01 (×2): 1 via ORAL
  Filled 2020-06-30 (×2): qty 1

## 2020-06-30 MED ORDER — LISINOPRIL-HYDROCHLOROTHIAZIDE 20-25 MG PO TABS: 1.0000 | ORAL_TABLET | Freq: Every day | ORAL | Status: DC

## 2020-06-30 MED ORDER — VANCOMYCIN HCL IN DEXTROSE 1-5 GM/200ML-% IV SOLN
1000.0000 mg | Freq: Once | INTRAVENOUS | Status: AC
  Administered 2020-06-30: 1000 mg via INTRAVENOUS
  Filled 2020-06-30: qty 200

## 2020-06-30 MED ORDER — ACETAMINOPHEN 325 MG PO TABS
650.0000 mg | ORAL_TABLET | Freq: Four times a day (QID) | ORAL | Status: DC | PRN
  Administered 2020-06-30 – 2020-07-04 (×2): 650 mg via ORAL
  Filled 2020-06-30 (×2): qty 2

## 2020-06-30 MED ORDER — ROSUVASTATIN CALCIUM 20 MG PO TABS
20.0000 mg | ORAL_TABLET | Freq: Every day | ORAL | Status: DC
  Administered 2020-06-30 – 2020-07-11 (×9): 20 mg via ORAL
  Filled 2020-06-30 (×14): qty 1

## 2020-06-30 MED ORDER — MELATONIN 3 MG PO TABS
3.0000 mg | ORAL_TABLET | Freq: Every evening | ORAL | Status: DC | PRN
Start: 2020-06-30 — End: 2020-07-11
  Administered 2020-06-30 – 2020-07-10 (×5): 3 mg via ORAL
  Filled 2020-06-30 (×7): qty 1

## 2020-06-30 MED ORDER — METRONIDAZOLE 500 MG PO TABS
500.0000 mg | ORAL_TABLET | Freq: Three times a day (TID) | ORAL | Status: DC
  Administered 2020-06-30 (×2): 500 mg via ORAL
  Filled 2020-06-30 (×2): qty 1

## 2020-06-30 MED ORDER — ACETAMINOPHEN 325 MG PO TABS
650.0000 mg | ORAL_TABLET | Freq: Once | ORAL | Status: AC
  Administered 2020-06-30: 650 mg via ORAL
  Filled 2020-06-30: qty 2

## 2020-06-30 MED ORDER — MAGNESIUM SULFATE 2 GM/50ML IV SOLN
2.0000 g | Freq: Once | INTRAVENOUS | Status: AC
  Administered 2020-07-01: 2 g via INTRAVENOUS
  Filled 2020-06-30: qty 50

## 2020-06-30 MED ORDER — ENOXAPARIN SODIUM 40 MG/0.4ML ~~LOC~~ SOLN
40.0000 mg | SUBCUTANEOUS | Status: DC
  Administered 2020-06-30 – 2020-07-01 (×2): 40 mg via SUBCUTANEOUS
  Filled 2020-06-30 (×2): qty 0.4

## 2020-06-30 MED ORDER — ESCITALOPRAM OXALATE 10 MG PO TABS
20.0000 mg | ORAL_TABLET | Freq: Every day | ORAL | Status: DC
  Administered 2020-06-30: 20 mg via ORAL
  Filled 2020-06-30: qty 2

## 2020-06-30 MED ORDER — LACTATED RINGERS IV BOLUS (SEPSIS)
1000.0000 mL | Freq: Once | INTRAVENOUS | Status: AC
  Administered 2020-06-30: 1000 mL via INTRAVENOUS

## 2020-06-30 MED ORDER — LISINOPRIL 20 MG PO TABS
20.0000 mg | ORAL_TABLET | Freq: Every day | ORAL | Status: DC
  Administered 2020-06-30: 20 mg via ORAL
  Filled 2020-06-30: qty 2

## 2020-06-30 MED ORDER — ACETAMINOPHEN 650 MG RE SUPP
650.0000 mg | Freq: Four times a day (QID) | RECTAL | Status: DC | PRN
  Administered 2020-07-01: 650 mg via RECTAL
  Filled 2020-06-30: qty 1

## 2020-06-30 MED ORDER — VANCOMYCIN HCL 750 MG/150ML IV SOLN
750.0000 mg | Freq: Two times a day (BID) | INTRAVENOUS | Status: DC
  Filled 2020-06-30 (×2): qty 150

## 2020-06-30 NOTE — H&P (Signed)
History and Physical  Patient Name: Patrick Macias     [id number]    DOB: Apr 21, 1941    DOA: 06/30/2020 PCP: Crist Infante, MD  Patient coming from: Home  Chief Complaint: Altered mental status   HPI: Patrick Macias is a 80 y.o. male with hx of vascular dementia, BPH, dyslipidemia, hypertension, who presented to the facility on 06/30/2020 secondary to altered mental status, generalized weakness, fever.  At the time of exam, patient is cephalopathic, thus a complete and thorough HPI review systems unable to be fully obtained.  Information obtained from chart and daughter and wife bedside.  Apparently on Monday, patient developed hives on different parts of his body that on his body but was primarily his back, not in 1 dermatome.  He tried to take Zyrtec with no improvement of his rash.  He had been sleeping well due to the rash.  Then apparently yesterday he hit his hand on something but family does not know what.  Then the morning presentation, patient had a fever, and wife found him in bed significantly altered, encephalopathic, with inability to get out of bed.  He had abnormal speech, shaking, and was not a self and thus EMS was contacted to bring him.  Of note, patient was recently hospitalized in the beginning of December 2021 for encephalopathy.  MRI did not show a CVA but showed findings concerning with chronic microvascular ischemia.  Neurology was consulted and evaluated patient.  Per family there was discussion that he has vascular dementia.  He was scheduled to follow-up with neurology outpatient.  Psychogenic drugs he is on are Lexapro, and galantamine.   ED course: -Initially patient was febrile, temperature 38.1 C, heart rate 87, respiratory rate 23, blood pressure 139/62. -CT of the head showed no acute processes.  Chest x-ray was unremarkable. -Relevant labs on initial presentation: Sodium 134, potassium 3.6, chloride 102, bicarb 22, glucose 203, BUN 27, creatinine 1.13,  calcium 8.6, CRP 12.6, lactic acid 2.1, troponin XVIII, WBC 7.1, hemoglobin 10.9 platelets 191, urinalysis: Bacteria many, nitrite negative, leukocytes negative, WBC negative. -Patient was transferred to [facility] for MRI of the head.     ROS: Review of Systems  Unable to perform ROS: Mental acuity          Past Medical History:  Diagnosis Date  . Anxiety   . BPH (benign prostatic hyperplasia)   . Cerumen impaction   . Dyslipidemia   . H/O lithotripsy   . Hearing decreased, bilateral   . Hx of colonic polyps   . Hypertension   . IFG (impaired fasting glucose)   . Macrocytosis   . Nephrolithiasis   . Vitamin D deficiency     Past Surgical History:  Procedure Laterality Date  . CATARACT EXTRACTION, BILATERAL    . PROSTATECTOMY      Social History: Patient lives at home.  The patient walks without assistance.  Non-smoker.  No Known Allergies  Family history: family history is not on file.  Prior to Admission medications   Medication Sig Start Date End Date Taking? Authorizing Provider  aspirin EC 81 MG tablet Take 1 tablet (81 mg total) by mouth daily. 05/11/19   Belva Crome, MD  Cholecalciferol (VITAMIN D-3) 125 MCG (5000 UT) TABS Take 1 tablet by mouth 3 (three) times a week.     [provider]  escitalopram (LEXAPRO) 20 MG tablet Take 20 mg by mouth daily.    [provider]  galantamine (RAZADYNE ER) 16  MG 24 hr capsule Take 16 mg by mouth daily with breakfast.    [provider]  lisinopril-hydrochlorothiazide (ZESTORETIC) 20-25 MG tablet Take 1 tablet by mouth daily. 04/08/19   [provider]  rosuvastatin (CRESTOR) 20 MG tablet Take 20 mg by mouth at bedtime. 05/03/19   [provider]       Physical Exam: BP (!) 144/63 (BP Location: Right Arm)   Pulse 92   Temp 100 F (37.8 C) (Oral)   Resp 18   Ht 5\' 11"  (1.803 m)   Wt 77.1 kg   SpO2 97%   BMI 23.71 kg/m  General appearance: Well-developed, adult  male, encephalopathic, no acute distress Eyes: Anicteric, conjunctiva pink, lids and lashes normal. PERRL.    ENT: No nasal deformity, discharge, epistaxis.  Hearing normal Neck: No neck masses.  Trachea midline.  No thyromegaly/tenderness. Lymph: No cervical or supraclavicular lymphadenopathy. Skin: Warm and dry.  No jaundice.  No suspicious rashes or lesions in visible areas. Cardiac: RRR, nl S1-S2, no murmurs appreciated.   No LE edema.  Radial and pedal pulses 2+ and symmetric. Respiratory: Normal respiratory rate and rhythm.  CTAB without rales or wheezes. Abdomen: Abdomen soft.  Nontender. No ascites, distension, hepatosplenomegaly.   MSK: No deformities or effusions of the large joints of the upper or lower extremities bilaterally.  No cyanosis or clubbing. Neuro: Cranial nerves 2-12 grossly.  Sensation intact to light touch. Speech is fluent.  Tremors noted.  Muscle rigidity noted Psych: A&O x1, pleasantly confused    Labs on Admission:  I have personally reviewed following labs and imaging studies: CBC: Recent Labs  Lab 06/30/20 0831  WBC 7.1  NEUTROABS 5.6  HGB 10.9*  HCT 31.7*  MCV 99.7  PLT 181   Basic Metabolic Panel: Recent Labs  Lab 06/30/20 0831  NA 134*  K 3.6  CL 102  CO2 22  GLUCOSE 203*  BUN 27*  CREATININE 1.13  CALCIUM 8.6*   GFR: Estimated Creatinine Clearance: 58.3 mL/min (by C-G formula based on SCr of 1.13 mg/dL).  Liver Function Tests: Recent Labs  Lab 06/30/20 0831  AST 24  ALT 16  ALKPHOS 29*  BILITOT 1.5*  PROT 6.7  ALBUMIN 3.6   No results for input(s): LIPASE, AMYLASE in the last 168 hours. No results for input(s): AMMONIA in the last 168 hours. Coagulation Profile: Recent Labs  Lab 06/30/20 0831  INR 1.2   Cardiac Enzymes: No results for input(s): CKTOTAL, CKMB, CKMBINDEX, TROPONINI in the last 168 hours. BNP (last 3 results) No results for input(s): PROBNP in the last 8760 hours. HbA1C: No results for input(s):  HGBA1C in the last 72 hours. CBG: Recent Labs  Lab 06/30/20 1544  GLUCAP 114*   Lipid Profile: No results for input(s): CHOL, HDL, LDLCALC, TRIG, CHOLHDL, LDLDIRECT in the last 72 hours. Thyroid Function Tests: No results for input(s): TSH, T4TOTAL, FREET4, T3FREE, THYROIDAB in the last 72 hours. Anemia Panel: No results for input(s): VITAMINB12, FOLATE, FERRITIN, TIBC, IRON, RETICCTPCT in the last 72 hours. Sepsis Labs: Covid negative  Recent Results (from the past 240 hour(s))  Resp Panel by RT-PCR (Flu A&B, Covid) Nasopharyngeal Swab     Status: None   Collection Time: 06/30/20 10:34 AM   Specimen: Nasopharyngeal Swab; Nasopharyngeal(NP) swabs in vial transport medium  Result Value Ref Range Status   SARS Coronavirus 2 by RT PCR NEGATIVE NEGATIVE Final    Comment: (NOTE) SARS-CoV-2 target nucleic acids are NOT DETECTED.  The SARS-CoV-2  RNA is generally detectable in upper respiratory specimens during the acute phase of infection. The lowest concentration of SARS-CoV-2 viral copies this assay can detect is 138 copies/mL. A negative result does not preclude SARS-Cov-2 infection and should not be used as the sole basis for treatment or other patient management decisions. A negative result may occur with  improper specimen collection/handling, submission of specimen other than nasopharyngeal swab, presence of viral mutation(s) within the areas targeted by this assay, and inadequate number of viral copies(<138 copies/mL). A negative result must be combined with clinical observations, patient history, and epidemiological information. The expected result is Negative.  Fact Sheet for Patients:  EntrepreneurPulse.com.au  Fact Sheet for Healthcare Providers:  IncredibleEmployment.be  This test is no t yet approved or cleared by the [place] FDA and  has been authorized for detection and/or diagnosis of SARS-CoV-2 by FDA under an Emergency  Use Authorization (EUA). This EUA will remain  in effect (meaning this test can be used) for the duration of the COVID-19 declaration under Section 564(b)(1) of the Act, 21 U.S.C.section 360bbb-3(b)(1), unless the authorization is terminated  or revoked sooner.       Influenza A by PCR NEGATIVE NEGATIVE Final   Influenza B by PCR NEGATIVE NEGATIVE Final    Comment: (NOTE) The Xpert Xpress SARS-CoV-2/FLU/RSV plus assay is intended as an aid in the diagnosis of influenza from Nasopharyngeal swab specimens and should not be used as a sole basis for treatment. Nasal washings and aspirates are unacceptable for Xpert Xpress SARS-CoV-2/FLU/RSV testing.  Fact Sheet for Patients: EntrepreneurPulse.com.au  Fact Sheet for Healthcare Providers: IncredibleEmployment.be  This test is not yet approved or cleared by the [place] FDA and has been authorized for detection and/or diagnosis of SARS-CoV-2 by FDA under an Emergency Use Authorization (EUA). This EUA will remain in effect (meaning this test can be used) for the duration of the COVID-19 declaration under Section 564(b)(1) of the Act, 21 U.S.C. section 360bbb-3(b)(1), unless the authorization is terminated or revoked.  Performed at [facility], [address] [place], [place] [address]            Radiological Exams on Admission: Personally reviewed: No acute processes CT Head Wo Contrast  Result Date: 06/30/2020 CLINICAL DATA:  Mental status changes EXAM: CT HEAD WITHOUT CONTRAST TECHNIQUE: Contiguous axial images were obtained from the base of the skull through the vertex without intravenous contrast. COMPARISON:  06/01/2020 FINDINGS: Brain: There is atrophy and chronic small vessel disease changes. No acute intracranial abnormality. Specifically, no hemorrhage, hydrocephalus, mass lesion, acute infarction, or significant intracranial injury. Vascular: No hyperdense vessel or  unexpected calcification. Skull: No acute calvarial abnormality. Sinuses/Orbits: No acute findings Other: None IMPRESSION: Atrophy, chronic microvascular disease. No acute intracranial abnormality. Electronically Signed   By: Rolm Baptise M.D.   On: 06/30/2020 12:38   DG Chest Port 1 View  Result Date: 06/30/2020 CLINICAL DATA:  Hand pain after injury, confusion EXAM: PORTABLE CHEST 1 VIEW COMPARISON:  06/01/2020 chest radiograph. FINDINGS: Stable cardiomediastinal silhouette with normal heart size. No pneumothorax. No pleural effusion. Lungs appear clear, with no acute consolidative airspace disease and no pulmonary edema. IMPRESSION: No active disease. Electronically Signed   By: Ilona Sorrel M.D.   On: 06/30/2020 09:13   DG Hand Complete Right  Result Date: 06/30/2020 CLINICAL DATA:  Right hand pain, possible injury EXAM: RIGHT HAND - COMPLETE 3+ VIEW COMPARISON:  None. FINDINGS: There is no evidence of fracture or dislocation. No evidence  of inflammatory arthropathy. Circumscribed lucencies in the heads of the third and fourth metacarpal demonstrate a thin peripheral sclerotic margin. These most likely represent subchondral geodes. Soft tissues are unremarkable. IMPRESSION: Negative. Electronically Signed   By: Jacqulynn Cadet M.D.   On: 06/30/2020 09:23    EKG: Independently reviewed.  Sinus rhythm, few PVCs       Assessment/Plan   1.  Acute metabolic encephalopathy -At baseline patient has some dementia felt to be vascular related.  But is extremely off of his baseline -CT head on initial presentation reviewed, does not appear to have any acute process going on -Neurology consulted -Specific etiology unknown, could be related to worsening of his dementia, medication induced, delirium -Fall precautions -PT and OT consulted -Hold home galantamine and lexapro as it could be contributing symptoms -Regulate sleep-wake cycle, avoid waking up during the night to let him sleep -MRI of  brain pending -Avoid antihistamines  2.  Fever, unknown origin -Patient presents with fever, 38.1 C, of unknown origin -Chest x-ray reviewed, no signs of pneumonia -Urinalysis unimpressive for UTI -Blood and urine cultures obtained -Tylenol as needed -Possibly related to medications -IV fluids if unable to tolerate p.o. intake -We will continue antibiotics empirically (only with cefepime, not vanc or flagyl) but likely can stop if no infectious etiology seen  3.  Vascular dementia -Neurology consulted -Hold home galantamine as it could be contributing symptoms -Regulate sleep-wake cycle -Fall precautions -PT and OT consulted -Continue home on aspirin and Crestor  4.  Essential hypertension -We will hold home blood pressure meds due to possible TIA, permissive hypertension    DVT prophylaxis: Lovenox Code Status: Full Family Communication: Daughter and wife bedside Disposition Plan: Anticipate discharge home Consults called: Neurology Admission status: Observation   At the point of initial evaluation, it is my clinical opinion that admission for OBSERVATION is reasonable and necessary because the patient's presenting complaints in the context of their chronic conditions represent sufficient risk of deterioration or significant morbidity to constitute reasonable grounds for close observation in the hospital setting, but that the patient may be medically stable for discharge from the hospital within 24 to 48 hours.    Medical decision making: Patient seen at 8:28 PM on 06/30/2020.  The patient was discussed with daughter and wife at bedside.  What exists of the patient's chart was reviewed in depth and summarized above.  Clinical condition: Watcher.        Doran Heater [facility] Please page though Chase or Epic secure chat:  For password, contact charge nurse

## 2020-06-30 NOTE — ED Notes (Signed)
He has become quite anxious; nearly frantic. Order rec'd. For P.O. Ativan. His wife remains with him.

## 2020-06-30 NOTE — Consult Note (Addendum)
Neurology Consultation Note  Consult Requested by: Dr. Luna Fuse  Reason for Consult: AMS  Consult Date: 06/30/20   The history was obtained from the wife and daughter.  During history and examination, all items were able to obtain unless otherwise noted.  History of Present Illness:  Patrick Macias is a 80 y.o. Caucasian male with PMH of HTN, HLD, recent cognitive decline and recent admission for AMS admitted again for AMS, rash, fever, and whole body ache.   Per wife and daughter, pt was here one month ago for confusion, AMS, speech difficulty and off balance. Had stroke work up which was negative. However, concerning for TIA vs. MCI. Discharged with ASA and plavix. And also started recently for galantamine. Per family, pt has been doing better over time but for the last one week, he developed rash and hives mainly at the back, he was given claritin at home. For the last 2 days, he also started to have fever and whole body ache, not getting out of bed, today developed confusion, talking out of his head and hallucination. He was sent to ER for evaluation.   In ER, he had fever of 100.5, received tylenol. Cre 1.13, CRP 12.6 with elevated LA. WBC 7.1. UA neg. Blood and urine culture pending. CXR and CT head neg. MRI pending.    Per wife, patient had a gout 40+ years ago.   Past Medical History:  Diagnosis Date  . Anxiety   . BPH (benign prostatic hyperplasia)   . Cerumen impaction   . Dyslipidemia   . H/O lithotripsy   . Hearing decreased, bilateral   . Hx of colonic polyps   . Hypertension   . IFG (impaired fasting glucose)   . Macrocytosis   . Nephrolithiasis   . Vitamin D deficiency     Past Surgical History:  Procedure Laterality Date  . CATARACT EXTRACTION, BILATERAL    . PROSTATECTOMY      Family History  Problem Relation Age of Onset  . Sudden death Neg Hx   . Hypertension Neg Hx   . Hyperlipidemia Neg Hx   . Diabetes Neg Hx   . Heart attack Neg Hx     Social  History:  reports that he has never smoked. He has never used smokeless tobacco. He reports previous alcohol use. He reports that he does not use drugs.  Allergies: No Known Allergies  No current facility-administered medications on file prior to encounter.   Current Outpatient Medications on File Prior to Encounter  Medication Sig Dispense Refill  . aspirin EC 81 MG tablet Take 1 tablet (81 mg total) by mouth daily. 90 tablet 3  . Cholecalciferol (VITAMIN D-3) 125 MCG (5000 UT) TABS Take 1 tablet by mouth 3 (three) times a week.     . escitalopram (LEXAPRO) 20 MG tablet Take 20 mg by mouth daily.    Marland Kitchen galantamine (RAZADYNE ER) 16 MG 24 hr capsule Take 16 mg by mouth daily with breakfast.    . lisinopril-hydrochlorothiazide (ZESTORETIC) 20-25 MG tablet Take 1 tablet by mouth daily.    . rosuvastatin (CRESTOR) 20 MG tablet Take 20 mg by mouth at bedtime.      Review of Systems: A full ROS was attempted today and was not able to be performed given AMS.  Physical Examination: Temp:  [99.7 F (37.6 C)-100.5 F (38.1 C)] 100 F (37.8 C) (01/01 2116) Pulse Rate:  [66-98] 92 (01/01 1904) Resp:  [14-33] 18 (01/01 1904) BP: (108-151)/(55-121) 144/63 (01/01  1904) SpO2:  [93 %-100 %] 97 % (01/01 1904) Weight:  [77.1 kg] 77.1 kg (01/01 0830)  General - well nourished, well developed, in no apparent distress.    Ophthalmologic - fundi not visualized due to noncooperation.    Cardiovascular - regular rhythm and rate  Neuro - awake alert but decreased concentration and attention, easily distracted. Orientated to self, age, people and month but not to year or place. No aphasia, able to name 3/4, able to repeat simple sentence but frequent perseveration with previous answers. Frequently with spontaneous speech irrelevant. Able to follow most simple commands. Blinking to visual threat bilaterally, no gaze palsy. Facial symmetrical and tongue midline. B/l UE seems to have equal strength proximal and  distally, however guarding position due to b/l shoulder pain. He also has left wrist pain on palpation. B/l LE proximal at least 3/5 but also in guarding position due to b/l hip pain. No muscle rigidity but more guarding positions. Sensation and coordination not quite cooperative and also due to pain. Gait not tested.   Data Reviewed: CT Angio Head W or Wo Contrast  Result Date: 06/01/2020 CLINICAL DATA:  Neuro deficit, acute, stroke suspected EXAM: CT ANGIOGRAPHY HEAD AND NECK TECHNIQUE: Multidetector CT imaging of the head and neck was performed using the standard protocol during bolus administration of intravenous contrast. Multiplanar CT image reconstructions and MIPs were obtained to evaluate the vascular anatomy. Carotid stenosis measurements (when applicable) are obtained utilizing NASCET criteria, using the distal internal carotid diameter as the denominator. CONTRAST:  178mL OMNIPAQUE IOHEXOL 350 MG/ML SOLN COMPARISON:  06/01/2020 head CT.  03/20/2020 MRI head. FINDINGS: CTA NECK FINDINGS Aortic arch: Standard branching. Imaged portion shows no evidence of aneurysm or dissection. No significant stenosis of the major arch vessel origins. Minimal aortic arch atherosclerotic calcifications. Right carotid system: No evidence of dissection, stenosis (50% or greater) or occlusion. Proximal to mid ICA calcified atheromatous plaque with 30% luminal narrowing. Left carotid system: No evidence of dissection, stenosis (50% or greater) or occlusion. Carotid bulb atherosclerotic calcifications with 20% luminal narrowing. Vertebral arteries: Dominant left vertebral artery. No evidence of dissection, stenosis (50% or greater) or occlusion. Skeleton: No acute finding.  Multilevel spondylosis. Other neck: No adenopathy. 1.3 cm enhancing nodule dorsal to the left thyroid (515:154) with an associated polar vessel may reflect a parathyroid adenoma versus exophytic thyroid nodule. Upper chest: Dependent atelectasis. Review  of the MIP images confirms the above findings CTA HEAD FINDINGS Anterior circulation: Patent ICAs. Bilateral carotid siphon atherosclerotic calcifications. Patent ACAs and MCAs. No significant stenosis, proximal occlusion, aneurysm, or vascular malformation. Posterior circulation: Patent V4 segments and PICA. Patent basilar artery. Patent superior cerebellar and posterior cerebral arteries. Fetal origin of the left PCA with mild narrowing of the P2 segment. No proximal occlusion, aneurysm, or vascular malformation. Venous sinuses: As permitted by contrast timing, patent. Anatomic variants: Please see above. Review of the MIP images confirms the above findings IMPRESSION: 1. No large vessel occlusion, hemodynamically significant stenosis, or evidence of dissection. 2. 20-30% luminal narrowing of the proximal right ICA and left carotid bulb 3. Mild narrowing of the left P2 segment. 4. 1.3 cm enhancing nodule dorsal to the left thyroid lobe may reflect a parathyroid adenoma versus exophytic thyroid nodule. Consider outpatient thyroid ultrasound and correlation with endocrine labs for further evaluation. Electronically Signed   By: Primitivo Gauze M.D.   On: 06/01/2020 13:37   CT Head Wo Contrast  Result Date: 06/30/2020 CLINICAL DATA:  Mental status changes EXAM:  CT HEAD WITHOUT CONTRAST TECHNIQUE: Contiguous axial images were obtained from the base of the skull through the vertex without intravenous contrast. COMPARISON:  06/01/2020 FINDINGS: Brain: There is atrophy and chronic small vessel disease changes. No acute intracranial abnormality. Specifically, no hemorrhage, hydrocephalus, mass lesion, acute infarction, or significant intracranial injury. Vascular: No hyperdense vessel or unexpected calcification. Skull: No acute calvarial abnormality. Sinuses/Orbits: No acute findings Other: None IMPRESSION: Atrophy, chronic microvascular disease. No acute intracranial abnormality. Electronically Signed   By: Rolm Baptise M.D.   On: 06/30/2020 12:38   CT HEAD WO CONTRAST  Result Date: 06/01/2020 CLINICAL DATA:  Headache, confusion. EXAM: CT HEAD WITHOUT CONTRAST TECHNIQUE: Contiguous axial images were obtained from the base of the skull through the vertex without intravenous contrast. COMPARISON:  March 20, 2020. FINDINGS: Brain: Mild chronic ischemic white matter disease is noted. No mass effect or midline shift is noted. Ventricular size is within normal limits. There is no evidence of mass lesion, hemorrhage or acute infarction. Vascular: No hyperdense vessel or unexpected calcification. Skull: Normal. Negative for fracture or focal lesion. Sinuses/Orbits: No acute finding. Other: None. IMPRESSION: Mild chronic ischemic white matter disease. No acute intracranial abnormality seen. Electronically Signed   By: Marijo Conception M.D.   On: 06/01/2020 10:23   CT Angio Neck W and/or Wo Contrast  Result Date: 06/01/2020 CLINICAL DATA:  Neuro deficit, acute, stroke suspected EXAM: CT ANGIOGRAPHY HEAD AND NECK TECHNIQUE: Multidetector CT imaging of the head and neck was performed using the standard protocol during bolus administration of intravenous contrast. Multiplanar CT image reconstructions and MIPs were obtained to evaluate the vascular anatomy. Carotid stenosis measurements (when applicable) are obtained utilizing NASCET criteria, using the distal internal carotid diameter as the denominator. CONTRAST:  150mL OMNIPAQUE IOHEXOL 350 MG/ML SOLN COMPARISON:  06/01/2020 head CT.  03/20/2020 MRI head. FINDINGS: CTA NECK FINDINGS Aortic arch: Standard branching. Imaged portion shows no evidence of aneurysm or dissection. No significant stenosis of the major arch vessel origins. Minimal aortic arch atherosclerotic calcifications. Right carotid system: No evidence of dissection, stenosis (50% or greater) or occlusion. Proximal to mid ICA calcified atheromatous plaque with 30% luminal narrowing. Left carotid system: No  evidence of dissection, stenosis (50% or greater) or occlusion. Carotid bulb atherosclerotic calcifications with 20% luminal narrowing. Vertebral arteries: Dominant left vertebral artery. No evidence of dissection, stenosis (50% or greater) or occlusion. Skeleton: No acute finding.  Multilevel spondylosis. Other neck: No adenopathy. 1.3 cm enhancing nodule dorsal to the left thyroid (515:154) with an associated polar vessel may reflect a parathyroid adenoma versus exophytic thyroid nodule. Upper chest: Dependent atelectasis. Review of the MIP images confirms the above findings CTA HEAD FINDINGS Anterior circulation: Patent ICAs. Bilateral carotid siphon atherosclerotic calcifications. Patent ACAs and MCAs. No significant stenosis, proximal occlusion, aneurysm, or vascular malformation. Posterior circulation: Patent V4 segments and PICA. Patent basilar artery. Patent superior cerebellar and posterior cerebral arteries. Fetal origin of the left PCA with mild narrowing of the P2 segment. No proximal occlusion, aneurysm, or vascular malformation. Venous sinuses: As permitted by contrast timing, patent. Anatomic variants: Please see above. Review of the MIP images confirms the above findings IMPRESSION: 1. No large vessel occlusion, hemodynamically significant stenosis, or evidence of dissection. 2. 20-30% luminal narrowing of the proximal right ICA and left carotid bulb 3. Mild narrowing of the left P2 segment. 4. 1.3 cm enhancing nodule dorsal to the left thyroid lobe may reflect a parathyroid adenoma versus exophytic thyroid nodule. Consider outpatient thyroid ultrasound  and correlation with endocrine labs for further evaluation. Electronically Signed   By: Stana Bunting M.D.   On: 06/01/2020 13:37   MR BRAIN WO CONTRAST  Result Date: 06/02/2020 CLINICAL DATA:  Stroke follow-up. Dementia, hypertension, hyperlipidemia. Confusion and speech difficulty today. EXAM: MRI HEAD WITHOUT CONTRAST TECHNIQUE:  Multiplanar, multiecho pulse sequences of the brain and surrounding structures were obtained without intravenous contrast. COMPARISON:  MRI head 03/20/2020 FINDINGS: Brain: Negative for acute infarct. Moderate to advanced chronic white matter changes. Patchy periventricular deep white matter hyperintensity bilaterally. Small hyperintensity right paramedian pons. Negative for hemorrhage or mass. Cerebral volume normal for age. Negative for hydrocephalus. Vascular: Normal arterial flow voids Skull and upper cervical spine: No focal skeletal abnormality. Sinuses/Orbits: Mild mucosal edema paranasal sinuses. Bilateral cataract extraction. Other: None IMPRESSION: Negative for acute infarct. Moderate to advanced chronic white matter changes most consistent with chronic microvascular ischemia. Electronically Signed   By: Marlan Palau M.D.   On: 06/02/2020 06:17   DG Chest Port 1 View  Result Date: 06/30/2020 CLINICAL DATA:  Hand pain after injury, confusion EXAM: PORTABLE CHEST 1 VIEW COMPARISON:  06/01/2020 chest radiograph. FINDINGS: Stable cardiomediastinal silhouette with normal heart size. No pneumothorax. No pleural effusion. Lungs appear clear, with no acute consolidative airspace disease and no pulmonary edema. IMPRESSION: No active disease. Electronically Signed   By: Delbert Phenix M.D.   On: 06/30/2020 09:13   DG Chest Port 1 View  Result Date: 06/01/2020 CLINICAL DATA:  Altered mental status. EXAM: PORTABLE CHEST 1 VIEW COMPARISON:  March 16, 2008. FINDINGS: The heart size and mediastinal contours are within normal limits. Both lungs are clear. The visualized skeletal structures are unremarkable. IMPRESSION: No active disease. Electronically Signed   By: Lupita Raider M.D.   On: 06/01/2020 10:20   DG Hand Complete Right  Result Date: 06/30/2020 CLINICAL DATA:  Right hand pain, possible injury EXAM: RIGHT HAND - COMPLETE 3+ VIEW COMPARISON:  None. FINDINGS: There is no evidence of fracture or  dislocation. No evidence of inflammatory arthropathy. Circumscribed lucencies in the heads of the third and fourth metacarpal demonstrate a thin peripheral sclerotic margin. These most likely represent subchondral geodes. Soft tissues are unremarkable. IMPRESSION: Negative. Electronically Signed   By: Malachy Moan M.D.   On: 06/30/2020 09:23   ECHOCARDIOGRAM COMPLETE  Result Date: 06/02/2020    ECHOCARDIOGRAM REPORT   Patient Name:   AARSH FRISTOE Wellons Date of Exam: 06/02/2020 Medical Rec #:  [id number]       Height:       71.0 in Accession #:    [id number]      Weight:       170.0 lb Date of Birth:  Dec 15, 1940       BSA:          1.968 m Patient Age:    79 years        BP:           121/64 mmHg Patient Gender: M               HR:           56 bpm. Exam Location:  Inpatient Procedure: 2D Echo, Cardiac Doppler and Color Doppler Indications:    TIA 435.9 / G45.9  History:        Patient has no prior history of Echocardiogram examinations.                 Risk Factors:Hypertension, Dyslipidemia and Non-Smoker.  Sonographer:  Vickie Epley RDCS Referring Phys: [id number] Tina  1. Left ventricular ejection fraction, by estimation, is 60 to 65%. The left ventricle has normal function. The left ventricle has no regional wall motion abnormalities. Left ventricular diastolic parameters were normal.  2. Right ventricular systolic function is normal. The right ventricular size is moderately enlarged. There is normal pulmonary artery systolic pressure. The estimated right ventricular systolic pressure is [id number] mmHg.  3. Right atrial size was mildly dilated.  4. The mitral valve is normal in structure. No evidence of mitral valve regurgitation.  5. The aortic valve is tricuspid. Aortic valve regurgitation is trivial. No aortic stenosis is present.  6. The inferior vena cava is dilated in size with >50% respiratory variability, suggesting right atrial pressure of 8 mmHg. FINDINGS  Left Ventricle: Left  ventricular ejection fraction, by estimation, is 60 to 65%. The left ventricle has normal function. The left ventricle has no regional wall motion abnormalities. The left ventricular internal cavity size was normal in size. There is  no left ventricular hypertrophy. Left ventricular diastolic parameters were normal. Right Ventricle: The right ventricular size is moderately enlarged. Right vetricular wall thickness was not assessed. Right ventricular systolic function is normal. There is normal pulmonary artery systolic pressure. The tricuspid regurgitant velocity is  2.12 m/s, and with an assumed right atrial pressure of 8 mmHg, the estimated right ventricular systolic pressure is [id number] mmHg. Left Atrium: Left atrial size was normal in size. Right Atrium: Right atrial size was mildly dilated. Pericardium: There is no evidence of pericardial effusion. Mitral Valve: The mitral valve is normal in structure. No evidence of mitral valve regurgitation. Tricuspid Valve: The tricuspid valve is not well visualized. Tricuspid valve regurgitation is trivial. Aortic Valve: The aortic valve is tricuspid. Aortic valve regurgitation is trivial. No aortic stenosis is present. Pulmonic Valve: The pulmonic valve was not well visualized. Pulmonic valve regurgitation is not visualized. Aorta: The aortic root is normal in size and structure. Venous: The inferior vena cava is dilated in size with greater than 50% respiratory variability, suggesting right atrial pressure of 8 mmHg. IAS/Shunts: The interatrial septum was not well visualized.  LEFT VENTRICLE PLAX 2D LVIDd:         3.90 cm      Diastology LVIDs:         3.00 cm      LV e' medial:    5.76 cm/s LV PW:         0.90 cm      LV E/e' medial:  13.8 LV IVS:        0.90 cm      LV e' lateral:   9.41 cm/s LVOT diam:     2.40 cm      LV E/e' lateral: 8.5 LV SV:         105 LV SV Index:   53 LVOT Area:     4.52 cm  LV Volumes (MOD) LV vol d, MOD A2C: 113.0 ml LV vol d, MOD A4C: 130.0 ml  LV vol s, MOD A2C: 41.5 ml LV vol s, MOD A4C: 43.5 ml LV SV MOD A2C:     71.5 ml LV SV MOD A4C:     130.0 ml LV SV MOD BP:      82.6 ml RIGHT VENTRICLE RV S prime:     14.10 cm/s TAPSE (M-mode): 2.7 cm LEFT ATRIUM             Index  RIGHT ATRIUM           Index LA diam:        4.10 cm 2.08 cm/m  RA Area:     22.05 cm LA Vol (A2C):   45.1 ml 22.92 ml/m RA Volume:   76.80 ml  39.03 ml/m LA Vol (A4C):   46.7 ml 23.73 ml/m LA Biplane Vol: 49.4 ml 25.10 ml/m  AORTIC VALVE LVOT Vmax:   97.90 cm/s LVOT Vmean:  62.100 cm/s LVOT VTI:    0.231 m  AORTA Ao Root diam: 3.80 cm MITRAL VALVE               TRICUSPID VALVE MV Area (PHT): 2.48 cm    TR Peak grad:   18.0 mmHg MV Decel Time: 306 msec    TR Vmax:        212.00 cm/s MV E velocity: 79.70 cm/s MV A velocity: 90.40 cm/s  SHUNTS MV E/A ratio:  0.88        Systemic VTI:  0.23 m                            Systemic Diam: 2.40 cm Oswaldo Milian MD Electronically signed by Oswaldo Milian MD Signature Date/Time: 06/02/2020/3:15:25 PM    Final     Assessment: 80 y.o. male with PMH of HTN, HLD, recent cognitive decline and recent admission for AMS admitted again for AMS, rash, fever, and whole body ache.  On exam, patient with disorientation and impaired concentration attention, frequent perseveration and irrelevant speech.  However no significant focal deficit.  Patient no muscle rigidity or significant weakness, but in guarding position in all extremities due to pain from bilateral shoulder, bilateral hips and left wrist.  We will do MRI brain however low suspicion for stroke.  Etiology for patient AMS likely due to encephalopathy from fever, recent allergic reaction and whole-body joint pain.  Agree with infectious work-up and antibiotics per primary team. Will do joint X-ray to rule out fracture or dislocation. Gout is also in differential diagnosis. Will check CK level to rule out serotonin syndrome and NMS, check ammonia level B12 TSH for  encephalopathy work-up.  Low suspicion for serotonin syndrome as patient on Lexapro already for 3 years.  Patient rash may develop from recent Plavix and galantamine, recently Plavix has been off, will recommend hold off galantamine at this time. EEG in am.   Plan: - MRI of the brain with and without contrast - PT consult, OT consult, Speech consult - Continue encephalopathy work-up, check CK, TSH, B12, RPR - Hold off galantamine at this time - Continue infectious work-up and treatment per primary team - management of joint pain per primary team - X-ray to rule out fracture or dislocation - EEG in am - Continue home aspirin - Telemetry monitoring - Frequent neuro checks - will follow with you  Thank you for this consultation and allowing [place] to participate in the care of this patient.  Rosalin Hawking, MD PhD Stroke Neurology 07/01/2020 1:05 AM

## 2020-06-30 NOTE — ED Provider Notes (Signed)
[facility] EMERGENCY DEPARTMENT Provider Note   CSN: [id number] Arrival date & time: 06/30/20  0740     History Chief Complaint  Patient presents with  . Weakness  . Fever    Patrick Macias is a 80 y.o. male.  He is brought in by EMS from his house.  He is somewhat confused and his wife is helping with history.  She said he has had hives over his body for almost a week.  Unclear etiology.  Using allergy medication without much improvement.  Yesterday he injured his right hand when he struck it on something.  Pain radiates up the arm.  Starting last evening he had a fever.  This morning the wife found him in bed and he could not get out.  He was generally weak and achy.  Patient denies any headache chest pain shortness of breath abdominal pain vomiting or urinary symptoms.  Had one episode of diarrhea a few days ago.  Recent stroke.  The history is provided by the patient and the spouse.  Weakness Severity:  Moderate Onset quality:  Gradual Duration:  2 days Timing:  Constant Progression:  Worsening Chronicity:  New Relieved by:  None tried Worsened by:  Activity Ineffective treatments:  None tried Associated symptoms: diarrhea, difficulty walking, fever and myalgias   Associated symptoms: no abdominal pain, no aphasia, no chest pain, no cough, no dizziness, no dysuria, no foul-smelling urine, no frequency, no headaches, no hematochezia, no loss of consciousness, no melena, no nausea, no sensory-motor deficit, no shortness of breath and no vomiting   Fever Associated symptoms: chills, diarrhea, myalgias and rash   Associated symptoms: no chest pain, no cough, no dysuria, no headaches, no nausea, no sore throat and no vomiting        Past Medical History:  Diagnosis Date  . Anxiety   . BPH (benign prostatic hyperplasia)   . Cerumen impaction   . Dyslipidemia   . H/O lithotripsy   . Hearing decreased, bilateral   . Hx of colonic polyps   . Hypertension   . IFG  (impaired fasting glucose)   . Macrocytosis   . Nephrolithiasis   . Vitamin D deficiency     Patient Active Problem List   Diagnosis Date Noted  . Cognitive impairment 06/04/2020  . TIA (transient ischemic attack) 06/01/2020  . 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 Anxiety 06/01/2020  . HTN (hypertension) 06/01/2020  . Right shoulder pain 08/10/2013    Past Surgical History:  Procedure Laterality Date  . CATARACT EXTRACTION, BILATERAL    . PROSTATECTOMY         Family History  Problem Relation Age of Onset  . Sudden death Neg Hx   . Hypertension Neg Hx   . Hyperlipidemia Neg Hx   . Diabetes Neg Hx   . Heart attack Neg Hx     Social History   Tobacco Use  . Smoking status: Never Smoker  . Smokeless tobacco: Never Used  Substance Use Topics  . Alcohol use: Not Currently  . Drug use: Never    Home Medications Prior to Admission medications   Medication Sig Start Date End Date Taking? Authorizing Provider  aspirin EC 81 MG tablet Take 1 tablet (81 mg total) by mouth daily. 05/11/19   Belva Crome, MD  Cholecalciferol (VITAMIN D-3) 125 MCG (5000 UT) TABS Take 1 tablet by mouth 3 (three) times a week.     [provider]  escitalopram Loma Sousa)  20 MG tablet Take 20 mg by mouth daily.    [provider]  galantamine (RAZADYNE ER) 16 MG 24 hr capsule Take 16 mg by mouth daily with breakfast.    [provider]  lisinopril-hydrochlorothiazide (ZESTORETIC) 20-25 MG tablet Take 1 tablet by mouth daily. 04/08/19   [provider]  rosuvastatin (CRESTOR) 20 MG tablet Take 20 mg by mouth at bedtime. 05/03/19   [provider]    Allergies    Patient has no known allergies.  Review of Systems   Review of Systems  Constitutional: Positive for chills, fatigue and fever.  HENT: Negative for sore throat.   Eyes: Negative for visual disturbance.  Respiratory: Negative for cough and shortness of breath.    Cardiovascular: Negative for chest pain.  Gastrointestinal: Positive for diarrhea. Negative for abdominal pain, hematochezia, melena, nausea and vomiting.  Genitourinary: Negative for dysuria and frequency.  Musculoskeletal: Positive for gait problem and myalgias.  Skin: Positive for rash.  Neurological: Positive for weakness. Negative for dizziness, loss of consciousness and headaches.    Physical Exam Updated Vital Signs BP 139/62 (BP Location: Right Arm)   Pulse 87   Temp (!) 100.5 F (38.1 C) (Rectal)   Resp (!) 23   SpO2 96%   Physical Exam Vitals and nursing note reviewed.  Constitutional:      Appearance: Normal appearance. He is well-developed and well-nourished.  HENT:     Head: Normocephalic and atraumatic.  Eyes:     Conjunctiva/sclera: Conjunctivae normal.  Cardiovascular:     Rate and Rhythm: Normal rate and regular rhythm.     Pulses: Normal pulses.     Heart sounds: No murmur heard.   Pulmonary:     Effort: Pulmonary effort is normal. No respiratory distress.     Breath sounds: Normal breath sounds.  Abdominal:     Palpations: Abdomen is soft.     Tenderness: There is no abdominal tenderness.  Musculoskeletal:        General: Tenderness present. No deformity, signs of injury or edema.     Cervical back: Neck supple.     Comments: He has some tenderness in his right palm.  No obvious deformity wrist elbow shoulder.  Normal pulses and cap refill.  Normal sensation.  Skin:    General: Skin is warm and dry.     Capillary Refill: Capillary refill takes less than 2 seconds.  Neurological:     General: No focal deficit present.     Mental Status: He is alert. He is disoriented.     Cranial Nerves: No cranial nerve deficit.     Sensory: No sensory deficit.     Motor: No weakness.  Psychiatric:        Mood and Affect: Mood and affect normal.     ED Results / Procedures / Treatments   Labs (all labs ordered are listed, but only abnormal results are  displayed) Labs Reviewed  LACTIC ACID, PLASMA - Abnormal; Notable for the following components:      Result Value   Lactic Acid, Venous 2.1 (*)    All other components within normal limits  COMPREHENSIVE METABOLIC PANEL - Abnormal; Notable for the following components:   Sodium 134 (*)    Glucose, Bld 203 (*)    BUN 27 (*)    Calcium 8.6 (*)    Alkaline Phosphatase 29 (*)    Total Bilirubin 1.5 (*)    All other components within normal limits  CBC WITH  DIFFERENTIAL/PLATELET - Abnormal; Notable for the following components:   RBC 3.18 (*)    Hemoglobin 10.9 (*)    HCT 31.7 (*)    MCH 34.3 (*)    All other components within normal limits  URINALYSIS, ROUTINE W REFLEX MICROSCOPIC - Abnormal; Notable for the following components:   Hgb urine dipstick SMALL (*)    Ketones, ur 15 (*)    Protein, ur 30 (*)    All other components within normal limits  URINALYSIS, MICROSCOPIC (REFLEX) - Abnormal; Notable for the following components:   Bacteria, UA MANY (*)    All other components within normal limits  C-REACTIVE PROTEIN - Abnormal; Notable for the following components:   CRP 12.6 (*)    All other components within normal limits  SEDIMENTATION RATE - Abnormal; Notable for the following components:   Sed Rate 35 (*)    All other components within normal limits  CBG MONITORING, ED - Abnormal; Notable for the following components:   Glucose-Capillary 114 (*)    All other components within normal limits  TROPONIN I (HIGH SENSITIVITY) - Abnormal; Notable for the following components:   Troponin I (High Sensitivity) 18 (*)    All other components within normal limits  TROPONIN I (HIGH SENSITIVITY) - Abnormal; Notable for the following components:   Troponin I (High Sensitivity) 21 (*)    All other components within normal limits  RESP PANEL BY RT-PCR (FLU A&B, COVID) ARPGX2  URINE CULTURE  CULTURE, BLOOD (ROUTINE X 2)  CULTURE, BLOOD (ROUTINE X 2)  LACTIC ACID, PLASMA  PROTIME-INR   APTT    EKG EKG Interpretation  Date/Time:  Saturday June 30 2020 07:58:02 EST Ventricular Rate:  89 PR Interval:    QRS Duration: 95 QT Interval:  355 QTC Calculation: 432 R Axis:   0 Text Interpretation: Sinus rhythm Multiple ventricular premature complexes increased rate and ectopy from prior 12/21 Confirmed by Meridee Score [id number]) on 06/30/2020 8:26:05 AM   Radiology CT Head Wo Contrast  Result Date: 06/30/2020 CLINICAL DATA:  Mental status changes EXAM: CT HEAD WITHOUT CONTRAST TECHNIQUE: Contiguous axial images were obtained from the base of the skull through the vertex without intravenous contrast. COMPARISON:  06/01/2020 FINDINGS: Brain: There is atrophy and chronic small vessel disease changes. No acute intracranial abnormality. Specifically, no hemorrhage, hydrocephalus, mass lesion, acute infarction, or significant intracranial injury. Vascular: No hyperdense vessel or unexpected calcification. Skull: No acute calvarial abnormality. Sinuses/Orbits: No acute findings Other: None IMPRESSION: Atrophy, chronic microvascular disease. No acute intracranial abnormality. Electronically Signed   By: Charlett Nose M.D.   On: 06/30/2020 12:38   DG Chest Port 1 View  Result Date: 06/30/2020 CLINICAL DATA:  Hand pain after injury, confusion EXAM: PORTABLE CHEST 1 VIEW COMPARISON:  06/01/2020 chest radiograph. FINDINGS: Stable cardiomediastinal silhouette with normal heart size. No pneumothorax. No pleural effusion. Lungs appear clear, with no acute consolidative airspace disease and no pulmonary edema. IMPRESSION: No active disease. Electronically Signed   By: Delbert Phenix M.D.   On: 06/30/2020 09:13   DG Hand Complete Right  Result Date: 06/30/2020 CLINICAL DATA:  Right hand pain, possible injury EXAM: RIGHT HAND - COMPLETE 3+ VIEW COMPARISON:  None. FINDINGS: There is no evidence of fracture or dislocation. No evidence of inflammatory arthropathy. Circumscribed lucencies in the heads of  the third and fourth metacarpal demonstrate a thin peripheral sclerotic margin. These most likely represent subchondral geodes. Soft tissues are unremarkable. IMPRESSION: Negative. Electronically Signed   By: Vilma Prader  Laurence Ferrari M.D.   On: 06/30/2020 09:23    Procedures Procedures (including critical care time)  Medications Ordered in ED Medications  lactated ringers infusion ( Intravenous New Bag/Given 06/30/20 1203)  metroNIDAZOLE (FLAGYL) tablet 500 mg (500 mg Oral Given 06/30/20 1205)  ceFEPIme (MAXIPIME) 2 g in sodium chloride 0.9 % 100 mL IVPB (has no administration in time range)  vancomycin (VANCOREADY) IVPB 750 mg/150 mL (has no administration in time range)  escitalopram (LEXAPRO) tablet 20 mg (20 mg Oral Given 06/30/20 1246)  galantamine (RAZADYNE ER) 24 hr capsule 16 mg (has no administration in time range)  rosuvastatin (CRESTOR) tablet 20 mg (has no administration in time range)  aspirin EC tablet 81 mg (81 mg Oral Given 06/30/20 1247)  lisinopril (ZESTRIL) tablet 20 mg (20 mg Oral Given 06/30/20 1247)    And  hydrochlorothiazide (HYDRODIURIL) tablet 25 mg (25 mg Oral Given 06/30/20 1246)  LORazepam (ATIVAN) injection 0.5 mg (has no administration in time range)  LORazepam (ATIVAN) tablet 0.5 mg (0.5 mg Oral Given 06/30/20 1522)  lactated ringers bolus 1,000 mL (0 mLs Intravenous Stopped 06/30/20 1025)  ceFEPIme (MAXIPIME) 2 g in sodium chloride 0.9 % 100 mL IVPB (0 g Intravenous Stopped 06/30/20 1230)  vancomycin (VANCOCIN) IVPB 1000 mg/200 mL premix ( Intravenous Stopped 06/30/20 1337)    ED Course  I have reviewed the triage vital signs and the nursing notes.  Pertinent labs & imaging results that were available during my care of the patient were reviewed by me and considered in my medical decision making (see chart for details).  Clinical Course as of 06/30/20 1706  Sat Jun 30, 2020  1204 Discussed with Dr. Tamala Julian from [facility] hospitalist who will put the patient in for a bed at [facility]. [MB]    Clinical Course User Index [MB] Hayden Rasmussen, MD   MDM Rules/Calculators/A&P                         This patient complains of fever generalized weakness pain in his arms urticaria confusion; this involves an extensive number of treatment Options and is a complaint that carries with it a high risk of complications and Morbidity. The differential includes sepsis, Sirs, pneumonia, UTI, Covid, stroke, TIA, peripheral nerve  I ordered, reviewed and interpreted labs, which included CBC with normal white count, hemoglobin down from baseline, chemistries with elevated glucose elevated BUN, Covid testing negative, urinalysis without signs of obvious infection, lactate elevated but cleared, troponins elevated will need to be trended I ordered medication to biotics for possible sepsis I ordered imaging studies which included chest x-ray, right hand x-ray, head CT and I independently    visualized and interpreted imaging which showed no acute findings Additional history obtained from patient's wife Previous records obtained and reviewed in epic including recent admission last month for confusion I consulted Dr. Tamala Julian [facility] and discussed lab and imaging findings  Critical Interventions: None  After the interventions stated above, I reevaluated the patient and found patient still to be somewhat confused and generally weak although now denies any pain in his arms. Am hesitant to send him home as this may be early signs of a septic episode over a CNS event. Will admit and have team follow cultures. Patient and wife agreeable to plan.  Patrick Macias was evaluated in Emergency Department on 06/30/2020 for the symptoms described in the history of present illness. He was evaluated in the context of the  global COVID-19 pandemic, which necessitated consideration that the patient might be at risk for infection with the SARS-CoV-2 virus that causes COVID-19. Institutional protocols  and algorithms that pertain to the evaluation of patients at risk for COVID-19 are in a state of rapid change based on information released by regulatory bodies including the CDC and federal and state organizations. These policies and algorithms were followed during the patient's care in the ED.  Final Clinical Impression(s) / ED Diagnoses Final diagnoses:  Generalized weakness  SIRS (systemic inflammatory response syndrome) (HCC)  Pain of right hand  Urticaria    Rx / DC Orders ED Discharge Orders    None       Hayden Rasmussen, MD 06/30/20 1712

## 2020-06-30 NOTE — ED Triage Notes (Signed)
Pt. Is awake alert and mildly confused. EMS were told by pt's famiily that pt. Has been "achy" and they were concerned e/t pt. inabilitly to get out of bed this morning. They further stated pt. Had had t.i.a. less than a month ago and he has just finished anticoagulant therapy. Pt. Is breathing normally an is in no distress.

## 2020-06-30 NOTE — ED Notes (Signed)
[facility] leaves our facility at this time.

## 2020-06-30 NOTE — Progress Notes (Signed)
Pharmacy Antibiotic Note  Patrick Macias is a 80 y.o. male admitted on 06/30/2020 with unknown source.  Pharmacy has been consulted for Vanc and cefepime dosing. CrCL 58 ml/min  Plan: Cefepime 2 gm IV q12hr Vanc 1 gram IV x 1, then 750 mg IV q12hr Monitor renal function, clinical status, C&S and vanc levels as needed  Height: 5\' 11"  (180.3 cm) Weight: 77.1 kg (170 lb) IBW/kg (Calculated) : 75.3  Temp (24hrs), Avg:100.1 F (37.8 C), Min:99.7 F (37.6 C), Max:100.5 F (38.1 C)  Recent Labs  Lab 06/30/20 0831 06/30/20 1035  WBC 7.1  --   CREATININE 1.13  --   LATICACIDVEN 2.1* 1.2    Estimated Creatinine Clearance: 58.3 mL/min (by C-G formula based on SCr of 1.13 mg/dL).    No Known Allergies  Antimicrobials this admission: Cefepime 1/1 >>  Vanc 1/1 >>   Thank you for allowing pharmacy to be a part of this patient's care.  08/28/20, PharmD, [facility] Clinical Pharmacist Please see AMION for all Pharmacists' Contact Phone Numbers 06/30/2020, 11:50 AM

## 2020-06-30 NOTE — Progress Notes (Signed)
Spoke to RN and she states the MD wants the patient to get some rest and have their MRI in the am. I let the RN know we will try to get this patient 07/01/20 @6a .

## 2020-06-30 NOTE — ED Notes (Signed)
Also it is to be noted that EMS gave 1 gram of Tylenol en route to hospital.

## 2020-06-30 NOTE — ED Notes (Signed)
Dr. Charm Barges has just spoken with pt. And his wife at length about our findings. Pt. Remains in no distress.

## 2020-06-30 NOTE — ED Notes (Signed)
Lactic 2.1 results given to ED MD and Tim RN

## 2020-07-01 ENCOUNTER — Inpatient Hospital Stay (HOSPITAL_COMMUNITY): Payer: Medicare HMO

## 2020-07-01 ENCOUNTER — Observation Stay (HOSPITAL_COMMUNITY): Payer: Medicare HMO

## 2020-07-01 DIAGNOSIS — R251 Tremor, unspecified: Secondary | ICD-10-CM | POA: Diagnosis present

## 2020-07-01 DIAGNOSIS — M25511 Pain in right shoulder: Secondary | ICD-10-CM | POA: Diagnosis not present

## 2020-07-01 DIAGNOSIS — R4182 Altered mental status, unspecified: Secondary | ICD-10-CM

## 2020-07-01 DIAGNOSIS — I10 Essential (primary) hypertension: Secondary | ICD-10-CM | POA: Diagnosis not present

## 2020-07-01 DIAGNOSIS — F0150 Vascular dementia without behavioral disturbance: Secondary | ICD-10-CM | POA: Diagnosis not present

## 2020-07-01 DIAGNOSIS — M25531 Pain in right wrist: Secondary | ICD-10-CM | POA: Diagnosis not present

## 2020-07-01 DIAGNOSIS — M25552 Pain in left hip: Secondary | ICD-10-CM

## 2020-07-01 DIAGNOSIS — M25561 Pain in right knee: Secondary | ICD-10-CM | POA: Diagnosis not present

## 2020-07-01 DIAGNOSIS — S6991XA Unspecified injury of right wrist, hand and finger(s), initial encounter: Secondary | ICD-10-CM | POA: Diagnosis not present

## 2020-07-01 DIAGNOSIS — Z79899 Other long term (current) drug therapy: Secondary | ICD-10-CM | POA: Diagnosis not present

## 2020-07-01 DIAGNOSIS — M25512 Pain in left shoulder: Secondary | ICD-10-CM

## 2020-07-01 DIAGNOSIS — F419 Anxiety disorder, unspecified: Secondary | ICD-10-CM | POA: Diagnosis present

## 2020-07-01 DIAGNOSIS — E559 Vitamin D deficiency, unspecified: Secondary | ICD-10-CM | POA: Diagnosis present

## 2020-07-01 DIAGNOSIS — F05 Delirium due to known physiological condition: Secondary | ICD-10-CM | POA: Diagnosis not present

## 2020-07-01 DIAGNOSIS — Z781 Physical restraint status: Secondary | ICD-10-CM | POA: Diagnosis not present

## 2020-07-01 DIAGNOSIS — E876 Hypokalemia: Secondary | ICD-10-CM | POA: Diagnosis not present

## 2020-07-01 DIAGNOSIS — G9341 Metabolic encephalopathy: Secondary | ICD-10-CM | POA: Diagnosis not present

## 2020-07-01 DIAGNOSIS — Z9842 Cataract extraction status, left eye: Secondary | ICD-10-CM | POA: Diagnosis not present

## 2020-07-01 DIAGNOSIS — N401 Enlarged prostate with lower urinary tract symptoms: Secondary | ICD-10-CM | POA: Diagnosis not present

## 2020-07-01 DIAGNOSIS — Z7982 Long term (current) use of aspirin: Secondary | ICD-10-CM | POA: Diagnosis not present

## 2020-07-01 DIAGNOSIS — R4189 Other symptoms and signs involving cognitive functions and awareness: Secondary | ICD-10-CM | POA: Diagnosis not present

## 2020-07-01 DIAGNOSIS — R531 Weakness: Secondary | ICD-10-CM | POA: Diagnosis not present

## 2020-07-01 DIAGNOSIS — I517 Cardiomegaly: Secondary | ICD-10-CM | POA: Diagnosis not present

## 2020-07-01 DIAGNOSIS — G459 Transient cerebral ischemic attack, unspecified: Secondary | ICD-10-CM | POA: Diagnosis not present

## 2020-07-01 DIAGNOSIS — Z20822 Contact with and (suspected) exposure to covid-19: Secondary | ICD-10-CM | POA: Diagnosis not present

## 2020-07-01 DIAGNOSIS — R569 Unspecified convulsions: Secondary | ICD-10-CM | POA: Diagnosis not present

## 2020-07-01 DIAGNOSIS — M25551 Pain in right hip: Secondary | ICD-10-CM

## 2020-07-01 DIAGNOSIS — M25532 Pain in left wrist: Secondary | ICD-10-CM | POA: Diagnosis not present

## 2020-07-01 DIAGNOSIS — M109 Gout, unspecified: Secondary | ICD-10-CM | POA: Diagnosis not present

## 2020-07-01 DIAGNOSIS — I6782 Cerebral ischemia: Secondary | ICD-10-CM | POA: Diagnosis not present

## 2020-07-01 DIAGNOSIS — R062 Wheezing: Secondary | ICD-10-CM | POA: Diagnosis not present

## 2020-07-01 DIAGNOSIS — L509 Urticaria, unspecified: Secondary | ICD-10-CM | POA: Diagnosis present

## 2020-07-01 DIAGNOSIS — J9811 Atelectasis: Secondary | ICD-10-CM | POA: Diagnosis not present

## 2020-07-01 DIAGNOSIS — Z9841 Cataract extraction status, right eye: Secondary | ICD-10-CM | POA: Diagnosis not present

## 2020-07-01 DIAGNOSIS — R509 Fever, unspecified: Secondary | ICD-10-CM | POA: Diagnosis not present

## 2020-07-01 DIAGNOSIS — R69 Illness, unspecified: Secondary | ICD-10-CM | POA: Diagnosis not present

## 2020-07-01 DIAGNOSIS — N400 Enlarged prostate without lower urinary tract symptoms: Secondary | ICD-10-CM | POA: Diagnosis not present

## 2020-07-01 DIAGNOSIS — R0989 Other specified symptoms and signs involving the circulatory and respiratory systems: Secondary | ICD-10-CM | POA: Diagnosis not present

## 2020-07-01 DIAGNOSIS — R410 Disorientation, unspecified: Secondary | ICD-10-CM | POA: Diagnosis not present

## 2020-07-01 DIAGNOSIS — R338 Other retention of urine: Secondary | ICD-10-CM | POA: Diagnosis not present

## 2020-07-01 DIAGNOSIS — Z8673 Personal history of transient ischemic attack (TIA), and cerebral infarction without residual deficits: Secondary | ICD-10-CM | POA: Diagnosis not present

## 2020-07-01 DIAGNOSIS — E785 Hyperlipidemia, unspecified: Secondary | ICD-10-CM | POA: Diagnosis not present

## 2020-07-01 DIAGNOSIS — R6510 Systemic inflammatory response syndrome (SIRS) of non-infectious origin without acute organ dysfunction: Secondary | ICD-10-CM | POA: Diagnosis not present

## 2020-07-01 DIAGNOSIS — K5900 Constipation, unspecified: Secondary | ICD-10-CM | POA: Diagnosis not present

## 2020-07-01 LAB — URINE CULTURE: Culture: NO GROWTH

## 2020-07-01 LAB — COMPREHENSIVE METABOLIC PANEL
ALT: 15 U/L (ref 0–44)
AST: 24 U/L (ref 15–41)
Albumin: 2.9 g/dL — ABNORMAL LOW (ref 3.5–5.0)
Alkaline Phosphatase: 26 U/L — ABNORMAL LOW (ref 38–126)
Anion gap: 10 (ref 5–15)
BUN: 17 mg/dL (ref 8–23)
CO2: 22 mmol/L (ref 22–32)
Calcium: 8.4 mg/dL — ABNORMAL LOW (ref 8.9–10.3)
Chloride: 103 mmol/L (ref 98–111)
Creatinine, Ser: 1.02 mg/dL (ref 0.61–1.24)
GFR, Estimated: >60 mL/min (ref >60)
Glucose, Bld: 116 mg/dL — ABNORMAL HIGH (ref 70–99)
Potassium: 3.7 mmol/L (ref 3.5–5.1)
Sodium: 135 mmol/L (ref 135–145)
Total Bilirubin: 1.6 mg/dL — ABNORMAL HIGH (ref 0.3–1.2)
Total Protein: 5.8 g/dL — ABNORMAL LOW (ref 6.5–8.1)

## 2020-07-01 LAB — PROCALCITONIN: Procalcitonin: 0.82 ng/mL

## 2020-07-01 LAB — CBC
HCT: 30.9 % — ABNORMAL LOW (ref 39.0–52.0)
Hemoglobin: 10.8 g/dL — ABNORMAL LOW (ref 13.0–17.0)
MCH: 34.5 pg — ABNORMAL HIGH (ref 26.0–34.0)
MCHC: 35 g/dL (ref 30.0–36.0)
MCV: 98.7 fL (ref 80.0–100.0)
Platelets: 172 10*3/uL (ref 150–400)
RBC: 3.13 MIL/uL — ABNORMAL LOW (ref 4.22–5.81)
RDW: 12.6 % (ref 11.5–15.5)
WBC: 9.1 10*3/uL (ref 4.0–10.5)
nRBC: 0 % (ref 0.0–0.2)

## 2020-07-01 LAB — URIC ACID: Uric Acid, Serum: 4.9 mg/dL (ref 3.7–8.6)

## 2020-07-01 LAB — HIV ANTIBODY (ROUTINE TESTING W REFLEX): HIV Screen 4th Generation wRfx: NONREACTIVE

## 2020-07-01 LAB — RPR: RPR Ser Ql: NONREACTIVE

## 2020-07-01 LAB — VITAMIN D 25 HYDROXY (VIT D DEFICIENCY, FRACTURES): Vit D, 25-Hydroxy: 46.61 ng/mL (ref 30–100)

## 2020-07-01 MED ORDER — NAPROXEN 250 MG PO TABS
500.0000 mg | ORAL_TABLET | Freq: Two times a day (BID) | ORAL | Status: DC
  Administered 2020-07-01 – 2020-07-12 (×14): 500 mg via ORAL
  Filled 2020-07-01 (×26): qty 2

## 2020-07-01 MED ORDER — HALOPERIDOL LACTATE 5 MG/ML IJ SOLN
1.0000 mg | Freq: Four times a day (QID) | INTRAMUSCULAR | Status: DC | PRN
  Administered 2020-07-01: 1 mg via INTRAVENOUS
  Filled 2020-07-01: qty 1

## 2020-07-01 MED ORDER — MAGNESIUM SULFATE 2 GM/50ML IV SOLN
2.0000 g | Freq: Once | INTRAVENOUS | Status: AC
  Administered 2020-07-01: 2 g via INTRAVENOUS
  Filled 2020-07-01: qty 50

## 2020-07-01 MED ORDER — PANTOPRAZOLE SODIUM 40 MG PO TBEC
40.0000 mg | DELAYED_RELEASE_TABLET | Freq: Every day | ORAL | Status: DC
  Administered 2020-07-01: 40 mg via ORAL
  Filled 2020-07-01: qty 1

## 2020-07-01 MED ORDER — LORAZEPAM 2 MG/ML IJ SOLN
2.0000 mg | INTRAMUSCULAR | Status: DC | PRN
  Administered 2020-07-01: 2 mg via INTRAVENOUS
  Filled 2020-07-01: qty 1

## 2020-07-01 MED ORDER — PREDNISONE 20 MG PO TABS: 40.0000 mg | ORAL_TABLET | Freq: Every day | ORAL | Status: DC

## 2020-07-01 MED ORDER — HALOPERIDOL LACTATE 5 MG/ML IJ SOLN
5.0000 mg | Freq: Four times a day (QID) | INTRAMUSCULAR | Status: DC | PRN
  Administered 2020-07-02 – 2020-07-03 (×2): 5 mg via INTRAVENOUS
  Filled 2020-07-01 (×2): qty 1

## 2020-07-01 MED ORDER — MORPHINE SULFATE (PF) 4 MG/ML IV SOLN
4.0000 mg | INTRAVENOUS | Status: DC | PRN
  Administered 2020-07-03 – 2020-07-05 (×4): 4 mg via INTRAVENOUS
  Filled 2020-07-01 (×4): qty 1

## 2020-07-01 MED ORDER — SODIUM CHLORIDE 0.9 % IV SOLN: INTRAVENOUS | Status: AC

## 2020-07-01 MED ORDER — CYANOCOBALAMIN 1000 MCG/ML IJ SOLN: 1000.0000 ug | Freq: Every day | INTRAMUSCULAR | Status: DC

## 2020-07-01 MED ORDER — DEXTROSE 5 % IV SOLN
800.0000 mg | Freq: Three times a day (TID) | INTRAVENOUS | Status: DC
  Administered 2020-07-01 – 2020-07-02 (×3): 800 mg via INTRAVENOUS
  Filled 2020-07-01 (×5): qty 16

## 2020-07-01 MED ORDER — CYANOCOBALAMIN 1000 MCG/ML IJ SOLN
1000.0000 ug | INTRAMUSCULAR | Status: DC
  Administered 2020-07-12: 1000 ug via INTRAMUSCULAR
  Filled 2020-07-01: qty 1

## 2020-07-01 MED ORDER — NAPROXEN 250 MG PO TABS: 500.0000 mg | ORAL_TABLET | Freq: Two times a day (BID) | ORAL | Status: DC

## 2020-07-01 MED ORDER — PANTOPRAZOLE SODIUM 40 MG IV SOLR
40.0000 mg | Freq: Two times a day (BID) | INTRAVENOUS | Status: DC
  Administered 2020-07-01 – 2020-07-07 (×13): 40 mg via INTRAVENOUS
  Filled 2020-07-01 (×13): qty 40

## 2020-07-01 MED ORDER — VANCOMYCIN HCL 750 MG/150ML IV SOLN
750.0000 mg | Freq: Two times a day (BID) | INTRAVENOUS | Status: DC
  Administered 2020-07-01 – 2020-07-03 (×5): 750 mg via INTRAVENOUS
  Filled 2020-07-01 (×6): qty 150

## 2020-07-01 MED ORDER — PREDNISONE 20 MG PO TABS
40.0000 mg | ORAL_TABLET | Freq: Every day | ORAL | Status: DC
Start: 2020-07-01 — End: 2020-07-01

## 2020-07-01 MED ORDER — CYANOCOBALAMIN 1000 MCG/ML IJ SOLN: 1000.0000 ug | INTRAMUSCULAR | Status: DC

## 2020-07-01 MED ORDER — CYANOCOBALAMIN 1000 MCG/ML IJ SOLN
1000.0000 ug | Freq: Every day | INTRAMUSCULAR | Status: AC
  Administered 2020-07-01 – 2020-07-05 (×4): 1000 ug via INTRAMUSCULAR
  Filled 2020-07-01 (×6): qty 1

## 2020-07-01 NOTE — Procedures (Signed)
Patient Name: Patrick Macias  MRN: [id number]  Epilepsy Attending: Charlsie Quest  Referring Physician/Provider: Dr Marvel Plan Date: 07/01/2020 Duration: 22.58 mins  Patient history: y.o. male with PMH of HTN, HLD, recent cognitive decline and recent admission for AMS admitted again for AMS, rash, fever, and whole body ache. EEG to evaluate for seizure  Level of alertness: asleep  AEDs during EEG study: Ativan  Technical aspects: This EEG study was done with scalp electrodes positioned according to the 10-20 International system of electrode placement. Electrical activity was acquired at a sampling rate of 500Hz  and reviewed with a high frequency filter of 70Hz  and a low frequency filter of 1Hz . EEG data were recorded continuously and digitally stored.   Description: No posterior dominant rhythm was seen. Sleep was characterized by vertex waves, sleep spindles (12 to 14 Hz), maximal frontocentral region.  EEG showed continuous generalized 3 to 6 Hz theta-delta slowing.  Hyperventilation and photic stimulation were not performed.     ABNORMALITY -Continuous slow, generalized  IMPRESSION: This study is suggestive of moderate diffuse encephalopathy, nonspecific etiology. No seizures or epileptiform discharges were seen throughout the recording.  Muriah Harsha 

## 2020-07-01 NOTE — Progress Notes (Deleted)
[facility] PROGRESS NOTE    Progress Note  Patrick Macias  KPT:[id number] DOB: 1940/12/30 DOA: 06/30/2020 PCP: Rodrigo Ran, MD     Brief Narrative:   Patrick Macias is an 80 y.o. male past medical history of vascular dementia, essential hypertension recently discharged from the hospital for TIA was discharged on aspirin and Plavix, was brought into the ED for fevers and altered mental status for 1 week in the ED he was found afebrile with a CT scan showed no acute findings, chest x-ray was unremarkable electrolytes glucose and BUN were abnormal but not significantly.  Patient was transferred to [facility] for an MRI  Assessment/Plan:   Acute metabolic encephalopathy superimposed on vascular dementia: He has been having temperature of 100.5 repeatedly, he was started empirically on IV vancomycin cefepime and Flagyl. Has no leukocytosis, TSH 0.8 influenza and COVID-19 PCR negative. Neurology was consulted they recommended an MRI. Hold galantamine. Culture data has been sent. EEG is pending. Replete magnesium, supplement B12. Continue aspirin.  Fever of unknown origin: Chest x-ray shows no sign of pneumonia, UA shows no signs of infection. Blood culture urine culture have been sent. Multiple imaging were done that did not show an effusion. He does have a past medical history of gout almost 20 years ago but he has multiarthropathy bilateral wrist elbows left shoulder right knee and right hip. He does have a fever who could that be explained also by gout, will go ahead and start him on naproxen, will start empirically on Protonix. Unfortunately there is not a significant effusion to perform a arthrocentesis.  I have explained the risk and benefits to the family. Spoken to the pharmacy galantamine does not causes gout.  He is on hydrochlorothiazide at home.  Essential hypertension: Holding antihypertensive medication allow permissive hypertension for concerns TIA  DVT  prophylaxis: lovenox Family Communication:none Status is: Observation  The patient remains OBS appropriate and will d/c before 2 midnights.  Dispo: The patient is from: Home              Anticipated d/c is to: Home              Anticipated d/c date is: 3 days              Patient currently is not medically stable to d/c.        Code Status:     Code Status Orders  (From admission, onward)         Start     Ordered   06/30/20 2007  Full code  Continuous        06/30/20 2011        Code Status History    Date Active Date Inactive Code Status Order ID Comments User Context   06/01/2020 2213 06/02/2020 2216 Partial Code [id number]  Synetta Fail, MD Inpatient   06/01/2020 2130 06/01/2020 2212 Full Code [id number]  Synetta Fail, MD Inpatient   Advance Care Planning Activity        IV Access:    Peripheral IV   Procedures and diagnostic studies:   CT Head Wo Contrast  Result Date: 06/30/2020 CLINICAL DATA:  Mental status changes EXAM: CT HEAD WITHOUT CONTRAST TECHNIQUE: Contiguous axial images were obtained from the base of the skull through the vertex without intravenous contrast. COMPARISON:  06/01/2020 FINDINGS: Brain: There is atrophy and chronic small vessel disease changes. No acute intracranial abnormality. Specifically, no hemorrhage, hydrocephalus, mass lesion, acute infarction, or significant intracranial injury.  Vascular: No hyperdense vessel or unexpected calcification. Skull: No acute calvarial abnormality. Sinuses/Orbits: No acute findings Other: None IMPRESSION: Atrophy, chronic microvascular disease. No acute intracranial abnormality. Electronically Signed   By: Charlett NoseKevin  Dover M.D.   On: 06/30/2020 12:38   DG Hand 2 View Left  Result Date: 06/30/2020 CLINICAL DATA:  Acute pain EXAM: LEFT HAND - 2 VIEW COMPARISON:  None. FINDINGS: There is no evidence of fracture or dislocation. There is no evidence of arthropathy or other focal bone abnormality.  Soft tissues are unremarkable. IMPRESSION: Negative. Electronically Signed   By: Jasmine PangKim  Fujinaga M.D.   On: 06/30/2020 23:19   DG Chest Port 1 View  Result Date: 06/30/2020 CLINICAL DATA:  Hand pain after injury, confusion EXAM: PORTABLE CHEST 1 VIEW COMPARISON:  06/01/2020 chest radiograph. FINDINGS: Stable cardiomediastinal silhouette with normal heart size. No pneumothorax. No pleural effusion. Lungs appear clear, with no acute consolidative airspace disease and no pulmonary edema. IMPRESSION: No active disease. Electronically Signed   By: Delbert PhenixJason A Poff M.D.   On: 06/30/2020 09:13   DG Shoulder Left Port  Result Date: 06/30/2020 CLINICAL DATA:  Shoulder pain EXAM: LEFT SHOULDER COMPARISON:  None. FINDINGS: Mild AC joint degenerative change. Probable small amount of calcific tendinitis. No fracture or dislocation. IMPRESSION: 1. No acute osseous abnormality. 2. Probable small amount of calcific tendinitis. Electronically Signed   By: Jasmine PangKim  Fujinaga M.D.   On: 06/30/2020 23:22   DG Shoulder Right Port  Result Date: 06/30/2020 CLINICAL DATA:  Acute shoulder pain EXAM: PORTABLE RIGHT SHOULDER COMPARISON:  None. FINDINGS: Mild AC joint degenerative change. No fracture or malalignment, limited positioning on the Y-view. IMPRESSION: No acute osseous abnormality. Electronically Signed   By: Jasmine PangKim  Fujinaga M.D.   On: 06/30/2020 23:21   DG Hand Complete Right  Result Date: 06/30/2020 CLINICAL DATA:  Right hand pain, possible injury EXAM: RIGHT HAND - COMPLETE 3+ VIEW COMPARISON:  None. FINDINGS: There is no evidence of fracture or dislocation. No evidence of inflammatory arthropathy. Circumscribed lucencies in the heads of the third and fourth metacarpal demonstrate a thin peripheral sclerotic margin. These most likely represent subchondral geodes. Soft tissues are unremarkable. IMPRESSION: Negative. Electronically Signed   By: Malachy MoanHeath  McCullough M.D.   On: 06/30/2020 09:23   DG HIP PORT UNILAT WITH PELVIS 1V  LEFT  Result Date: 06/30/2020 CLINICAL DATA:  Acute hip pain EXAM: DG HIP (WITH OR WITHOUT PELVIS) 1V PORT LEFT COMPARISON:  None. FINDINGS: There is no evidence of hip fracture or dislocation. There is no evidence of arthropathy or other focal bone abnormality. IMPRESSION: Negative. Electronically Signed   By: Jasmine PangKim  Fujinaga M.D.   On: 06/30/2020 23:20   DG HIP PORT UNILAT WITH PELVIS 1V RIGHT  Result Date: 06/30/2020 CLINICAL DATA:  Acute hip pain EXAM: DG HIP (WITH OR WITHOUT PELVIS) 1V PORT RIGHT COMPARISON:  None. FINDINGS: There is no evidence of hip fracture or dislocation. There is no evidence of arthropathy or other focal bone abnormality. IMPRESSION: Negative. Electronically Signed   By: Jasmine PangKim  Fujinaga M.D.   On: 06/30/2020 23:20     Medical Consultants:    None.  Anti-Infectives:   Vanc, cefepiem and falgyl  Subjective:    Patrick Macias he relates pain in his shoulder lower back bilateral wrist and multiple other joints including knee and hip  Objective:    Vitals:   06/30/20 1904 06/30/20 2116 07/01/20 0047 07/01/20 0350  BP: (!) 144/63  (!) 123/50 131/61  Pulse: 92  80  78  Resp: 18  16 18   Temp: 100 F (37.8 C) 100 F (37.8 C) (!) 100.5 F (38.1 C) 99.2 F (37.3 C)  TempSrc: Oral Oral Oral Oral  SpO2: 97%  98% 100%  Weight:    82.4 kg  Height:       SpO2: 100 % O2 Flow Rate (L/min): 2 L/min   Intake/Output Summary (Last 24 hours) at 07/01/2020 0814 Last data filed at 07/01/2020 0343 Gross per 24 hour  Intake 2448 ml  Output 250 ml  Net 2196/10/21 ml   Filed Weights   06/30/20 0830 07/01/20 0350  Weight: 77.1 kg 82.4 kg    Exam: General exam: In no acute distress. Respiratory system: Good air movement and clear to auscultation. Cardiovascular system: S1 & S2 heard, RRR. No JVD. Gastrointestinal system: Abdomen is nondistended, soft and nontender.  Extremities: Swelling of bilateral hip with erythema and warmth to touch left knee painful left hip on  external rotation Skin: No rashes, lesions or ulcers Psychiatry: Judgement and insight appear normal. Mood & affect appropriate.    Data Reviewed:    Labs: Basic Metabolic Panel: Recent Labs  Lab 06/30/20 0831 06/30/20 Oct 21, 2120 07/01/20 0323  NA 134*  --  135  K 3.6  --  3.7  CL 102  --  103  CO2 22  --  22  GLUCOSE 203*  --  116*  BUN 27*  --  17  CREATININE 1.13  --  1.02  CALCIUM 8.6*  --  8.4*  MG  --  1.6*  --   PHOS  --  3.0  --    GFR Estimated Creatinine Clearance: 64.6 mL/min (by C-G formula based on SCr of 1.02 mg/dL). Liver Function Tests: Recent Labs  Lab 06/30/20 0831 07/01/20 0323  AST 24 24  ALT 16 15  ALKPHOS 29* 26*  BILITOT 1.5* 1.6*  PROT 6.7 5.8*  ALBUMIN 3.6 2.9*   No results for input(s): LIPASE, AMYLASE in the last 168 hours. Recent Labs  Lab 06/30/20 10/21/2120  AMMONIA 28   Coagulation profile Recent Labs  Lab 06/30/20 0831  INR 1.2   COVID-19 Labs  Recent Labs    06/30/20 0831  CRP 12.6*    Lab Results  Component Value Date   SARSCOV2NAA NEGATIVE 06/30/2020   [place] NEGATIVE 06/01/2020    CBC: Recent Labs  Lab 06/30/20 0831 07/01/20 0323  WBC 7.1 9.1  NEUTROABS 5.6  --   HGB 10.9* 10.8*  HCT 31.7* 30.9*  MCV 99.7 98.7  PLT 181 172   Cardiac Enzymes: Recent Labs  Lab 06/30/20 2122  CKTOTAL 398*   BNP (last 3 results) No results for input(s): PROBNP in the last 8760 hours. CBG: Recent Labs  Lab 06/30/20 1544  GLUCAP 114*   D-Dimer: No results for input(s): DDIMER in the last 72 hours. Hgb A1c: No results for input(s): HGBA1C in the last 72 hours. Lipid Profile: No results for input(s): CHOL, HDL, LDLCALC, TRIG, CHOLHDL, LDLDIRECT in the last 72 hours. Thyroid function studies: Recent Labs    06/30/20 October 21, 2120  TSH 0.873   Anemia work up: Recent Labs    06/30/20 10-21-20  VITAMINB12 221  FOLATE 10.1   Sepsis Labs: Recent Labs  Lab 06/30/20 0831 06/30/20 1035 07/01/20 0323  WBC 7.1  --  9.1   LATICACIDVEN 2.1* 1.2  --    Microbiology Recent Results (from the past 240 hour(s))  Resp Panel by RT-PCR (Flu A&B, Covid) Nasopharyngeal Swab  Status: None   Collection Time: 06/30/20 10:34 AM   Specimen: Nasopharyngeal Swab; Nasopharyngeal(NP) swabs in vial transport medium  Result Value Ref Range Status   SARS Coronavirus 2 by RT PCR NEGATIVE NEGATIVE Final    Comment: (NOTE) SARS-CoV-2 target nucleic acids are NOT DETECTED.  The SARS-CoV-2 RNA is generally detectable in upper respiratory specimens during the acute phase of infection. The lowest concentration of SARS-CoV-2 viral copies this assay can detect is 138 copies/mL. A negative result does not preclude SARS-Cov-2 infection and should not be used as the sole basis for treatment or other patient management decisions. A negative result may occur with  improper specimen collection/handling, submission of specimen other than nasopharyngeal swab, presence of viral mutation(s) within the areas targeted by this assay, and inadequate number of viral copies(<138 copies/mL). A negative result must be combined with clinical observations, patient history, and epidemiological information. The expected result is Negative.  Fact Sheet for Patients:  EntrepreneurPulse.com.au  Fact Sheet for Healthcare Providers:  IncredibleEmployment.be  This test is no t yet approved or cleared by the [place] FDA and  has been authorized for detection and/or diagnosis of SARS-CoV-2 by FDA under an Emergency Use Authorization (EUA). This EUA will remain  in effect (meaning this test can be used) for the duration of the COVID-19 declaration under Section 564(b)(1) of the Act, 21 U.S.C.section 360bbb-3(b)(1), unless the authorization is terminated  or revoked sooner.       Influenza A by PCR NEGATIVE NEGATIVE Final   Influenza B by PCR NEGATIVE NEGATIVE Final    Comment: (NOTE) The Xpert Xpress  SARS-CoV-2/FLU/RSV plus assay is intended as an aid in the diagnosis of influenza from Nasopharyngeal swab specimens and should not be used as a sole basis for treatment. Nasal washings and aspirates are unacceptable for Xpert Xpress SARS-CoV-2/FLU/RSV testing.  Fact Sheet for Patients: EntrepreneurPulse.com.au  Fact Sheet for Healthcare Providers: IncredibleEmployment.be  This test is not yet approved or cleared by the [place] FDA and has been authorized for detection and/or diagnosis of SARS-CoV-2 by FDA under an Emergency Use Authorization (EUA). This EUA will remain in effect (meaning this test can be used) for the duration of the COVID-19 declaration under Section 564(b)(1) of the Act, 21 U.S.C. section 360bbb-3(b)(1), unless the authorization is terminated or revoked.  Performed at [facility], [address] [place], [place] [address]      Medications:   . aspirin EC  81 mg Oral Daily  . cyanocobalamin  1,000 mcg Intramuscular Daily   Followed by  . [START ON 07/12/2020] cyanocobalamin  1,000 mcg Intramuscular Q Thu   Followed by  . [START ON 08/30/2020] cyanocobalamin  1,000 mcg Intramuscular Q28 days  . enoxaparin (LOVENOX) injection  40 mg Subcutaneous Q24H  . LORazepam  0.5 mg Intravenous Once  . rosuvastatin  20 mg Oral QHS   Continuous Infusions: . ceFEPime (MAXIPIME) IV        LOS: 0 days   Charlynne Cousins  [facility]  07/01/2020, 8:14 AM

## 2020-07-01 NOTE — Significant Event (Signed)
Rapid Response Event Note   Reason for Call :  Possible seizure activity     Initial Focused Assessment:  Called by the RN stating that her patient started convulsing and had a seizure lasting for about 30 seconds.  When I got to the room the patient was talking and moving around in his bed.  He did not appear to be post ictal in my opinion.  The patient was extremely warm to touch.  He was alert, but quite disoriented and speaking sentences that did not make sense.  He followed some commands, thumbs up on right hand as well as plantar extension on both lower extremities.  The patient appears to be very delirious, and his daughter explained that he had not been sleeping well.  The RN had given 5 of haldol before I got to the room and they reported that the patient appeared to be more disoriented after receiving the medication.  Dr. Robb Matar came to the room shortly after I arrived.  Temp 101.9 rectal BP 148/67 ECG 96 RR 18 O2 96 on room air     Interventions:  Vitals taken, rectal temp obtained, tylenol PR given, plan of care discussed with the RN and MD  Plan of Care:  The patient is going to have a LP, but has to remain still and prone while they are obtaining the LP.  MD ordered ativan and haldol to sedate the patient for the procedure.  The MD also ordered IV acyclovir to treat presumed encephalitis.     Event Summary:   MD Notified: MD Robb Matar Call Time: 1337 Arrival Time: 1340 End Time: 1440  Andrey Spearman, RN

## 2020-07-01 NOTE — Progress Notes (Signed)
EEG complete - results pending 

## 2020-07-01 NOTE — Progress Notes (Signed)
Called to the room by patient daughter, family stated patient has seizure activity for about 10 seconds few minutes haldol was given patient became agitated, combative, temp elevated suppository tylenol given MD notified, MD came to assess the patient and RR called for support .  Also unable to do MRI despite the pre procedure med ativan x2. MD aware. Patient is sleeping now report given to night shift RN.

## 2020-07-01 NOTE — Progress Notes (Signed)
Pharmacy Antibiotic Note  Patrick Macias is a 80 y.o. male admitted on 06/30/2020 with unknown source with metabolic encephalopathy. MRI negative, B12 and Mg low. Rule out HSV encephalitis. Pharmacy has been consulted for vanc, cefepime, and acyclovir dosing. CrCL 58 ml/min  Plan: Acyclovir 800 mg IV every 8 hours Cefepime 2 gm IV q12hr Vanc 750 mg IV q12hr Monitor renal function, clinical status, C&S and vanc levels as needed  Height: 5\' 11"  (180.3 cm) Weight: 82.4 kg (181 lb 10.5 oz) IBW/kg (Calculated) : 75.3  Temp (24hrs), Avg:99.9 F (37.7 C), Min:98 F (36.7 C), Max:101.4 F (38.6 C)  Recent Labs  Lab 06/30/20 0831 06/30/20 1035 07/01/20 0323  WBC 7.1  --  9.1  CREATININE 1.13  --  1.02  LATICACIDVEN 2.1* 1.2  --     Estimated Creatinine Clearance: 64.6 mL/min (by C-G formula based on SCr of 1.02 mg/dL).    No Known Allergies  Antimicrobials this admission: Cefepime 1/1 >>  Vanc 1/1 >>  Acyclovir 1/2 >>  Thank you for allowing pharmacy to be a part of this patient's care.  08/29/20, PharmD PGY1 Acute Care Pharmacy Resident Phone: [phone] 07/01/2020 1:55 PM  Please check [email or web address] for unit specific pharmacy phone numbers.

## 2020-07-01 NOTE — Progress Notes (Signed)
EEG will come back later patient is not in room.

## 2020-07-01 NOTE — Progress Notes (Addendum)
[facility] PROGRESS NOTE    Progress Note  Patrick Macias  HQI:[id number] DOB: 06-13-1941 DOA: 06/30/2020 PCP: Rodrigo Ran, MD     Brief Narrative:   Patrick Macias is an 80 y.o. male past medical history of vascular dementia, essential hypertension recently discharged from the hospital for TIA was discharged on aspirin and Plavix, was brought into the ED for fevers and altered mental status for 1 week in the ED he was found afebrile with a CT scan showed no acute findings, chest x-ray was unremarkable electrolytes glucose and BUN were abnormal but not significantly.  Patient was transferred to [facility] for an MRI  Assessment/Plan:   Acute metabolic encephalopathy superimposed on vascular dementia: He has been having temperature of 100.5 repeatedly, he was started empirically on IV vancomycin cefepime, Flagyl and acyclovir for possible herpes encephalitis. Has no leukocytosis, TSH 0.8 influenza and COVID-19 PCR negative. Neurology was consulted they recommended an MRI. Hold galantamine. Culture data has been sent. EEG is pending.  The daughter came out of the room screaming that he was having a seizure, but the patient was not post ictal he was able to follow simple commands did not lose control of sphincters. Replete magnesium, supplement B12. Continue aspirin.  Fever of unknown origin: Chest x-ray shows no sign of pneumonia, UA shows no signs of infection. Blood culture urine culture have been sent. Multiple imaging were done that did not show an effusion or fractures. He does have a past medical history of gout almost 20 years ago but he has multiarthropathy bilateral wrist elbows left shoulder right knee and right hip.  Also in the differential will include viral meningitis versus viral encephalitis. He does have a fever who could that be explained also by gout, will go ahead and start him on naproxen, will start empirically on Protonix. Unfortunately there is not a  significant effusion to perform a arthrocentesis.  I have explained the risk and benefits to the family. Spoken to the pharmacy galantamine does not causes gout.  He is on hydrochlorothiazide at home. He had another temperature this morning of 101.5, he was started empirically on acyclovir, awaiting EEG will discuss with neurology. We will start him empirically on acyclovir. Consult IR to perform LP ct guided.  Essential hypertension: Holding antihypertensive medication allow permissive hypertension for concerns TIA  DVT prophylaxis: lovenox Family Communication:none Status is: Observation  The patient remains OBS appropriate and will d/c before 2 midnights.  Dispo: The patient is from: Home              Anticipated d/c is to: Home              Anticipated d/c date is: 3 days              Patient currently is not medically stable to d/c.        Code Status:     Code Status Orders  (From admission, onward)         Start     Ordered   06/30/20 2007  Full code  Continuous        06/30/20 2011        Code Status History    Date Active Date Inactive Code Status Order ID Comments User Context   06/01/2020 2213 06/02/2020 2216 Partial Code [id number]  Synetta Fail, MD Inpatient   06/01/2020 2130 06/01/2020 2212 Full Code [id number]  Synetta Fail, MD Inpatient   Advance Care Planning Activity  IV Access:    Peripheral IV   Procedures and diagnostic studies:   CT Head Wo Contrast  Result Date: 06/30/2020 CLINICAL DATA:  Mental status changes EXAM: CT HEAD WITHOUT CONTRAST TECHNIQUE: Contiguous axial images were obtained from the base of the skull through the vertex without intravenous contrast. COMPARISON:  06/01/2020 FINDINGS: Brain: There is atrophy and chronic small vessel disease changes. No acute intracranial abnormality. Specifically, no hemorrhage, hydrocephalus, mass lesion, acute infarction, or significant intracranial injury. Vascular: No  hyperdense vessel or unexpected calcification. Skull: No acute calvarial abnormality. Sinuses/Orbits: No acute findings Other: None IMPRESSION: Atrophy, chronic microvascular disease. No acute intracranial abnormality. Electronically Signed   By: Rolm Baptise M.D.   On: 06/30/2020 12:38   DG Hand 2 View Left  Result Date: 06/30/2020 CLINICAL DATA:  Acute pain EXAM: LEFT HAND - 2 VIEW COMPARISON:  None. FINDINGS: There is no evidence of fracture or dislocation. There is no evidence of arthropathy or other focal bone abnormality. Soft tissues are unremarkable. IMPRESSION: Negative. Electronically Signed   By: Donavan Foil M.D.   On: 06/30/2020 23:19   DG Chest Port 1 View  Result Date: 06/30/2020 CLINICAL DATA:  Hand pain after injury, confusion EXAM: PORTABLE CHEST 1 VIEW COMPARISON:  06/01/2020 chest radiograph. FINDINGS: Stable cardiomediastinal silhouette with normal heart size. No pneumothorax. No pleural effusion. Lungs appear clear, with no acute consolidative airspace disease and no pulmonary edema. IMPRESSION: No active disease. Electronically Signed   By: Ilona Sorrel M.D.   On: 06/30/2020 09:13   DG Shoulder Left Port  Result Date: 06/30/2020 CLINICAL DATA:  Shoulder pain EXAM: LEFT SHOULDER COMPARISON:  None. FINDINGS: Mild AC joint degenerative change. Probable small amount of calcific tendinitis. No fracture or dislocation. IMPRESSION: 1. No acute osseous abnormality. 2. Probable small amount of calcific tendinitis. Electronically Signed   By: Donavan Foil M.D.   On: 06/30/2020 23:22   DG Shoulder Right Port  Result Date: 06/30/2020 CLINICAL DATA:  Acute shoulder pain EXAM: PORTABLE RIGHT SHOULDER COMPARISON:  None. FINDINGS: Mild AC joint degenerative change. No fracture or malalignment, limited positioning on the Y-view. IMPRESSION: No acute osseous abnormality. Electronically Signed   By: Donavan Foil M.D.   On: 06/30/2020 23:21   DG Hand Complete Right  Result Date:  06/30/2020 CLINICAL DATA:  Right hand pain, possible injury EXAM: RIGHT HAND - COMPLETE 3+ VIEW COMPARISON:  None. FINDINGS: There is no evidence of fracture or dislocation. No evidence of inflammatory arthropathy. Circumscribed lucencies in the heads of the third and fourth metacarpal demonstrate a thin peripheral sclerotic margin. These most likely represent subchondral geodes. Soft tissues are unremarkable. IMPRESSION: Negative. Electronically Signed   By: Jacqulynn Cadet M.D.   On: 06/30/2020 09:23   DG HIP PORT UNILAT WITH PELVIS 1V LEFT  Result Date: 06/30/2020 CLINICAL DATA:  Acute hip pain EXAM: DG HIP (WITH OR WITHOUT PELVIS) 1V PORT LEFT COMPARISON:  None. FINDINGS: There is no evidence of hip fracture or dislocation. There is no evidence of arthropathy or other focal bone abnormality. IMPRESSION: Negative. Electronically Signed   By: Donavan Foil M.D.   On: 06/30/2020 23:20   DG HIP PORT UNILAT WITH PELVIS 1V RIGHT  Result Date: 06/30/2020 CLINICAL DATA:  Acute hip pain EXAM: DG HIP (WITH OR WITHOUT PELVIS) 1V PORT RIGHT COMPARISON:  None. FINDINGS: There is no evidence of hip fracture or dislocation. There is no evidence of arthropathy or other focal bone abnormality. IMPRESSION: Negative. Electronically Signed   By:  Donavan Foil M.D.   On: 06/30/2020 23:20     Medical Consultants:    None.  Anti-Infectives:   Vanc, cefepiem and falgyl  Subjective:    Patrick Macias he relates pain in his shoulder lower back bilateral wrist and multiple other joints including knee and hip  Objective:    Vitals:   07/01/20 0350 07/01/20 0819 07/01/20 1230 07/01/20 1336  BP: 131/61 (!) 119/54 123/67 (!) 148/67  Pulse: 78 75 91 96  Resp: 18 18 18 18   Temp: 99.2 F (37.3 C) 98 F (36.7 C) 99.9 F (37.7 C) (!) 101.4 F (38.6 C)  TempSrc: Oral Oral Oral Oral  SpO2: 100% 98% 97% 96%  Weight: 82.4 kg     Height:       SpO2: 96 % O2 Flow Rate (L/min): 2 L/min   Intake/Output  Summary (Last 24 hours) at 07/01/2020 1417 Last data filed at 07/01/2020 1300 Gross per 24 hour  Intake 2724.09 ml  Output 250 ml  Net 2474.09 ml   Filed Weights   06/30/20 0830 07/01/20 0350  Weight: 77.1 kg 82.4 kg    Exam: General exam: In no acute distress. Respiratory system: Good air movement and clear to auscultation. Cardiovascular system: S1 & S2 heard, RRR. No JVD. Gastrointestinal system: Abdomen is nondistended, soft and nontender.  Extremities: Swelling of bilateral hip with erythema and warmth to touch left knee painful left hip on external rotation Skin: No rashes, lesions or ulcers Psychiatry: Judgement and insight appear normal. Mood & affect appropriate.    Data Reviewed:    Labs: Basic Metabolic Panel: Recent Labs  Lab 06/30/20 0831 06/30/20 2122 07/01/20 0323  NA 134*  --  135  K 3.6  --  3.7  CL 102  --  103  CO2 22  --  22  GLUCOSE 203*  --  116*  BUN 27*  --  17  CREATININE 1.13  --  1.02  CALCIUM 8.6*  --  8.4*  MG  --  1.6*  --   PHOS  --  3.0  --    GFR Estimated Creatinine Clearance: 64.6 mL/min (by C-G formula based on SCr of 1.02 mg/dL). Liver Function Tests: Recent Labs  Lab 06/30/20 0831 07/01/20 0323  AST 24 24  ALT 16 15  ALKPHOS 29* 26*  BILITOT 1.5* 1.6*  PROT 6.7 5.8*  ALBUMIN 3.6 2.9*   No results for input(s): LIPASE, AMYLASE in the last 168 hours. Recent Labs  Lab 06/30/20 2122  AMMONIA 28   Coagulation profile Recent Labs  Lab 06/30/20 0831  INR 1.2   COVID-19 Labs  Recent Labs    06/30/20 0831  CRP 12.6*    Lab Results  Component Value Date   SARSCOV2NAA NEGATIVE 06/30/2020   [place] NEGATIVE 06/01/2020    CBC: Recent Labs  Lab 06/30/20 0831 07/01/20 0323  WBC 7.1 9.1  NEUTROABS 5.6  --   HGB 10.9* 10.8*  HCT 31.7* 30.9*  MCV 99.7 98.7  PLT 181 172   Cardiac Enzymes: Recent Labs  Lab 06/30/20 2122  CKTOTAL 398*   BNP (last 3 results) No results for input(s): PROBNP in the  last 8760 hours. CBG: Recent Labs  Lab 06/30/20 1544  GLUCAP 114*   D-Dimer: No results for input(s): DDIMER in the last 72 hours. Hgb A1c: No results for input(s): HGBA1C in the last 72 hours. Lipid Profile: No results for input(s): CHOL, HDL, LDLCALC, TRIG, CHOLHDL, LDLDIRECT in the last  72 hours. Thyroid function studies: Recent Labs    06/30/20 2120/10/13  TSH 0.873   Anemia work up: Recent Labs    06/30/20 2120/10/13  VITAMINB12 221  FOLATE 10.1   Sepsis Labs: Recent Labs  Lab 06/30/20 0831 06/30/20 1035 07/01/20 0323  WBC 7.1  --  9.1  LATICACIDVEN 2.1* 1.2  --    Microbiology Recent Results (from the past 240 hour(s))  Blood Culture (routine x 2)     Status: None (Preliminary result)   Collection Time: 06/30/20  8:31 AM   Specimen: BLOOD LEFT ARM  Result Value Ref Range Status   Specimen Description   Final    BLOOD LEFT ARM Performed at [facility], [address] [place], [place] [address]    Special Requests   Final    BOTTLES DRAWN AEROBIC AND ANAEROBIC Blood Culture adequate volume Performed at [facility], [address] [place], [place] [address]    Culture   Final    NO GROWTH < 24 HOURS Performed at [facility], [place] [address], [place], [place] [address]    Report Status PENDING  Incomplete  Urine culture     Status: None   Collection Time: 06/30/20  8:32 AM   Specimen: In/Out Cath Urine  Result Value Ref Range Status   Specimen Description   Final    IN/OUT CATH URINE Performed at [facility], [address] [place], [place] [address]    Special Requests   Final    NONE Performed at [facility], [address] [place], [place] [address]    Culture   Final    NO GROWTH Performed at [facility], [place] [address] [place], [place] [address]    Report Status 07/01/2020 FINAL  Final  Blood Culture (routine x 2)     Status: None (Preliminary result)   Collection Time: 06/30/20  9:00  AM   Specimen: BLOOD LEFT HAND  Result Value Ref Range Status   Specimen Description   Final    BLOOD LEFT HAND Performed at [facility], [address] [place], [place] [address]    Special Requests   Final    BOTTLES DRAWN AEROBIC AND ANAEROBIC Blood Culture adequate volume Performed at [facility], [address] [place], [place] [address]    Culture   Final    NO GROWTH < 24 HOURS Performed at [facility], [place] [address] [place], [place] [address]    Report Status PENDING  Incomplete  Resp Panel by RT-PCR (Flu A&B, Covid) Nasopharyngeal Swab     Status: None   Collection Time: 06/30/20 10:34 AM   Specimen: Nasopharyngeal Swab; Nasopharyngeal(NP) swabs in vial transport medium  Result Value Ref Range Status   SARS Coronavirus 2 by RT PCR NEGATIVE NEGATIVE Final    Comment: (NOTE) SARS-CoV-2 target nucleic acids are NOT DETECTED.  The SARS-CoV-2 RNA is generally detectable in upper respiratory specimens during the acute phase of infection. The lowest concentration of SARS-CoV-2 viral copies this assay can detect is 138 copies/mL. A negative result does not preclude SARS-Cov-2 infection and should not be used as the sole basis for treatment or other patient management decisions. A negative result may occur with  improper specimen collection/handling, submission of specimen other than nasopharyngeal swab, presence of viral mutation(s) within the areas targeted by this assay, and inadequate number of  viral copies(<138 copies/mL). A negative result must be combined with clinical observations, patient history, and epidemiological information. The expected result is Negative.  Fact Sheet for Patients:  EntrepreneurPulse.com.au  Fact Sheet for Healthcare Providers:  IncredibleEmployment.be  This test is no t yet approved or cleared by the [place] FDA and  has been authorized for detection and/or  diagnosis of SARS-CoV-2 by FDA under an Emergency Use Authorization (EUA). This EUA will remain  in effect (meaning this test can be used) for the duration of the COVID-19 declaration under Section 564(b)(1) of the Act, 21 U.S.C.section 360bbb-3(b)(1), unless the authorization is terminated  or revoked sooner.       Influenza A by PCR NEGATIVE NEGATIVE Final   Influenza B by PCR NEGATIVE NEGATIVE Final    Comment: (NOTE) The Xpert Xpress SARS-CoV-2/FLU/RSV plus assay is intended as an aid in the diagnosis of influenza from Nasopharyngeal swab specimens and should not be used as a sole basis for treatment. Nasal washings and aspirates are unacceptable for Xpert Xpress SARS-CoV-2/FLU/RSV testing.  Fact Sheet for Patients: EntrepreneurPulse.com.au  Fact Sheet for Healthcare Providers: IncredibleEmployment.be  This test is not yet approved or cleared by the [place] FDA and has been authorized for detection and/or diagnosis of SARS-CoV-2 by FDA under an Emergency Use Authorization (EUA). This EUA will remain in effect (meaning this test can be used) for the duration of the COVID-19 declaration under Section 564(b)(1) of the Act, 21 U.S.C. section 360bbb-3(b)(1), unless the authorization is terminated or revoked.  Performed at [facility], [address] [place], [place] [address]      Medications:   . aspirin EC  81 mg Oral Daily  . cyanocobalamin  1,000 mcg Intramuscular Daily   Followed by  . [START ON 07/12/2020] cyanocobalamin  1,000 mcg Intramuscular Q Thu   Followed by  . [START ON 08/30/2020] cyanocobalamin  1,000 mcg Intramuscular Q28 days  . enoxaparin (LOVENOX) injection  40 mg Subcutaneous Q24H  . LORazepam  0.5 mg Intravenous Once  . naproxen  500 mg Oral BID WC  . pantoprazole  40 mg Oral Daily  . rosuvastatin  20 mg Oral QHS   Continuous Infusions: . sodium chloride    . acyclovir    . ceFEPime  (MAXIPIME) IV 2 g (07/01/20 1039)  . vancomycin        LOS: 0 days   Charlynne Cousins  [facility]  07/01/2020, 2:17 PM

## 2020-07-01 NOTE — Progress Notes (Signed)
OT Cancellation Note  Patient Details Name: NAKUL AVINO MRN: [id number] DOB: 05-Dec-1940   Cancelled Treatment:    Reason Eval/Treat Not Completed: Medical issues which prohibited therapy; pt with rapid response team present in room, spoke with RN who requests OT hold therapies at this time. Will follow up as able.  Marcy Siren, OT Acute Rehabilitation Services Pager [phone] Office [phone]    Orlando Penner 07/01/2020, 2:10 PM

## 2020-07-01 NOTE — Evaluation (Addendum)
Physical Therapy Evaluation Patient Details Name: Patrick Macias MRN: [id number] DOB: Oct 28, 1940 Today's Date: 07/01/2020   History of Present Illness  Pt adm with AMS, fever of unknown origin and rash/hives. Brain MRI negative for acute abnormality. ? gout.  PMH - dementia, HTN, ?TIA.  Clinical Impression  Pt presents to PT with unsteady gait and cognitive deficits. Expect mobility will improve if cognition improves. Recommend HHPT and 24 hour supervision if pt improves as expected.     Follow Up Recommendations Home health PT;Supervision/Assistance - 24 hour    Equipment Recommendations  Other (comment) (possibly rollator)    Recommendations for Other Services       Precautions / Restrictions        Mobility  Bed Mobility Overal bed mobility: Needs Assistance Bed Mobility: Supine to Sit     Supine to sit: Mod assist     General bed mobility comments: Assist to bring legs off of bed, elevate trunk into sitting, and bring hips to EOB. Poor cognition likely impacting this.    Transfers Overall transfer level: Needs assistance Equipment used: Rolling walker (2 wheeled) Transfers: Sit to/from Stand Sit to Stand: Min assist         General transfer comment: Assist to bring hips up and for balance  Ambulation/Gait Ambulation/Gait assistance: Min assist;+2 safety/equipment Gait Distance (Feet): 100 Feet Assistive device: Rolling walker (2 wheeled) Gait Pattern/deviations: Step-through pattern;Decreased stride length;Drifts right/left Gait velocity: decr Gait velocity interpretation: <1.8 ft/sec, indicate of risk for recurrent falls General Gait Details: Unsteady gait requiring assist for balance and to assist with managing walker  Stairs            Wheelchair Mobility    Modified Rankin (Stroke Patients Only)       Balance Overall balance assessment: Needs assistance Sitting-balance support: No upper extremity supported;Feet supported Sitting  balance-Leahy Scale: Fair     Standing balance support: Bilateral upper extremity supported;During functional activity Standing balance-Leahy Scale: Poor Standing balance comment: walker and min assist for static standing                             Pertinent Vitals/Pain Pain Assessment: Faces Faces Pain Scale: Hurts little more Pain Location: generalized Pain Descriptors / Indicators: Grimacing Pain Intervention(s): Limited activity within patient's tolerance;Monitored during session;Repositioned    Home Living Family/patient expects to be discharged to:: Private residence Living Arrangements: Spouse/significant other Available Help at Discharge: Family;Available PRN/intermittently Type of Home: House Home Access: Stairs to enter     Home Layout: One level Home Equipment: Cane - single point;Shower seat - built in      Prior Function Level of Independence: Independent               Hand Dominance   Dominant Hand: Left    Extremity/Trunk Assessment   Upper Extremity Assessment Upper Extremity Assessment: Defer to OT evaluation    Lower Extremity Assessment Lower Extremity Assessment: Generalized weakness       Communication   Communication: HOH  Cognition Arousal/Alertness: Awake/alert Behavior During Therapy: Restless Overall Cognitive Status: Impaired/Different from baseline Area of Impairment: Orientation;Attention;Memory;Following commands;Safety/judgement;Problem solving;Awareness                 Orientation Level: Disoriented to;Place;Time;Situation Current Attention Level: Sustained Memory: Decreased recall of precautions;Decreased short-term memory Following Commands: Follows one step commands consistently;Follows one step commands with increased time Safety/Judgement: Decreased awareness of safety;Decreased awareness of deficits Awareness: Intellectual Problem  Solving: Difficulty sequencing;Decreased initiation;Requires  tactile cues;Requires verbal cues;Slow processing        General Comments General comments (skin integrity, edema, etc.): VSS. SpO2 98% on RA. Left O2 off.    Exercises     Assessment/Plan    PT Assessment Patient needs continued PT services  PT Problem List Decreased strength;Decreased balance;Decreased mobility;Decreased cognition;Decreased knowledge of use of DME       PT Treatment Interventions DME instruction;Gait training;Functional mobility training;Therapeutic activities;Therapeutic exercise;Balance training;Cognitive remediation;Patient/family education    PT Goals (Current goals can be found in the Care Plan section)  Acute Rehab PT Goals Patient Stated Goal: get up PT Goal Formulation: With patient/family Time For Goal Achievement: 07/15/20 Potential to Achieve Goals: Good    Frequency Min 3X/week   Barriers to discharge        Co-evaluation               AM-PAC PT "6 Clicks" Mobility  Outcome Measure Help needed turning from your back to your side while in a flat bed without using bedrails?: A Little Help needed moving from lying on your back to sitting on the side of a flat bed without using bedrails?: A Lot Help needed moving to and from a bed to a chair (including a wheelchair)?: A Little Help needed standing up from a chair using your arms (e.g., wheelchair or bedside chair)?: A Little Help needed to walk in hospital room?: A Little Help needed climbing 3-5 steps with a railing? : A Lot 6 Click Score: 16    End of Session Equipment Utilized During Treatment: Gait belt Activity Tolerance: Patient tolerated treatment well Patient left: in chair;with call bell/phone within reach;with chair alarm set;with family/visitor present Nurse Communication: Mobility status;Other (comment) (Left O2 off. Nurse present for mobility) PT Visit Diagnosis: Unsteadiness on feet (R26.81);Other abnormalities of gait and mobility (R26.89);Muscle weakness (generalized)  (M62.81)    Time: [id number] PT Time Calculation (min) (ACUTE ONLY): 29 min   Charges:   PT Evaluation $PT Eval Moderate Complexity: 1 Mod PT Treatments $Gait Training: 8-22 mins        [place] Pager [phone] Office [place] 07/01/2020, 12:55 PM

## 2020-07-01 NOTE — Progress Notes (Addendum)
MRI showed no acute abnormality.   B12 level came back low at 221.  CRP is elevated, but no white count. ESR mildly elevated but of equivocal significance given the patient's age.   Ammonia level is normal.   CK mildly elevated at 399. Troponin elevated at 21.   Mg level is low at 1.6.  A/R: - Correct Mg level -  B12 supplementation 1000 mcg subq daily for 5 doses has been ordered. Following first 5 days, would then switch to qweek for 4 doses, then give qmonth thereaftter - Thiamine level ordered. After it has been drawn, consider empiric supplementation.  - Vitamin D level ordered.  - Will follow up on EEG results.   Electronically signed: Dr. Kerney Elbe

## 2020-07-02 ENCOUNTER — Inpatient Hospital Stay (HOSPITAL_COMMUNITY): Payer: Medicare HMO

## 2020-07-02 ENCOUNTER — Encounter (HOSPITAL_COMMUNITY): Payer: Self-pay | Admitting: Internal Medicine

## 2020-07-02 DIAGNOSIS — M25531 Pain in right wrist: Secondary | ICD-10-CM

## 2020-07-02 DIAGNOSIS — M25532 Pain in left wrist: Secondary | ICD-10-CM

## 2020-07-02 DIAGNOSIS — R531 Weakness: Secondary | ICD-10-CM | POA: Diagnosis not present

## 2020-07-02 DIAGNOSIS — M25561 Pain in right knee: Secondary | ICD-10-CM

## 2020-07-02 DIAGNOSIS — I10 Essential (primary) hypertension: Secondary | ICD-10-CM | POA: Diagnosis not present

## 2020-07-02 DIAGNOSIS — G9341 Metabolic encephalopathy: Principal | ICD-10-CM

## 2020-07-02 DIAGNOSIS — M25511 Pain in right shoulder: Secondary | ICD-10-CM | POA: Diagnosis not present

## 2020-07-02 LAB — BASIC METABOLIC PANEL
Anion gap: 12 (ref 5–15)
BUN: 22 mg/dL (ref 8–23)
CO2: 23 mmol/L (ref 22–32)
Calcium: 8.4 mg/dL — ABNORMAL LOW (ref 8.9–10.3)
Chloride: 101 mmol/L (ref 98–111)
Creatinine, Ser: 0.96 mg/dL (ref 0.61–1.24)
GFR, Estimated: >60 mL/min (ref >60)
Glucose, Bld: 123 mg/dL — ABNORMAL HIGH (ref 70–99)
Potassium: 3.7 mmol/L (ref 3.5–5.1)
Sodium: 136 mmol/L (ref 135–145)

## 2020-07-02 LAB — CBC WITH DIFFERENTIAL/PLATELET
Abs Immature Granulocytes: 0.03 10*3/uL (ref 0.00–0.07)
Basophils Absolute: 0 10*3/uL (ref 0.0–0.1)
Basophils Relative: 0 %
Eosinophils Absolute: 0.2 10*3/uL (ref 0.0–0.5)
Eosinophils Relative: 2 %
HCT: 31.3 % — ABNORMAL LOW (ref 39.0–52.0)
Hemoglobin: 10.5 g/dL — ABNORMAL LOW (ref 13.0–17.0)
Immature Granulocytes: 0 %
Lymphocytes Relative: 15 %
Lymphs Abs: 1.2 10*3/uL (ref 0.7–4.0)
MCH: 33.7 pg (ref 26.0–34.0)
MCHC: 33.5 g/dL (ref 30.0–36.0)
MCV: 100.3 fL — ABNORMAL HIGH (ref 80.0–100.0)
Monocytes Absolute: 0.8 10*3/uL (ref 0.1–1.0)
Monocytes Relative: 10 %
Neutro Abs: 5.7 10*3/uL (ref 1.7–7.7)
Neutrophils Relative %: 73 %
Platelets: 176 10*3/uL (ref 150–400)
RBC: 3.12 MIL/uL — ABNORMAL LOW (ref 4.22–5.81)
RDW: 12.7 % (ref 11.5–15.5)
WBC: 7.9 10*3/uL (ref 4.0–10.5)
nRBC: 0 % (ref 0.0–0.2)

## 2020-07-02 LAB — PROCALCITONIN: Procalcitonin: 0.87 ng/mL

## 2020-07-02 IMAGING — CR DG KNEE 1-2V*R*
2 series · 2 of 2 positions shown · non-contrast
Comparison: None.

CLINICAL DATA: Knee pain

EXAM:
RIGHT KNEE - 1-2 VIEW

[knee ap]
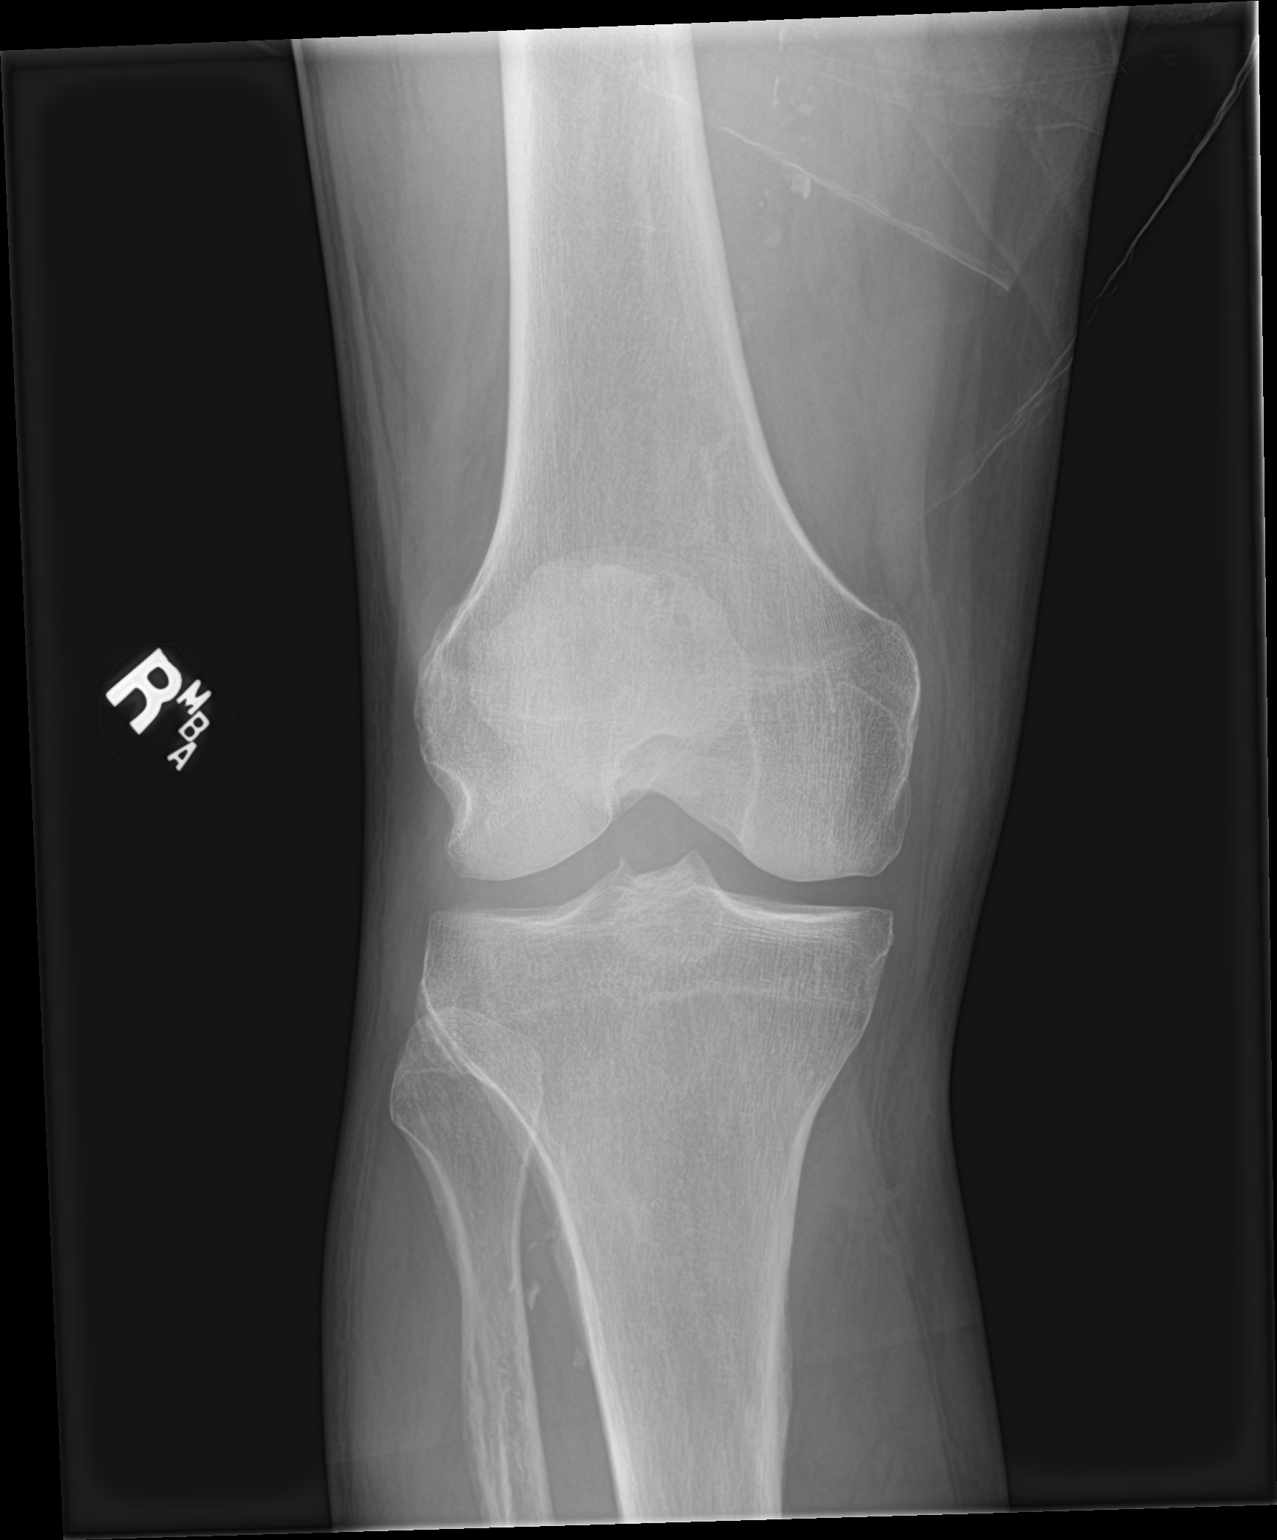

[knee lat]
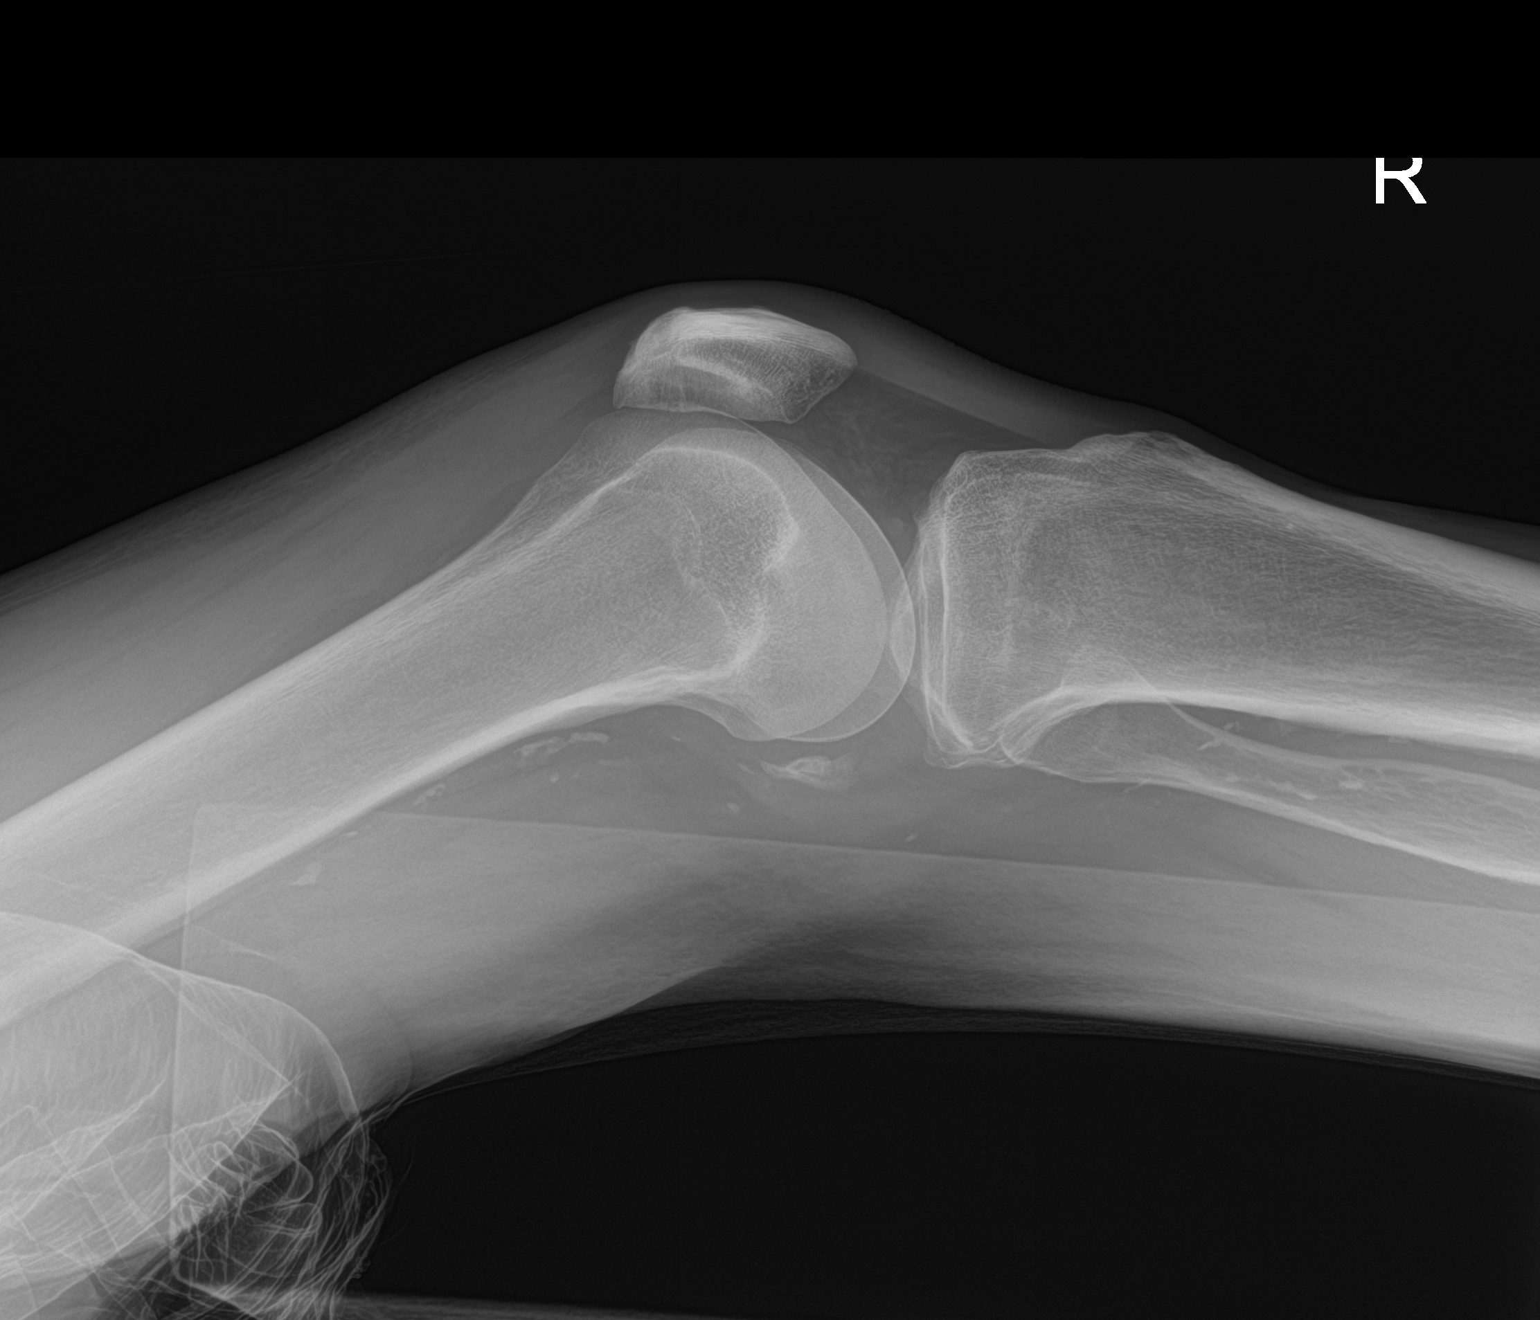

[2 of 2 positions shown; findings below may reference images not displayed]

FINDINGS: Normal alignment without acute osseous finding or fracture.
Preserved joint spaces. No significant arthropathy. Negative for
effusion. Peripheral atherosclerosis noted. No focal soft tissue
abnormality.
IMPRESSION: No acute osseous finding.

Peripheral atherosclerosis.

## 2020-07-02 IMAGING — MR MR HEAD WO/W CM
5 of 12 series · 24 of 48 positions shown · IV contrast (gadavist)
Comparison: Head CT [DATE].

CLINICAL DATA: Delirium.

EXAM:
MRI HEAD WITHOUT AND WITH CONTRAST
TECHNIQUE: Multiplanar, multiecho pulse sequences of the brain and surrounding
structures were obtained without and with intravenous contrast.
CONTRAST:  7.5mL GADAVIST GADOBUTROL 1 MMOL/ML IV SOLN

[Series 3: DWI · axial · 3.0mm · 0.94mm/px · z∈[-142,+5]mm · 9 of 100 slices shown (1 of 2)]
[im 1/100]
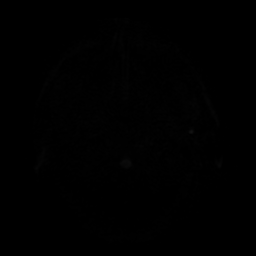
[im 13/100]
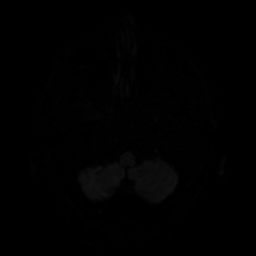
[im 25/100]
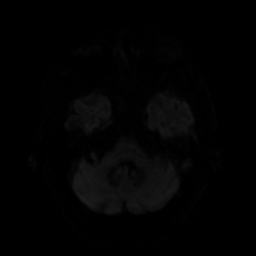
[im 38/100]
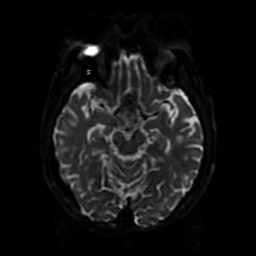
[im 50/100]
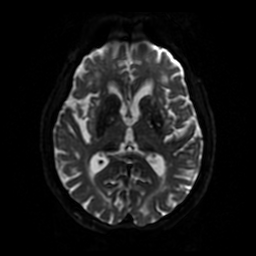
[im 62/100]
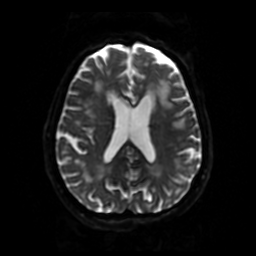
[im 75/100]
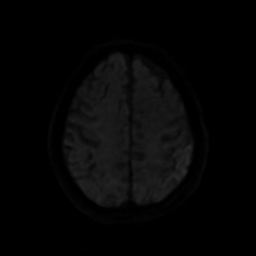
[im 87/100]
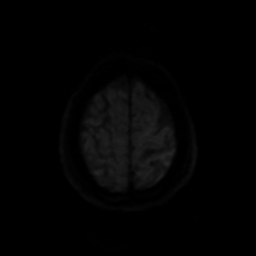
[im 100/100]
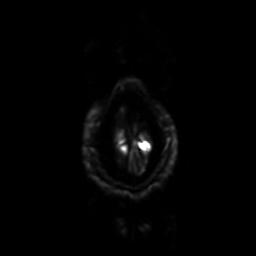

[Series 4: DWI · coronal · 4.0mm · 0.94mm/px · 6 of 74 slices shown (2 of 2)]
[im 1/74]
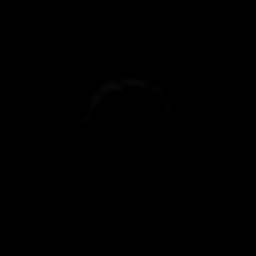
[im 15/74]
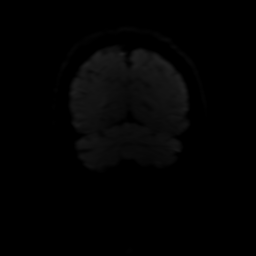
[im 30/74]
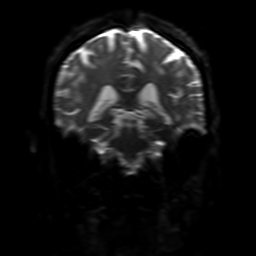
[im 44/74]
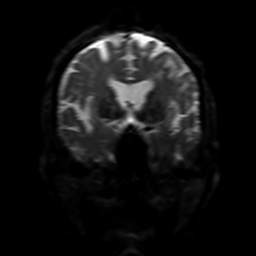
[im 59/74]
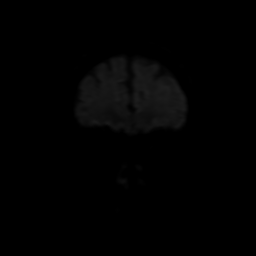
[im 74/74]
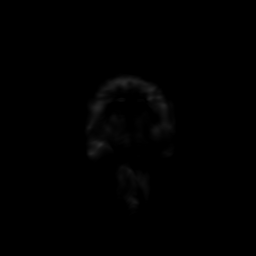

[Series 5: FLAIR · sagittal · 5.0mm · 0.23mm/px · 3 of 25 slices shown (1 of 2)]
[im 1/25]
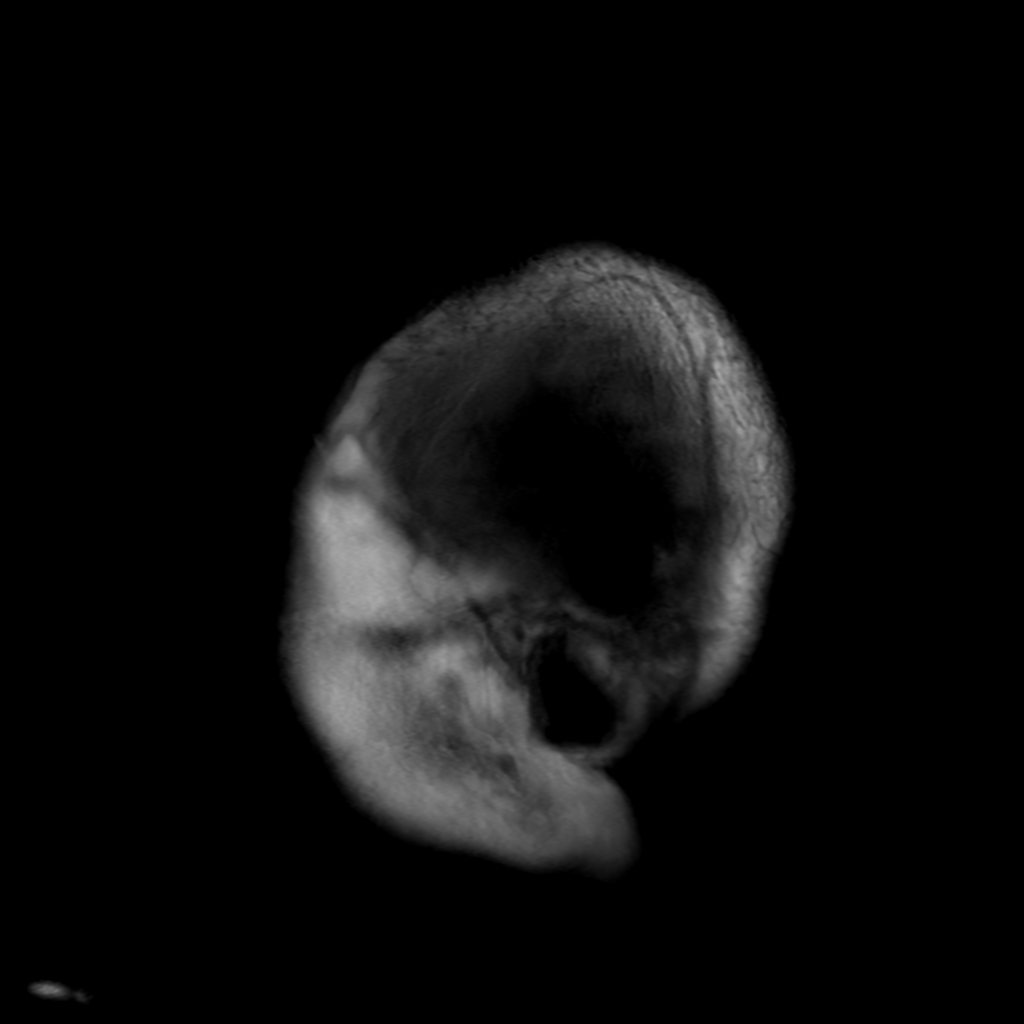
[im 13/25]
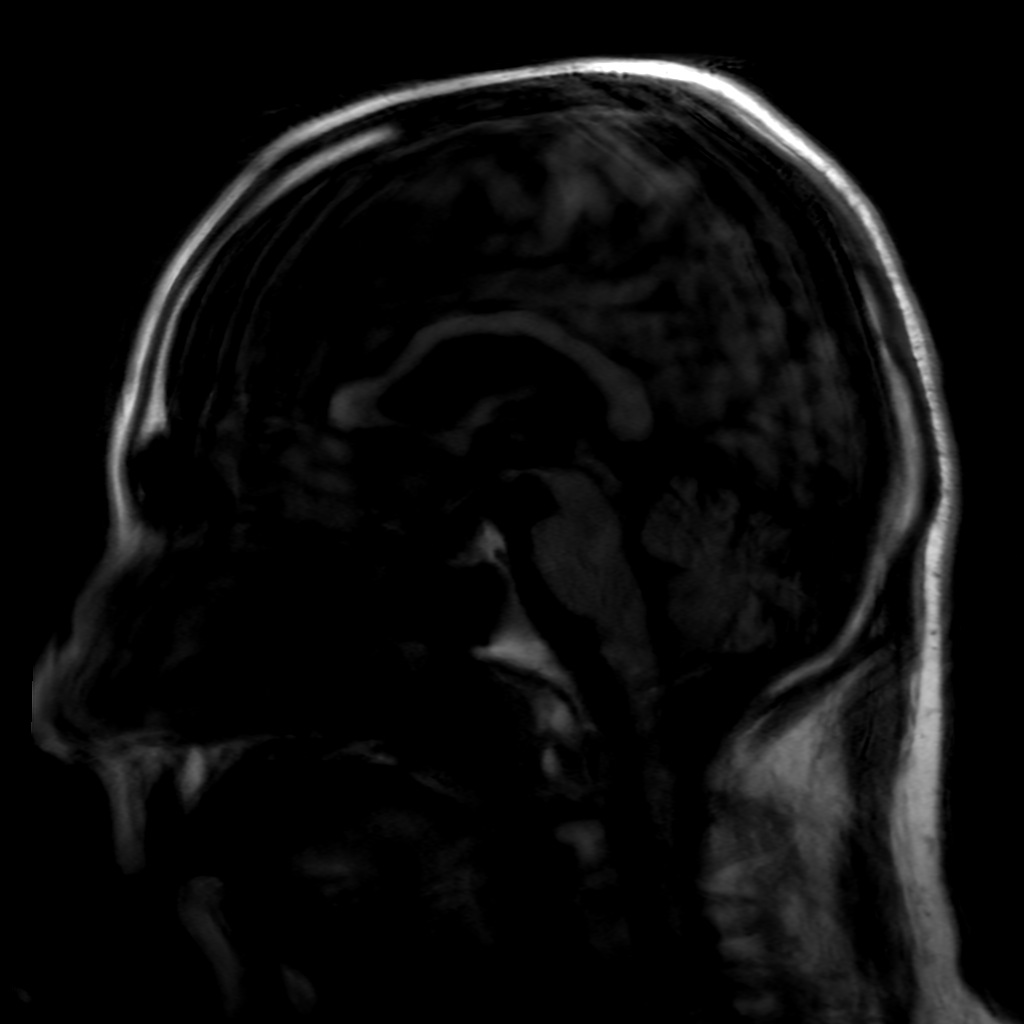
[im 25/25]
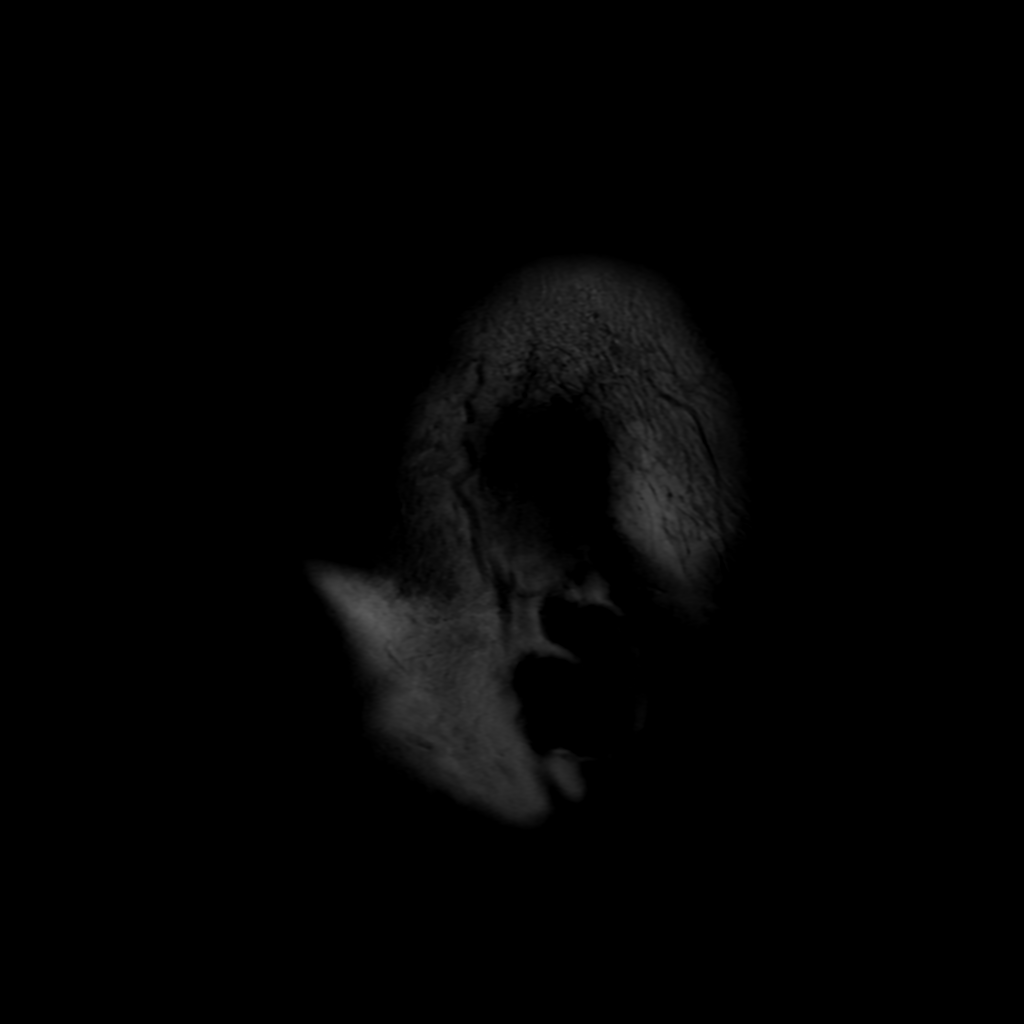

[Series 7: FLAIR · axial · 5.0mm · 0.47mm/px · z∈[-144,+5]mm · 3 of 26 slices shown (2 of 2)]
[im 1/26]
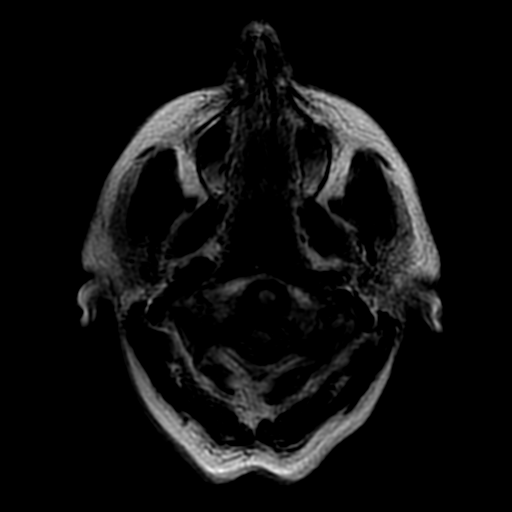
[im 13/26]
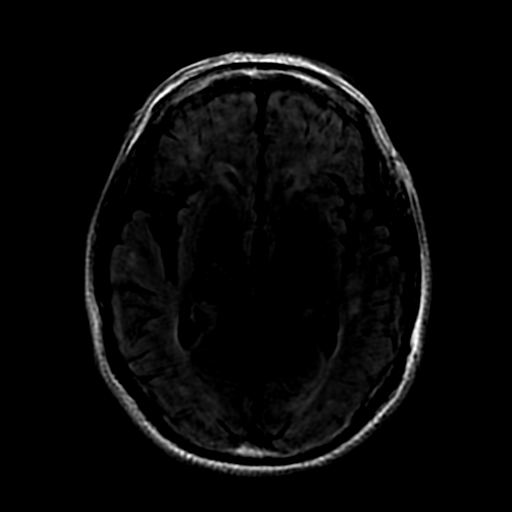
[im 26/26]
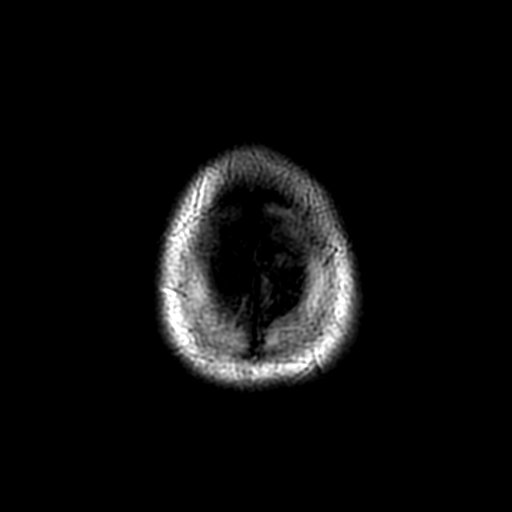

[Series 350: ADC · axial · 3.0mm · 0.94mm/px · z∈[-142,-70]mm · 3 of 50 slices shown]
[im 1/50]
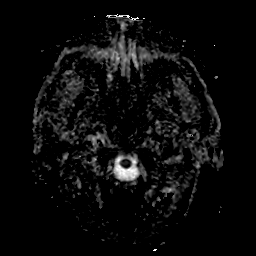
[im 13/50]
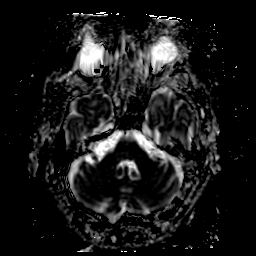
[im 25/50]
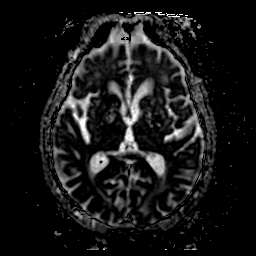

[24 of 48 positions shown; findings below may reference images not displayed]

FINDINGS: The study is significantly degraded by motion.

Brain: Increased signal in the diffusion-weighted sequence in the
left parietal lobe likely represents artifact related to motion as
it is not reproduced on coronal images. No acute infarction,
hemorrhage, hydrocephalus, extra-axial collection or mass lesion.
Scattered and confluent foci of T2 hyperintensity are seen within
the white matter of the cerebral hemispheres, nonspecific, most
likely related to chronic small vessel ischemia.

Vascular: Normal flow voids.

Skull and upper cervical spine: Normal marrow signal.

Sinuses/Orbits: Negative.
IMPRESSION: 1. Motion degraded study.
2. No acute intracranial abnormality.
3. Advanced chronic small vessel ischemia.

## 2020-07-02 MED ORDER — DEXTROSE 5 % IV SOLN
800.0000 mg | Freq: Three times a day (TID) | INTRAVENOUS | Status: DC
  Administered 2020-07-02 – 2020-07-03 (×4): 800 mg via INTRAVENOUS
  Filled 2020-07-02 (×6): qty 16

## 2020-07-02 MED ORDER — THIAMINE HCL 100 MG/ML IJ SOLN
100.0000 mg | Freq: Every day | INTRAMUSCULAR | Status: DC
  Administered 2020-07-10: 100 mg via INTRAVENOUS
  Filled 2020-07-02: qty 2

## 2020-07-02 MED ORDER — THIAMINE HCL 100 MG/ML IJ SOLN
500.0000 mg | Freq: Three times a day (TID) | INTRAVENOUS | Status: AC
  Administered 2020-07-02 – 2020-07-03 (×5): 500 mg via INTRAVENOUS
  Filled 2020-07-02 (×6): qty 5

## 2020-07-02 MED ORDER — GADOBUTROL 1 MMOL/ML IV SOLN
7.5000 mL | Freq: Once | INTRAVENOUS | Status: AC | PRN
  Administered 2020-07-02: 7.5 mL via INTRAVENOUS

## 2020-07-02 MED ORDER — THIAMINE HCL 100 MG/ML IJ SOLN
250.0000 mg | Freq: Every day | INTRAVENOUS | Status: DC
  Administered 2020-07-04 – 2020-07-09 (×5): 250 mg via INTRAVENOUS
  Filled 2020-07-02 (×6): qty 2.5

## 2020-07-02 MED ORDER — LORAZEPAM 2 MG/ML IJ SOLN
1.0000 mg | INTRAMUSCULAR | Status: DC | PRN
  Administered 2020-07-02 – 2020-07-03 (×3): 1 mg via INTRAVENOUS
  Filled 2020-07-02 (×3): qty 1

## 2020-07-02 MED ORDER — SODIUM CHLORIDE 0.9 % IV SOLN: INTRAVENOUS | Status: DC | PRN

## 2020-07-02 NOTE — Plan of Care (Signed)

## 2020-07-02 NOTE — Progress Notes (Signed)
NEUROLOGY CONSULTATION PROGRESS NOTE   Date of service: July 02, 2020 Patient Name: Patrick Macias MRN:  [id number] DOB:  [birth year]  Brief HPI   Patrick Macias is a 80 y.o. male with hx of hypertension, hyperlipidemia, cognitive decline, admitted with relatively acute onset of altered mental status with rash, fever and whole body ache.He was recently admitted with AMS, was doing pretty good at discharge. Symptoms seem to a developed on 06/29/20 and noted by family on return from his office.  Wife over the phone endorses rash on the forearm and on the back which has now resolved. He is otherwise very funtiontal and was doing everything by himself 2 weeks ago.   Interval Hx   Continues to be encephalopathic. He is awake, alert, smiles, engages in a conversation but seems to perseverate on words, unable to identify his son who is at the bedside. Was agitated yesterday. Endorses pain in his left wrist and son reports that his joints are swollen.  Vitals   Vitals:   07/01/20 1605 07/01/20 1940 07/02/20 0624 07/02/20 0745  BP: 123/60 106/68 132/67 140/69  Pulse: 83 75 76 75  Resp: _0 (!) 21  Temp: 99.7 F (37.6 C) 98.8 F (37.1 C) 98.9 F (37.2 C) 98.6 F (37 C)  TempSrc: Oral Oral Oral Oral  SpO2: 96% 100% 98% 93%  Weight:      Height:         Body mass index is 25.34 kg/m.  Physical Exam   General: Laying comfortably in bed; in no acute distress. HENT: Normal oropharynx and mucosa. Normal external appearance of ears and nose. Neck: Supple, no pain or tenderness CV: No JVD. No peripheral edema. Pulmonary: Symmetric Chest rise. Normal respiratory effort. Abdomen: Soft to touch, non-tender. Ext: No cyanosis, edema, or deformity Skin: No rash. Normal palpation of skin.  Musculoskeletal: Normal digits and nails by inspection. No clubbing.  Neurologic Examination  Mental status/Cognition: Alert, awake, oriented to self, but not to place, month and year, poor  attention. Speech/language: Fluent, comprehension intact to very simple commands, object naming intact to most but not all objects(unable to identify a glove), repetition intact. Appears to intermittently mumble and perseverate. Cranial nerves:   CN II Pupils equal and reactive to light, no VF deficits   CN III,IV,VI EOM intact, no gaze preference or deviation, no nystagmus   CN V normal sensation in V1, V2, and V3 segments bilaterally   CN VII no asymmetry, no nasolabial fold flattening   CN VIII normal hearing to speech   CN IX & X normal palatal elevation, no uvular deviation   CN XI 5/5 head turn and 5/5 shoulder shrug bilaterally   CN XII midline tongue protrusion   Motor:  Muscle bulk: normal, tone normal.  Unable to do a detailed strength exam due to encephalopathy and pain but moves all extremities to commands and spontaneously with atleast antigravity strength in all muscle groups.  Sensation:  Light touch Intact to light touch throughout.   Pin prick    Temperature    Vibration   Proprioception    Coordination/Complex Motor:  - Finger to Nose intact BL - Heel to shin unable to get him to do due to encephalopathy.  Labs   Basic Metabolic Panel:  Lab Results  Component Value Date   NA 136 07/02/2020   K 3.7 07/02/2020   CO2 23 07/02/2020   GLUCOSE 123 (H) 07/02/2020   BUN 22 07/02/2020   CREATININE  0.96 07/02/2020   CALCIUM 8.4 (L) 07/02/2020   GFRNONAA >60 07/02/2020   GFRAA  03/21/2008    >60        The eGFR has been calculated using the MDRD equation. This calculation has not been validated in all clinical   HbA1c:  Lab Results  Component Value Date   HGBA1C 5.1 06/02/2020   LDL:  Lab Results  Component Value Date   LDLCALC 32 06/02/2020   Urine Drug Screen:     Component Value Date/Time   LABOPIA NONE DETECTED 06/01/2020 1200   COCAINSCRNUR NONE DETECTED 06/01/2020 1200   LABBENZ NONE DETECTED 06/01/2020 1200   AMPHETMU NONE DETECTED  06/01/2020 1200   THCU NONE DETECTED 06/01/2020 1200   LABBARB NONE DETECTED 06/01/2020 1200    Alcohol Level No results found for: ETH No results found for: PHENYTOIN, ZONISAMIDE, LAMOTRIGINE, LEVETIRACETA No results found for: PHENYTOIN, PHENOBARB, VALPROATE, CBMZ  Imaging and Diagnostic studies   CTH without contrast: Atrophy, chronic microvascular disease. No acute intracranial abnormality.  REEG: This study is suggestive of moderate diffuse encephalopathy, nonspecific etiology. No seizures or epileptiform discharges were seen throughout the recording.  Impression   Patrick Macias is a 80 y.o. male admitted with acute onset encephalopathy, fever, rash, joint pains.  Work-up with CT head without contrast with atrophy, chronic microvascular disease but no acute intracranial abnormality.  Routine EEG with moderate diffuse encephalopathy of nonspecific etiology.  No seizures or epileptiform discharges were seen.  Labs with low normal vitamin B12 levels, mildly elevated CK levels to 398.  UA, chest x-ray, blood cultures, procalcitonin, lactate with no clear source of infection.  Currently being treated with acyclovir, cefepime, Flagyl, vancomycin until MRI and LP to rule out CNS infection.  Recommendations  - MRI Brain with and without contrast is pending. - LP is pending. Recommend CSF cell count, differential, protein, glucose, CSF lactate, CSF gram stain and culture. HSV, VZV, enterovirus, EBV, Lyme, Cryptococcus testing. - I ordered serum RF, ANA, ENA, DS-DNA, autoimmune encephalitis panel on CSF and serum. - continue empiric broad psectrum meningitis coverage until MRI and LP. ______________________________________________________________________   Thank you for the opportunity to take part in the care of this patient. If you have any further questions, please contact the neurology consultation attending.  Signed,  [place] Pager Number  [phone]

## 2020-07-02 NOTE — Progress Notes (Addendum)
PT Cancellation Note  Patient Details Name: Patrick Macias MRN: [id number] DOB: [birth year]   Cancelled Treatment:    Reason Eval/Treat Not Completed: Patient at procedure or test/unavailable (MRI).  Lillia Pauls, PT, DPT Acute Rehabilitation Services Pager [phone] Office [phone]    Norval Morton 07/02/2020, 7:51 AM

## 2020-07-02 NOTE — Progress Notes (Addendum)
[facility] PROGRESS NOTE    Progress Note  Patrick Macias  [id number] DOB: Jul 30, 1940 DOA: 06/30/2020 PCP: Crist Infante, MD     Brief Narrative:   Patrick Macias is an 80 y.o. male past medical history of vascular dementia, essential hypertension recently discharged from the hospital for TIA was discharged on aspirin and Plavix, was brought into the ED for fevers and altered mental status for 1 week in the ED he was found afebrile with a CT scan showed no acute findings, chest x-ray was unremarkable electrolytes glucose and BUN were abnormal but not significantly.  Patient was transferred to [facility] for an MRI  Assessment/Plan:   Acute metabolic encephalopathy superimposed on vascular dementia: He has been having temperature of 100.5 repeatedly, he was started empirically on IV vancomycin cefepime and acyclovir. Has remained afebrile since then has no leukocytosis.Culture data has remained negative till date. Neurology was consulted recommended an MRI holding galantamine. EEG showed no signs of seizures moderate diffuse encephalopathy nonspecific etiology. Continue replete B12. Continue aspirin. We are not able to perform an MRI yesterday as he was agitated try again today. There was a concern for encephalitis yesterday but now his mentation this morning is back to baseline he had no further fevers EEG showed no seizures, d/w neuro will perform LP.  Fever of unknown origin: Chest x-ray shows no sign of pneumonia, UA shows no signs of infection. Blood culture urine culture have been negative till date. Multiple imaging were done that did not show an effusion or fractures. His highest temperature was 101.5 he was started empirically on acyclovir, culture data has remained negative till date. He does have a past medical history of gout almost 20 years ago but he has multiarthropathy bilateral wrist elbows left shoulder right knee and right hip.  Also in the differential  will include viral meningitis versus viral encephalitis. There is no effusion in joints to perform arthrocentesis. Discontinue acyclovir we will continue to monitor is in no further fevers.  Polyarticular swelling and erythema: Concerned about gout unlikely cellulitis or septic arthritis, imaging shows no effusion. We will get right wrist and knee x-ray to look for effusion. He was started on naproxen and Protonix for gastric prophylaxis.  Essential hypertension: Holding antihypertensive medication allow permissive hypertension for concerns TIA  DVT prophylaxis: lovenox Family Communication:none Status is: Observation  The patient remains OBS appropriate and will d/c before 2 midnights.  Dispo: The patient is from: Home              Anticipated d/c is to: Home              Anticipated d/c date is: 3 days              Patient currently is not medically stable to d/c.        Code Status:     Code Status Orders  (From admission, onward)         Start     Ordered   06/30/20 2007  Full code  Continuous        06/30/20 2011        Code Status History    Date Active Date Inactive Code Status Order ID Comments User Context   06/01/2020 2213 06/02/2020 2216 Partial Code [id number]  Marcelyn Bruins, MD Inpatient   06/01/2020 2130 06/01/2020 2212 Full Code [id number]  Marcelyn Bruins, MD Inpatient   Advance Care Planning Activity  IV Access:    Peripheral IV   Procedures and diagnostic studies:   CT Head Wo Contrast  Result Date: 06/30/2020 CLINICAL DATA:  Mental status changes EXAM: CT HEAD WITHOUT CONTRAST TECHNIQUE: Contiguous axial images were obtained from the base of the skull through the vertex without intravenous contrast. COMPARISON:  06/01/2020 FINDINGS: Brain: There is atrophy and chronic small vessel disease changes. No acute intracranial abnormality. Specifically, no hemorrhage, hydrocephalus, mass lesion, acute infarction, or significant  intracranial injury. Vascular: No hyperdense vessel or unexpected calcification. Skull: No acute calvarial abnormality. Sinuses/Orbits: No acute findings Other: None IMPRESSION: Atrophy, chronic microvascular disease. No acute intracranial abnormality. Electronically Signed   By: Rolm Baptise M.D.   On: 06/30/2020 12:38   DG Hand 2 View Left  Result Date: 06/30/2020 CLINICAL DATA:  Acute pain EXAM: LEFT HAND - 2 VIEW COMPARISON:  None. FINDINGS: There is no evidence of fracture or dislocation. There is no evidence of arthropathy or other focal bone abnormality. Soft tissues are unremarkable. IMPRESSION: Negative. Electronically Signed   By: Donavan Foil M.D.   On: 06/30/2020 23:19   DG Chest Port 1 View  Result Date: 06/30/2020 CLINICAL DATA:  Hand pain after injury, confusion EXAM: PORTABLE CHEST 1 VIEW COMPARISON:  06/01/2020 chest radiograph. FINDINGS: Stable cardiomediastinal silhouette with normal heart size. No pneumothorax. No pleural effusion. Lungs appear clear, with no acute consolidative airspace disease and no pulmonary edema. IMPRESSION: No active disease. Electronically Signed   By: Ilona Sorrel M.D.   On: 06/30/2020 09:13   DG Shoulder Left Port  Result Date: 06/30/2020 CLINICAL DATA:  Shoulder pain EXAM: LEFT SHOULDER COMPARISON:  None. FINDINGS: Mild AC joint degenerative change. Probable small amount of calcific tendinitis. No fracture or dislocation. IMPRESSION: 1. No acute osseous abnormality. 2. Probable small amount of calcific tendinitis. Electronically Signed   By: Donavan Foil M.D.   On: 06/30/2020 23:22   DG Shoulder Right Port  Result Date: 06/30/2020 CLINICAL DATA:  Acute shoulder pain EXAM: PORTABLE RIGHT SHOULDER COMPARISON:  None. FINDINGS: Mild AC joint degenerative change. No fracture or malalignment, limited positioning on the Y-view. IMPRESSION: No acute osseous abnormality. Electronically Signed   By: Donavan Foil M.D.   On: 06/30/2020 23:21   DG Hand Complete  Right  Result Date: 06/30/2020 CLINICAL DATA:  Right hand pain, possible injury EXAM: RIGHT HAND - COMPLETE 3+ VIEW COMPARISON:  None. FINDINGS: There is no evidence of fracture or dislocation. No evidence of inflammatory arthropathy. Circumscribed lucencies in the heads of the third and fourth metacarpal demonstrate a thin peripheral sclerotic margin. These most likely represent subchondral geodes. Soft tissues are unremarkable. IMPRESSION: Negative. Electronically Signed   By: Jacqulynn Cadet M.D.   On: 06/30/2020 09:23   EEG adult  Result Date: 07/01/2020 Lora Havens, MD     07/01/2020  8:55 PM Patient Name: Patrick Macias MRN: [id number] Epilepsy Attending: Lora Havens Referring Physician/Provider: Dr Rosalin Hawking Date: 07/01/2020 Duration: 22.58 mins Patient history: y.o. male with PMH of HTN, HLD, recent cognitive decline and recent admission for AMS admitted again for AMS, rash, fever, and whole body ache. EEG to evaluate for seizure Level of alertness: asleep AEDs during EEG study: Ativan Technical aspects: This EEG study was done with scalp electrodes positioned according to the 10-20 International system of electrode placement. Electrical activity was acquired at a sampling rate of 500Hz  and reviewed with a high frequency filter of 70Hz  and a low frequency filter of 1Hz . EEG data  were recorded continuously and digitally stored. Description: No posterior dominant rhythm was seen. Sleep was characterized by vertex waves, sleep spindles (12 to 14 Hz), maximal frontocentral region.  EEG showed continuous generalized 3 to 6 Hz theta-delta slowing.  Hyperventilation and photic stimulation were not performed.   ABNORMALITY -Continuous slow, generalized IMPRESSION: This study is suggestive of moderate diffuse encephalopathy, nonspecific etiology. No seizures or epileptiform discharges were seen throughout the recording. [place]   DG HIP PORT UNILAT WITH PELVIS 1V LEFT  Result Date:  06/30/2020 CLINICAL DATA:  Acute hip pain EXAM: DG HIP (WITH OR WITHOUT PELVIS) 1V PORT LEFT COMPARISON:  None. FINDINGS: There is no evidence of hip fracture or dislocation. There is no evidence of arthropathy or other focal bone abnormality. IMPRESSION: Negative. Electronically Signed   By: Donavan Foil M.D.   On: 06/30/2020 23:20   DG HIP PORT UNILAT WITH PELVIS 1V RIGHT  Result Date: 06/30/2020 CLINICAL DATA:  Acute hip pain EXAM: DG HIP (WITH OR WITHOUT PELVIS) 1V PORT RIGHT COMPARISON:  None. FINDINGS: There is no evidence of hip fracture or dislocation. There is no evidence of arthropathy or other focal bone abnormality. IMPRESSION: Negative. Electronically Signed   By: Donavan Foil M.D.   On: 06/30/2020 23:20     Medical Consultants:    None.  Anti-Infectives:   Vanc, cefepiem and falgyl  Subjective:    [place] significantly more awake this morning, we were able to have a conversation his son is at bedside and relates that his mentation is close to baseline, relating that he is hungry.  He also relates that his pain is much better than yesterday.  Objective:    Vitals:   07/01/20 1605 07/01/20 1940 07/02/20 0624 07/02/20 0745  BP: 123/60 106/68 132/67 140/69  Pulse: 83 75 76 75  Resp: 17 18 18  (!) 21  Temp: 99.7 F (37.6 C) 98.8 F (37.1 C) 98.9 F (37.2 C) 98.6 F (37 C)  TempSrc: Oral Oral Oral Oral  SpO2: 96% 100% 98% 93%  Weight:      Height:       SpO2: 93 % O2 Flow Rate (L/min): 2 L/min   Intake/Output Summary (Last 24 hours) at 07/02/2020 0803 Last data filed at 07/02/2020 0500 Gross per 24 hour  Intake 1981.33 ml  Output 175 ml  Net 1806.33 ml   Filed Weights   06/30/20 0830 07/01/20 0350  Weight: 77.1 kg 82.4 kg    Exam: General exam: In no acute distress. Respiratory system: Good air movement and clear to auscultation. Cardiovascular system: S1 & S2 heard, RRR. No JVD. Gastrointestinal system: Abdomen is nondistended, soft and  nontender.  Extremities: Swelling of bilateral hip with erythema and warmth to touch left knee painful left hip on external rotation Skin: No rashes, lesions or ulcers Psychiatry: Judgement and insight appear normal. Mood & affect appropriate.    Data Reviewed:    Labs: Basic Metabolic Panel: Recent Labs  Lab 06/30/20 0831 06/30/20 2122 07/01/20 0323 07/02/20 0417  NA 134*  --  135 136  K 3.6  --  3.7 3.7  CL 102  --  103 101  CO2 22  --  22 23  GLUCOSE 203*  --  116* 123*  BUN 27*  --  17 22  CREATININE 1.13  --  1.02 0.96  CALCIUM 8.6*  --  8.4* 8.4*  MG  --  1.6*  --   --   PHOS  --  3.0  --   --    GFR Estimated Creatinine Clearance: 68.6 mL/min (by C-G formula based on SCr of 0.96 mg/dL). Liver Function Tests: Recent Labs  Lab 06/30/20 0831 07/01/20 0323  AST 24 24  ALT 16 15  ALKPHOS 29* 26*  BILITOT 1.5* 1.6*  PROT 6.7 5.8*  ALBUMIN 3.6 2.9*   No results for input(s): LIPASE, AMYLASE in the last 168 hours. Recent Labs  Lab 06/30/20 2122  AMMONIA 28   Coagulation profile Recent Labs  Lab 06/30/20 0831  INR 1.2   COVID-19 Labs  Recent Labs    06/30/20 0831  CRP 12.6*    Lab Results  Component Value Date   SARSCOV2NAA NEGATIVE 06/30/2020   [place] NEGATIVE 06/01/2020    CBC: Recent Labs  Lab 06/30/20 0831 07/01/20 0323 07/02/20 0417  WBC 7.1 9.1 7.9  NEUTROABS 5.6  --  5.7  HGB 10.9* 10.8* 10.5*  HCT 31.7* 30.9* 31.3*  MCV 99.7 98.7 100.3*  PLT 181 172 176   Cardiac Enzymes: Recent Labs  Lab 06/30/20 2122  CKTOTAL 398*   BNP (last 3 results) No results for input(s): PROBNP in the last 8760 hours. CBG: Recent Labs  Lab 06/30/20 1544  GLUCAP 114*   D-Dimer: No results for input(s): DDIMER in the last 72 hours. Hgb A1c: No results for input(s): HGBA1C in the last 72 hours. Lipid Profile: No results for input(s): CHOL, HDL, LDLCALC, TRIG, CHOLHDL, LDLDIRECT in the last 72 hours. Thyroid function studies: Recent  Labs    06/30/20 2122  TSH 0.873   Anemia work up: Recent Labs    06/30/20 2122  VITAMINB12 221  FOLATE 10.1   Sepsis Labs: Recent Labs  Lab 06/30/20 0831 06/30/20 1035 07/01/20 0323 07/01/20 1437 07/02/20 0417  PROCALCITON  --   --   --  0.82 0.87  WBC 7.1  --  9.1  --  7.9  LATICACIDVEN 2.1* 1.2  --   --   --    Microbiology Recent Results (from the past 240 hour(s))  Blood Culture (routine x 2)     Status: None (Preliminary result)   Collection Time: 06/30/20  8:31 AM   Specimen: BLOOD LEFT ARM  Result Value Ref Range Status   Specimen Description   Final    BLOOD LEFT ARM Performed at [facility], [address] [place], [place] [address]    Special Requests   Final    BOTTLES DRAWN AEROBIC AND ANAEROBIC Blood Culture adequate volume Performed at [facility], [address] [place], [place] [address]    Culture   Final    NO GROWTH < 24 HOURS Performed at [facility], [place] [address] [place], [place] [address]    Report Status PENDING  Incomplete  Urine culture     Status: None   Collection Time: 06/30/20  8:32 AM   Specimen: In/Out Cath Urine  Result Value Ref Range Status   Specimen Description   Final    IN/OUT CATH URINE Performed at [facility], [address] [place], [place] [address]    Special Requests   Final    NONE Performed at [facility], [address] [place], [place] [address]    Culture   Final    NO GROWTH Performed at [facility], [place] [address], [place], [place] [address]    Report Status 07/01/2020 FINAL  Final  Blood Culture (routine x 2)     Status: None (Preliminary result)   Collection Time: 06/30/20  9:00 AM   Specimen: BLOOD LEFT HAND  Result Value Ref Range Status   Specimen Description   Final    BLOOD LEFT HAND Performed at [facility], [address] [place], [place] [address]    Special Requests   Final    BOTTLES DRAWN AEROBIC AND  ANAEROBIC Blood Culture adequate volume Performed at [facility], [address] [place], [place] [address]    Culture   Final    NO GROWTH < 24 HOURS Performed at [facility], [address], [place], [place] [address]    Report Status PENDING  Incomplete  Resp Panel by RT-PCR (Flu A&B, Covid) Nasopharyngeal Swab     Status: None   Collection Time: 06/30/20 10:34 AM   Specimen: Nasopharyngeal Swab; Nasopharyngeal(NP) swabs in vial transport medium  Result Value Ref Range Status   SARS Coronavirus 2 by RT PCR NEGATIVE NEGATIVE Final    Comment: (NOTE) SARS-CoV-2 target nucleic acids are NOT DETECTED.  The SARS-CoV-2 RNA is generally detectable in upper respiratory specimens during the acute phase of infection. The lowest concentration of SARS-CoV-2 viral copies this assay can detect is 138 copies/mL. A negative result does not preclude SARS-Cov-2 infection and should not be used as the sole basis for treatment or other patient management decisions. A negative result may occur with  improper specimen collection/handling, submission of specimen other than nasopharyngeal swab, presence of viral mutation(s) within the areas targeted by this assay, and inadequate number of viral copies(<138 copies/mL). A negative result must be combined with clinical observations, patient history, and epidemiological information. The expected result is Negative.  Fact Sheet for Patients:  BloggerCourse.com  Fact Sheet for Healthcare Providers:  SeriousBroker.it  This test is no t yet approved or cleared by the [place] FDA and  has been authorized for detection and/or diagnosis of SARS-CoV-2 by FDA under an Emergency Use Authorization (EUA). This EUA will remain  in effect (meaning this test can be used) for the duration of the COVID-19 declaration under Section 564(b)(1) of the Act, 21 U.S.C.section 360bbb-3(b)(1), unless  the authorization is terminated  or revoked sooner.       Influenza A by PCR NEGATIVE NEGATIVE Final   Influenza B by PCR NEGATIVE NEGATIVE Final    Comment: (NOTE) The Xpert Xpress SARS-CoV-2/FLU/RSV plus assay is intended as an aid in the diagnosis of influenza from Nasopharyngeal swab specimens and should not be used as a sole basis for treatment. Nasal washings and aspirates are unacceptable for Xpert Xpress SARS-CoV-2/FLU/RSV testing.  Fact Sheet for Patients: BloggerCourse.com  Fact Sheet for Healthcare Providers: SeriousBroker.it  This test is not yet approved or cleared by the [place] FDA and has been authorized for detection and/or diagnosis of SARS-CoV-2 by FDA under an Emergency Use Authorization (EUA). This EUA will remain in effect (meaning this test can be used) for the duration of the COVID-19 declaration under Section 564(b)(1) of the Act, 21 U.S.C. section 360bbb-3(b)(1), unless the authorization is terminated or revoked.  Performed at [facility], [address] [place], [place] [address]      Medications:   . aspirin EC  81 mg Oral Daily  . cyanocobalamin  1,000 mcg Intramuscular Daily   Followed by  . [START ON 07/12/2020] cyanocobalamin  1,000 mcg Intramuscular Q Thu   Followed by  . [  START ON 08/30/2020] cyanocobalamin  1,000 mcg Intramuscular Q28 days  . enoxaparin (LOVENOX) injection  40 mg Subcutaneous Q24H  . naproxen  500 mg Oral BID WC  . pantoprazole (PROTONIX) IV  40 mg Intravenous Q12H  . rosuvastatin  20 mg Oral QHS   Continuous Infusions: . sodium chloride 100 mL/hr at 07/02/20 0558  . acyclovir 800 mg (07/02/20 0604)  . ceFEPime (MAXIPIME) IV 2 g (07/01/20 2201)  . vancomycin 750 mg (07/02/20 0221)      LOS: 1 day   Charlynne Cousins  [facility]  07/02/2020, 8:03 AM

## 2020-07-02 NOTE — Progress Notes (Signed)
OT Cancellation Note  Patient Details Name: Patrick Macias MRN: [id number] DOB: 1940-07-05   Cancelled Treatment:    Reason Eval/Treat Not Completed: Medical issues which prohibited therapy (Pt at lumbar puncture and will require bedrest after. OT to continue to follow for OT evaluation.)   Flora Lipps, OTR/L Acute Rehabilitation Services Pager: [phone] Office: [phone]   Abygail Galeno C 07/02/2020, 8:11 AM

## 2020-07-03 ENCOUNTER — Inpatient Hospital Stay (HOSPITAL_COMMUNITY): Payer: Medicare HMO

## 2020-07-03 DIAGNOSIS — M25561 Pain in right knee: Secondary | ICD-10-CM | POA: Diagnosis not present

## 2020-07-03 DIAGNOSIS — I10 Essential (primary) hypertension: Secondary | ICD-10-CM | POA: Diagnosis not present

## 2020-07-03 DIAGNOSIS — M25532 Pain in left wrist: Secondary | ICD-10-CM | POA: Diagnosis not present

## 2020-07-03 DIAGNOSIS — M25511 Pain in right shoulder: Secondary | ICD-10-CM | POA: Diagnosis not present

## 2020-07-03 LAB — CBC WITH DIFFERENTIAL/PLATELET
Abs Immature Granulocytes: 0.02 10*3/uL (ref 0.00–0.07)
Basophils Absolute: 0 10*3/uL (ref 0.0–0.1)
Basophils Relative: 0 %
Eosinophils Absolute: 0.3 10*3/uL (ref 0.0–0.5)
Eosinophils Relative: 5 %
HCT: 29.1 % — ABNORMAL LOW (ref 39.0–52.0)
Hemoglobin: 9.6 g/dL — ABNORMAL LOW (ref 13.0–17.0)
Immature Granulocytes: 0 %
Lymphocytes Relative: 14 %
Lymphs Abs: 0.9 10*3/uL (ref 0.7–4.0)
MCH: 33.2 pg (ref 26.0–34.0)
MCHC: 33 g/dL (ref 30.0–36.0)
MCV: 100.7 fL — ABNORMAL HIGH (ref 80.0–100.0)
Monocytes Absolute: 0.5 10*3/uL (ref 0.1–1.0)
Monocytes Relative: 8 %
Neutro Abs: 4.6 10*3/uL (ref 1.7–7.7)
Neutrophils Relative %: 73 %
Platelets: 209 10*3/uL (ref 150–400)
RBC: 2.89 MIL/uL — ABNORMAL LOW (ref 4.22–5.81)
RDW: 12.6 % (ref 11.5–15.5)
WBC: 6.3 10*3/uL (ref 4.0–10.5)
nRBC: 0 % (ref 0.0–0.2)

## 2020-07-03 LAB — BASIC METABOLIC PANEL
Anion gap: 12 (ref 5–15)
BUN: 22 mg/dL (ref 8–23)
CO2: 21 mmol/L — ABNORMAL LOW (ref 22–32)
Calcium: 8.2 mg/dL — ABNORMAL LOW (ref 8.9–10.3)
Chloride: 106 mmol/L (ref 98–111)
Creatinine, Ser: 0.93 mg/dL (ref 0.61–1.24)
GFR, Estimated: >60 mL/min (ref >60)
Glucose, Bld: 122 mg/dL — ABNORMAL HIGH (ref 70–99)
Potassium: 3.3 mmol/L — ABNORMAL LOW (ref 3.5–5.1)
Sodium: 139 mmol/L (ref 135–145)

## 2020-07-03 LAB — CSF CELL COUNT WITH DIFFERENTIAL
RBC Count, CSF: 2 /mm3 — ABNORMAL HIGH
Tube #: 1
WBC, CSF: 1 /mm3 (ref 0–5)

## 2020-07-03 LAB — HEPATITIS PANEL, ACUTE
HCV Ab: NONREACTIVE
Hep A IgM: NONREACTIVE
Hep B C IgM: NONREACTIVE
Hepatitis B Surface Ag: NONREACTIVE

## 2020-07-03 LAB — ANTIEXTRACTABLE NUCLEAR AG
ENA SM Ab Ser-aCnc: <0.2 AI (ref 0.0–0.9)
Ribonucleic Protein: <0.2 AI (ref 0.0–0.9)

## 2020-07-03 LAB — B. BURGDORFI ANTIBODIES: B burgdorferi Ab IgG+IgM: <0.91 {ISR} (ref 0.00–0.90)

## 2020-07-03 LAB — ANTINUCLEAR ANTIBODIES, IFA: ANA Ab, IFA: NEGATIVE

## 2020-07-03 LAB — GLUCOSE, CSF: Glucose, CSF: 67 mg/dL (ref 40–70)

## 2020-07-03 LAB — ANTI-DNA ANTIBODY, DOUBLE-STRANDED: ds DNA Ab: 1 IU/mL (ref 0–9)

## 2020-07-03 LAB — PROCALCITONIN: Procalcitonin: 0.45 ng/mL

## 2020-07-03 LAB — PROTEIN, CSF: Total  Protein, CSF: 41 mg/dL (ref 15–45)

## 2020-07-03 LAB — CRYPTOCOCCAL ANTIGEN, CSF: Crypto Ag: NEGATIVE

## 2020-07-03 LAB — RHEUMATOID FACTOR: Rheumatoid fact SerPl-aCnc: 30.5 IU/mL — ABNORMAL HIGH (ref <14.0)

## 2020-07-03 IMAGING — RF DG SPINAL PUNCT LUMBAR DIAG WITH FL CT GUIDANCE
1 series · 1 of 1 positions shown · non-contrast
Comparison: none

CLINICAL DATA: Mental status changes.

[Series 1: cp_standard · 0.17mm/px · 1 of 1 slices shown]
[im 1/1]
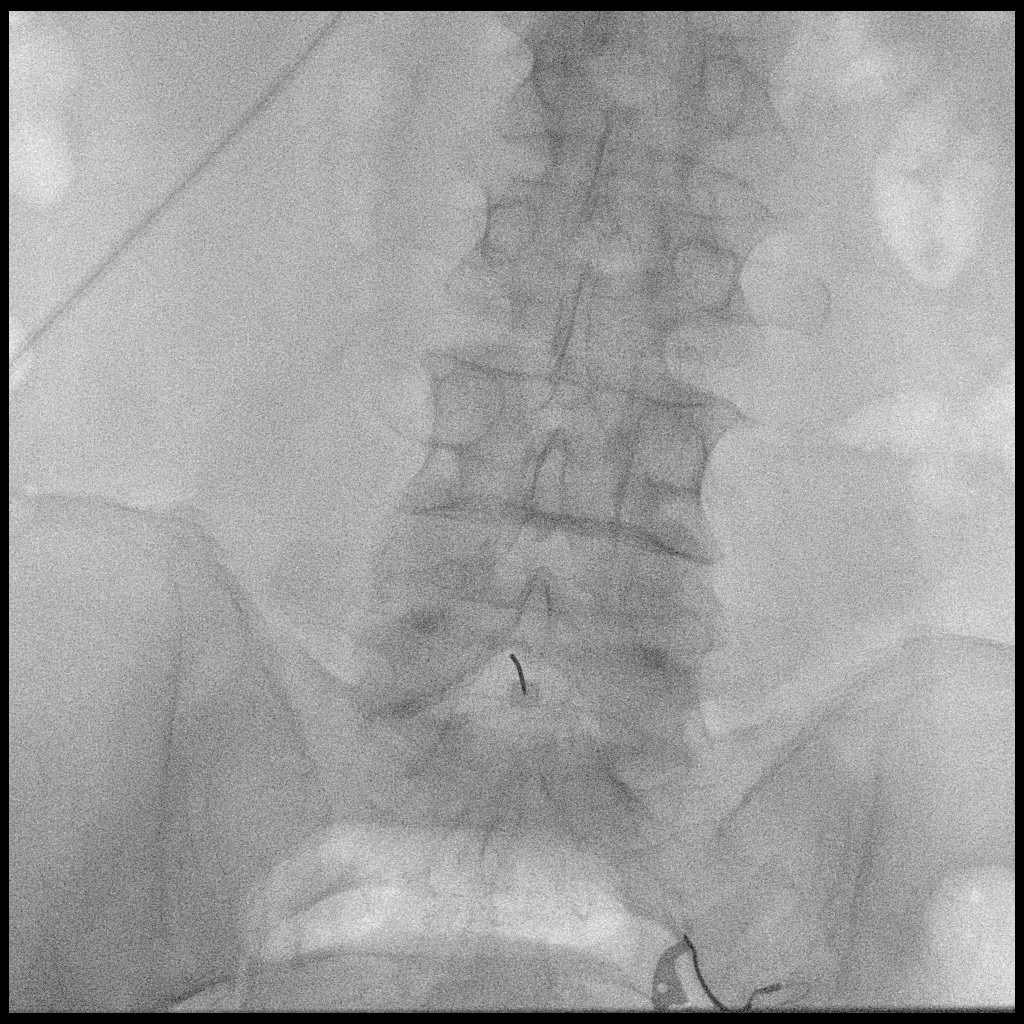

[1 of 1 positions shown; findings below may reference images not displayed]

EXAM:
DIAGNOSTIC LUMBAR PUNCTURE UNDER FLUOROSCOPIC GUIDANCE

FLUOROSCOPY TIME:  Fluoroscopy Time:  0 minutes and 18 seconds.

Radiation Exposure Index (if provided by the fluoroscopic device):
8.8 mGy

Number of Acquired Spot Images: 0

PROCEDURE:
Informed consent was obtained from the patient prior to the
procedure, including potential complications of bleeding, infection,
headache, allergy, and pain. With the patient prone, the lower back
was prepped with Betadine. 1% Lidocaine was used for local
anesthesia. Lumbar puncture was performed at the L4-5 level using a
20 gauge needle with return of clear CSF. 9.0 ml of CSF were
obtained for laboratory studies. The patient tolerated the procedure
well and there were no apparent complications.
IMPRESSION: Fluoroscopic guided lumbar puncture with 9 cc of clear CSF obtained
for appropriate laboratory evaluation.

## 2020-07-03 MED ORDER — LORAZEPAM 2 MG/ML IJ SOLN
1.0000 mg | Freq: Once | INTRAMUSCULAR | Status: AC
  Administered 2020-07-03: 1 mg via INTRAVENOUS
  Filled 2020-07-03: qty 1

## 2020-07-03 MED ORDER — QUETIAPINE FUMARATE 50 MG PO TABS: 50.0000 mg | ORAL_TABLET | Freq: Every evening | ORAL | Status: DC | PRN

## 2020-07-03 MED ORDER — LIDOCAINE HCL (PF) 1 % IJ SOLN
5.0000 mL | Freq: Once | INTRAMUSCULAR | Status: AC
  Administered 2020-07-03: 2 mL via INTRADERMAL

## 2020-07-03 MED ORDER — LORAZEPAM 2 MG/ML IJ SOLN
2.0000 mg | INTRAMUSCULAR | Status: DC | PRN
  Administered 2020-07-03 – 2020-07-05 (×9): 2 mg via INTRAVENOUS
  Filled 2020-07-03 (×10): qty 1

## 2020-07-03 MED ORDER — QUETIAPINE FUMARATE 25 MG PO TABS: 25.0000 mg | ORAL_TABLET | Freq: Every evening | ORAL | Status: DC | PRN

## 2020-07-03 MED ORDER — DEXTROSE-NACL 5-0.45 % IV SOLN: INTRAVENOUS | Status: DC

## 2020-07-03 NOTE — Plan of Care (Signed)
  Problem: Activity: Goal: Risk for activity intolerance will decrease Outcome: Progressing   Problem: Coping: Goal: Level of anxiety will decrease Outcome: Progressing   Problem: Elimination: Goal: Will not experience complications related to bowel motility Outcome: Progressing   

## 2020-07-03 NOTE — Progress Notes (Signed)
Bladder scan showed 761 ml.  I&O cath performed and returned 875 ml of light amber urine.

## 2020-07-03 NOTE — Progress Notes (Signed)
Physical Therapy Treatment Patient Details Name: Patrick Macias MRN: [id number] DOB: 07/25/1940 Today's Date: 07/03/2020    History of Present Illness Pt adm with AMS, fever of unknown origin and rash/hives. Brain MRI negative for acute abnormality. ? gout.  PMH - dementia, HTN, ?TIA.    PT Comments    Treatment limited due to level of arousal in setting of pt receiving sedating medications for agitation. Attempted to place bed in chair position to promote upright and alertness, however, pt keeping eyes closed and unable to follow commands. Intermittently verbalizing unintelligible speech and grimacing with certain movements. Transfer out of bed not safe at this time due to above. Will need to continue to reassess. Pt independent PTA, so hopeful with improved mentation, will be able to participate in therapy session.    Follow Up Recommendations  Home health PT;Supervision/Assistance - 24 hour     Equipment Recommendations  Other (comment) (TBA)    Recommendations for Other Services       Precautions / Restrictions Precautions Precautions: Fall;Other (comment) Precaution Comments: Restraints Restrictions Weight Bearing Restrictions: No    Mobility  Bed Mobility Overal bed mobility: Needs Assistance             General bed mobility comments: Placed bed in chair position, modA to pull trunk forward without support  Transfers                 General transfer comment: Deferred due to level of arousal  Ambulation/Gait                 Stairs             Wheelchair Mobility    Modified Rankin (Stroke Patients Only)       Balance                                            Cognition Arousal/Alertness: Lethargic Behavior During Therapy: Flat affect Overall Cognitive Status: Impaired/Different from baseline Area of Impairment: Following commands                       Following Commands:  (not following commands)        General Comments: Pt lethargic, would not open eyes during assessment, so unable to assess executive cognitive funcitons. Did not follow commands. Grimacing during certain movements and verbalizing unintelligible speech.      Exercises General Exercises - Lower Extremity Ankle Circles/Pumps: PROM;Both;5 reps;Supine Short Arc Quad: PROM;Both;5 reps;Supine    General Comments        Pertinent Vitals/Pain Pain Assessment: Faces Faces Pain Scale: Hurts little more Pain Location: grimacing with BUE ROM Pain Descriptors / Indicators: Grimacing;Tender Pain Intervention(s): Monitored during session    Home Living Family/patient expects to be discharged to:: Private residence Living Arrangements: Spouse/significant other Available Help at Discharge: Family;Available PRN/intermittently Type of Home: House Home Access: Stairs to enter Entrance Stairs-Rails: Right;Left Home Layout: One level Home Equipment: Cane - single point;Shower seat - built in Additional Comments: Pt reports built in seat in tub/shower, unable to recall walk in shower setup (reports "portable" but unable to describe this equipment thoroughly)    Prior Function Level of Independence: Independent          PT Goals (current goals can now be found in the care plan section) Acute Rehab PT Goals Patient Stated Goal: get  up PT Goal Formulation: With patient/family Time For Goal Achievement: 07/15/20 Potential to Achieve Goals: Good Progress towards PT goals: Not progressing toward goals - comment (sedated)    Frequency    Min 3X/week      PT Plan Current plan remains appropriate    Co-evaluation PT/OT/SLP Co-Evaluation/Treatment: Yes Reason for Co-Treatment: Complexity of the patient's impairments (multi-system involvement);For patient/therapist safety;To address functional/ADL transfers PT goals addressed during session: Mobility/safety with mobility;Strengthening/ROM        AM-PAC PT "6  Clicks" Mobility   Outcome Measure  Help needed turning from your back to your side while in a flat bed without using bedrails?: Total Help needed moving from lying on your back to sitting on the side of a flat bed without using bedrails?: Total Help needed moving to and from a bed to a chair (including a wheelchair)?: Total Help needed standing up from a chair using your arms (e.g., wheelchair or bedside chair)?: Total Help needed to walk in hospital room?: Total Help needed climbing 3-5 steps with a railing? : Total 6 Click Score: 6    End of Session Equipment Utilized During Treatment: Gait belt Activity Tolerance: Patient tolerated treatment well Patient left: with call bell/phone within reach;in bed;with nursing/sitter in room;with restraints reapplied Nurse Communication: Mobility status PT Visit Diagnosis: Unsteadiness on feet (R26.81);Other abnormalities of gait and mobility (R26.89);Muscle weakness (generalized) (M62.81)     Time: 0802-2336 PT Time Calculation (min) (ACUTE ONLY): 23 min  Charges:  $Therapeutic Activity: 8-22 mins                     Patrick Macias, PT, DPT Acute Rehabilitation Services Pager [phone] Office [phone]    Patrick Macias 07/03/2020, 2:23 PM

## 2020-07-03 NOTE — Progress Notes (Signed)
Patient very agitated, RN frequently trying to figure out patients needs(toileting, thirst/hunger, pain). RN then saw that patient kept putting his hands near his penis, pee was dribbling out, but not a full stream. Bladder scan done and showed >375mL, In & out done out. Patient now resting comfortably.

## 2020-07-03 NOTE — Progress Notes (Signed)
CSF profile reviewed with no elevated protein, glucose, no leukocytosis, prelim cultures are negative. Low concern for meningitis or encephalitis based on profile.  Discussed with hospitalist team and discontinued Acyclovir, Cefepime, Vancomycin.  Erick Blinks [facility] Pager Number [phone]

## 2020-07-03 NOTE — Evaluation (Signed)
Occupational Therapy Evaluation Patient Details Name: Patrick Macias MRN: [id number] DOB: Sep 23, 1940 Today's Date: 07/03/2020    History of Present Illness Pt adm with AMS, fever of unknown origin and rash/hives. Brain MRI negative for acute abnormality. ? gout.  PMH - dementia, HTN, ?TIA.   Clinical Impression   Eval limited due to level of arousal in setting of pt receiving sedating medications for agitation. Attempted to place bed in chair position to promote upright and alertness, however, pt keeping eyes closed and unable to follow commands. Intermittently verbalizing unintelligible speech and grimacing with certain movements. Pt unable to appropriately follow commands for simple face washing and transfer out of bed not safe at this time due to above. Will need to continue to assess/reassess. Pt independent with ADLs/selfcare, mobility and was driving PTA. Anticipate with improved mentation/cognition, pt physical function will improve. Pt with functional deficits listed below and would benefit from acute OT services to maximize level of function and safety   Follow Up Recommendations  Home health OT;Supervision/Assistance - 24 hour    Equipment Recommendations   (TBD)    Recommendations for Other Services  (TBD)     Precautions / Restrictions Precautions Precautions: Fall;Other (comment) Precaution Comments: Restraints Restrictions Weight Bearing Restrictions: No      Mobility Bed Mobility Overal bed mobility: Needs Assistance             General bed mobility comments: Placed bed in chair position, mod A to pull trunk forward without support    Transfers                 General transfer comment: Deferred due to level of arousal    Balance                                           ADL either performed or assessed with clinical judgement   ADL Overall ADL's : Needs assistance/impaired Eating/Feeding: Total assistance   Grooming:  Wash/dry face;Maximal assistance Grooming Details (indicate cue type and reason): washed chin with washcloth with multimodal cues to initiate. unable to follow commands to wash entire face Upper Body Bathing: Total assistance   Lower Body Bathing: Total assistance   Upper Body Dressing : Total assistance   Lower Body Dressing: Total assistance   Toilet Transfer: Total assistance   Toileting- Clothing Manipulation and Hygiene: Total assistance         General ADL Comments: pt with eyes closed and with minimal reposnses to commands, some verbaliztions difficult to understand     Vision Baseline Vision/History: Wears glasses Wears Glasses: Reading only Patient Visual Report: No change from baseline       Perception     Praxis      Pertinent Vitals/Pain Pain Assessment: Faces Faces Pain Scale: Hurts little more Pain Location: grimacing with BUE ROM Pain Descriptors / Indicators: Grimacing;Tender Pain Intervention(s): Monitored during session     Hand Dominance Left   Extremity/Trunk Assessment Upper Extremity Assessment Upper Extremity Assessment: Generalized weakness   Lower Extremity Assessment Lower Extremity Assessment: Defer to PT evaluation       Communication Communication Communication: HOH   Cognition Arousal/Alertness: Lethargic Behavior During Therapy: Flat affect Overall Cognitive Status: Impaired/Different from baseline Area of Impairment: Following commands                       Following Commands:  (  not following commands)       General Comments: Pt lethargic, would not open eyes during assessment, so unable to assess executive cognitive funcitons. Did not follow commands. Grimacing during certain movements and verbalizing unintelligible speech.   General Comments       Exercises Exercises: General Lower Extremity General Exercises - Lower Extremity Ankle Circles/Pumps: PROM;Both;5 reps;Supine Short Arc Quad: PROM;Both;5  reps;Supine   Shoulder Instructions      Home Living Family/patient expects to be discharged to:: Private residence Living Arrangements: Spouse/significant other Available Help at Discharge: Family;Available PRN/intermittently Type of Home: House Home Access: Stairs to enter [employer] of Steps: 2-3 Entrance Stairs-Rails: Right;Left Home Layout: One level     Bathroom Shower/Tub: Walk-in shower;Tub/shower unit   Bathroom Toilet: Handicapped height     Home Equipment: Cane - single point;Shower seat - built in   Additional Comments: Pt reports built in seat in tub/shower, unable to recall walk in shower setup (reports "portable" but unable to describe this equipment thoroughly)      Prior Functioning/Environment Level of Independence: Independent                 OT Problem List: Decreased strength;Impaired balance (sitting and/or standing);Decreased cognition;Pain;Decreased range of motion;Decreased activity tolerance;Decreased coordination;Decreased knowledge of use of DME or AE      OT Treatment/Interventions: Self-care/ADL training;Therapeutic exercise;DME and/or AE instruction;Balance training;Patient/family education    OT Goals(Current goals can be found in the care plan section) Acute Rehab OT Goals Patient Stated Goal: get up OT Goal Formulation: With patient/family Time For Goal Achievement: 07/17/20 Potential to Achieve Goals: Good  OT Frequency: Min 2X/week   Barriers to D/C:            Co-evaluation PT/OT/SLP Co-Evaluation/Treatment: Yes Reason for Co-Treatment: Complexity of the patient's impairments (multi-system involvement);For patient/therapist safety;To address functional/ADL transfers;Necessary to address cognition/behavior during functional activity PT goals addressed during session: Mobility/safety with mobility;Strengthening/ROM OT goals addressed during session: ADL's and self-care      AM-PAC OT "6 Clicks" Daily Activity      Outcome Measure Help from another person eating meals?: Total Help from another person taking care of personal grooming?: A Lot Help from another person toileting, which includes using toliet, bedpan, or urinal?: Total Help from another person bathing (including washing, rinsing, drying)?: Total Help from another person to put on and taking off regular upper body clothing?: Total Help from another person to put on and taking off regular lower body clothing?: Total 6 Click Score: 7   End of Session    Activity Tolerance: Patient limited by lethargy Patient left: in bed;with call bell/phone within reach;with bed alarm set;with family/visitor present;with nursing/sitter in room  OT Visit Diagnosis: Other abnormalities of gait and mobility (R26.89);Muscle weakness (generalized) (M62.81);Other symptoms and signs involving cognitive function;Pain Pain - Right/Left: Right Pain - part of body: Hand;Arm                Time: 1306-1330 OT Time Calculation (min): 24 min Charges:  OT General Charges $OT Visit: 1 Visit OT Evaluation $OT Eval Moderate Complexity: 1 Mod    Galen Manila 07/03/2020, 3:32 PM

## 2020-07-03 NOTE — Progress Notes (Signed)
[facility] PROGRESS NOTE    Progress Note  JADIN CREQUE  XFG:[id number] DOB: 1941/05/24 DOA: 06/30/2020 PCP: Rodrigo Ran, MD     Brief Narrative:   FRANCK VINAL is an 80 y.o. male past medical history of vascular dementia, essential hypertension recently discharged from the hospital for TIA was discharged on aspirin and Plavix, was brought into the ED for fevers and altered mental status for 1 week in the ED he was found afebrile with a CT scan showed no acute findings, chest x-ray was unremarkable electrolytes glucose and BUN were abnormal but not significantly.  Patient was transferred to [facility] for an MRI  Assessment/Plan:   Acute metabolic encephalopathy superimposed on vascular dementia: He has been having temperature of 100.5 repeatedly, he was started empirically on IV vancomycin cefepime and acyclovir. Culture data has remained negative till date. MRI of the brain showed no acute findings. EEG showed no signs of seizures moderate diffuse encephalopathy nonspecific etiology. Continue replete B12. Continue aspirin.  The family requested to use Ativan as he has been having bad night, Ativan 2 mg worked the first night, I did explain to them that this could cause delirium but they are willing to take the risk. We will go ahead and start him on Seroquel at bedtime Haldol and melatonin if needed. He is currently in restraints due to his agitation. There is a concern for encephalitis neurology agreed with LP, they also ordered rheumatoid factor, ANA, ENA and a, double-stranded DNA and autoimmune encephalitis panel of CSF and serum.Further management per neurology.  With fever joint pain, altered mental status and a positive rheumatoid factor with no clear source of infection we will try to get rheumatology on board  Fever of unknown origin: Chest x-ray shows no sign of pneumonia, UA shows no signs of infection. Blood culture urine culture have been negative till  date. Multiple imaging were done that did not show an effusion or fractures. His highest temperature was 101.5 he was started empirically on acyclovir, culture data has remained negative till date. He does have a past medical history of gout almost 20 years ago but he has multiarthropathy bilateral wrist elbows left shoulder right knee and right hip.   Also in the differential will include viral meningitis versus viral encephalitis. There is no effusion in joints to perform arthrocentesis.  Polyarticular swelling and erythema: Concerned about gout unlikely cellulitis or septic arthritis, imaging shows no effusion. We will get right wrist and knee x-ray to look for effusion. He was started on naproxen and Protonix for gastric prophylaxis.  Essential hypertension: Holding antihypertensive medication allow permissive hypertension for concerns TIA  DVT prophylaxis: lovenox Family Communication:none Status is: Observation  The patient remains OBS appropriate and will d/c before 2 midnights.  Dispo: The patient is from: Home              Anticipated d/c is to: Home              Anticipated d/c date is: 3 days              Patient currently is not medically stable to d/c.        Code Status:     Code Status Orders  (From admission, onward)         Start     Ordered   06/30/20 2007  Full code  Continuous        06/30/20 2011        Code Status  History    Date Active Date Inactive Code Status Order ID Comments User Context   06/01/2020 2213 06/02/2020 2216 Partial Code [id number]  Marcelyn Bruins, MD Inpatient   06/01/2020 2130 06/01/2020 2212 Full Code [id number]  Marcelyn Bruins, MD Inpatient   Advance Care Planning Activity        IV Access:    Peripheral IV   Procedures and diagnostic studies:   DG Wrist 2 Views Right  Result Date: 07/02/2020 CLINICAL DATA:  Wrist pain, recent injury EXAM: RIGHT WRIST - 2 VIEW COMPARISON:  None. FINDINGS: There is no  evidence of fracture or dislocation. There is no evidence of arthropathy or other focal bone abnormality. Soft tissues are unremarkable. IMPRESSION: No acute osseous finding Electronically Signed   By: Jerilynn Mages.  Shick M.D.   On: 07/02/2020 11:31   DG Knee 1-2 Views Right  Result Date: 07/02/2020 CLINICAL DATA:  Knee pain EXAM: RIGHT KNEE - 1-2 VIEW COMPARISON:  None. FINDINGS: Normal alignment without acute osseous finding or fracture. Preserved joint spaces. No significant arthropathy. Negative for effusion. Peripheral atherosclerosis noted. No focal soft tissue abnormality. IMPRESSION: No acute osseous finding. Peripheral atherosclerosis. Electronically Signed   By: Jerilynn Mages.  Shick M.D.   On: 07/02/2020 11:29   MR BRAIN W WO CONTRAST  Result Date: 07/02/2020 CLINICAL DATA:  Delirium. EXAM: MRI HEAD WITHOUT AND WITH CONTRAST TECHNIQUE: Multiplanar, multiecho pulse sequences of the brain and surrounding structures were obtained without and with intravenous contrast. CONTRAST:  7.73mL GADAVIST GADOBUTROL 1 MMOL/ML IV SOLN COMPARISON:  Head CT June 30, 2020. FINDINGS: The study is significantly degraded by motion. Brain: Increased signal in the diffusion-weighted sequence in the left parietal lobe likely represents artifact related to motion as it is not reproduced on coronal images. No acute infarction, hemorrhage, hydrocephalus, extra-axial collection or mass lesion. Scattered and confluent foci of T2 hyperintensity are seen within the white matter of the cerebral hemispheres, nonspecific, most likely related to chronic small vessel ischemia. Vascular: Normal flow voids. Skull and upper cervical spine: Normal marrow signal. Sinuses/Orbits: Negative. IMPRESSION: 1. Motion degraded study. 2. No acute intracranial abnormality. 3. Advanced chronic small vessel ischemia. Electronically Signed   By: Pedro Earls M.D.   On: 07/02/2020 09:56   EEG adult  Result Date: 07/01/2020 Lora Havens, MD      07/01/2020  8:55 PM Patient Name: ARDAN ASTA MRN: [id number] Epilepsy Attending: Lora Havens Referring Physician/Provider: Dr Rosalin Hawking Date: 07/01/2020 Duration: 22.58 mins Patient history: y.o. male with PMH of HTN, HLD, recent cognitive decline and recent admission for AMS admitted again for AMS, rash, fever, and whole body ache. EEG to evaluate for seizure Level of alertness: asleep AEDs during EEG study: Ativan Technical aspects: This EEG study was done with scalp electrodes positioned according to the 10-20 International system of electrode placement. Electrical activity was acquired at a sampling rate of 500Hz  and reviewed with a high frequency filter of 70Hz  and a low frequency filter of 1Hz . EEG data were recorded continuously and digitally stored. Description: No posterior dominant rhythm was seen. Sleep was characterized by vertex waves, sleep spindles (12 to 14 Hz), maximal frontocentral region.  EEG showed continuous generalized 3 to 6 Hz theta-delta slowing.  Hyperventilation and photic stimulation were not performed.   ABNORMALITY -Continuous slow, generalized IMPRESSION: This study is suggestive of moderate diffuse encephalopathy, nonspecific etiology. No seizures or epileptiform discharges were seen throughout the recording. [place]  Consultants:    None.  Anti-Infectives:   Vanc, cefepiem and falgyl  Subjective:    ACESYN MAIA sleepy this morning.  Objective:    Vitals:   07/03/20 0533 07/03/20 0534 07/03/20 0538 07/03/20 0540  BP:    123/66  Pulse: 70 71 72 72  Resp: (!) 25 (!) 24 17 (!) 21  Temp:   98.1 F (36.7 C)   TempSrc:   Oral   SpO2: 96% 96% 97% 96%  Weight:   83.3 kg   Height:       SpO2: 96 % O2 Flow Rate (L/min): 2 L/min   Intake/Output Summary (Last 24 hours) at 07/03/2020 0826 Last data filed at 07/03/2020 0616 Gross per 24 hour  Intake 2103.81 ml  Output 1100 ml  Net 1003.81 ml   Filed Weights   06/30/20 0830  07/01/20 0350 07/03/20 0538  Weight: 77.1 kg 82.4 kg 83.3 kg    Exam: General exam: In no acute distress. Respiratory system: Good air movement and clear to auscultation. Cardiovascular system: S1 & S2 heard, RRR. No JVD. Gastrointestinal system: Abdomen is nondistended, soft and nontender.  Extremities: No pedal edema. Skin: No rashes, lesions or ulcers  Data Reviewed:    Labs: Basic Metabolic Panel: Recent Labs  Lab 06/30/20 0831 06/30/20 Oct 08, 2120 07/01/20 0323 07/02/20 0417 07/03/20 0511  NA 134*  --  135 136 139  K 3.6  --  3.7 3.7 3.3*  CL 102  --  103 101 106  CO2 22  --  22 23 21*  GLUCOSE 203*  --  116* 123* 122*  BUN 27*  --  17 22 22   CREATININE 1.13  --  1.02 0.96 0.93  CALCIUM 8.6*  --  8.4* 8.4* 8.2*  MG  --  1.6*  --   --   --   PHOS  --  3.0  --   --   --    GFR Estimated Creatinine Clearance: 70.8 mL/min (by C-G formula based on SCr of 0.93 mg/dL). Liver Function Tests: Recent Labs  Lab 06/30/20 0831 07/01/20 0323  AST 24 24  ALT 16 15  ALKPHOS 29* 26*  BILITOT 1.5* 1.6*  PROT 6.7 5.8*  ALBUMIN 3.6 2.9*   No results for input(s): LIPASE, AMYLASE in the last 168 hours. Recent Labs  Lab 06/30/20 10/08/20  AMMONIA 28   Coagulation profile Recent Labs  Lab 06/30/20 0831  INR 1.2   COVID-19 Labs  Recent Labs    06/30/20 0831  CRP 12.6*    Lab Results  Component Value Date   SARSCOV2NAA NEGATIVE 06/30/2020   Encinal NEGATIVE 06/01/2020    CBC: Recent Labs  Lab 06/30/20 0831 07/01/20 0323 07/02/20 0417 07/03/20 0511  WBC 7.1 9.1 7.9 6.3  NEUTROABS 5.6  --  5.7 4.6  HGB 10.9* 10.8* 10.5* 9.6*  HCT 31.7* 30.9* 31.3* 29.1*  MCV 99.7 98.7 100.3* 100.7*  PLT 181 172 176 209   Cardiac Enzymes: Recent Labs  Lab 06/30/20 2122  CKTOTAL 398*   BNP (last 3 results) No results for input(s): PROBNP in the last 8760 hours. CBG: Recent Labs  Lab 06/30/20 1544  GLUCAP 114*   D-Dimer: No results for input(s): DDIMER in the  last 72 hours. Hgb A1c: No results for input(s): HGBA1C in the last 72 hours. Lipid Profile: No results for input(s): CHOL, HDL, LDLCALC, TRIG, CHOLHDL, LDLDIRECT in the last 72 hours. Thyroid function studies: Recent Labs    06/30/20 10/08/20  TSH 0.873   Anemia work up: Recent Labs    06/30/20 2122  VITAMINB12 221  FOLATE 10.1   Sepsis Labs: Recent Labs  Lab 06/30/20 0831 06/30/20 1035 07/01/20 0323 07/01/20 1437 07/02/20 0417 07/03/20 0511  PROCALCITON  --   --   --  0.82 0.87 0.45  WBC 7.1  --  9.1  --  7.9 6.3  LATICACIDVEN 2.1* 1.2  --   --   --   --    Microbiology Recent Results (from the past 240 hour(s))  Blood Culture (routine x 2)     Status: None (Preliminary result)   Collection Time: 06/30/20  8:31 AM   Specimen: BLOOD LEFT ARM  Result Value Ref Range Status   Specimen Description   Final    BLOOD LEFT ARM Performed at [facility], [address] [place], [place] [address]    Special Requests   Final    BOTTLES DRAWN AEROBIC AND ANAEROBIC Blood Culture adequate volume Performed at [facility], [address], [place], [place] [address]    Culture   Final    NO GROWTH 3 DAYS Performed at [facility], [place] [address] [place], [place] [address]    Report Status PENDING  Incomplete  Urine culture     Status: None   Collection Time: 06/30/20  8:32 AM   Specimen: In/Out Cath Urine  Result Value Ref Range Status   Specimen Description   Final    IN/OUT CATH URINE Performed at [facility], [address] [place], [place] [address]    Special Requests   Final    NONE Performed at [facility], [address] [place], [place] [address]    Culture   Final    NO GROWTH Performed at [facility], [place] [address] [place], [place] [address]    Report Status 07/01/2020 FINAL  Final  Blood Culture (routine x 2)     Status: None (Preliminary result)   Collection Time: 06/30/20  9:00 AM    Specimen: BLOOD LEFT HAND  Result Value Ref Range Status   Specimen Description   Final    BLOOD LEFT HAND Performed at [facility], [address] [place], [place] [address]    Special Requests   Final    BOTTLES DRAWN AEROBIC AND ANAEROBIC Blood Culture adequate volume Performed at [facility], [address] [place], [place] [address]    Culture   Final    NO GROWTH 3 DAYS Performed at [facility], [place] [address], [place], [place] [address]    Report Status PENDING  Incomplete  Resp Panel by RT-PCR (Flu A&B, Covid) Nasopharyngeal Swab     Status: None   Collection Time: 06/30/20 10:34 AM   Specimen: Nasopharyngeal Swab; Nasopharyngeal(NP) swabs in vial transport medium  Result Value Ref Range Status   SARS Coronavirus 2 by RT PCR NEGATIVE NEGATIVE Final    Comment: (NOTE) SARS-CoV-2 target nucleic acids are NOT DETECTED.  The SARS-CoV-2 RNA is generally detectable in upper respiratory specimens during the acute phase of infection. The lowest concentration of SARS-CoV-2 viral copies this assay can detect is 138 copies/mL. A negative result does not preclude SARS-Cov-2 infection and should not be used as the sole basis for treatment or other patient management decisions. A negative result may occur with  improper specimen collection/handling, submission of specimen  other than nasopharyngeal swab, presence of viral mutation(s) within the areas targeted by this assay, and inadequate number of viral copies(<138 copies/mL). A negative result must be combined with clinical observations, patient history, and epidemiological information. The expected result is Negative.  Fact Sheet for Patients:  EntrepreneurPulse.com.au  Fact Sheet for Healthcare Providers:  IncredibleEmployment.be  This test is no t yet approved or cleared by the [place] FDA and  has been authorized for detection and/or diagnosis of  SARS-CoV-2 by FDA under an Emergency Use Authorization (EUA). This EUA will remain  in effect (meaning this test can be used) for the duration of the COVID-19 declaration under Section 564(b)(1) of the Act, 21 U.S.C.section 360bbb-3(b)(1), unless the authorization is terminated  or revoked sooner.       Influenza A by PCR NEGATIVE NEGATIVE Final   Influenza B by PCR NEGATIVE NEGATIVE Final    Comment: (NOTE) The Xpert Xpress SARS-CoV-2/FLU/RSV plus assay is intended as an aid in the diagnosis of influenza from Nasopharyngeal swab specimens and should not be used as a sole basis for treatment. Nasal washings and aspirates are unacceptable for Xpert Xpress SARS-CoV-2/FLU/RSV testing.  Fact Sheet for Patients: EntrepreneurPulse.com.au  Fact Sheet for Healthcare Providers: IncredibleEmployment.be  This test is not yet approved or cleared by the [place] FDA and has been authorized for detection and/or diagnosis of SARS-CoV-2 by FDA under an Emergency Use Authorization (EUA). This EUA will remain in effect (meaning this test can be used) for the duration of the COVID-19 declaration under Section 564(b)(1) of the Act, 21 U.S.C. section 360bbb-3(b)(1), unless the authorization is terminated or revoked.  Performed at [facility], [address] [place], [place] [address]      Medications:   . aspirin EC  81 mg Oral Daily  . cyanocobalamin  1,000 mcg Intramuscular Daily   Followed by  . [START ON 07/12/2020] cyanocobalamin  1,000 mcg Intramuscular Q Thu   Followed by  . [START ON 08/30/2020] cyanocobalamin  1,000 mcg Intramuscular Q28 days  . naproxen  500 mg Oral BID WC  . pantoprazole (PROTONIX) IV  40 mg Intravenous Q12H  . rosuvastatin  20 mg Oral QHS  . [START ON 07/10/2020] thiamine injection  100 mg Intravenous Daily   Continuous Infusions: . sodium chloride 10 mL/hr at 07/03/20 0534  . acyclovir 800 mg (07/03/20  0733)  . ceFEPime (MAXIPIME) IV Stopped (07/02/20 2254)  . thiamine injection 500 mg (07/03/20 0616)   Followed by  . [START ON 07/04/2020] thiamine injection    . vancomycin 150 mL/hr at 07/03/20 0536      LOS: 2 days   Charlynne Cousins  [facility]  07/03/2020, 8:26 AM

## 2020-07-04 DIAGNOSIS — R338 Other retention of urine: Secondary | ICD-10-CM | POA: Diagnosis not present

## 2020-07-04 DIAGNOSIS — R509 Fever, unspecified: Secondary | ICD-10-CM | POA: Diagnosis not present

## 2020-07-04 DIAGNOSIS — I10 Essential (primary) hypertension: Secondary | ICD-10-CM | POA: Diagnosis not present

## 2020-07-04 LAB — CBC WITH DIFFERENTIAL/PLATELET
Abs Immature Granulocytes: 0.02 10*3/uL (ref 0.00–0.07)
Basophils Absolute: 0 10*3/uL (ref 0.0–0.1)
Basophils Relative: 0 %
Eosinophils Absolute: 0.4 10*3/uL (ref 0.0–0.5)
Eosinophils Relative: 9 %
HCT: 29.3 % — ABNORMAL LOW (ref 39.0–52.0)
Hemoglobin: 9.9 g/dL — ABNORMAL LOW (ref 13.0–17.0)
Immature Granulocytes: 0 %
Lymphocytes Relative: 25 %
Lymphs Abs: 1.1 10*3/uL (ref 0.7–4.0)
MCH: 33.2 pg (ref 26.0–34.0)
MCHC: 33.8 g/dL (ref 30.0–36.0)
MCV: 98.3 fL (ref 80.0–100.0)
Monocytes Absolute: 0.4 10*3/uL (ref 0.1–1.0)
Monocytes Relative: 9 %
Neutro Abs: 2.5 10*3/uL (ref 1.7–7.7)
Neutrophils Relative %: 57 %
Platelets: 257 10*3/uL (ref 150–400)
RBC: 2.98 MIL/uL — ABNORMAL LOW (ref 4.22–5.81)
RDW: 12.4 % (ref 11.5–15.5)
WBC: 4.5 10*3/uL (ref 4.0–10.5)
nRBC: 0 % (ref 0.0–0.2)

## 2020-07-04 LAB — BASIC METABOLIC PANEL
Anion gap: 10 (ref 5–15)
BUN: 18 mg/dL (ref 8–23)
CO2: 23 mmol/L (ref 22–32)
Calcium: 8.1 mg/dL — ABNORMAL LOW (ref 8.9–10.3)
Chloride: 105 mmol/L (ref 98–111)
Creatinine, Ser: 0.78 mg/dL (ref 0.61–1.24)
GFR, Estimated: >60 mL/min (ref >60)
Glucose, Bld: 118 mg/dL — ABNORMAL HIGH (ref 70–99)
Potassium: 3.3 mmol/L — ABNORMAL LOW (ref 3.5–5.1)
Sodium: 138 mmol/L (ref 135–145)

## 2020-07-04 LAB — VARICELLA-ZOSTER BY PCR: Varicella-Zoster, PCR: NEGATIVE

## 2020-07-04 LAB — URINALYSIS, ROUTINE W REFLEX MICROSCOPIC
Bilirubin Urine: NEGATIVE
Glucose, UA: NEGATIVE mg/dL
Ketones, ur: NEGATIVE mg/dL
Leukocytes,Ua: NEGATIVE
Nitrite: NEGATIVE
Protein, ur: NEGATIVE mg/dL
Specific Gravity, Urine: 1.016 (ref 1.005–1.030)
pH: 5 (ref 5.0–8.0)

## 2020-07-04 LAB — EPSTEIN BARR VRS(EBV DNA BY PCR)
EBV DNA QN by PCR: NEGATIVE copies/mL
log10 EBV DNA Qn PCR: UNDETERMINED log10 copy/mL

## 2020-07-04 LAB — LD, BODY FLUID (OTHER): LD, Body Fluid: 27 IU/L

## 2020-07-04 LAB — MAGNESIUM: Magnesium: 1.9 mg/dL (ref 1.7–2.4)

## 2020-07-04 LAB — CYCLIC CITRUL PEPTIDE ANTIBODY, IGG/IGA: CCP Antibodies IgG/IgA: 2 units (ref 0–19)

## 2020-07-04 MED ORDER — LORAZEPAM 2 MG/ML IJ SOLN
2.0000 mg | Freq: Once | INTRAMUSCULAR | Status: AC
  Administered 2020-07-04: 2 mg via INTRAVENOUS

## 2020-07-04 MED ORDER — QUETIAPINE FUMARATE 25 MG PO TABS
25.0000 mg | ORAL_TABLET | Freq: Every day | ORAL | Status: DC
  Administered 2020-07-04: 25 mg via ORAL
  Filled 2020-07-04: qty 1

## 2020-07-04 MED ORDER — CHLORHEXIDINE GLUCONATE CLOTH 2 % EX PADS
6.0000 | MEDICATED_PAD | Freq: Every day | CUTANEOUS | Status: DC
  Administered 2020-07-04 – 2020-07-12 (×8): 6 via TOPICAL

## 2020-07-04 MED ORDER — TAMSULOSIN HCL 0.4 MG PO CAPS
0.4000 mg | ORAL_CAPSULE | Freq: Every day | ORAL | Status: DC
  Administered 2020-07-04 – 2020-07-12 (×8): 0.4 mg via ORAL
  Filled 2020-07-04 (×9): qty 1

## 2020-07-04 MED ORDER — LIDOCAINE HCL URETHRAL/MUCOSAL 2 % EX GEL
1.0000 "application " | Freq: Once | CUTANEOUS | Status: DC
  Filled 2020-07-04: qty 11

## 2020-07-04 MED ORDER — POTASSIUM CHLORIDE CRYS ER 20 MEQ PO TBCR
40.0000 meq | EXTENDED_RELEASE_TABLET | Freq: Once | ORAL | Status: DC
  Filled 2020-07-04: qty 2

## 2020-07-04 NOTE — Progress Notes (Signed)
PROGRESS NOTE    Patrick Macias  [id number] DOB: 28-Sep-1940 DOA: 06/30/2020 PCP: Crist Infante, MD   Brief Narrative:  HPI on 06/30/2020 by Dr. Atha Starks Patrick Macias is a 80 y.o. male with hx of vascular dementia, BPH, dyslipidemia, hypertension, who presented to the facility on 06/30/2020 secondary to altered mental status, generalized weakness, fever.  At the time of exam, patient is cephalopathic, thus a complete and thorough HPI review systems unable to be fully obtained.  Information obtained from chart and daughter and wife bedside.  Apparently on Monday, patient developed hives on different parts of his body that on his body but was primarily his back, not in 1 dermatome.  He tried to take Zyrtec with no improvement of his rash.  He had been sleeping well due to the rash.  Then apparently yesterday he hit his hand on something but family does not know what.  Then the morning presentation, patient had a fever, and wife found him in bed significantly altered, encephalopathic, with inability to get out of bed.  He had abnormal speech, shaking, and was not a self and thus EMS was contacted to bring him.  Of note, patient was recently hospitalized in the beginning of December 2021 for encephalopathy.  MRI did not show a CVA but showed findings concerning with chronic microvascular ischemia.  Neurology was consulted and evaluated patient.  Per family there was discussion that he has vascular dementia.  He was scheduled to follow-up with neurology outpatient.  Psychogenic drugs he is on are Lexapro, and galantamine.  Interim history Admitted with AMS and fever. Neurology consulted. So far no infectious etiology found. S/p LP, unremarkable. Neurology feels this could be his dementia.  Also noted to have urinary retention, Foley catheter to be placed today. Assessment & Plan   Acute metabolic encephalopathy with underlying vascular dementia -Patient currently alert and oriented  x2 -Patient presented with fever and was placed empirically on IV vancomycin, cefepime and acyclovir -Culture data has remained negative to date -MThisRI brain showed no acute finding -EEG showed no signs of seizure.  Moderate diffuse encephalopathy nonspecific etiology. -Neurology consulted and appreciated, status post lumbar puncture -Discussed with neurology today, Dr. Lorrin Goodell, recommended starting Seroquel at bedtime.  Have placed patient on 25 mg of Seroquel. -Neurology also recommended follow-up as an outpatient given some laboratory studies on the lumbar puncture will take several weeks to result.  Fever of unknown origin -Chest x-ray shows no pneumonia -UA unremarkable for infection -Blood cultures show no growth to date -Patient was empirically started on acyclovir, vancomycin, and cefepime, which have now been discontinued -Question if this is due to underlying rheumatological issue -Patient does have a history of gout approximate 20 years ago -Currently afebrile we will continue to monitor  Polyarticular swelling and erythema -At this time no concern for infection given the imaging shows no effusions -Started on naproxen and PPI for gastric prophylaxis -PT and OT recommending home health  Essential hypertension -Allowing for permissive hypertension given question of TIA although work-up for TIA does seem unremarkable at this time  Hypokalemia -Replace potassium continue to monitor BMP -obtain magnesium level  Urinary retention -Patient has had several in and out catheterizations he continues to have urinary retention -Possibly secondary to acute illness -Will place Foley catheter and start patient on Flomax and continue to monitor  DVT Prophylaxis  SCDs  Code Status: Full  Family Communication: Spouse via phone  Disposition Plan:  Status is: Inpatient  Remains inpatient  appropriate because:Inpatient level of care appropriate due to severity of  illness   Dispo: The patient is from: Home              Anticipated d/c is to: Home wit home health              Anticipated d/c date is: 2 days              Patient currently is not medically stable to d/c.  Consultants Neurology  Procedures  Lumbar puncture  Antibiotics   Anti-infectives (From admission, onward)   Start     Dose/Rate Route Frequency Ordered Stop   07/02/20 1400  acyclovir (ZOVIRAX) 800 mg in dextrose 5 % 150 mL IVPB  Status:  Discontinued        800 mg 166 mL/hr over 60 Minutes Intravenous Every 8 hours 07/02/20 0913 07/03/20 1841   07/01/20 1445  vancomycin (VANCOREADY) IVPB 750 mg/150 mL  Status:  Discontinued        750 mg 150 mL/hr over 60 Minutes Intravenous Every 12 hours 07/01/20 1354 07/03/20 1842   07/01/20 1445  acyclovir (ZOVIRAX) 800 mg in dextrose 5 % 150 mL IVPB  Status:  Discontinued        800 mg 166 mL/hr over 60 Minutes Intravenous Every 8 hours 07/01/20 1354 07/02/20 0825   07/01/20 1000  ceFEPIme (MAXIPIME) 2 g in sodium chloride 0.9 % 100 mL IVPB  Status:  Discontinued        2 g 200 mL/hr over 30 Minutes Intravenous Every 12 hours 06/30/20 2357 07/03/20 1842   06/30/20 2300  vancomycin (VANCOREADY) IVPB 750 mg/150 mL  Status:  Discontinued        750 mg 150 mL/hr over 60 Minutes Intravenous Every 12 hours 06/30/20 1147 06/30/20 2357   06/30/20 2200  ceFEPIme (MAXIPIME) 2 g in sodium chloride 0.9 % 100 mL IVPB  Status:  Discontinued        2 g 200 mL/hr over 30 Minutes Intravenous Every 12 hours 06/30/20 1147 06/30/20 2357   06/30/20 1400  metroNIDAZOLE (FLAGYL) tablet 500 mg  Status:  Discontinued        500 mg Oral Every 8 hours 06/30/20 1137 06/30/20 2357   06/30/20 1145  ceFEPIme (MAXIPIME) 2 g in sodium chloride 0.9 % 100 mL IVPB        2 g 200 mL/hr over 30 Minutes Intravenous  Once 06/30/20 1137 06/30/20 1230   06/30/20 1145  vancomycin (VANCOCIN) IVPB 1000 mg/200 mL premix        1,000 mg 200 mL/hr over 60 Minutes Intravenous   Once 06/30/20 1137 06/30/20 1337      Subjective:   Patrick Macias seen and examined today.  Patient with no complaints this morning.  Denies current chest pain or shortness of breath.  Patient knows he is in the hospital but cannot tell me the month or year.  He complains of some pain in his joints.  Objective:   Vitals:   07/03/20 1700 07/03/20 1900 07/04/20 0400 07/04/20 0700  BP: 124/66 140/81 (!) 145/70 (!) 169/96  Pulse: 65  60 (!) 54  Resp: 17  15 18   Temp:  98.1 F (36.7 C) 97.9 F (36.6 C) 97.8 F (36.6 C)  TempSrc:  Oral Axillary Oral  SpO2:   99% 98%  Weight:   83.4 kg   Height:        Intake/Output Summary (Last 24 hours) at 07/04/2020 1311 Last data  filed at 07/04/2020 1219 Gross per 24 hour  Intake 1150 ml  Output 2475 ml  Net -1325 ml   Filed Weights   07/01/20 0350 07/03/20 0538 07/04/20 0400  Weight: 82.4 kg 83.3 kg 83.4 kg    Exam  General: Well developed, chronically ill-appearing, NAD  HEENT: NCAT,  mucous membranes moist.   Cardiovascular: S1 S2 auscultated, RRR.  Respiratory: Clear to auscultation bilaterally with equal chest rise  Abdomen: Soft, nontender, nondistended, + bowel sounds  Extremities: warm dry without cyanosis clubbing or edema  Neuro: AAOx2 (self, place), nonfocal  Psych: appropriate mood and affect   Data Reviewed: I have personally reviewed following labs and imaging studies  CBC: Recent Labs  Lab 06/30/20 0831 07/01/20 0323 07/02/20 0417 07/03/20 0511 07/04/20 0238  WBC 7.1 9.1 7.9 6.3 4.5  NEUTROABS 5.6  --  5.7 4.6 2.5  HGB 10.9* 10.8* 10.5* 9.6* 9.9*  HCT 31.7* 30.9* 31.3* 29.1* 29.3*  MCV 99.7 98.7 100.3* 100.7* 98.3  PLT 181 172 176 209 [id number]   Basic Metabolic Panel: Recent Labs  Lab 06/30/20 0831 06/30/20 2122 07/01/20 0323 07/02/20 0417 07/03/20 0511 07/04/20 0238  NA 134*  --  135 136 139 138  K 3.6  --  3.7 3.7 3.3* 3.3*  CL 102  --  103 101 106 105  CO2 22  --  22 23 21* 23  GLUCOSE 203*   --  116* 123* 122* 118*  BUN 27*  --  17 22 22 18   CREATININE 1.13  --  1.02 0.96 0.93 0.78  CALCIUM 8.6*  --  8.4* 8.4* 8.2* 8.1*  MG  --  1.6*  --   --   --   --   PHOS  --  3.0  --   --   --   --    GFR: Estimated Creatinine Clearance: 82.4 mL/min (by C-G formula based on SCr of 0.78 mg/dL). Liver Function Tests: Recent Labs  Lab 06/30/20 0831 07/01/20 0323  AST 24 24  ALT 16 15  ALKPHOS 29* 26*  BILITOT 1.5* 1.6*  PROT 6.7 5.8*  ALBUMIN 3.6 2.9*   No results for input(s): LIPASE, AMYLASE in the last 168 hours. Recent Labs  Lab 06/30/20 2122  AMMONIA 28   Coagulation Profile: Recent Labs  Lab 06/30/20 0831  INR 1.2   Cardiac Enzymes: Recent Labs  Lab 06/30/20 2122  CKTOTAL 398*   BNP (last 3 results) No results for input(s): PROBNP in the last 8760 hours. HbA1C: No results for input(s): HGBA1C in the last 72 hours. CBG: Recent Labs  Lab 06/30/20 1544  GLUCAP 114*   Lipid Profile: No results for input(s): CHOL, HDL, LDLCALC, TRIG, CHOLHDL, LDLDIRECT in the last 72 hours. Thyroid Function Tests: No results for input(s): TSH, T4TOTAL, FREET4, T3FREE, THYROIDAB in the last 72 hours. Anemia Panel: No results for input(s): VITAMINB12, FOLATE, FERRITIN, TIBC, IRON, RETICCTPCT in the last 72 hours. Urine analysis:    Component Value Date/Time   COLORURINE YELLOW 07/04/2020 [place] 07/04/2020 1219   LABSPEC 1.016 07/04/2020 1219   PHURINE 5.0 07/04/2020 1219   GLUCOSEU NEGATIVE 07/04/2020 1219   HGBUR MODERATE (A) 07/04/2020 1219   BILIRUBINUR NEGATIVE 07/04/2020 1219   KETONESUR NEGATIVE 07/04/2020 1219   PROTEINUR NEGATIVE 07/04/2020 1219   NITRITE NEGATIVE 07/04/2020 1219   LEUKOCYTESUR NEGATIVE 07/04/2020 1219   Sepsis Labs: @LABRCNTIP (procalcitonin:4,lacticidven:4)  ) Recent Results (from the past 240 hour(s))  Blood Culture (routine x 2)  Status: None (Preliminary result)   Collection Time: 06/30/20  8:31 AM   Specimen:  BLOOD LEFT ARM  Result Value Ref Range Status   Specimen Description   Final    BLOOD LEFT ARM Performed at [facility] , [address] [place], [place] [address]    Special Requests   Final    BOTTLES DRAWN AEROBIC AND ANAEROBIC Blood Culture adequate volume Performed at [facility], [address] [place], [place] [address]    Culture   Final    NO GROWTH 4 DAYS Performed at [facility], [address], [place], [place] [address]    Report Status PENDING  Incomplete  Urine culture     Status: None   Collection Time: 06/30/20  8:32 AM   Specimen: In/Out Cath Urine  Result Value Ref Range Status   Specimen Description   Final    IN/OUT CATH URINE Performed at [facility], [address] [place], [place] [address]    Special Requests   Final    NONE Performed at [facility], [address] [place], [place] [address]    Culture   Final    NO GROWTH Performed at [facility], [address] [place], [place] [address]    Report Status 07/01/2020 FINAL  Final  Blood Culture (routine x 2)     Status: None (Preliminary result)   Collection Time: 06/30/20  9:00 AM   Specimen: BLOOD LEFT HAND  Result Value Ref Range Status   Specimen Description   Final    BLOOD LEFT HAND Performed at [facility], [address] [place], [place] [address]    Special Requests   Final    BOTTLES DRAWN AEROBIC AND ANAEROBIC Blood Culture adequate volume Performed at [facility], [address], [place], [place] [address]    Culture   Final    NO GROWTH 4 DAYS Performed at [facility], [place] [address] [place], [place] [address]    Report Status PENDING  Incomplete  Resp Panel by RT-PCR (Flu A&B, Covid) Nasopharyngeal Swab     Status: None   Collection Time: 06/30/20 10:34 AM   Specimen: Nasopharyngeal Swab; Nasopharyngeal(NP) swabs in vial transport medium  Result Value Ref Range Status   SARS  Coronavirus 2 by RT PCR NEGATIVE NEGATIVE Final    Comment: (NOTE) SARS-CoV-2 target nucleic acids are NOT DETECTED.  The SARS-CoV-2 RNA is generally detectable in upper respiratory specimens during the acute phase of infection. The lowest concentration of SARS-CoV-2 viral copies this assay can detect is 138 copies/mL. A negative result does not preclude SARS-Cov-2 infection and should not be used as the sole basis for treatment or other patient management decisions. A negative result may occur with  improper specimen collection/handling, submission of specimen other than nasopharyngeal swab, presence of viral mutation(s) within the areas targeted by this assay, and inadequate number of viral copies(<138 copies/mL). A negative result must be combined with clinical observations, patient history, and epidemiological information. The expected result is Negative.  Fact Sheet for Patients:  EntrepreneurPulse.com.au  Fact Sheet for Healthcare Providers:  IncredibleEmployment.be  This test is no t yet approved or cleared by the [place] FDA and  has been authorized for detection and/or diagnosis of SARS-CoV-2 by FDA under an Emergency Use Authorization (EUA). This EUA will remain  in effect (meaning this test can  be used) for the duration of the COVID-19 declaration under Section 564(b)(1) of the Act, 21 U.S.C.section 360bbb-3(b)(1), unless the authorization is terminated  or revoked sooner.       Influenza A by PCR NEGATIVE NEGATIVE Final   Influenza B by PCR NEGATIVE NEGATIVE Final    Comment: (NOTE) The Xpert Xpress SARS-CoV-2/FLU/RSV plus assay is intended as an aid in the diagnosis of influenza from Nasopharyngeal swab specimens and should not be used as a sole basis for treatment. Nasal washings and aspirates are unacceptable for Xpert Xpress SARS-CoV-2/FLU/RSV testing.  Fact Sheet for  Patients: BloggerCourse.com  Fact Sheet for Healthcare Providers: SeriousBroker.it  This test is not yet approved or cleared by the [place] FDA and has been authorized for detection and/or diagnosis of SARS-CoV-2 by FDA under an Emergency Use Authorization (EUA). This EUA will remain in effect (meaning this test can be used) for the duration of the COVID-19 declaration under Section 564(b)(1) of the Act, 21 U.S.C. section 360bbb-3(b)(1), unless the authorization is terminated or revoked.  Performed at [facility], [address] [place], [place] [address]   CSF culture     Status: None (Preliminary result)   Collection Time: 07/03/20  4:10 PM   Specimen: PATH Cytology CSF; Cerebrospinal Fluid  Result Value Ref Range Status   Specimen Description CSF  Final   Special Requests NONE  Final   Gram Stain   Final    WBC PRESENT, PREDOMINANTLY MONONUCLEAR NO ORGANISMS SEEN CYTOSPIN SMEAR    Culture   Final    NO GROWTH < 24 HOURS Performed at [facility], [address], [place], [place] [address]    Report Status PENDING  Incomplete      Radiology Studies: DG FL GUIDED LUMBAR PUNCTURE  Result Date: 07/03/2020 CLINICAL DATA:  Mental status changes. EXAM: DIAGNOSTIC LUMBAR PUNCTURE UNDER FLUOROSCOPIC GUIDANCE FLUOROSCOPY TIME:  Fluoroscopy Time:  0 minutes and 18 seconds. Radiation Exposure Index (if provided by the fluoroscopic device): 8.8 mGy Number of Acquired Spot Images: 0 PROCEDURE: Informed consent was obtained from the patient prior to the procedure, including potential complications of bleeding, infection, headache, allergy, and pain. With the patient prone, the lower back was prepped with Betadine. 1% Lidocaine was used for local anesthesia. Lumbar puncture was performed at the L4-5 level using a 20 gauge needle with return of clear CSF. 9.0 ml of CSF were obtained for laboratory studies. The patient  tolerated the procedure well and there were no apparent complications. IMPRESSION: Fluoroscopic guided lumbar puncture with 9 cc of clear CSF obtained for appropriate laboratory evaluation. Electronically Signed   By: Rudie Meyer M.D.   On: 07/03/2020 17:01     Scheduled Meds: . aspirin EC  81 mg Oral Daily  . Chlorhexidine Gluconate Cloth  6 each Topical Daily  . cyanocobalamin  1,000 mcg Intramuscular Daily   Followed by  . [START ON 07/12/2020] cyanocobalamin  1,000 mcg Intramuscular Q Thu   Followed by  . [START ON 08/30/2020] cyanocobalamin  1,000 mcg Intramuscular Q28 days  . naproxen  500 mg Oral BID WC  . pantoprazole (PROTONIX) IV  40 mg Intravenous Q12H  . QUEtiapine  25 mg Oral QHS  . rosuvastatin  20 mg Oral QHS  . tamsulosin  0.4 mg Oral Daily  . [START ON 07/10/2020] thiamine injection  100 mg Intravenous Daily   Continuous Infusions: . sodium chloride 10 mL/hr at 07/03/20 0534  . dextrose 5 % and  0.45% NaCl 75 mL/hr at 07/04/20 0500  . thiamine injection 250 mg (07/04/20 1151)     LOS: 3 days   Time Spent in minutes   45 minutes  Oluwatobi Visser D.O. on 07/04/2020 at 1:11 PM  Between 7am to 7pm - Please see pager noted on [email or web address]  After 7pm go to [email or web address]  And look for the night coverage person covering for me after hours  [facility] Office  [phone]

## 2020-07-04 NOTE — Progress Notes (Signed)
   07/04/20 1419  Restraint Order  Length of Order Daily  Assessment  Less Restrictive Interventions Attempted Yes  Health history reviewed prior to applying restraint Yes  Justification  Clinical Justification Pulling lines;Pulling tubes;Removal of equipment;Removal of dressing  Plan to Progress Out Family participation  Education  Family Notification Family at bedside (son(Brandon))  Restraint Every 2 Hour Monitoring  Airway Clear with Spontaneous Respirations Yes  Circulation / Skin Integrity No signs of injury;Restraint applied correctly  Emotional / Mental Status Agitated/restless;Confused;Delusional  Range of Motion Other (Comment)  Food and Fluids Fluid/Food offered  Elimination Urethral catheter  Patient's rights, dignity, safety maintained Yes  Can Restraints be Less Restrictive or Discontinued? No  Medical Device Prophylactic Dressing Under Restraints  Restraints - Date Prophylactic Dressing Applied (if applicable) 07/04/20  Skin Assessed Under All Medical Device Prophylactic Dressings (if applicable) Done - Skin Intact

## 2020-07-04 NOTE — Progress Notes (Signed)
Physical Therapy Treatment Patient Details Name: Patrick Macias MRN: [id number] DOB: May 24, 1941 Today's Date: 07/04/2020    History of Present Illness Pt adm with AMS, fever of unknown origin and rash/hives. Brain MRI negative for acute abnormality. ? gout.  PMH - dementia, HTN, ?TIA.    PT Comments     Pt with progression towards physical therapy goals in comparison to yesterday; improved alertness and participatory in session. Pt requiring minimal assist to stand, ambulating x 30 feet with a walker and moderate assist for balance (+2 equipment). Pt continues with poor cognition, balance deficits, and decreased coordination. Pt family hopeful with sedating medications discontinued, pt will continue to improve. Will follow acutely to reassess.    Follow Up Recommendations  Home health PT;Supervision/Assistance - 24 hour (pending improved cognition)     Equipment Recommendations  3in1 (PT);Wheelchair (measurements PT);Wheelchair cushion (measurements PT);Rolling walker with 5" wheels    Recommendations for Other Services       Precautions / Restrictions Precautions Precautions: Fall Restrictions Weight Bearing Restrictions: No    Mobility  Bed Mobility Overal bed mobility: Needs Assistance Bed Mobility: Supine to Sit     Supine to sit: Min assist     General bed mobility comments: Light minA for trunk to upright. Increased time for processing  Transfers Overall transfer level: Needs assistance Equipment used: Rolling walker (2 wheeled) Transfers: Sit to/from Stand Sit to Stand: Min assist         General transfer comment: MinA to rise from edge of bed  Ambulation/Gait Ambulation/Gait assistance: Mod assist;+2 safety/equipment Gait Distance (Feet): 30 Feet Assistive device: Rolling walker (2 wheeled) Gait Pattern/deviations: Step-through pattern;Decreased stride length;Drifts right/left;Narrow base of support Gait velocity: decreased Gait velocity  interpretation: <1.31 ft/sec, indicative of household ambulator General Gait Details: Pt with decreased coordination and trunk instability, requiring modA and +2 for equipment.   Stairs             Wheelchair Mobility    Modified Rankin (Stroke Patients Only)       Balance Overall balance assessment: Needs assistance Sitting-balance support: No upper extremity supported;Feet supported Sitting balance-Leahy Scale: Fair     Standing balance support: Bilateral upper extremity supported;During functional activity Standing balance-Leahy Scale: Poor                              Cognition Arousal/Alertness: Awake/alert Behavior During Therapy: Flat affect Overall Cognitive Status: Impaired/Different from baseline Area of Impairment: Following commands;Orientation;Attention;Safety/judgement;Awareness;Problem solving                 Orientation Level: Disoriented to;Place;Time;Situation Current Attention Level: Focused;Sustained Memory: Decreased recall of precautions;Decreased short-term memory Following Commands: Follows one step commands inconsistently Safety/Judgement: Decreased awareness of safety;Decreased awareness of deficits Awareness: Intellectual Problem Solving: Difficulty sequencing;Decreased initiation;Requires tactile cues;Requires verbal cues General Comments: Pt alert, follows ~50% of 1 step commands, but requires repetition. Not able to recognize wife/daughter in the room. At times, stating random words such as colors and numbers. 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 Stating he is from [place], but pt wife reports this is confabulatory.      Exercises General Exercises - Lower Extremity Long Arc Quad: Both;10 reps;Seated Other Exercises Other Exercises: x3 Sit to Stands Other Exercises: Sitting: functional reaching with RUE for objects for hand-eye coordination    General Comments         Pertinent  Vitals/Pain Pain Assessment: Faces Faces Pain Scale: No hurt    Home Living                      Prior Function            PT Goals (current goals can now be found in the care plan section) Acute Rehab PT Goals Patient Stated Goal: get up PT Goal Formulation: With patient/family Time For Goal Achievement: 07/15/20 Potential to Achieve Goals: Good Progress towards PT goals: Progressing toward goals    Frequency    Min 3X/week      PT Plan Current plan remains appropriate    Co-evaluation              AM-PAC PT "6 Clicks" Mobility   Outcome Measure  Help needed turning from your back to your side while in a flat bed without using bedrails?: A Little Help needed moving from lying on your back to sitting on the side of a flat bed without using bedrails?: A Little Help needed moving to and from a bed to a chair (including a wheelchair)?: A Lot Help needed standing up from a chair using your arms (e.g., wheelchair or bedside chair)?: A Little Help needed to walk in hospital room?: A Lot Help needed climbing 3-5 steps with a railing? : Total 6 Click Score: 14    End of Session Equipment Utilized During Treatment: Gait belt Activity Tolerance: Patient tolerated treatment well Patient left: with call bell/phone within reach;in bed;with nursing/sitter in room;with restraints reapplied Nurse Communication: Mobility status PT Visit Diagnosis: Unsteadiness on feet (R26.81);Other abnormalities of gait and mobility (R26.89);Muscle weakness (generalized) (M62.81)     Time: 7062-3762 PT Time Calculation (min) (ACUTE ONLY): 44 min  Charges:  $Therapeutic Activity: 38-52 mins                     Patrick Macias, PT, DPT Acute Rehabilitation Services Pager [phone] Office [phone]    Patrick Macias 07/04/2020, 1:22 PM

## 2020-07-04 NOTE — Plan of Care (Signed)
The patient is currently sleeping in no acute distress. VS remain WNL. He is no longer in restraints. When awake he is anxious and restless but cooperative. Pt. Still not voiding on his own. PRN Ativan given x1 with effective results. PRN morphine given for pain post lumbar punture x1 with effective results. Brynda Rim, RN

## 2020-07-04 NOTE — Progress Notes (Signed)
Spoke to patient's daughter and son concerns about safety precautions that has been implemented by the nursing staff. Reassure the family about the soft mitts and 4 points restraints that were used was for the safety of the patient, family and staff at that point of time until the patient is less agitated. Patient displayed agitation of trying to get out of bed, biting, hitting, and kicking the nursing staff prior to restraint application. MD made aware of the agitation prior. Less restraints application was administered prior to the restraints such as bed alarms, frequent staff presence, family presence, sock over IV and shirt of tele monitor to refrain from pulling current IV out. Engineering geologist made aware. Oncoming RN and Audiological scientist made aware.

## 2020-07-04 NOTE — Progress Notes (Signed)
   07/04/20 1124  Urine Characteristics  Bladder Scan Volume (mL) 254 mL

## 2020-07-04 NOTE — Progress Notes (Signed)
The patient remained calm and slept through the night. He woke up briefly this moring and RN asked if he wanted some water. The patient drank approximately 60cc of water. No coughing or choking noted. He has not urinated, RN bladder scanned x2 and result was 232cc. Currently sleeping in no distress. Brynda Rim, RN

## 2020-07-05 ENCOUNTER — Other Ambulatory Visit: Payer: Medicare HMO

## 2020-07-05 ENCOUNTER — Inpatient Hospital Stay (HOSPITAL_COMMUNITY): Payer: Medicare HMO

## 2020-07-05 DIAGNOSIS — I10 Essential (primary) hypertension: Secondary | ICD-10-CM | POA: Diagnosis not present

## 2020-07-05 DIAGNOSIS — R509 Fever, unspecified: Secondary | ICD-10-CM | POA: Diagnosis not present

## 2020-07-05 DIAGNOSIS — R338 Other retention of urine: Secondary | ICD-10-CM | POA: Diagnosis not present

## 2020-07-05 LAB — HSV 1/2 PCR, CSF
HSV-1 DNA: NEGATIVE
HSV-2 DNA: NEGATIVE

## 2020-07-05 LAB — CULTURE, BLOOD (ROUTINE X 2)
Culture: NO GROWTH
Culture: NO GROWTH
Special Requests: ADEQUATE
Special Requests: ADEQUATE

## 2020-07-05 LAB — VITAMIN B1: Vitamin B1 (Thiamine): 103.3 nmol/L (ref 66.5–200.0)

## 2020-07-05 LAB — CBC
HCT: 29 % — ABNORMAL LOW (ref 39.0–52.0)
Hemoglobin: 10.2 g/dL — ABNORMAL LOW (ref 13.0–17.0)
MCH: 34.3 pg — ABNORMAL HIGH (ref 26.0–34.0)
MCHC: 35.2 g/dL (ref 30.0–36.0)
MCV: 97.6 fL (ref 80.0–100.0)
Platelets: 294 10*3/uL (ref 150–400)
RBC: 2.97 MIL/uL — ABNORMAL LOW (ref 4.22–5.81)
RDW: 12.5 % (ref 11.5–15.5)
WBC: 3.8 10*3/uL — ABNORMAL LOW (ref 4.0–10.5)
nRBC: 0 % (ref 0.0–0.2)

## 2020-07-05 LAB — LYME DISEASE DNA BY PCR(BORRELIA BURG): Lyme Disease(B.burgdorferi)PCR: NEGATIVE

## 2020-07-05 LAB — BASIC METABOLIC PANEL
Anion gap: 10 (ref 5–15)
BUN: 11 mg/dL (ref 8–23)
CO2: 25 mmol/L (ref 22–32)
Calcium: 8.3 mg/dL — ABNORMAL LOW (ref 8.9–10.3)
Chloride: 105 mmol/L (ref 98–111)
Creatinine, Ser: 0.84 mg/dL (ref 0.61–1.24)
GFR, Estimated: >60 mL/min (ref >60)
Glucose, Bld: 99 mg/dL (ref 70–99)
Potassium: 3.6 mmol/L (ref 3.5–5.1)
Sodium: 140 mmol/L (ref 135–145)

## 2020-07-05 LAB — CMV DNA BY PCR, QUALITATIVE: CMV DNA, Qual PCR: NEGATIVE

## 2020-07-05 IMAGING — DX DG CHEST 1V PORT
1 series · 1 of 1 positions shown · non-contrast
Comparison: Chest x-ray [DATE].

CLINICAL DATA: Abnormal breath sounds.

EXAM:
PORTABLE CHEST 1 VIEW

[chest]
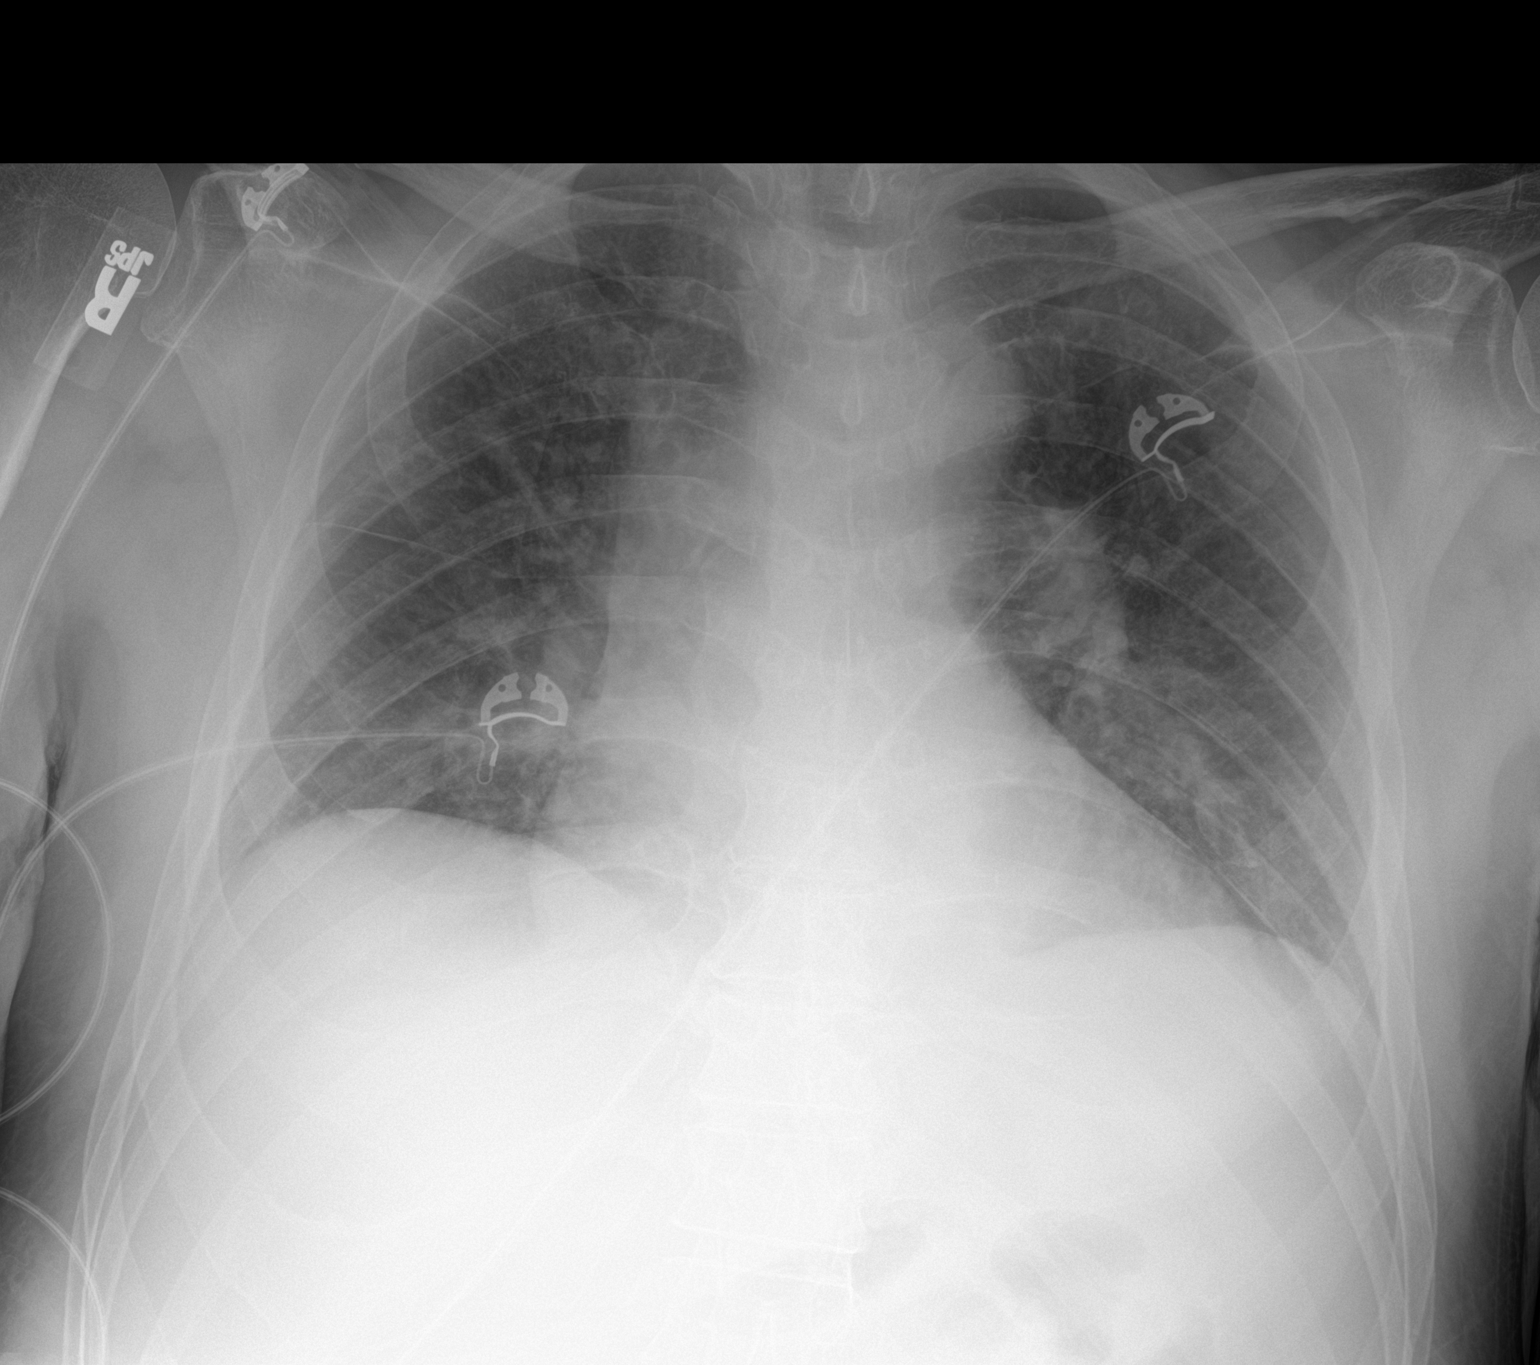

[1 of 1 positions shown; findings below may reference images not displayed]

FINDINGS: Prominence of the mediastinum most likely related to prominent great
vessels and AP technique. Thoracic aorta is tortuous. Cardiomegaly.
Low lung volumes with bibasilar atelectasis. Mild bilateral
interstitial prominence. Interstitial edema and/or pneumonitis could
present in this fashion. No pleural effusion or pneumothorax.
IMPRESSION: 1. Cardiomegaly.
2. Low lung volumes with bibasilar atelectasis. Mild bilateral
interstitial prominence. Interstitial edema and/or pneumonitis could
present in this fashion. No pleural effusion or pneumothorax.

## 2020-07-05 MED ORDER — MUSCLE RUB 10-15 % EX CREA
TOPICAL_CREAM | CUTANEOUS | Status: DC | PRN
  Filled 2020-07-05: qty 85

## 2020-07-05 MED ORDER — OLANZAPINE 5 MG PO TBDP
5.0000 mg | ORAL_TABLET | Freq: Every day | ORAL | Status: DC
  Filled 2020-07-05: qty 1

## 2020-07-05 MED ORDER — BISACODYL 5 MG PO TBEC
5.0000 mg | DELAYED_RELEASE_TABLET | Freq: Every day | ORAL | Status: DC | PRN
  Administered 2020-07-06 – 2020-07-07 (×2): 5 mg via ORAL
  Filled 2020-07-05 (×2): qty 1

## 2020-07-05 MED ORDER — OLANZAPINE 5 MG PO TBDP
2.5000 mg | ORAL_TABLET | Freq: Every day | ORAL | Status: DC
Start: 2020-07-05 — End: 2020-07-08
  Administered 2020-07-05 – 2020-07-07 (×3): 2.5 mg via ORAL
  Filled 2020-07-05 (×5): qty 0.5

## 2020-07-05 MED ORDER — HALOPERIDOL LACTATE 5 MG/ML IJ SOLN
5.0000 mg | Freq: Once | INTRAMUSCULAR | Status: AC | PRN
  Administered 2020-07-05: 5 mg via INTRAVENOUS
  Filled 2020-07-05: qty 1

## 2020-07-05 MED ORDER — MORPHINE SULFATE (PF) 2 MG/ML IV SOLN
2.0000 mg | INTRAVENOUS | Status: DC | PRN
  Administered 2020-07-06: 2 mg via INTRAVENOUS
  Filled 2020-07-05 (×2): qty 1

## 2020-07-05 MED ORDER — TRAMADOL HCL 50 MG PO TABS
50.0000 mg | ORAL_TABLET | Freq: Four times a day (QID) | ORAL | Status: DC | PRN
  Administered 2020-07-05 – 2020-07-06 (×5): 50 mg via ORAL
  Filled 2020-07-05 (×5): qty 1

## 2020-07-05 MED ORDER — CAMPHOR-MENTHOL 0.5-0.5 % EX LOTN
1.0000 "application " | TOPICAL_LOTION | CUTANEOUS | Status: DC | PRN
  Filled 2020-07-05 (×2): qty 222

## 2020-07-05 MED ORDER — FUROSEMIDE 10 MG/ML IJ SOLN
10.0000 mg | Freq: Once | INTRAMUSCULAR | Status: AC
  Administered 2020-07-05: 10 mg via INTRAVENOUS
  Filled 2020-07-05: qty 2

## 2020-07-05 MED ORDER — POTASSIUM CHLORIDE CRYS ER 20 MEQ PO TBCR
40.0000 meq | EXTENDED_RELEASE_TABLET | Freq: Once | ORAL | Status: AC
  Administered 2020-07-05: 40 meq via ORAL
  Filled 2020-07-05: qty 2

## 2020-07-05 NOTE — Consult Note (Signed)
Patrick Macias is a 80 y.o. male with hx of vascular dementia, BPH, dyslipidemia, hypertension, who presented to the facility on 06/30/2020 secondary to altered mental status, generalized weakness, fever.  At the time of exam, patient is cephalopathic, thus a complete and thorough HPI review systems unable to be fully obtained.  Information obtained from chart and daughter and wife bedside.  Apparently on Monday, patient developed hives on different parts of his body that on his body but was primarily his back, not in 1 dermatome.  He tried to take Zyrtec with no improvement of his rash.  He had been sleeping well due to the rash.  Then apparently yesterday he hit his hand on something but family does not know what.  Then the morning presentation, patient had a fever, and wife found him in bed significantly altered, encephalopathic, with inability to get out of bed.  He had abnormal speech, shaking, and was not a self and thus EMS was contacted to bring him.  Of note, patient was recently hospitalized in the beginning of December 2021 for encephalopathy.  MRI did not show a CVA but showed findings concerning with chronic microvascular ischemia.  Neurology was consulted and evaluated patient.  Per family there was discussion that he has vascular dementia.  He was scheduled to follow-up with neurology outpatient.  Psychogenic drugs he is on are Lexapro, and galantamine.    Psychiatric consult place for delirium on dementia, has been worsening over the past month since he had a TIA.  Patient is seen and assessed by this nurse practitioner.  Patient is observed to be lying in bed disoriented, incomprehensible speech noted at times.  His daughter [name] was at the bedside and was able to provide detailed information leading up to this current admission.  Nursing staff was also at the bedside administering medication via IV. She has some concerns regarding it on and off that delirium and likelihood of  infection.  She states she has some colleagues who are affiliated with [employer], and have educated her on delirium and making sure we have the appropriate treatment protocol.  We did review delirium and the treatment protocol and delirium precautions which we will initiate at this time.  We also developed a treatment plan and which she agreed upon.  See below.  Daughter reports patient was working and driving up until last week, New Year's Eve prior to him developing symptoms of confusion.  She does have ongoing concern regarding the acute process in which the delirium occurred.  We also discussed her father's response to Ativan, and him staying up over 24 hours once it is administered.  We reviewed in depth, pharmacological effects of benzodiazepines in a geriatric population in which we make our best attempts to avoid use.  Writer also discussed patient's behavior and restlessness versus agitation and aggression and the need for as needed medication.  We explained that some of the restlessness on normal behaviors, and supportive care is essential.  Overall weakness suspect his delirium is an acute process that should resolve quickly with appropriate medical treatment interventions. Patient likely has ongoing delirium which presents as fluctuating cognition.  Careful management of pharmacological therapy, space-time reorientation, early mobilization, minimization of restraint use, and adequate sleep hygiene is the most recommended option for preventing delirium in hospitalized patients.    Exam is notable for somnolence, disorientation, and cognitive deficits, that appear markedly different than their baseline, suggesting a diagnosis of delirium. The patient's exam is notable for altered  sensorium, perceptual disturbances, disorientation and cognitive deficits that appear markedly different than their baseline, suggesting a diagnosis of delirium.  Virtually any medical condition or physiologic stress  can precipitate delirium in a susceptible individual, with risk increasing in those with: advanced age, sensory impairments, organic brain disease (stroke, dementia, Parkinsons), psychiatric illness, major chronic medical issues, prolonged hospitalizations, postoperative status, anemia, insomnia/disturbed sleep, and severe pain. Addressing the underlying medical condition and institution of preventative measures are recommended.  - Continue to monitor and treat underlying medical causes of delirium, including infection, electrolyte disturbances, etc. - Delirium precautions - Minimize/avoid deliriogenic meds including: anticholinergic, opiates, benzodiazepines           - Maintain hydration, oxygenation, nutrition           - Limit use of restraints and catheters           - Normalize sleep patterns by minimizing nighttime noise, light and interruptions by     -Ensure sleep apnea treatment is provided overnight.             clustering care, opening blinds during the day           - Reorient the patient frequently, provide easily visible clock and calendar           - Provide sensory aids like glasses, hearing aids           - Encourage ambulation, regular activities and visitors to maintain cognitive stimulation   -Patient would benefit from having family members at bedside to reinforce his orientation.   Treatment:  -discontinue Seroquel at this time.  -Will also discontinue any additional prn medications to include Ativan, as it is suspected that he is having a paradoxical reaction notable confusion, worsening restlessness, and agitation.  -Will initiate Zyprexa Zydis 2.5 mg p.o. nightly for treatment of confusion, agitation, and delirium. It is with hope to restore is circadian rhythm.   Please limit use multiple antipsychotic medication in the elderly population, in addition patient is nave to antipsychotics therefore we will start low and go slow. -We will adjust Haldol 5 mg IV every 6 to  Haldol 5 mg once as needed. -If patient begins to exhibit EPS symptoms, will benefit from adding Benadryl or hydroxyzine.  However these medications do have anticholinergic side effects patient is currently being treated for urinary retention with limited usage of these medications as well. -EKG obtained, QTC 440 within normal limits.  Will need to hold Zyprexa if QTC is greater than 500 at any time.    Patient does not meet inpatient criteria at this time.. -Thank you for consult to psychiatry.  Will follow up tomorrow to observe in initial response to Zyprexa Zydis.

## 2020-07-05 NOTE — Consult Note (Signed)
Urology Consult    Physician requesting consult: Edsel Petrin, DO  Reason for consult: Acute urinary retention  History of Present Illness: Patrick Macias is a 80 y.o. seen in consultation for acute urinary retention.  He presented to the hospital on 06/30/2020 with altered mental status, generalized weakness and fever.  Of note, he does have a history of vascular dementia.  His current working diagnosis is acute metabolic encephalopathy/acute delirium with underlying vascular dementia.  He continues to be altered and history is limited from the patient.  The rest of the history is obtained from his daughter.  Apparently, he has a long history of BPH and follows with Dr. Carlos American in [facility].  He reportedly underwent a TURP approximately 10 years ago.  He is also been followed with an elevated PSA which was around 6 this past year.  His daughter states that he had a biopsy of his prostate within the last year that was reportedly benign.  Prior to presentation, daughter thinks that he has had a weaker flow of stream.  She is unable to provide any further details regarding his prior urination status.  Over this hospitalization, he has had several in and out catheterizations.  Finally, Foley cath was placed on 07/04/2020.  Tamsulosin 0.4 mg has been started.  Urinalysis 07/04/2020 with negative nitrate, leukocyte esterase, only rare bacteria.  No sign of infection.  No leukocytosis.  His creatinine is appropriate at 0.84.  Past Medical History:  Diagnosis Date  . Anxiety   . BPH (benign prostatic hyperplasia)   . Cerumen impaction   . Dyslipidemia   . H/O lithotripsy   . Hearing decreased, bilateral   . Hx of colonic polyps   . Hypertension   . IFG (impaired fasting glucose)   . Macrocytosis   . Nephrolithiasis   . Vitamin D deficiency     Past Surgical History:  Procedure Laterality Date  . CATARACT EXTRACTION, BILATERAL    . PROSTATECTOMY      Medications:  Home meds:  No  current facility-administered medications on file prior to encounter.   Current Outpatient Medications on File Prior to Encounter  Medication Sig Dispense Refill  . acetaminophen (TYLENOL) 500 MG tablet Take 1,000 mg by mouth every 6 (six) hours as needed for headache (pain).    Marland Kitchen aspirin EC 81 MG tablet Take 1 tablet (81 mg total) by mouth daily. 90 tablet 3  . cetirizine (ZYRTEC) 10 MG tablet Take 10 mg by mouth daily as needed (allergic reaction/hives).    . cholecalciferol (VITAMIN D3) 25 MCG (1000 UNIT) tablet Take 1,000 Units by mouth daily.    Marland Kitchen escitalopram (LEXAPRO) 20 MG tablet Take 20 mg by mouth daily.    Marland Kitchen galantamine (RAZADYNE ER) 16 MG 24 hr capsule Take 16 mg by mouth daily with breakfast.    . lisinopril-hydrochlorothiazide (ZESTORETIC) 20-25 MG tablet Take 1 tablet by mouth daily.    Marland Kitchen loratadine (CLARITIN) 10 MG tablet Take 10 mg by mouth daily as needed (allergic reaction/hives).    . rosuvastatin (CRESTOR) 20 MG tablet Take 20 mg by mouth daily.    . clopidogrel (PLAVIX) 75 MG tablet Take 75 mg by mouth daily.       Scheduled Meds: . aspirin EC  81 mg Oral Daily  . Chlorhexidine Gluconate Cloth  6 each Topical Daily  . cyanocobalamin  1,000 mcg Intramuscular Daily   Followed by  . [START ON 07/12/2020] cyanocobalamin  1,000 mcg Intramuscular Q Thu  Followed by  . [START ON 08/30/2020] cyanocobalamin  1,000 mcg Intramuscular Q28 days  . lidocaine  1 application Urethral Once  . naproxen  500 mg Oral BID WC  . pantoprazole (PROTONIX) IV  40 mg Intravenous Q12H  . potassium chloride  40 mEq Oral Once  . QUEtiapine  25 mg Oral QHS  . rosuvastatin  20 mg Oral QHS  . tamsulosin  0.4 mg Oral Daily  . [START ON 07/10/2020] thiamine injection  100 mg Intravenous Daily   Continuous Infusions: . sodium chloride 10 mL/hr at 07/03/20 0534  . thiamine injection 250 mg (07/05/20 0835)   PRN Meds:.sodium chloride, acetaminophen **OR** acetaminophen, albuterol, bisacodyl,  haloperidol lactate, LORazepam, LORazepam, melatonin, morphine injection, ondansetron **OR** ondansetron (ZOFRAN) IV, traMADol  Allergies: No Known Allergies  Family History  Problem Relation Age of Onset  . Sudden death Neg Hx   . Hypertension Neg Hx   . Hyperlipidemia Neg Hx   . Diabetes Neg Hx   . Heart attack Neg Hx     Social History:  reports that he has never smoked. He has never used smokeless tobacco. He reports previous alcohol use. He reports that he does not use drugs.  ROS: A complete review of systems was performed.  All systems are negative except for pertinent findings as noted.  Physical Exam:  Vital signs in last 24 hours: Temp:  [97.7 F (36.5 C)-99 F (37.2 C)] 99 F (37.2 C) (01/06 0551) Pulse Rate:  [54-57] 57 (01/06 0551) Resp:  [8-26] 18 (01/06 0551) BP: (140-143)/(67-70) 143/70 (01/06 0551) SpO2:  [93 %-98 %] 93 % (01/06 0551) Constitutional:  Alert and oriented, No acute distress Cardiovascular: Regular rate and rhythm Respiratory: Normal respiratory effort, Lungs clear bilaterally GI: Abdomen is soft, nontender, nondistended, no abdominal masses Genitourinary: No CVAT.  Foley catheter in place draining clear yellow urine  neurologic: Grossly intact, no focal deficits Psychiatric: Normal mood and affect  Laboratory Data:  Recent Labs    07/03/20 0511 07/04/20 0238 07/05/20 0709  WBC 6.3 4.5 3.8*  HGB 9.6* 9.9* 10.2*  HCT 29.1* 29.3* 29.0*  PLT 209 257 294    Recent Labs    07/03/20 0511 07/04/20 0238 07/05/20 0709  NA 139 138 140  K 3.3* 3.3* 3.6  CL 106 105 105  GLUCOSE 122* 118* 99  BUN 22 18 11   CALCIUM 8.2* 8.1* 8.3*  CREATININE 0.93 0.78 0.84     Results for orders placed or performed during the hospital encounter of 06/30/20 (from the past 24 hour(s))  Basic metabolic panel     Status: Abnormal   Collection Time: 07/05/20  7:09 AM  Result Value Ref Range   Sodium 140 135 - 145 mmol/L   Potassium 3.6 3.5 - 5.1 mmol/L    Chloride 105 98 - 111 mmol/L   CO2 25 22 - 32 mmol/L   Glucose, Bld 99 70 - 99 mg/dL   BUN 11 8 - 23 mg/dL   Creatinine, Ser 0.84 0.61 - 1.24 mg/dL   Calcium 8.3 (L) 8.9 - 10.3 mg/dL   GFR, Estimated >60 >60 mL/min   Anion gap 10 5 - 15  CBC     Status: Abnormal   Collection Time: 07/05/20  7:09 AM  Result Value Ref Range   WBC 3.8 (L) 4.0 - 10.5 K/uL   RBC 2.97 (L) 4.22 - 5.81 MIL/uL   Hemoglobin 10.2 (L) 13.0 - 17.0 g/dL   HCT 29.0 (L) 39.0 - 52.0 %  MCV 97.6 80.0 - 100.0 fL   MCH 34.3 (H) 26.0 - 34.0 pg   MCHC 35.2 30.0 - 36.0 g/dL   RDW 24.2 35.3 - 61.4 %   Platelets 294 150 - 400 K/uL   nRBC 0.0 0.0 - 0.2 %   Recent Results (from the past 240 hour(s))  Blood Culture (routine x 2)     Status: None   Collection Time: 06/30/20  8:31 AM   Specimen: BLOOD LEFT ARM  Result Value Ref Range Status   Specimen Description   Final    BLOOD LEFT ARM Performed at [facility], [address] [place], [place] [address]    Special Requests   Final    BOTTLES DRAWN AEROBIC AND ANAEROBIC Blood Culture adequate volume Performed at [facility], [address], [place], [place] [address]    Culture   Final    NO GROWTH 5 DAYS Performed at [facility], [address] [place], [place] [address]    Report Status 07/05/2020 FINAL  Final  Urine culture     Status: None   Collection Time: 06/30/20  8:32 AM   Specimen: In/Out Cath Urine  Result Value Ref Range Status   Specimen Description   Final    IN/OUT CATH URINE Performed at [facility], [address] [place], [place] [address]    Special Requests   Final    NONE Performed at [facility], [address] [place], [place] [address]    Culture   Final    NO GROWTH Performed at [facility], [address], [place], [place] [address]    Report Status 07/01/2020 FINAL  Final  Blood Culture (routine x 2)     Status: None   Collection Time: 06/30/20  9:00 AM    Specimen: BLOOD LEFT HAND  Result Value Ref Range Status   Specimen Description   Final    BLOOD LEFT HAND Performed at [facility], [address] [place], [place] [address]    Special Requests   Final    BOTTLES DRAWN AEROBIC AND ANAEROBIC Blood Culture adequate volume Performed at [facility], [address] [place], [place] [address]    Culture   Final    NO GROWTH 5 DAYS Performed at [facility], [address], [place], [place] [address]    Report Status 07/05/2020 FINAL  Final  Resp Panel by RT-PCR (Flu A&B, Covid) Nasopharyngeal Swab     Status: None   Collection Time: 06/30/20 10:34 AM   Specimen: Nasopharyngeal Swab; Nasopharyngeal(NP) swabs in vial transport medium  Result Value Ref Range Status   SARS Coronavirus 2 by RT PCR NEGATIVE NEGATIVE Final    Comment: (NOTE) SARS-CoV-2 target nucleic acids are NOT DETECTED.  The SARS-CoV-2 RNA is generally detectable in upper respiratory specimens during the acute phase of infection. The lowest concentration of SARS-CoV-2 viral copies this assay can detect is 138 copies/mL. A negative result does not preclude SARS-Cov-2 infection and should not be used as the sole basis for treatment or other patient management decisions. A negative result may occur with  improper specimen collection/handling, submission of specimen other than nasopharyngeal swab, presence of viral mutation(s) within the areas targeted by this assay, and inadequate number of viral copies(<138 copies/mL). A negative result must be combined with clinical observations, patient history, and epidemiological information. The expected  result is Negative.  Fact Sheet for Patients:  EntrepreneurPulse.com.au  Fact Sheet for Healthcare Providers:  IncredibleEmployment.be  This test is no t yet approved or cleared by the [place] FDA and  has been authorized for detection and/or diagnosis of  SARS-CoV-2 by FDA under an Emergency Use Authorization (EUA). This EUA will remain  in effect (meaning this test can be used) for the duration of the COVID-19 declaration under Section 564(b)(1) of the Act, 21 U.S.C.section 360bbb-3(b)(1), unless the authorization is terminated  or revoked sooner.       Influenza A by PCR NEGATIVE NEGATIVE Final   Influenza B by PCR NEGATIVE NEGATIVE Final    Comment: (NOTE) The Xpert Xpress SARS-CoV-2/FLU/RSV plus assay is intended as an aid in the diagnosis of influenza from Nasopharyngeal swab specimens and should not be used as a sole basis for treatment. Nasal washings and aspirates are unacceptable for Xpert Xpress SARS-CoV-2/FLU/RSV testing.  Fact Sheet for Patients: EntrepreneurPulse.com.au  Fact Sheet for Healthcare Providers: IncredibleEmployment.be  This test is not yet approved or cleared by the [place] FDA and has been authorized for detection and/or diagnosis of SARS-CoV-2 by FDA under an Emergency Use Authorization (EUA). This EUA will remain in effect (meaning this test can be used) for the duration of the COVID-19 declaration under Section 564(b)(1) of the Act, 21 U.S.C. section 360bbb-3(b)(1), unless the authorization is terminated or revoked.  Performed at [facility], [address] [place], [place] [address]   CSF culture     Status: None (Preliminary result)   Collection Time: 07/03/20  4:10 PM   Specimen: PATH Cytology CSF; Cerebrospinal Fluid  Result Value Ref Range Status   Specimen Description CSF  Final   Special Requests NONE  Final   Gram Stain   Final    WBC PRESENT, PREDOMINANTLY MONONUCLEAR NO ORGANISMS SEEN CYTOSPIN SMEAR    Culture   Final    NO GROWTH 2 DAYS Performed at [facility], [place] [address], [place], [place] [address]    Report Status PENDING  Incomplete    Renal Function: Recent Labs    06/30/20 0831 07/01/20 0323  07/02/20 0417 07/03/20 0511 07/04/20 0238 07/05/20 0709  CREATININE 1.13 1.02 0.96 0.93 0.78 0.84   Estimated Creatinine Clearance: 78.4 mL/min (by C-G formula based on SCr of 0.84 mg/dL).  Radiologic Imaging: DG CHEST PORT 1 VIEW  Result Date: 07/05/2020 CLINICAL DATA:  Abnormal breath sounds. EXAM: PORTABLE CHEST 1 VIEW COMPARISON:  Chest x-ray 06/30/2020. FINDINGS: Prominence of the mediastinum most likely related to prominent great vessels and AP technique. Thoracic aorta is tortuous. Cardiomegaly. Low lung volumes with bibasilar atelectasis. Mild bilateral interstitial prominence. Interstitial edema and/or pneumonitis could present in this fashion. No pleural effusion or pneumothorax. IMPRESSION: 1. Cardiomegaly. 2. Low lung volumes with bibasilar atelectasis. Mild bilateral interstitial prominence. Interstitial edema and/or pneumonitis could present in this fashion. No pleural effusion or pneumothorax. Electronically Signed   By: Marcello Moores  Register   On: 07/05/2020 06:15    I independently reviewed the above imaging studies.  Impression/Recommendation 1. Acute urinary retention: Etiology likely related to acute issues with acute metabolic encephalopathy, delirium as well as BPH.  -Recommend continuing Foley catheter until acute issues resolved.  May proceed with void trial while inpatient after acute issues resolved, he is having regular bowel movements, he is more mobile and mentally alert. -Continue tamsulosin 0.4 mg. -Of unable to successfully pass a void trial in the hospital, he should  follow-up with his urologist, Dr. Terie Purser in [facility].  Or may he may follow-up with [facility] for outpatient void trial. -Discussed extensively with his daughter.  All questions answered to her satisfaction. -We will sign off.  Please call with questions.  Matt R. Reece Mcbroom MD 07/05/2020, 5:11 PM  [facility]  Pager: [phone]   CC: Cristal Ford, DO

## 2020-07-05 NOTE — Progress Notes (Addendum)
Family concerned that pt. Sounds like he is gurguling. Loud snoring and faint wheezing heard upon assessment. On call for [facility] paged to make aware.

## 2020-07-05 NOTE — Progress Notes (Signed)
PROGRESS NOTE    Patrick Macias  [id number] DOB: 06-07-1941 DOA: 06/30/2020 PCP: Crist Infante, MD   Brief Narrative:  HPI on 06/30/2020 by Dr. Atha Starks Patrick Macias is a 80 y.o. male with hx of vascular dementia, BPH, dyslipidemia, hypertension, who presented to the facility on 06/30/2020 secondary to altered mental status, generalized weakness, fever.  At the time of exam, patient is cephalopathic, thus a complete and thorough HPI review systems unable to be fully obtained.  Information obtained from chart and daughter and wife bedside.  Apparently on Monday, patient developed hives on different parts of his body that on his body but was primarily his back, not in 1 dermatome.  He tried to take Zyrtec with no improvement of his rash.  He had been sleeping well due to the rash.  Then apparently yesterday he hit his hand on something but family does not know what.  Then the morning presentation, patient had a fever, and wife found him in bed significantly altered, encephalopathic, with inability to get out of bed.  He had abnormal speech, shaking, and was not a self and thus EMS was contacted to bring him.  Of note, patient was recently hospitalized in the beginning of December 2021 for encephalopathy.  MRI did not show a CVA but showed findings concerning with chronic microvascular ischemia.  Neurology was consulted and evaluated patient.  Per family there was discussion that he has vascular dementia.  He was scheduled to follow-up with neurology outpatient.  Psychogenic drugs he is on are Lexapro, and galantamine.  Interim history Admitted with AMS and fever. Neurology consulted. So far no infectious etiology found. S/p LP, unremarkable. Neurology feels this could be his dementia.  Also noted to have urinary retention, Foley catheter to be placed 07/04/20. Assessment & Plan   Acute metabolic encephalopathy/Acute delirium with underlying vascular dementia -Patient currently alert and  oriented x2 -Patient presented with fever and was placed empirically on IV vancomycin, cefepime and acyclovir -Culture data has remained negative to date -MRI brain showed no acute finding -EEG showed no signs of seizure.  Moderate diffuse encephalopathy nonspecific etiology. -Neurology consulted and appreciated, status post lumbar puncture -Discussed with neurology today, Dr. Lorrin Goodell, recommended starting Seroquel at bedtime.  Have placed patient on 25 mg of Seroquel. -Neurology also recommended follow-up as an outpatient given some laboratory studies on the lumbar puncture will take several weeks to result. -Wife tells me that patient was doing well up until he recently had a TIA and thereafter had a gout flare. -Will decrease morphine and add on tramadol to assist with daytime sleepiness and pain control.  Continue Seroquel nightly -Will consult psychiatry for further assistance with delirium  Fever of unknown origin -Chest x-ray shows no pneumonia -UA unremarkable for infection -Blood cultures show no growth to date -Patient was empirically started on acyclovir, vancomycin, and cefepime, which have now been discontinued -Question if this is due to underlying rheumatological issue -Patient does have a history of gout approximate 20 years ago -Currently afebrile, will continue to monitor  Polyarticular swelling and erythema -At this time no concern for infection given the imaging shows no effusions -Started on naproxen and PPI for gastric prophylaxis -PT and OT recommending home health  Essential hypertension -Allowing for permissive hypertension given question of TIA although work-up for TIA does seem unremarkable at this time  Hypokalemia -Resolved with replacement although only 3.6, will give additional dose of potassium today -Magnesium 1.9  Acute urinary retention -Patient has  had several in and out catheterizations he continues to have urinary retention -Possibly  secondary to acute illness versus underlying prostate issues -Foley catheter was placed on 07/04/2020 -Continue Flomax -Urology consulted and appreciated   Possible pneumonitis -Overnight, patient developed wheezing and other abnormal breath sounds as per family -CXR obtained and showed interstitial edema and/or pneumonitis -Discontinued IVF  -Will order dose of lasix and well as nebulizer treatments  Constipation -Will order Dulcolax  DVT Prophylaxis  SCDs  Code Status: Full  Family Communication: Family at bedside  Disposition Plan:  Status is: Inpatient  Remains inpatient appropriate because:Inpatient level of care appropriate due to severity of illness   Dispo: The patient is from: Home              Anticipated d/c is to: Home wit home health              Anticipated d/c date is: 2 days              Patient currently is not medically stable to d/c.  Consultants Neurology  Procedures  Lumbar puncture  Antibiotics   Anti-infectives (From admission, onward)   Start     Dose/Rate Route Frequency Ordered Stop   07/02/20 1400  acyclovir (ZOVIRAX) 800 mg in dextrose 5 % 150 mL IVPB  Status:  Discontinued        800 mg 166 mL/hr over 60 Minutes Intravenous Every 8 hours 07/02/20 0913 07/03/20 1841   07/01/20 1445  vancomycin (VANCOREADY) IVPB 750 mg/150 mL  Status:  Discontinued        750 mg 150 mL/hr over 60 Minutes Intravenous Every 12 hours 07/01/20 1354 07/03/20 1842   07/01/20 1445  acyclovir (ZOVIRAX) 800 mg in dextrose 5 % 150 mL IVPB  Status:  Discontinued        800 mg 166 mL/hr over 60 Minutes Intravenous Every 8 hours 07/01/20 1354 07/02/20 0825   07/01/20 1000  ceFEPIme (MAXIPIME) 2 g in sodium chloride 0.9 % 100 mL IVPB  Status:  Discontinued        2 g 200 mL/hr over 30 Minutes Intravenous Every 12 hours 06/30/20 2357 07/03/20 1842   06/30/20 2300  vancomycin (VANCOREADY) IVPB 750 mg/150 mL  Status:  Discontinued        750 mg 150 mL/hr over 60 Minutes  Intravenous Every 12 hours 06/30/20 1147 06/30/20 2357   06/30/20 2200  ceFEPIme (MAXIPIME) 2 g in sodium chloride 0.9 % 100 mL IVPB  Status:  Discontinued        2 g 200 mL/hr over 30 Minutes Intravenous Every 12 hours 06/30/20 1147 06/30/20 2357   06/30/20 1400  metroNIDAZOLE (FLAGYL) tablet 500 mg  Status:  Discontinued        500 mg Oral Every 8 hours 06/30/20 1137 06/30/20 2357   06/30/20 1145  ceFEPIme (MAXIPIME) 2 g in sodium chloride 0.9 % 100 mL IVPB        2 g 200 mL/hr over 30 Minutes Intravenous  Once 06/30/20 1137 06/30/20 1230   06/30/20 1145  vancomycin (VANCOCIN) IVPB 1000 mg/200 mL premix        1,000 mg 200 mL/hr over 60 Minutes Intravenous  Once 06/30/20 1137 06/30/20 1337      Subjective:   Kathryne Hitch seen and examined today.  Patient currently sleeping soundly this morning.  Per family, patient was extremely agitated overnight and they were able to get him to settle down at approximately 1 AM.  Objective:   Vitals:   07/04/20 2043 07/04/20 2044 07/04/20 2048 07/05/20 0551  BP:   140/67 (!) 143/70  Pulse:   (!) 54 (!) 57  Resp: 12 14 14 18   Temp:   97.7 F (36.5 C) 99 F (37.2 C)  TempSrc:   Oral Oral  SpO2:   98% 93%  Weight:      Height:        Intake/Output Summary (Last 24 hours) at 07/05/2020 0931 Last data filed at 07/05/2020 0552 Gross per 24 hour  Intake 1979.59 ml  Output 1800 ml  Net 179.59 ml   Filed Weights   07/01/20 0350 07/03/20 0538 07/04/20 0400  Weight: 82.4 kg 83.3 kg 83.4 kg   Exam  General: Well developed, chronically ill-appearing, NAD  HEENT: NCAT, mucous membranes moist.   Cardiovascular: S1 S2 auscultated, RRR.  Respiratory: Coarse breath sounds expiratory wheezing, occasional cough  Abdomen: Soft, nontender, nondistended, + bowel sounds  Extremities: warm dry without cyanosis clubbing or edema  Data Reviewed: I have personally reviewed following labs and imaging studies  CBC: Recent Labs  Lab 06/30/20 0831  07/01/20 0323 07/02/20 0417 07/03/20 0511 07/04/20 0238 07/05/20 0709  WBC 7.1 9.1 7.9 6.3 4.5 3.8*  NEUTROABS 5.6  --  5.7 4.6 2.5  --   HGB 10.9* 10.8* 10.5* 9.6* 9.9* 10.2*  HCT 31.7* 30.9* 31.3* 29.1* 29.3* 29.0*  MCV 99.7 98.7 100.3* 100.7* 98.3 97.6  PLT 181 172 176 209 257 [id number]   Basic Metabolic Panel: Recent Labs  Lab 06/30/20 2122 07/01/20 0323 07/02/20 0417 07/03/20 0511 07/04/20 0238 07/05/20 0709  NA  --  135 136 139 138 140  K  --  3.7 3.7 3.3* 3.3* 3.6  CL  --  103 101 106 105 105  CO2  --  22 23 21* 23 25  GLUCOSE  --  116* 123* 122* 118* 99  BUN  --  17 22 22 18 11   CREATININE  --  1.02 0.96 0.93 0.78 0.84  CALCIUM  --  8.4* 8.4* 8.2* 8.1* 8.3*  MG 1.6*  --   --   --  1.9  --   PHOS 3.0  --   --   --   --   --    GFR: Estimated Creatinine Clearance: 78.4 mL/min (by C-G formula based on SCr of 0.84 mg/dL). Liver Function Tests: Recent Labs  Lab 06/30/20 0831 07/01/20 0323  AST 24 24  ALT 16 15  ALKPHOS 29* 26*  BILITOT 1.5* 1.6*  PROT 6.7 5.8*  ALBUMIN 3.6 2.9*   No results for input(s): LIPASE, AMYLASE in the last 168 hours. Recent Labs  Lab 06/30/20 2122  AMMONIA 28   Coagulation Profile: Recent Labs  Lab 06/30/20 0831  INR 1.2   Cardiac Enzymes: Recent Labs  Lab 06/30/20 2122  CKTOTAL 398*   BNP (last 3 results) No results for input(s): PROBNP in the last 8760 hours. HbA1C: No results for input(s): HGBA1C in the last 72 hours. CBG: Recent Labs  Lab 06/30/20 1544  GLUCAP 114*   Lipid Profile: No results for input(s): CHOL, HDL, LDLCALC, TRIG, CHOLHDL, LDLDIRECT in the last 72 hours. Thyroid Function Tests: No results for input(s): TSH, T4TOTAL, FREET4, T3FREE, THYROIDAB in the last 72 hours. Anemia Panel: No results for input(s): VITAMINB12, FOLATE, FERRITIN, TIBC, IRON, RETICCTPCT in the last 72 hours. Urine analysis:    Component Value Date/Time   COLORURINE YELLOW 07/04/2020 [place] 07/04/2020  1219   LABSPEC 1.016 07/04/2020 1219   PHURINE 5.0 07/04/2020 1219   GLUCOSEU NEGATIVE 07/04/2020 1219   HGBUR MODERATE (A) 07/04/2020 [place] 07/04/2020 [place] 07/04/2020 1219   PROTEINUR NEGATIVE 07/04/2020 1219   NITRITE NEGATIVE 07/04/2020 1219   LEUKOCYTESUR NEGATIVE 07/04/2020 1219   Sepsis Labs: @LABRCNTIP (procalcitonin:4,lacticidven:4)  ) Recent Results (from the past 240 hour(s))  Blood Culture (routine x 2)     Status: None (Preliminary result)   Collection Time: 06/30/20  8:31 AM   Specimen: BLOOD LEFT ARM  Result Value Ref Range Status   Specimen Description   Final    BLOOD LEFT ARM Performed at [facility], [address] [place], [place] [address]    Special Requests   Final    BOTTLES DRAWN AEROBIC AND ANAEROBIC Blood Culture adequate volume Performed at [facility], [address] [place], [place] [address]    Culture   Final    NO GROWTH 4 DAYS Performed at [facility], [place] [address], [place], [place] [address]    Report Status PENDING  Incomplete  Urine culture     Status: None   Collection Time: 06/30/20  8:32 AM   Specimen: In/Out Cath Urine  Result Value Ref Range Status   Specimen Description   Final    IN/OUT CATH URINE Performed at [facility], [address] [place], [place] [address]    Special Requests   Final    NONE Performed at [facility], [address] [place], [place] [address]    Culture   Final    NO GROWTH Performed at [facility], [place] [address] [place], [place] [address]    Report Status 07/01/2020 FINAL  Final  Blood Culture (routine x 2)     Status: None (Preliminary result)   Collection Time: 06/30/20  9:00 AM   Specimen: BLOOD LEFT HAND  Result Value Ref Range Status   Specimen Description   Final    BLOOD LEFT HAND Performed at [facility], [address] [place], [place] [address]    Special  Requests   Final    BOTTLES DRAWN AEROBIC AND ANAEROBIC Blood Culture adequate volume Performed at [facility], [address] [place], [place] [address]    Culture   Final    NO GROWTH 4 DAYS Performed at [facility], [place] [address] [place], [place] [address]    Report Status PENDING  Incomplete  Resp Panel by RT-PCR (Flu A&B, Covid) Nasopharyngeal Swab     Status: None   Collection Time: 06/30/20 10:34 AM   Specimen: Nasopharyngeal Swab; Nasopharyngeal(NP) swabs in vial transport medium  Result Value Ref Range Status   SARS Coronavirus 2 by RT PCR NEGATIVE NEGATIVE Final    Comment: (NOTE) SARS-CoV-2 target nucleic acids are NOT DETECTED.  The SARS-CoV-2 RNA is generally detectable in upper respiratory specimens during the acute phase of infection. The lowest concentration of SARS-CoV-2 viral copies this assay can detect is 138 copies/mL. A negative result does not preclude SARS-Cov-2 infection and should not be used as the sole basis for treatment or other patient management decisions. A negative result may occur with  improper specimen collection/handling, submission of specimen other than nasopharyngeal swab, presence of viral mutation(s) within the areas targeted by this assay, and inadequate number of viral copies(<138 copies/mL).  A negative result must be combined with clinical observations, patient history, and epidemiological information. The expected result is Negative.  Fact Sheet for Patients:  EntrepreneurPulse.com.au  Fact Sheet for Healthcare Providers:  IncredibleEmployment.be  This test is no t yet approved or cleared by the [place] FDA and  has been authorized for detection and/or diagnosis of SARS-CoV-2 by FDA under an Emergency Use Authorization (EUA). This EUA will remain  in effect (meaning this test can be used) for the duration of the COVID-19 declaration under Section 564(b)(1) of the Act,  21 U.S.C.section 360bbb-3(b)(1), unless the authorization is terminated  or revoked sooner.       Influenza A by PCR NEGATIVE NEGATIVE Final   Influenza B by PCR NEGATIVE NEGATIVE Final    Comment: (NOTE) The Xpert Xpress SARS-CoV-2/FLU/RSV plus assay is intended as an aid in the diagnosis of influenza from Nasopharyngeal swab specimens and should not be used as a sole basis for treatment. Nasal washings and aspirates are unacceptable for Xpert Xpress SARS-CoV-2/FLU/RSV testing.  Fact Sheet for Patients: EntrepreneurPulse.com.au  Fact Sheet for Healthcare Providers: IncredibleEmployment.be  This test is not yet approved or cleared by the [place] FDA and has been authorized for detection and/or diagnosis of SARS-CoV-2 by FDA under an Emergency Use Authorization (EUA). This EUA will remain in effect (meaning this test can be used) for the duration of the COVID-19 declaration under Section 564(b)(1) of the Act, 21 U.S.C. section 360bbb-3(b)(1), unless the authorization is terminated or revoked.  Performed at [facility], [address] [place], [place] [address]   CSF culture     Status: None (Preliminary result)   Collection Time: 07/03/20  4:10 PM   Specimen: PATH Cytology CSF; Cerebrospinal Fluid  Result Value Ref Range Status   Specimen Description CSF  Final   Special Requests NONE  Final   Gram Stain   Final    WBC PRESENT, PREDOMINANTLY MONONUCLEAR NO ORGANISMS SEEN CYTOSPIN SMEAR    Culture   Final    NO GROWTH 2 DAYS Performed at [facility], [place] [address], [place], [place] [address]    Report Status PENDING  Incomplete      Radiology Studies: DG CHEST PORT 1 VIEW  Result Date: 07/05/2020 CLINICAL DATA:  Abnormal breath sounds. EXAM: PORTABLE CHEST 1 VIEW COMPARISON:  Chest x-ray 06/30/2020. FINDINGS: Prominence of the mediastinum most likely related to prominent great vessels and AP technique.  Thoracic aorta is tortuous. Cardiomegaly. Low lung volumes with bibasilar atelectasis. Mild bilateral interstitial prominence. Interstitial edema and/or pneumonitis could present in this fashion. No pleural effusion or pneumothorax. IMPRESSION: 1. Cardiomegaly. 2. Low lung volumes with bibasilar atelectasis. Mild bilateral interstitial prominence. Interstitial edema and/or pneumonitis could present in this fashion. No pleural effusion or pneumothorax. Electronically Signed   By: Marcello Moores  Register   On: 07/05/2020 06:15   DG FL GUIDED LUMBAR PUNCTURE  Result Date: 07/03/2020 CLINICAL DATA:  Mental status changes. EXAM: DIAGNOSTIC LUMBAR PUNCTURE UNDER FLUOROSCOPIC GUIDANCE FLUOROSCOPY TIME:  Fluoroscopy Time:  0 minutes and 18 seconds. Radiation Exposure Index (if provided by the fluoroscopic device): 8.8 mGy Number of Acquired Spot Images: 0 PROCEDURE: Informed consent was obtained from the patient prior to the procedure, including potential complications of bleeding, infection, headache, allergy, and pain. With the patient prone, the lower back was prepped with Betadine. 1% Lidocaine was used for local anesthesia. Lumbar puncture was performed at the L4-5 level using a 20 gauge needle with return of  clear CSF. 9.0 ml of CSF were obtained for laboratory studies. The patient tolerated the procedure well and there were no apparent complications. IMPRESSION: Fluoroscopic guided lumbar puncture with 9 cc of clear CSF obtained for appropriate laboratory evaluation. Electronically Signed   By: Rudie Meyer M.D.   On: 07/03/2020 17:01     Scheduled Meds: . aspirin EC  81 mg Oral Daily  . Chlorhexidine Gluconate Cloth  6 each Topical Daily  . cyanocobalamin  1,000 mcg Intramuscular Daily   Followed by  . [START ON 07/12/2020] cyanocobalamin  1,000 mcg Intramuscular Q Thu   Followed by  . [START ON 08/30/2020] cyanocobalamin  1,000 mcg Intramuscular Q28 days  . lidocaine  1 application Urethral Once  . naproxen   500 mg Oral BID WC  . pantoprazole (PROTONIX) IV  40 mg Intravenous Q12H  . potassium chloride  40 mEq Oral Once  . QUEtiapine  25 mg Oral QHS  . rosuvastatin  20 mg Oral QHS  . tamsulosin  0.4 mg Oral Daily  . [START ON 07/10/2020] thiamine injection  100 mg Intravenous Daily   Continuous Infusions: . sodium chloride 10 mL/hr at 07/03/20 0534  . thiamine injection 250 mg (07/05/20 0835)     LOS: 4 days   Time Spent in minutes   45 minutes  Sorren Vallier D.O. on 07/05/2020 at 9:31 AM  Between 7am to 7pm - Please see pager noted on [email or web address]  After 7pm go to [email or web address]  And look for the night coverage person covering for me after hours  [facility] Office  [phone]

## 2020-07-05 NOTE — Progress Notes (Addendum)
Pt. Requesting to have pts. Foley removed as pt. keeps reaching for it. Family fears he will pull it out. Pt. Also request to apply lidocaine to the area. On call for [facility] paged to make aware.

## 2020-07-05 NOTE — Plan of Care (Signed)
  Problem: Education: Goal: Knowledge of General Education information will improve Description Including pain rating scale, medication(s)/side effects and non-pharmacologic comfort measures Outcome: Progressing   

## 2020-07-05 NOTE — Progress Notes (Signed)
Occupational Therapy Treatment Patient Details Name: Patrick Macias MRN: [id number] DOB: 1941-01-28 Today's Date: 07/05/2020    History of present illness Pt adm with AMS, fever of unknown origin and rash/hives. Brain MRI negative for acute abnormality. ? gout.  PMH - dementia, HTN, ?TIA.   OT comments  Patient continues to making minimal progress towards goals in skilled OT session. Patient's session encompassed co-treat with PT due to previous need for increased assist, minimal agitation, and need for skilled hands to assess functional status and cognition. Pt remains lethargic due to medications provided yesterday for increased agitation and unable to arouse consistently despite multi-modal attempts and bringing EOB (total A). Pt placed in chair position at end of session in hopes for increased arousal. Discharge remains appropriate, therapy will continue to follow.    Follow Up Recommendations  Home health OT;Supervision/Assistance - 24 hour    Equipment Recommendations       Recommendations for Other Services      Precautions / Restrictions Precautions Precautions: Fall Precaution Comments: restraints removed while wife is present RN and NT aware Restrictions Weight Bearing Restrictions: No       Mobility Bed Mobility Overal bed mobility: Needs Assistance Bed Mobility: Supine to Sit;Sit to Supine     Supine to sit: +2 for physical assistance Sit to supine: +2 for physical assistance   General bed mobility comments: pt given medication around 1 AM and was asleep upon arrival, attempted sitting EOB to increase arousal  Transfers                 General transfer comment: Deferred due to level of arousal    Balance Overall balance assessment: Needs assistance Sitting-balance support: Feet supported;No upper extremity supported Sitting balance-Leahy Scale: Poor Sitting balance - Comments: Reliant on external assist from PT to keep trunk elevated                                    ADL either performed or assessed with clinical judgement   ADL Overall ADL's : Needs assistance/impaired     Grooming: Wash/dry hands;Wash/dry face;Total assistance Grooming Details (indicate cue type and reason): washed chin with washcloth with multimodal cues to initiate. unable to follow commands to wash entire face             Lower Body Dressing: Total assistance               Functional mobility during ADLs: Total assistance;+2 for physical assistance;+2 for safety/equipment General ADL Comments: pt with eyes closed and minimally responsive to multi-modal stimulation, of note pt was provided morphine and ativan at 3pm day prior     Vision       Perception     Praxis      Cognition Arousal/Alertness: Lethargic Behavior During Therapy: Flat affect Overall Cognitive Status: Impaired/Different from baseline Area of Impairment: Following commands;Orientation;Attention;Safety/judgement;Awareness;Problem solving                 Orientation Level: Disoriented to;Place;Time;Situation Current Attention Level: Focused;Sustained Memory: Decreased recall of precautions;Decreased short-term memory Following Commands: Follows one step commands inconsistently Safety/Judgement: Decreased awareness of safety;Decreased awareness of deficits Awareness: Intellectual Problem Solving: Difficulty sequencing;Decreased initiation;Requires tactile cues;Requires verbal cues General Comments: Pt not arousing to multiple stimuli attempted except pain inconsistently (only at nail bed, no reaction to sternal rub) suspect due to medications provided previous shift due to agitation  Exercises     Shoulder Instructions       General Comments pt asleep upon arrival and attempted bed moiblity and sitting to increase arousal, unable to arouse while sitting EOB requiring total assist to maintain sitting balance, OT attempted washing face,  drinking water and sternal rub; pt unable to arouse    Pertinent Vitals/ Pain       Pain Assessment: Faces Faces Pain Scale: Hurts a little bit Pain Location: grimacing with attempts of stimulation at nail bed Pain Intervention(s): Monitored during session  Home Living                                          Prior Functioning/Environment              Frequency  Min 2X/week        Progress Toward Goals  OT Goals(current goals can now be found in the care plan section)  Progress towards OT goals: Progressing toward goals  Acute Rehab OT Goals Patient Stated Goal: get up OT Goal Formulation: With patient/family Time For Goal Achievement: 07/17/20 Potential to Achieve Goals: Fair  Plan Discharge plan remains appropriate    Co-evaluation      Reason for Co-Treatment: Complexity of the patient's impairments (multi-system involvement);Necessary to address cognition/behavior during functional activity;For patient/therapist safety PT goals addressed during session: Mobility/safety with mobility;Balance;Strengthening/ROM        AM-PAC OT "6 Clicks" Daily Activity     Outcome Measure   Help from another person eating meals?: Total Help from another person taking care of personal grooming?: Total Help from another person toileting, which includes using toliet, bedpan, or urinal?: Total Help from another person bathing (including washing, rinsing, drying)?: Total Help from another person to put on and taking off regular upper body clothing?: Total Help from another person to put on and taking off regular lower body clothing?: Total 6 Click Score: 6    End of Session    OT Visit Diagnosis: Other abnormalities of gait and mobility (R26.89);Muscle weakness (generalized) (M62.81);Other symptoms and signs involving cognitive function;Pain   Activity Tolerance Patient limited by lethargy   Patient Left in bed;with call bell/phone within reach;with bed  alarm set;with family/visitor present (placed in chair position)   Nurse Communication Mobility status;Other (comment) (placed in chair position, lethargic)        Time: 8921-1941 OT Time Calculation (min): 18 min  Charges: OT General Charges $OT Visit: 1 Visit OT Treatments $Self Care/Home Management : 8-22 mins  Patrick Glen E. Nathen Balaban, COTA/L Acute Rehabilitation Services [phone]   Cherlyn Cushing 07/05/2020, 11:57 AM

## 2020-07-05 NOTE — Progress Notes (Signed)
MD, please change Zyprexa to 5mg  instead of 2.5 pt may benefit better from, thanks .

## 2020-07-05 NOTE — Progress Notes (Signed)
Physical Therapy Treatment Patient Details Name: Patrick Macias MRN: [id number] DOB: 1941/03/04 Today's Date: 07/05/2020    History of Present Illness Pt adm with AMS, fever of unknown origin and rash/hives. Brain MRI negative for acute abnormality. ? gout.  PMH - dementia, HTN, ?TIA.    PT Comments    PT and OT performed co treat due to pt's increased agitation for safety and to maximize functional mobility and strength. Pt asleep upon arrival, performed multiple attempts with bed moiblity, sitting and drinking to increase arousal. Pt unable to arouse and returned to bed with wife present, due to sedation pt total assist. Pt will benefit from skilled PT to address deficits in balance, strength, coordination, gait, endurance and safety to maximize independence with functional mobility prior to discharge.     Follow Up Recommendations  Home health PT;Supervision/Assistance - 24 hour     Equipment Recommendations  3in1 (PT);Wheelchair (measurements PT);Wheelchair cushion (measurements PT);Rolling walker with 5" wheels    Recommendations for Other Services       Precautions / Restrictions Precautions Precautions: Fall Precaution Comments: restraints removed while wife is present RN and NT aware Restrictions Weight Bearing Restrictions: No    Mobility  Bed Mobility Overal bed mobility: Needs Assistance Bed Mobility: Supine to Sit;Sit to Supine     Supine to sit: +2 for physical assistance Sit to supine: +2 for physical assistance   General bed mobility comments: pt given medication around 1 AM and was asleep upon arrival, attempted sitting EOB to increase arousal  Transfers                    Ambulation/Gait                 Stairs             Wheelchair Mobility    Modified Rankin (Stroke Patients Only)       Balance                                            Cognition Arousal/Alertness: Lethargic Behavior During  Therapy: Flat affect Overall Cognitive Status: Impaired/Different from baseline Area of Impairment: Following commands;Orientation;Attention;Safety/judgement;Awareness;Problem solving                 Orientation Level: Disoriented to;Place;Time;Situation Current Attention Level: Focused;Sustained Memory: Decreased recall of precautions;Decreased short-term memory Following Commands: Follows one step commands inconsistently Safety/Judgement: Decreased awareness of safety;Decreased awareness of deficits Awareness: Intellectual Problem Solving: Difficulty sequencing;Decreased initiation;Requires tactile cues;Requires verbal cues        Exercises      General Comments General comments (skin integrity, edema, etc.): pt asleep upon arrival and attempted bed moiblity and sitting to increase arousal, unable to arouse while sitting EOB requiring total assist to maintain sitting balance, OT attempted washing face, drinking water and sternal rub; pt unable to arouse      Pertinent Vitals/Pain Pain Assessment: Faces Faces Pain Scale: Hurts a little bit Pain Location: grimacing with attempts of stimulation at nail bed Pain Intervention(s): Monitored during session    Home Living                      Prior Function            PT Goals (current goals can now be found in the care plan section) Acute Rehab PT Goals Patient  Stated Goal: get up PT Goal Formulation: With patient/family Time For Goal Achievement: 07/15/20 Potential to Achieve Goals: Good Progress towards PT goals: Not progressing toward goals - comment (pt lethargic and unable to arouse)    Frequency    Min 3X/week      PT Plan Current plan remains appropriate    Co-evaluation PT/OT/SLP Co-Evaluation/Treatment: Yes Reason for Co-Treatment: Complexity of the patient's impairments (multi-system involvement);Necessary to address cognition/behavior during functional activity;For patient/therapist  safety PT goals addressed during session: Mobility/safety with mobility;Balance;Strengthening/ROM        AM-PAC PT "6 Clicks" Mobility   Outcome Measure  Help needed turning from your back to your side while in a flat bed without using bedrails?: Total Help needed moving from lying on your back to sitting on the side of a flat bed without using bedrails?: Total Help needed moving to and from a bed to a chair (including a wheelchair)?: Total Help needed standing up from a chair using your arms (e.g., wheelchair or bedside chair)?: Total Help needed to walk in hospital room?: Total Help needed climbing 3-5 steps with a railing? : Total 6 Click Score: 6    End of Session   Activity Tolerance: Patient limited by fatigue Patient left: in bed;with call bell/phone within reach;with bed alarm set;with family/visitor present Nurse Communication: Mobility status PT Visit Diagnosis: Unsteadiness on feet (R26.81);Other abnormalities of gait and mobility (R26.89);Muscle weakness (generalized) (M62.81)     Time: [id number] PT Time Calculation (min) (ACUTE ONLY): 18 min  Charges:                        Lyanne Co, DPT Acute Rehabilitation Services [id number]   Kendrick Ranch 07/05/2020, 11:05 AM

## 2020-07-05 NOTE — Plan of Care (Signed)
  Problem: Education: Goal: Knowledge of General Education information will improve Description: Including pain rating scale, medication(s)/side effects and non-pharmacologic comfort measures 07/05/2020 0430 by Carolanne Grumbling, RN Outcome: Progressing 07/05/2020 0430 by Carolanne Grumbling, RN Outcome: Progressing

## 2020-07-05 NOTE — Progress Notes (Addendum)
   07/05/20 1700  Assessment  Less Restrictive Interventions Attempted Yes  Health history reviewed prior to applying restraint Yes  Justification  Clinical Justification Pulling lines;Pulling tubes  Plan to Progress Out Family participation;Interdisciplinary collaboration;Continue safety plan  Education  Discontinuation Criteria Alternative intervention effective;Other (Comment) (family present. order dc tonight.off restrains)  Family Notification Family at bedside  Restraint Every 2 Hour Monitoring  Airway Clear with Spontaneous Respirations Yes  Circulation / Skin Integrity No signs of injury  Emotional / Mental Status Confused;Agitated/restless  Range of Motion Patient asleep (off order to be dc tonight.no rails up.)  Food and Fluids Fluid/Food offered;Patient declined  Elimination Urethral catheter  Patient's rights, dignity, safety maintained Yes  Can Restraints be Less Restrictive or Discontinued? Yes (Complete full restraint assessment) (off order to be dc tonight.no rails up.)  Non-violent Restraints  Soft Restraint Right Wrist  (off order to be dc tonight.no rails up.)  Soft Restraint Left Wrist  (off order to be dc tonight.no rails up.)  Soft Restraint Right Ankle  (off order to be dc tonight.no rails up.)  Soft Restraint Left Ankle  (off order to be dc tonight.no rails up.)  Side Rails Up x 4 (MUST have order)  (off order to be dc tonight.no rails up.)  Safety Interventions  Less Restrictive Interventions Active listening;Bed alarm;Chair alarm;Decrease stimulation (calming techniques);Diversional activities;Encourage family presence;Frequent verbal contacts;Re-evaluate equipment (appropriately applied)  Diversional Activities Other (comment)

## 2020-07-06 DIAGNOSIS — R6510 Systemic inflammatory response syndrome (SIRS) of non-infectious origin without acute organ dysfunction: Secondary | ICD-10-CM

## 2020-07-06 DIAGNOSIS — I10 Essential (primary) hypertension: Secondary | ICD-10-CM | POA: Diagnosis not present

## 2020-07-06 LAB — HSV(HERPES SMPLX VRS)ABS-I+II(IGG)-CSF: HSV Type I/II Ab, IgG CSF: 3.22 IV — ABNORMAL HIGH (ref <=0.89)

## 2020-07-06 LAB — CBC
HCT: 30.2 % — ABNORMAL LOW (ref 39.0–52.0)
Hemoglobin: 10.7 g/dL — ABNORMAL LOW (ref 13.0–17.0)
MCH: 34.1 pg — ABNORMAL HIGH (ref 26.0–34.0)
MCHC: 35.4 g/dL (ref 30.0–36.0)
MCV: 96.2 fL (ref 80.0–100.0)
Platelets: 327 10*3/uL (ref 150–400)
RBC: 3.14 MIL/uL — ABNORMAL LOW (ref 4.22–5.81)
RDW: 12.2 % (ref 11.5–15.5)
WBC: 4.5 10*3/uL (ref 4.0–10.5)
nRBC: 0 % (ref 0.0–0.2)

## 2020-07-06 LAB — BASIC METABOLIC PANEL
Anion gap: 10 (ref 5–15)
BUN: 10 mg/dL (ref 8–23)
CO2: 24 mmol/L (ref 22–32)
Calcium: 8.6 mg/dL — ABNORMAL LOW (ref 8.9–10.3)
Chloride: 103 mmol/L (ref 98–111)
Creatinine, Ser: 0.84 mg/dL (ref 0.61–1.24)
GFR, Estimated: >60 mL/min (ref >60)
Glucose, Bld: 98 mg/dL (ref 70–99)
Potassium: 3.5 mmol/L (ref 3.5–5.1)
Sodium: 137 mmol/L (ref 135–145)

## 2020-07-06 MED ORDER — POTASSIUM CHLORIDE 10 MEQ/100ML IV SOLN
10.0000 meq | INTRAVENOUS | Status: AC
  Administered 2020-07-06 (×4): 10 meq via INTRAVENOUS
  Filled 2020-07-06 (×4): qty 100

## 2020-07-06 MED ORDER — MORPHINE SULFATE (PF) 2 MG/ML IV SOLN
2.0000 mg | Freq: Two times a day (BID) | INTRAVENOUS | Status: DC | PRN
  Administered 2020-07-10 – 2020-07-11 (×2): 2 mg via INTRAVENOUS
  Filled 2020-07-06 (×2): qty 1

## 2020-07-06 MED ORDER — POTASSIUM CHLORIDE CRYS ER 20 MEQ PO TBCR: 40.0000 meq | EXTENDED_RELEASE_TABLET | Freq: Once | ORAL | Status: DC

## 2020-07-06 MED ORDER — SENNOSIDES-DOCUSATE SODIUM 8.6-50 MG PO TABS
1.0000 | ORAL_TABLET | Freq: Two times a day (BID) | ORAL | Status: DC
  Administered 2020-07-06 – 2020-07-12 (×12): 1 via ORAL
  Filled 2020-07-06 (×12): qty 1

## 2020-07-06 MED ORDER — POTASSIUM CHLORIDE 20 MEQ PO PACK
40.0000 meq | PACK | Freq: Once | ORAL | Status: DC
  Filled 2020-07-06: qty 2

## 2020-07-06 MED ORDER — OLANZAPINE 2.5 MG PO TABS
2.5000 mg | ORAL_TABLET | Freq: Every day | ORAL | Status: DC
  Administered 2020-07-06 – 2020-07-08 (×3): 2.5 mg via ORAL
  Filled 2020-07-06 (×4): qty 1

## 2020-07-06 NOTE — Progress Notes (Signed)
Physical Therapy Treatment Patient Details Name: Patrick Macias MRN: [id number] DOB: Jul 27, 1940 Today's Date: 07/06/2020    History of Present Illness Pt adm with AMS, fever of unknown origin and rash/hives. Brain MRI negative for acute abnormality. ? gout.  PMH - dementia, HTN, ?TIA.    PT Comments    Today's skilled session focused on mobility progression. Pt awake/alert and more participatory this session vs previous one. Pt was able to follow most simple commands, however remains confused and impulsive needing cues for safety through out session. Pt's spouse and son present throughout session and assisted as needed. Pt's son speaking to him from in front of pt getting pt to look up which did improve posture and balance at edge of bed, however when pt was asked if that was his son he stated "no" after already stating "oh, that guy is trouble" when asked who the son was. Unclear if this was the pt attempting to joke or confusion with really not knowing who his son was. Acute PT to continue during pt's hospital stay.     Follow Up Recommendations  Home health PT;Supervision/Assistance - 24 hour     Equipment Recommendations  3in1 (PT);Wheelchair (measurements PT);Wheelchair cushion (measurements PT);Rolling walker with 5" wheels    Precautions / Restrictions Precautions Precautions: Fall Precaution Comments: restraints reapplied at end of session Restrictions Weight Bearing Restrictions: No    Mobility  Bed Mobility Overal bed mobility: Needs Assistance Bed Mobility: Supine to Sit;Sit to Supine     Supine to sit: Mod assist;HOB elevated Sit to supine: Mod assist   General bed mobility comments: HOB 30 degrees: pt able to bring LE's off edge of bed with mod assist for trunk elevation into sitting. Once in sitting position pt was able to use arms to scoot to edge of bed with cues/min guard assist. At end of session with HOB flat: pt able to lie trunk down with assist to elevate LE's  onto bed surface.  Transfers Overall transfer level: Needs assistance Equipment used: 1 person hand held assist Transfers: Sit to/from Stand Sit to Stand: Min assist         General transfer comment: sit<>stand x 3 reps at edge of bed with min assist: with 1st stand- static standing while spouse adjusted pt's shorts around foley catheter. min guard to min assist for balance with cues for upright posture. on the 2cd and 3rd stands: lateral steps (2-3) each time toward [facility] with min assist, cues/facilitation for posture and step length. Pt able to lift left foot up with stepping, shuffled right foot along.  Ambulation/Gait             General Gait Details: deferred this session due to no tech or second person assistance for safety.       Balance Overall balance assessment: Needs assistance Sitting-balance support: Feet supported;No upper extremity supported Sitting balance-Leahy Scale: Fair Sitting balance - Comments: pt able to hold sefl upright with no UE support (hand together in his lap) for 5-6 minutes while spouse cleaned his back off. provided pt with wet wash cloth and pt able to wash face with hand over hand assist/cues. after each stand pt needed external support for balance due to right lean.              Cognition Arousal/Alertness: Awake/alert Behavior During Therapy: WFL for tasks assessed/performed;Flat affect Overall Cognitive Status: Impaired/Different from baseline  Orientation Level: Disoriented to;Time;Situation Current Attention Level: Sustained Memory: Decreased recall of precautions;Decreased short-term memory Following Commands: Follows one step commands consistently;Follows one step commands with increased time Safety/Judgement: Decreased awareness of safety;Decreased awareness of deficits Awareness: Intellectual Problem Solving: Difficulty sequencing;Decreased initiation;Requires tactile cues;Requires verbal cues General  Comments: pt awake and alert, trying to get out of bed so seen by PTA while awake. Pt able to follow most simple commands. Able to state he was "in a hospital due to not feeling well". Impulsive needing cues for safety throughout session. In bed in deep sleep after session.       Pertinent Vitals/Pain Pain Assessment: 0-10 Pain Score: 0-No pain     PT Goals (current goals can now be found in the care plan section) Acute Rehab PT Goals Patient Stated Goal: get up PT Goal Formulation: With patient/family Time For Goal Achievement: 07/15/20 Potential to Achieve Goals: Good Progress towards PT goals: Progressing toward goals    Frequency    Min 3X/week      PT Plan Current plan remains appropriate    AM-PAC PT "6 Clicks" Mobility   Outcome Measure  Help needed turning from your back to your side while in a flat bed without using bedrails?: A Lot Help needed moving from lying on your back to sitting on the side of a flat bed without using bedrails?: A Lot Help needed moving to and from a bed to a chair (including a wheelchair)?: A Lot Help needed standing up from a chair using your arms (e.g., wheelchair or bedside chair)?: A Lot Help needed to walk in hospital room?: Total Help needed climbing 3-5 steps with a railing? : Total 6 Click Score: 10    End of Session Equipment Utilized During Treatment: Gait belt Activity Tolerance: Patient tolerated treatment well;Patient limited by fatigue Patient left: in bed;with call bell/phone within reach;with bed alarm set;with family/visitor present;with restraints reapplied Nurse Communication: Mobility status PT Visit Diagnosis: Unsteadiness on feet (R26.81);Other abnormalities of gait and mobility (R26.89);Muscle weakness (generalized) (M62.81)     Time: 5329-9242 PT Time Calculation (min) (ACUTE ONLY): 25 min  Charges:  $Therapeutic Activity: 23-37 mins                    Willow Ora, PTA, [facility] Acute [employer] [phone] 07/06/20, 2:59 PM  Willow Ora 07/06/2020, 2:58 PM

## 2020-07-06 NOTE — Progress Notes (Signed)
PROGRESS NOTE    Patrick Macias  OEV:[id number] DOB: [birth year] DOA: 06/30/2020 PCP: Crist Infante, MD   Brief Narrative:  HPI on 06/30/2020 by Dr. Atha Starks Patrick Macias is a 80 y.o. male with hx of vascular dementia, BPH, dyslipidemia, hypertension, who presented to the facility on 06/30/2020 secondary to altered mental status, generalized weakness, fever.  At the time of exam, patient is cephalopathic, thus a complete and thorough HPI review systems unable to be fully obtained.  Information obtained from chart and daughter and wife bedside.  Apparently on Monday, patient developed hives on different parts of his body that on his body but was primarily his back, not in 1 dermatome.  He tried to take Zyrtec with no improvement of his rash.  He had been sleeping well due to the rash.  Then apparently yesterday he hit his hand on something but family does not know what.  Then the morning presentation, patient had a fever, and wife found him in bed significantly altered, encephalopathic, with inability to get out of bed.  He had abnormal speech, shaking, and was not a self and thus EMS was contacted to bring him.  Of note, patient was recently hospitalized in the beginning of December 2021 for encephalopathy.  MRI did not show a CVA but showed findings concerning with chronic microvascular ischemia.  Neurology was consulted and evaluated patient.  Per family there was discussion that he has vascular dementia.  He was scheduled to follow-up with neurology outpatient.  Psychogenic drugs he is on are Lexapro, and galantamine.  Interim history Admitted with AMS and fever. Neurology consulted. So far no infectious etiology found. S/p LP, unremarkable. Neurology feels this could be his dementia.  Also noted to have urinary retention, Foley catheter to be placed 07/04/20. Assessment & Plan   Acute metabolic encephalopathy/Acute delirium with underlying vascular dementia -Patient currently alert and  oriented x2 -Patient presented with fever and was placed empirically on IV vancomycin, cefepime and acyclovir -Culture data has remained negative to date -MRI brain showed no acute finding -EEG showed no signs of seizure.  Moderate diffuse encephalopathy nonspecific etiology. -Neurology consulted and appreciated, status post lumbar puncture -Discussed with neurology 07/04/20, Dr. Lorrin Goodell, recommended starting Seroquel at bedtime.  Have placed patient on 25 mg of Seroquel. -Neurology also recommended follow-up as an outpatient given some laboratory studies on the lumbar puncture will take several weeks to result. -Wife tells me that patient was doing well up until he recently had a TIA and thereafter had a gout flare. -Psychiatry consulted and appreciated. Added Zyprexa, 2.5mg  QHS. Discontinued ativan as this could lead to further delirium.  -Have decreased dose of morphine, tramadol added for pain -Continue B12 supplemenation.   Fever of unknown origin -Chest x-ray shows no pneumonia -UA unremarkable for infection -Blood cultures show no growth to date -Patient was empirically started on acyclovir, vancomycin, and cefepime, which have now been discontinued -Question if this is due to underlying rheumatological issue -Patient does have a history of gout approximate 20 years ago -Currently afebrile, will continue to monitor  Polyarticular swelling and erythema -At this time no concern for infection given the imaging shows no effusions -Started on naproxen and PPI for gastric prophylaxis -Added tramadol for pain -PT and OT recommending home health  Essential hypertension -Allowing for permissive hypertension given question of TIA although work-up for TIA does seem unremarkable at this time  Hypokalemia -Resolved with replacement although only 3.5, will give additional dose of potassium today -  Magnesium 1.9  Acute urinary retention -Patient has had several in and out catheterizations  he continues to have urinary retention -Possibly secondary to acute illness versus underlying prostate issues -Foley catheter was placed on 07/04/2020 -Continue Flomax -Urology consulted and appreciated and recommended continuing Foley catheter until acute issues have resolved.  May proceed with voiding trial as an inpatient however if unable to successfully pass, will follow up with outpatient urologist Dr. Terie Purser or patient may follow-up with [facility] for outpatient voiding trial.  Possible pneumonitis -Overnight 1/5-1/6, patient developed wheezing and other abnormal breath sounds as per family -CXR obtained and showed interstitial edema and/or pneumonitis -Discontinued IVF  -Patient was given dose of Lasix as well as nebulizer treatment -Improved aeration -Currently with no leukocytosis and afebrile  Constipation -Continue Dulcolax, will also add on Senokot -If needed may consider suppository or enema  DVT Prophylaxis  SCDs  Code Status: Full  Family Communication: Friend at bedside.  Family via phone   Disposition Plan:  Status is: Inpatient  Remains inpatient appropriate because:Inpatient level of care appropriate due to severity of illness   Dispo: The patient is from: Home              Anticipated d/c is to: Home wit home health              Anticipated d/c date is: 2 days              Patient currently is not medically stable to d/c.  Consultants Neurology Urology Psychiatry   Procedures  Lumbar puncture  Antibiotics   Anti-infectives (From admission, onward)   Start     Dose/Rate Route Frequency Ordered Stop   07/02/20 1400  acyclovir (ZOVIRAX) 800 mg in dextrose 5 % 150 mL IVPB  Status:  Discontinued        800 mg 166 mL/hr over 60 Minutes Intravenous Every 8 hours 07/02/20 0913 07/03/20 1841   07/01/20 1445  vancomycin (VANCOREADY) IVPB 750 mg/150 mL  Status:  Discontinued        750 mg 150 mL/hr over 60 Minutes Intravenous Every 12 hours 07/01/20  1354 07/03/20 1842   07/01/20 1445  acyclovir (ZOVIRAX) 800 mg in dextrose 5 % 150 mL IVPB  Status:  Discontinued        800 mg 166 mL/hr over 60 Minutes Intravenous Every 8 hours 07/01/20 1354 07/02/20 0825   07/01/20 1000  ceFEPIme (MAXIPIME) 2 g in sodium chloride 0.9 % 100 mL IVPB  Status:  Discontinued        2 g 200 mL/hr over 30 Minutes Intravenous Every 12 hours 06/30/20 2357 07/03/20 1842   06/30/20 2300  vancomycin (VANCOREADY) IVPB 750 mg/150 mL  Status:  Discontinued        750 mg 150 mL/hr over 60 Minutes Intravenous Every 12 hours 06/30/20 1147 06/30/20 2357   06/30/20 2200  ceFEPIme (MAXIPIME) 2 g in sodium chloride 0.9 % 100 mL IVPB  Status:  Discontinued        2 g 200 mL/hr over 30 Minutes Intravenous Every 12 hours 06/30/20 1147 06/30/20 2357   06/30/20 1400  metroNIDAZOLE (FLAGYL) tablet 500 mg  Status:  Discontinued        500 mg Oral Every 8 hours 06/30/20 1137 06/30/20 2357   06/30/20 1145  ceFEPIme (MAXIPIME) 2 g in sodium chloride 0.9 % 100 mL IVPB        2 g 200 mL/hr over 30 Minutes Intravenous  Once 06/30/20  1137 06/30/20 1230   06/30/20 1145  vancomycin (VANCOCIN) IVPB 1000 mg/200 mL premix        1,000 mg 200 mL/hr over 60 Minutes Intravenous  Once 06/30/20 1137 06/30/20 1337      Subjective:   Kathryne Hitch seen and examined today.  Patient sleeping soundly this morning.  Can tell me his name.  Per friend at bedside, patient did have a restless evening.   Objective:   Vitals:   07/05/20 2100 07/06/20 0100 07/06/20 0556 07/06/20 0600  BP:  (!) 123/92 (!) 168/74   Pulse: 78 71 (!) 56   Resp:  18 14   Temp:  97.9 F (36.6 C) 98 F (36.7 C)   TempSrc:  Oral Oral   SpO2:  97% 100%   Weight:    81.3 kg  Height:        Intake/Output Summary (Last 24 hours) at 07/06/2020 0953 Last data filed at 07/06/2020 0500 Gross per 24 hour  Intake 660 ml  Output 2500 ml  Net -1840 ml   Filed Weights   07/03/20 0538 07/04/20 0400 07/06/20 0600  Weight: 83.3  kg 83.4 kg 81.3 kg   Exam  General: Well developed, chronically ill-appearing, NAD  HEENT: NCAT, mucous membranes moist.   Cardiovascular: S1 S2 auscultated, RRR.  Respiratory: Diminished however clear  Abdomen: Soft, nontender, nondistended, + bowel sounds  Extremities: warm dry without cyanosis clubbing or edema  Neuro: AAOx 1 (self), nonfocal   Data Reviewed: I have personally reviewed following labs and imaging studies  CBC: Recent Labs  Lab 06/30/20 0831 07/01/20 0323 07/02/20 0417 07/03/20 0511 07/04/20 0238 07/05/20 0709 07/06/20 0228  WBC 7.1   < > 7.9 6.3 4.5 3.8* 4.5  NEUTROABS 5.6  --  5.7 4.6 2.5  --   --   HGB 10.9*   < > 10.5* 9.6* 9.9* 10.2* 10.7*  HCT 31.7*   < > 31.3* 29.1* 29.3* 29.0* 30.2*  MCV 99.7   < > 100.3* 100.7* 98.3 97.6 96.2  PLT 181   < > 176 209 257 294 327   < > = values in this interval not displayed.   Basic Metabolic Panel: Recent Labs  Lab 06/30/20 2122 07/01/20 0323 07/02/20 0417 07/03/20 0511 07/04/20 0238 07/05/20 0709 07/06/20 0228  NA  --    < > 136 139 138 140 137  K  --    < > 3.7 3.3* 3.3* 3.6 3.5  CL  --    < > 101 106 105 105 103  CO2  --    < > 23 21* 23 25 24   GLUCOSE  --    < > 123* 122* 118* 99 98  BUN  --    < > 22 22 18 11 10   CREATININE  --    < > 0.96 0.93 0.78 0.84 0.84  CALCIUM  --    < > 8.4* 8.2* 8.1* 8.3* 8.6*  MG 1.6*  --   --   --  1.9  --   --   PHOS 3.0  --   --   --   --   --   --    < > = values in this interval not displayed.   GFR: Estimated Creatinine Clearance: 78.4 mL/min (by C-G formula based on SCr of 0.84 mg/dL). Liver Function Tests: Recent Labs  Lab 06/30/20 0831 07/01/20 0323  AST 24 24  ALT 16 15  ALKPHOS 29* 26*  BILITOT 1.5*  1.6*  PROT 6.7 5.8*  ALBUMIN 3.6 2.9*   No results for input(s): LIPASE, AMYLASE in the last 168 hours. Recent Labs  Lab 06/30/20 2122  AMMONIA 28   Coagulation Profile: Recent Labs  Lab 06/30/20 0831  INR 1.2   Cardiac  Enzymes: Recent Labs  Lab 06/30/20 2122  CKTOTAL 398*   BNP (last 3 results) No results for input(s): PROBNP in the last 8760 hours. HbA1C: No results for input(s): HGBA1C in the last 72 hours. CBG: Recent Labs  Lab 06/30/20 1544  GLUCAP 114*   Lipid Profile: No results for input(s): CHOL, HDL, LDLCALC, TRIG, CHOLHDL, LDLDIRECT in the last 72 hours. Thyroid Function Tests: No results for input(s): TSH, T4TOTAL, FREET4, T3FREE, THYROIDAB in the last 72 hours. Anemia Panel: No results for input(s): VITAMINB12, FOLATE, FERRITIN, TIBC, IRON, RETICCTPCT in the last 72 hours. Urine analysis:    Component Value Date/Time   COLORURINE YELLOW 07/04/2020 [place] 07/04/2020 1219   LABSPEC 1.016 07/04/2020 1219   PHURINE 5.0 07/04/2020 1219   GLUCOSEU NEGATIVE 07/04/2020 1219   HGBUR MODERATE (A) 07/04/2020 1219   BILIRUBINUR NEGATIVE 07/04/2020 1219   KETONESUR NEGATIVE 07/04/2020 1219   PROTEINUR NEGATIVE 07/04/2020 1219   NITRITE NEGATIVE 07/04/2020 1219   LEUKOCYTESUR NEGATIVE 07/04/2020 1219   Sepsis Labs: @LABRCNTIP (procalcitonin:4,lacticidven:4)  ) Recent Results (from the past 240 hour(s))  Blood Culture (routine x 2)     Status: None   Collection Time: 06/30/20  8:31 AM   Specimen: BLOOD LEFT ARM  Result Value Ref Range Status   Specimen Description   Final    BLOOD LEFT ARM Performed at [facility], [address] [place], [place] [address]    Special Requests   Final    BOTTLES DRAWN AEROBIC AND ANAEROBIC Blood Culture adequate volume Performed at [facility], [address] [place], [place] [address]    Culture   Final    NO GROWTH 5 DAYS Performed at [facility], [place] [address] [place], [place] [address]    Report Status 07/05/2020 FINAL  Final  Urine culture     Status: None   Collection Time: 06/30/20  8:32 AM   Specimen: In/Out Cath Urine  Result Value Ref Range Status   Specimen Description   Final     IN/OUT CATH URINE Performed at [facility], [address] [place], [place] [address]    Special Requests   Final    NONE Performed at [facility], [address] [place], [place] [address]    Culture   Final    NO GROWTH Performed at [facility], [place] [address] [place], [place] [address]    Report Status 07/01/2020 FINAL  Final  Blood Culture (routine x 2)     Status: None   Collection Time: 06/30/20  9:00 AM   Specimen: BLOOD LEFT HAND  Result Value Ref Range Status   Specimen Description   Final    BLOOD LEFT HAND Performed at [facility], [address] [place], [place] [address]    Special Requests   Final    BOTTLES DRAWN AEROBIC AND ANAEROBIC Blood Culture adequate volume Performed at [facility], [address] [place], [place] [address]    Culture   Final    NO GROWTH 5 DAYS Performed at [facility], [place] [address],  [place], [place] [address]    Report Status 07/05/2020 FINAL  Final  Resp Panel by RT-PCR (Flu A&B, Covid) Nasopharyngeal Swab     Status: None   Collection Time: 06/30/20 10:34 AM   Specimen: Nasopharyngeal Swab; Nasopharyngeal(NP) swabs in vial transport medium  Result Value Ref Range Status   SARS Coronavirus 2 by RT PCR NEGATIVE NEGATIVE Final    Comment: (NOTE) SARS-CoV-2 target nucleic acids are NOT DETECTED.  The SARS-CoV-2 RNA is generally detectable in upper respiratory specimens during the acute phase of infection. The lowest concentration of SARS-CoV-2 viral copies this assay can detect is 138 copies/mL. A negative result does not preclude SARS-Cov-2 infection and should not be used as the sole basis for treatment or other patient management decisions. A negative result may occur with  improper specimen collection/handling, submission of specimen other than nasopharyngeal swab, presence of viral mutation(s) within the areas targeted by this assay, and inadequate number  of viral copies(<138 copies/mL). A negative result must be combined with clinical observations, patient history, and epidemiological information. The expected result is Negative.  Fact Sheet for Patients:  BloggerCourse.com  Fact Sheet for Healthcare Providers:  SeriousBroker.it  This test is no t yet approved or cleared by the [place] FDA and  has been authorized for detection and/or diagnosis of SARS-CoV-2 by FDA under an Emergency Use Authorization (EUA). This EUA will remain  in effect (meaning this test can be used) for the duration of the COVID-19 declaration under Section 564(b)(1) of the Act, 21 U.S.C.section 360bbb-3(b)(1), unless the authorization is terminated  or revoked sooner.       Influenza A by PCR NEGATIVE NEGATIVE Final   Influenza B by PCR NEGATIVE NEGATIVE Final    Comment: (NOTE) The Xpert Xpress SARS-CoV-2/FLU/RSV plus assay is intended as an aid in the diagnosis of influenza from Nasopharyngeal swab specimens and should not be used as a sole basis for treatment. Nasal washings and aspirates are unacceptable for Xpert Xpress SARS-CoV-2/FLU/RSV testing.  Fact Sheet for Patients: BloggerCourse.com  Fact Sheet for Healthcare Providers: SeriousBroker.it  This test is not yet approved or cleared by the [place] FDA and has been authorized for detection and/or diagnosis of SARS-CoV-2 by FDA under an Emergency Use Authorization (EUA). This EUA will remain in effect (meaning this test can be used) for the duration of the COVID-19 declaration under Section 564(b)(1) of the Act, 21 U.S.C. section 360bbb-3(b)(1), unless the authorization is terminated or revoked.  Performed at [facility], [address] [place], [place] [address]   CSF culture     Status: None (Preliminary result)   Collection Time: 07/03/20  4:10 PM   Specimen: PATH  Cytology CSF; Cerebrospinal Fluid  Result Value Ref Range Status   Specimen Description CSF  Final   Special Requests NONE  Final   Gram Stain   Final    WBC PRESENT, PREDOMINANTLY MONONUCLEAR NO ORGANISMS SEEN CYTOSPIN SMEAR    Culture   Final    NO GROWTH 3 DAYS Performed at [facility], [address], [place], [place] [address]    Report Status PENDING  Incomplete      Radiology Studies: DG CHEST PORT 1 VIEW  Result Date: 07/05/2020 CLINICAL DATA:  Abnormal breath sounds. EXAM: PORTABLE CHEST 1 VIEW COMPARISON:  Chest x-ray 06/30/2020. FINDINGS: Prominence of the mediastinum most likely related to prominent great vessels and AP technique. Thoracic aorta is tortuous. Cardiomegaly. Low lung volumes with bibasilar atelectasis. Mild bilateral  interstitial prominence. Interstitial edema and/or pneumonitis could present in this fashion. No pleural effusion or pneumothorax. IMPRESSION: 1. Cardiomegaly. 2. Low lung volumes with bibasilar atelectasis. Mild bilateral interstitial prominence. Interstitial edema and/or pneumonitis could present in this fashion. No pleural effusion or pneumothorax. Electronically Signed   By: Marcello Moores  Register   On: 07/05/2020 06:15     Scheduled Meds: . aspirin EC  81 mg Oral Daily  . Chlorhexidine Gluconate Cloth  6 each Topical Daily  . cyanocobalamin  1,000 mcg Intramuscular Daily   Followed by  . [START ON 07/12/2020] cyanocobalamin  1,000 mcg Intramuscular Q Thu   Followed by  . [START ON 08/30/2020] cyanocobalamin  1,000 mcg Intramuscular Q28 days  . lidocaine  1 application Urethral Once  . naproxen  500 mg Oral BID WC  . OLANZapine zydis  2.5 mg Oral QHS  . pantoprazole (PROTONIX) IV  40 mg Intravenous Q12H  . potassium chloride  40 mEq Oral Once  . rosuvastatin  20 mg Oral QHS  . tamsulosin  0.4 mg Oral Daily  . [START ON 07/10/2020] thiamine injection  100 mg Intravenous Daily   Continuous Infusions: . sodium chloride 10 mL/hr at 07/03/20  0534  . thiamine injection 250 mg (07/06/20 0927)     LOS: 5 days   Time Spent in minutes   30 minutes  Alric Geise D.O. on 07/06/2020 at 9:53 AM  Between 7am to 7pm - Please see pager noted on [email or web address]  After 7pm go to [email or web address]  And look for the night coverage person covering for me after hours  [facility] Office  [phone]

## 2020-07-06 NOTE — Progress Notes (Signed)
MD, pt appears to have sleep apnea, pt may benefit from having a sleep study in the near future, thanks Buckner [place].

## 2020-07-06 NOTE — Consult Note (Signed)
Writer followed up from previous psychiatric evaluation in which medication adjustment were made. As with previous evaluation patient appeared to be in a deep sleep, and his wife [name] ) and son Erlene Quan) were both at the bedside. They preferred that he not be awaken, as he was just getting into a good sleep. His wife did express a significant amount of concerns regarding yesterdays treatment plan. She reports states" he was very restless, combative and the nurse had to spend 2 hours in here last night to get him settled. He ended up giving him Haldol at 7pm because he was so restless. We wanted to know why the Ativan was stopped? Why the Zyprexa was not started at 5mg ? And why cant he get Haldol? " Writer explained to wife at the length the discussion that took place with the daughter yesterday and all the reasons for making the appropriate decisions. As noted I explained patient is naive to antipsychotics and ZYprexa is a very potent medication in its class. In terms of Haldol it is recommended in persons for treatment of delirium, however is contraindicated for persons with heart disease as it can worsen qtc prolongation. Writer took this example and identified to both son and wife, patient current heart rate of 48 on the monitor. Therefore in addition to his heart disease and bradycardia he will not benefit from initiating a medication that could potentially worsening his qtc. Son then inquired about  Morphine and its use for restlessness and sedation. He was advised that use of Morphine, Haldol, and Ativan have been used for persons who are severly violent, psychotic or comfort care/EOL. Emphasis was placed on the need to avoid those medications, however Morphine is not a medication a prescribe and that if he was in severe pain I would think he could have the morphine. During this extensive conversation with the family, patient was noted to be a sleep with RR of 0, and audible snoring. Also discussed short  term goals to include inpatient geriatric psych facility if they are not comfortable managing him at home in this acute state of delirium which usually should resolve in the next few days. Wife remains concerns that patient has had some decline since his admission " he hasn't been able to get out of bed since he got here. PT cant work with him because he is too sleepy or out of it. " Discussed my concerns around polypharmacy and working hands on with the patient from a nursing stand point and having family at bedside to help manage delirium. Patient behaviors remain consistent with delirium, and it may require round the clock supervision and that family members and friends are the best ones who can provide this care. Wife asked" do you have any influences on getting him a sitter at night. That poor nurse was in here 2 hours last night. " Writer advised providers do not have any influence on staffing and prioritization of these needs. Finally they before leaving they asked " are you able to prescribe one more dose of Haldol"? Writer then used the teach back method, to have them demonstrate why I was no longer willing to prescribe Haldol. Son was unable to answer, wife stated originally stated the zyprexa and then she added his heart and safety concerns. Writer addressed all concerns, comments and questions.   Reviewed the goal is to get patient to respond appropriately to Zyprexa in order to return home and follow up with neurology. However if this is not observed in  the next few days or by the time he is medically cleared, we need to consider inpatient geriatric psych facility.   -Will add Zyprexa 2.5mg  po qam, to help manage agitation and restlessness during the day. Family appeared very concern and despite numerous reasons given they wanted him to have additional medications added.  -Will continue Zyprexa zydis 2.5mg  po qhs.  -Will encourage nursing staff to document patient behaviors throughout his  admission here, to include all reasons for administering prn medications.Patient remains too sedated and in a somber to complete a psychiatric evaluation.  -Encourage increased mobility to help prevent deconditioning and worsening delirium.  - Repeat EKG obtained today, Qtc 470 which has increased moderately from his baseline, will strongly advise against use of any additional mediations that could prolong his qtc, Patient with multiple risk factors for QTc prolongation to include cardiac abnormalities, male, age >65, use of multiple Psychotropics that may prolong qtc, and electrolyte abnormalities.  - Unable to complete psychiatric evaluation as patient was asleep. Will continue to follow this patient on psychiatric consult service, due to high demands from family and nursing staff. -Please see original note written on 01/06 for additional delirium precautions and medications.

## 2020-07-06 NOTE — Care Management Important Message (Signed)
Important Message  Patient Details  Name: Patrick Macias MRN: [id number] Date of Birth: Nov 18, 1940   Medicare Important Message Given:  Yes     Shelda Altes 07/06/2020, 10:50 AM

## 2020-07-07 DIAGNOSIS — I10 Essential (primary) hypertension: Secondary | ICD-10-CM | POA: Diagnosis not present

## 2020-07-07 DIAGNOSIS — R6510 Systemic inflammatory response syndrome (SIRS) of non-infectious origin without acute organ dysfunction: Secondary | ICD-10-CM | POA: Diagnosis not present

## 2020-07-07 LAB — BASIC METABOLIC PANEL
Anion gap: 11 (ref 5–15)
BUN: 12 mg/dL (ref 8–23)
CO2: 25 mmol/L (ref 22–32)
Calcium: 8.7 mg/dL — ABNORMAL LOW (ref 8.9–10.3)
Chloride: 103 mmol/L (ref 98–111)
Creatinine, Ser: 0.93 mg/dL (ref 0.61–1.24)
GFR, Estimated: >60 mL/min (ref >60)
Glucose, Bld: 90 mg/dL (ref 70–99)
Potassium: 3.9 mmol/L (ref 3.5–5.1)
Sodium: 139 mmol/L (ref 135–145)

## 2020-07-07 LAB — CSF CULTURE W GRAM STAIN: Culture: NO GROWTH

## 2020-07-07 MED ORDER — PNEUMOCOCCAL VAC POLYVALENT 25 MCG/0.5ML IJ INJ
0.5000 mL | INJECTION | INTRAMUSCULAR | Status: DC
  Filled 2020-07-07 (×2): qty 0.5

## 2020-07-07 MED ORDER — POLYETHYLENE GLYCOL 3350 17 G PO PACK
17.0000 g | PACK | Freq: Two times a day (BID) | ORAL | Status: DC
  Administered 2020-07-07 – 2020-07-12 (×10): 17 g via ORAL
  Filled 2020-07-07 (×10): qty 1

## 2020-07-07 NOTE — Progress Notes (Signed)
Patient on trial for restraint removal.  Wrist restraints removed.  Oriented to self and does recognize wife and is able to tell me how many years they have been married.  Is not oriented to time, place or situation.  Does follow commands.  Wife states has a history of vascular dementia, but had been functional at work and had been driving.

## 2020-07-07 NOTE — Progress Notes (Signed)
PROGRESS NOTE    Patrick Macias  ZHY:[id number] DOB: [birth year] DOA: 06/30/2020 PCP: Crist Infante, MD   Brief Narrative:  HPI on 06/30/2020 by Dr. Atha Starks Patrick Macias is a 80 y.o. male with hx of vascular dementia, BPH, dyslipidemia, hypertension, who presented to the facility on 06/30/2020 secondary to altered mental status, generalized weakness, fever.  At the time of exam, patient is cephalopathic, thus a complete and thorough HPI review systems unable to be fully obtained.  Information obtained from chart and daughter and wife bedside.  Apparently on Monday, patient developed hives on different parts of his body that on his body but was primarily his back, not in 1 dermatome.  He tried to take Zyrtec with no improvement of his rash.  He had been sleeping well due to the rash.  Then apparently yesterday he hit his hand on something but family does not know what.  Then the morning presentation, patient had a fever, and wife found him in bed significantly altered, encephalopathic, with inability to get out of bed.  He had abnormal speech, shaking, and was not a self and thus EMS was contacted to bring him.  Of note, patient was recently hospitalized in the beginning of December 2021 for encephalopathy.  MRI did not show a CVA but showed findings concerning with chronic microvascular ischemia.  Neurology was consulted and evaluated patient.  Per family there was discussion that he has vascular dementia.  He was scheduled to follow-up with neurology outpatient.  Psychogenic drugs he is on are Lexapro, and galantamine.  Interim history Admitted with AMS and fever. Neurology consulted. So far no infectious etiology found. S/p LP, unremarkable. Neurology feels this could be his dementia.  Also noted to have urinary retention, Foley catheter to be placed 07/04/20. Assessment & Plan   Acute metabolic encephalopathy/Acute delirium with underlying vascular dementia -Patient currently alert and  oriented x2 -Patient presented with fever and was placed empirically on IV vancomycin, cefepime and acyclovir -Culture data has remained negative to date -MRI brain showed no acute finding -EEG showed no signs of seizure.  Moderate diffuse encephalopathy nonspecific etiology. -Neurology consulted and appreciated, status post lumbar puncture -Discussed with neurology 07/04/20, Dr. Lorrin Goodell, recommended starting Seroquel at bedtime.  Have placed patient on 25 mg of Seroquel. -Neurology also recommended follow-up as an outpatient given some laboratory studies on the lumbar puncture will take several weeks to result. -Wife tells me that patient was doing well up until he recently had a TIA and thereafter had a gout flare. -Psychiatry consulted and appreciated.  Ativan discontinued, Zyprexa added 2.5 mg daily as well as 2.5 mg nightly. -Minimizing the use of IV morphine.  Tramadol has been added for pain management -Continue B12 supplemenation.  -Patient more alert and oriented this morning  Fever of unknown origin -Chest x-ray shows no pneumonia -UA unremarkable for infection -Blood cultures show no growth to date -Patient was empirically started on acyclovir, vancomycin, and cefepime, which have now been discontinued -Question if this is due to underlying rheumatological issue -Patient does have a history of gout approximate 20 years ago -Currently afebrile, will continue to monitor  Polyarticular swelling and erythema -At this time no concern for infection given the imaging shows no effusions -Started on naproxen and PPI for gastric prophylaxis -Added tramadol for pain -PT and OT recommending home health  Essential hypertension -Allowing for permissive hypertension given question of TIA although work-up for TIA does seem unremarkable at this time  Hypokalemia -Resolved  with replacement, currently 3.9 -Magnesium 1.9 -Continue to monitor and replace as needed  Acute urinary  retention -Patient has had several in and out catheterizations he continues to have urinary retention -Possibly secondary to acute illness versus underlying prostate issues -Foley catheter was placed on 07/04/2020 -Continue Flomax -Urology consulted and appreciated and recommended continuing Foley catheter until acute issues have resolved.  May proceed with voiding trial as an inpatient however if unable to successfully pass, will follow up with outpatient urologist Dr. Terie Purser or patient may follow-up with [facility] for outpatient voiding trial.  Possible pneumonitis -Overnight 1/5-1/6, patient developed wheezing and other abnormal breath sounds as per family -CXR obtained and showed interstitial edema and/or pneumonitis -Discontinued IVF  -Patient was given dose of Lasix as well as nebulizer treatment -Improved aeration -Currently with no leukocytosis and afebrile  Constipation -Continue Dulcolax, will also add on Senokot -If needed may consider suppository or enema  DVT Prophylaxis  SCDs  Code Status: Full  Family Communication: Wife at bedside.  Disposition Plan:  Status is: Inpatient  Remains inpatient appropriate because:Inpatient level of care appropriate due to severity of illness   Dispo: The patient is from: Home              Anticipated d/c is to: Home wit home health              Anticipated d/c date is: 2 days              Patient currently is not medically stable to d/c.  Consultants Neurology Urology Psychiatry   Procedures  Lumbar puncture  Antibiotics   Anti-infectives (From admission, onward)   Start     Dose/Rate Route Frequency Ordered Stop   07/02/20 1400  acyclovir (ZOVIRAX) 800 mg in dextrose 5 % 150 mL IVPB  Status:  Discontinued        800 mg 166 mL/hr over 60 Minutes Intravenous Every 8 hours 07/02/20 0913 07/03/20 1841   07/01/20 1445  vancomycin (VANCOREADY) IVPB 750 mg/150 mL  Status:  Discontinued        750 mg 150 mL/hr over 60  Minutes Intravenous Every 12 hours 07/01/20 1354 07/03/20 1842   07/01/20 1445  acyclovir (ZOVIRAX) 800 mg in dextrose 5 % 150 mL IVPB  Status:  Discontinued        800 mg 166 mL/hr over 60 Minutes Intravenous Every 8 hours 07/01/20 1354 07/02/20 0825   07/01/20 1000  ceFEPIme (MAXIPIME) 2 g in sodium chloride 0.9 % 100 mL IVPB  Status:  Discontinued        2 g 200 mL/hr over 30 Minutes Intravenous Every 12 hours 06/30/20 2357 07/03/20 1842   06/30/20 2300  vancomycin (VANCOREADY) IVPB 750 mg/150 mL  Status:  Discontinued        750 mg 150 mL/hr over 60 Minutes Intravenous Every 12 hours 06/30/20 1147 06/30/20 2357   06/30/20 2200  ceFEPIme (MAXIPIME) 2 g in sodium chloride 0.9 % 100 mL IVPB  Status:  Discontinued        2 g 200 mL/hr over 30 Minutes Intravenous Every 12 hours 06/30/20 1147 06/30/20 2357   06/30/20 1400  metroNIDAZOLE (FLAGYL) tablet 500 mg  Status:  Discontinued        500 mg Oral Every 8 hours 06/30/20 1137 06/30/20 2357   06/30/20 1145  ceFEPIme (MAXIPIME) 2 g in sodium chloride 0.9 % 100 mL IVPB        2 g 200 mL/hr over  30 Minutes Intravenous  Once 06/30/20 1137 06/30/20 1230   06/30/20 1145  vancomycin (VANCOCIN) IVPB 1000 mg/200 mL premix        1,000 mg 200 mL/hr over 60 Minutes Intravenous  Once 06/30/20 1137 06/30/20 1337      Subjective:   Patrick Macias seen and examined today.  Patient alert this morning and can tell me that his wife is in the room with him.  He can tell me his name.    Objective:   Vitals:   07/06/20 0100 07/06/20 0556 07/06/20 0600 07/06/20 2100  BP: (!) 123/92 (!) 168/74  122/76  Pulse: 71 (!) 56  (!) 56  Resp: 18 14  13   Temp: 97.9 F (36.6 C) 98 F (36.7 C)    TempSrc: Oral Oral    SpO2: 97% 100%    Weight:   81.3 kg   Height:        Intake/Output Summary (Last 24 hours) at 07/07/2020 1318 Last data filed at 07/07/2020 0650 Gross per 24 hour  Intake 120 ml  Output 1425 ml  Net -1305 ml   Filed Weights   07/03/20 0538  07/04/20 0400 07/06/20 0600  Weight: 83.3 kg 83.4 kg 81.3 kg   Exam  General: Well developed, chronically ill-appearing, NAD  HEENT: NCAT, mucous membranes moist.   Cardiovascular: S1 S2 auscultated, RRR.  Respiratory: Diminished however clear  Abdomen: Soft, nontender, nondistended, + bowel sounds  Extremities: warm dry without cyanosis clubbing or edema  Neuro: AAOx 1 (self), nonfocal  Psych: appropriate mood and affect   Data Reviewed: I have personally reviewed following labs and imaging studies  CBC: Recent Labs  Lab 07/02/20 0417 07/03/20 0511 07/04/20 0238 07/05/20 0709 07/06/20 0228  WBC 7.9 6.3 4.5 3.8* 4.5  NEUTROABS 5.7 4.6 2.5  --   --   HGB 10.5* 9.6* 9.9* 10.2* 10.7*  HCT 31.3* 29.1* 29.3* 29.0* 30.2*  MCV 100.3* 100.7* 98.3 97.6 96.2  PLT 176 209 257 294 [id number]   Basic Metabolic Panel: Recent Labs  Lab 06/30/20 2122 07/01/20 0323 07/03/20 0511 07/04/20 0238 07/05/20 0709 07/06/20 0228 07/07/20 0342  NA  --    < > 139 138 140 137 139  K  --    < > 3.3* 3.3* 3.6 3.5 3.9  CL  --    < > 106 105 105 103 103  CO2  --    < > 21* 23 25 24 25   GLUCOSE  --    < > 122* 118* 99 98 90  BUN  --    < > 22 18 11 10 12   CREATININE  --    < > 0.93 0.78 0.84 0.84 0.93  CALCIUM  --    < > 8.2* 8.1* 8.3* 8.6* 8.7*  MG 1.6*  --   --  1.9  --   --   --   PHOS 3.0  --   --   --   --   --   --    < > = values in this interval not displayed.   GFR: Estimated Creatinine Clearance: 70.8 mL/min (by C-G formula based on SCr of 0.93 mg/dL). Liver Function Tests: Recent Labs  Lab 07/01/20 0323  AST 24  ALT 15  ALKPHOS 26*  BILITOT 1.6*  PROT 5.8*  ALBUMIN 2.9*   No results for input(s): LIPASE, AMYLASE in the last 168 hours. Recent Labs  Lab 06/30/20 2122  AMMONIA 28   Coagulation Profile: No  results for input(s): INR, PROTIME in the last 168 hours. Cardiac Enzymes: Recent Labs  Lab 06/30/20 2122  CKTOTAL 398*   BNP (last 3 results) No results for  input(s): PROBNP in the last 8760 hours. HbA1C: No results for input(s): HGBA1C in the last 72 hours. CBG: Recent Labs  Lab 06/30/20 1544  GLUCAP 114*   Lipid Profile: No results for input(s): CHOL, HDL, LDLCALC, TRIG, CHOLHDL, LDLDIRECT in the last 72 hours. Thyroid Function Tests: No results for input(s): TSH, T4TOTAL, FREET4, T3FREE, THYROIDAB in the last 72 hours. Anemia Panel: No results for input(s): VITAMINB12, FOLATE, FERRITIN, TIBC, IRON, RETICCTPCT in the last 72 hours. Urine analysis:    Component Value Date/Time   COLORURINE YELLOW 07/04/2020 [place] 07/04/2020 1219   LABSPEC 1.016 07/04/2020 1219   PHURINE 5.0 07/04/2020 1219   GLUCOSEU NEGATIVE 07/04/2020 1219   HGBUR MODERATE (A) 07/04/2020 1219   BILIRUBINUR NEGATIVE 07/04/2020 1219   KETONESUR NEGATIVE 07/04/2020 1219   PROTEINUR NEGATIVE 07/04/2020 1219   NITRITE NEGATIVE 07/04/2020 1219   LEUKOCYTESUR NEGATIVE 07/04/2020 1219   Sepsis Labs: @LABRCNTIP (procalcitonin:4,lacticidven:4)  ) Recent Results (from the past 240 hour(s))  Blood Culture (routine x 2)     Status: None   Collection Time: 06/30/20  8:31 AM   Specimen: BLOOD LEFT ARM  Result Value Ref Range Status   Specimen Description   Final    BLOOD LEFT ARM Performed at [facility], [address] [place], [place] [address]    Special Requests   Final    BOTTLES DRAWN AEROBIC AND ANAEROBIC Blood Culture adequate volume Performed at [facility], [address] [place], [place] [address]    Culture   Final    NO GROWTH 5 DAYS Performed at [facility], [place] [address], [place], [place] [address]    Report Status 07/05/2020 FINAL  Final  Urine culture     Status: None   Collection Time: 06/30/20  8:32 AM   Specimen: In/Out Cath Urine  Result Value Ref Range Status   Specimen Description   Final    IN/OUT CATH URINE Performed at [facility], [address] [place], [place]  [address]    Special Requests   Final    NONE Performed at [facility], [address] [place], [place] [address]    Culture   Final    NO GROWTH Performed at [facility], [place] [address], [place], [place] [address]    Report Status 07/01/2020 FINAL  Final  Blood Culture (routine x 2)     Status: None   Collection Time: 06/30/20  9:00 AM   Specimen: BLOOD LEFT HAND  Result Value Ref Range Status   Specimen Description   Final    BLOOD LEFT HAND Performed at [facility], [address] [place], [place] [address]    Special Requests   Final    BOTTLES DRAWN AEROBIC AND ANAEROBIC Blood Culture adequate volume Performed at [facility], [address] [place], [place] [address]    Culture   Final    NO GROWTH 5 DAYS Performed at [facility], [place] [address], [place], [place] [address]    Report Status 07/05/2020 FINAL  Final  Resp Panel by RT-PCR (Flu A&B, Covid) Nasopharyngeal Swab     Status: None   Collection Time: 06/30/20 10:34 AM  Specimen: Nasopharyngeal Swab; Nasopharyngeal(NP) swabs in vial transport medium  Result Value Ref Range Status   SARS Coronavirus 2 by RT PCR NEGATIVE NEGATIVE Final    Comment: (NOTE) SARS-CoV-2 target nucleic acids are NOT DETECTED.  The SARS-CoV-2 RNA is generally detectable in upper respiratory specimens during the acute phase of infection. The lowest concentration of SARS-CoV-2 viral copies this assay can detect is 138 copies/mL. A negative result does not preclude SARS-Cov-2 infection and should not be used as the sole basis for treatment or other patient management decisions. A negative result may occur with  improper specimen collection/handling, submission of specimen other than nasopharyngeal swab, presence of viral mutation(s) within the areas targeted by this assay, and inadequate number of viral copies(<138 copies/mL). A negative result must be combined with clinical observations,  patient history, and epidemiological information. The expected result is Negative.  Fact Sheet for Patients:  EntrepreneurPulse.com.au  Fact Sheet for Healthcare Providers:  IncredibleEmployment.be  This test is no t yet approved or cleared by the [place] FDA and  has been authorized for detection and/or diagnosis of SARS-CoV-2 by FDA under an Emergency Use Authorization (EUA). This EUA will remain  in effect (meaning this test can be used) for the duration of the COVID-19 declaration under Section 564(b)(1) of the Act, 21 U.S.C.section 360bbb-3(b)(1), unless the authorization is terminated  or revoked sooner.       Influenza A by PCR NEGATIVE NEGATIVE Final   Influenza B by PCR NEGATIVE NEGATIVE Final    Comment: (NOTE) The Xpert Xpress SARS-CoV-2/FLU/RSV plus assay is intended as an aid in the diagnosis of influenza from Nasopharyngeal swab specimens and should not be used as a sole basis for treatment. Nasal washings and aspirates are unacceptable for Xpert Xpress SARS-CoV-2/FLU/RSV testing.  Fact Sheet for Patients: EntrepreneurPulse.com.au  Fact Sheet for Healthcare Providers: IncredibleEmployment.be  This test is not yet approved or cleared by the [place] FDA and has been authorized for detection and/or diagnosis of SARS-CoV-2 by FDA under an Emergency Use Authorization (EUA). This EUA will remain in effect (meaning this test can be used) for the duration of the COVID-19 declaration under Section 564(b)(1) of the Act, 21 U.S.C. section 360bbb-3(b)(1), unless the authorization is terminated or revoked.  Performed at [facility], [address] [place], [place] [address]   CSF culture     Status: None   Collection Time: 07/03/20  4:10 PM   Specimen: PATH Cytology CSF; Cerebrospinal Fluid  Result Value Ref Range Status   Specimen Description CSF  Final   Special Requests  NONE  Final   Gram Stain   Final    WBC PRESENT, PREDOMINANTLY MONONUCLEAR NO ORGANISMS SEEN CYTOSPIN SMEAR    Culture   Final    NO GROWTH 3 DAYS Performed at [facility], [place] [address], [place], [place] [address]    Report Status 07/07/2020 FINAL  Final      Radiology Studies: No results found.   Scheduled Meds: . aspirin EC  81 mg Oral Daily  . Chlorhexidine Gluconate Cloth  6 each Topical Daily  . [START ON 07/12/2020] cyanocobalamin  1,000 mcg Intramuscular Q Thu   Followed by  . [START ON 08/30/2020] cyanocobalamin  1,000 mcg Intramuscular Q28 days  . lidocaine  1 application Urethral Once  . naproxen  500 mg Oral BID WC  . OLANZapine  2.5 mg Oral Daily  . OLANZapine zydis  2.5 mg Oral QHS  . pantoprazole (PROTONIX)  IV  40 mg Intravenous Q12H  . polyethylene glycol  17 g Oral BID  . rosuvastatin  20 mg Oral QHS  . senna-docusate  1 tablet Oral BID  . tamsulosin  0.4 mg Oral Daily  . [START ON 07/10/2020] thiamine injection  100 mg Intravenous Daily   Continuous Infusions: . sodium chloride 10 mL/hr at 07/03/20 0534  . thiamine injection 250 mg (07/07/20 1146)     LOS: 6 days   Time Spent in minutes   30 minutes  Wilmary Levit D.O. on 07/07/2020 at 1:18 PM  Between 7am to 7pm - Please see pager noted on [email or web address]  After 7pm go to [email or web address]  And look for the night coverage person covering for me after hours  [facility] Office  [phone]

## 2020-07-08 DIAGNOSIS — I10 Essential (primary) hypertension: Secondary | ICD-10-CM | POA: Diagnosis not present

## 2020-07-08 DIAGNOSIS — R6510 Systemic inflammatory response syndrome (SIRS) of non-infectious origin without acute organ dysfunction: Secondary | ICD-10-CM | POA: Diagnosis not present

## 2020-07-08 MED ORDER — PANTOPRAZOLE SODIUM 40 MG PO TBEC
40.0000 mg | DELAYED_RELEASE_TABLET | Freq: Two times a day (BID) | ORAL | Status: DC
  Administered 2020-07-08 – 2020-07-12 (×7): 40 mg via ORAL
  Filled 2020-07-08 (×7): qty 1

## 2020-07-08 MED ORDER — HALOPERIDOL LACTATE 5 MG/ML IJ SOLN
2.0000 mg | Freq: Once | INTRAMUSCULAR | Status: AC
  Administered 2020-07-08: 2 mg via INTRAVENOUS
  Filled 2020-07-08: qty 1

## 2020-07-08 MED ORDER — QUETIAPINE FUMARATE 25 MG PO TABS
25.0000 mg | ORAL_TABLET | Freq: Two times a day (BID) | ORAL | Status: DC
  Administered 2020-07-08 – 2020-07-11 (×6): 25 mg via ORAL
  Filled 2020-07-08 (×6): qty 1

## 2020-07-08 MED ORDER — HALOPERIDOL 0.5 MG PO TABS: 0.5000 mg | ORAL_TABLET | Freq: Once | ORAL | Status: AC

## 2020-07-08 MED ORDER — HALOPERIDOL LACTATE 5 MG/ML IJ SOLN
INTRAMUSCULAR | Status: AC
  Filled 2020-07-08: qty 1

## 2020-07-08 MED ORDER — QUETIAPINE FUMARATE 50 MG PO TABS
50.0000 mg | ORAL_TABLET | Freq: Every day | ORAL | Status: DC
  Administered 2020-07-08 – 2020-07-10 (×3): 50 mg via ORAL
  Filled 2020-07-08 (×3): qty 1

## 2020-07-08 MED ORDER — HALOPERIDOL LACTATE 5 MG/ML IJ SOLN
1.0000 mg | Freq: Once | INTRAMUSCULAR | Status: AC
  Administered 2020-07-08: 1 mg via INTRAMUSCULAR

## 2020-07-08 MED ORDER — OLANZAPINE 5 MG PO TABS
5.0000 mg | ORAL_TABLET | Freq: Every day | ORAL | Status: DC
  Filled 2020-07-08: qty 1

## 2020-07-08 MED ORDER — OLANZAPINE 2.5 MG PO TABS: 2.5000 mg | ORAL_TABLET | Freq: Every day | ORAL | Status: DC

## 2020-07-08 MED ORDER — HALOPERIDOL LACTATE 5 MG/ML IJ SOLN
INTRAMUSCULAR | Status: AC
  Administered 2020-07-08: 2 mg
  Filled 2020-07-08: qty 1

## 2020-07-08 NOTE — Progress Notes (Signed)
This nurse agrees with previous nurse assessment.

## 2020-07-08 NOTE — TOC Initial Note (Signed)
Transition of Care [facility]) - Initial/Assessment Note    Patient Details  Name: Patrick Macias MRN: [id number] Date of Birth: [birth year]  [facility] CM/SW Contact:    Carles Collet, RN Phone Number: 07/08/2020, 8:19 AM  Clinical Narrative:            Damaris Schooner w patient and daughter at bedside yesterday. Patient confused, had difficulty following directions to take medications, responded to "show me how you open your mouth" so medications could be administered, crushed in sherbet. Discussed DC planning process with daughter. She states PTA patient was fully functional, independent, driving, still working some, living at home w wife who is independent as well. We discussed process if he were to need inpatient geri psych, that the hospital [facility] team would arrange that with the family. Daughter relieved as they thought the burden of placement would be solely on the family. She would want him to go home if he mentally clears, as may be expected per psych note. We discussed [place] services and how much time they spend in the home and the scope of their services, and that TOC would set that up with the family. We also discussed private duty care, and that it would need to be arranged by the family and that it is paid out of pocket. We discussed family also staying with the patient after discharge for a while as an option too.  Currently waiting to see how much improvement the patient makes with his confusion before final disposition is determined.           Expected Discharge Plan:  (TBD) Barriers to Discharge: Continued Medical Work up   Patient Goals and CMS Choice Patient states their goals for this hospitalization and ongoing recovery are:: prefer least restrictive environment as possible, home if safe      Expected Discharge Plan and Services Expected Discharge Plan:  (TBD)       Living arrangements for the past 2 months: Single Family Home                                       Prior Living Arrangements/Services Living arrangements for the past 2 months: Single Family Home Lives with:: Spouse                   Activities of Daily Living Home Assistive Devices/Equipment: None ADL Screening (condition at time of admission) Patient's cognitive ability adequate to safely complete daily activities?: No Is the patient deaf or have difficulty hearing?: Yes Does the patient have difficulty seeing, even when wearing glasses/contacts?: No Does the patient have difficulty concentrating, remembering, or making decisions?: Yes Patient able to express need for assistance with ADLs?: No Does the patient have difficulty dressing or bathing?: Yes Independently performs ADLs?: No Does the patient have difficulty walking or climbing stairs?: Yes Weakness of Legs: None Weakness of Arms/Hands: Both  Permission Sought/Granted                  Emotional Assessment              Admission diagnosis:  SIRS (systemic inflammatory response syndrome) (HCC) [R65.10] Pain of right hand [M79.641] Generalized weakness [R53.1] Urticaria [W09.8] Metabolic encephalopathy [J19.14] Patient Active Problem List   Diagnosis Date Noted  . SIRS (systemic inflammatory response syndrome) ([place]) 06/30/2020  . Metabolic encephalopathy 78/29/5621  . Cognitive impairment 06/04/2020  . TIA (  transient ischemic attack) 06/01/2020  . Dementia without behavioral disturbance ([place]) 06/01/2020  . Anxiety 06/01/2020  . HTN (hypertension) 06/01/2020  . Right shoulder pain 08/10/2013   PCP:  Crist Infante, MD Pharmacy:   [facility] [place], [place] [address] Phone: [phone] Fax: [phone]     Social Determinants of Health (SDOH) Interventions    Readmission Risk Interventions No flowsheet data found.

## 2020-07-08 NOTE — Progress Notes (Signed)
Patient getting out of bed; unsteady on his feet; not following instructions of the staff to prevent a fall; becoming increasingly agitated;  unable to reorient or redirect patient; patient very high fall risk and not presently safe to make decisions; not responding to family support at this time; security present to assist staff; MD paged and orders received; patient placed in soft restraints per orders; family understanding and present at bedside.

## 2020-07-08 NOTE — Progress Notes (Signed)
PHARMACIST - PHYSICIAN COMMUNICATION  DR:   Ree Kida  CONCERNING: IV to Oral Route Change Policy  RECOMMENDATION: This patient is receiving protonix by the intravenous route.  Based on criteria approved by the Pharmacy and Therapeutics Committee, the intravenous medication(s) is/are being converted to the equivalent oral dose form(s).   DESCRIPTION: These criteria include:  The patient is eating (either orally or via tube) and/or has been taking other orally administered medications for a least 24 hours  The patient has no evidence of active gastrointestinal bleeding or impaired GI absorption (gastrectomy, short bowel, patient on TNA or NPO).  If you have questions about this conversion, please contact the Pharmacy Department  []   [phone] )  [facility] []   [phone] )  [facility] [x]   [phone] )  [facility] []   [phone] )  [facility] []   [phone] )  [place], [place] 07/08/2020 2:07 PM

## 2020-07-08 NOTE — Progress Notes (Signed)
Physical Therapy Treatment Patient Details Name: Patrick Macias MRN: [id number] DOB: 1940-11-23 Today's Date: 07/08/2020    History of Present Illness Pt adm with AMS, fever of unknown origin and rash/hives. Brain MRI negative for acute abnormality. ? gout.  PMH - dementia, HTN, ?TIA.    PT Comments    Pt making significant progress this date as he was more awake and able to participate. Pt hallucinating and displaying STM deficits, repeating questions and comments often. Max multi-modal cues required for pt to follow simple commands. ModA for all bed mob due to difficulty comprehending and following directions. Initially, pt displayed a strong posterior lean coming to stand the first rep with modA and ambulating with maxA utilizing a RW. As the posterior lean decreased his independence increased with him requiring minA for sit to stand transfers and modA for ambulating. Pt ambulated x2 bouts of ~285 ft > ~25 ft with a RW, initially requiring full assistance to advance the RW to decrease the posterior lean, with success, and to inc gait speed which improved his gait pattern. With inc speed his posture improved with him looking more anteriorly than inferiorly and his step length improved drastically. He continued to drift and stagger to the L though and not remain within the RW despite max cues. Will continue to follow acutely. Current recommendations remain appropriate if pt continues to improve with arousal and cognition allowing for increased functional mob independence and safety.   Follow Up Recommendations  Home health PT;Supervision/Assistance - 24 hour     Equipment Recommendations  3in1 (PT);Wheelchair (measurements PT);Wheelchair cushion (measurements PT);Rolling walker with 5" wheels    Recommendations for Other Services       Precautions / Restrictions Precautions Precautions: Fall Precaution Comments: impulsive Restrictions Weight Bearing Restrictions: No    Mobility  Bed  Mobility Overal bed mobility: Needs Assistance Bed Mobility: Supine to Sit;Sit to Supine     Supine to sit: Mod assist;HOB elevated Sit to supine: Mod assist   General bed mobility comments: Cued pt to bring legs off EOB but pt would start to move them and then move them back. Multiple deifferent cues attempted to get pt to sit up or return to supine, ultimately requiring modA at trunk and legs to initiate and complete.  Transfers Overall transfer level: Needs assistance Equipment used: Rolling walker (2 wheeled) Transfers: Sit to/from Stand Sit to Stand: Mod assist         General transfer comment: Sit to stand from EOB displaying a posterior lean resulting in modA first bout. MinA for subsequent 1x from chair and 1x from EOB as posterior lean decreased with cues for hand placement.  Ambulation/Gait Ambulation/Gait assistance: Max assist Gait Distance (Feet): 285 Feet (x2 bouts of ~285 ft > ~25 ft) Assistive device: Rolling walker (2 wheeled) Gait Pattern/deviations: Step-through pattern;Decreased step length - right;Decreased step length - left;Decreased stride length;Staggering left;Drifts right/left;Trunk flexed;Narrow base of support;Shuffle Gait velocity: decreased Gait velocity interpretation: 1.31 - 2.62 ft/sec, indicative of limited community ambulator General Gait Details: Initially requiring maxA to advance the RW to decrease his posterior lean, with success, but pt would stop ambulating when assistance to push RW was removed. However, as distance progressed and RW was pushed by therapist faster to encourage inc gait speed pt's posterior lean, trunk flexion, inferior gaze, and step length improved to him then starting to push the RW only needing manual assistance to remain within it and turn it. Pt drifts and staggers to L, requiring max cues  to try to remain midline in RW with no carryover. ModA for gait by end of gait bouts.   Stairs             Wheelchair  Mobility    Modified Rankin (Stroke Patients Only)       Balance Overall balance assessment: Needs assistance Sitting-balance support: Feet supported;No upper extremity supported Sitting balance-Leahy Scale: Fair Sitting balance - Comments: Static sitting EOB without LOB, min guard for safety.   Standing balance support: Bilateral upper extremity supported;During functional activity Standing balance-Leahy Scale: Poor Standing balance comment: Progressed from maxA > modA and bilat UE support on therapist or RW to maintain standing balance.                            Cognition Arousal/Alertness: Awake/alert Behavior During Therapy: WFL for tasks assessed/performed Overall Cognitive Status: Impaired/Different from baseline Area of Impairment: Following commands;Orientation;Attention;Safety/judgement;Awareness;Problem solving;Memory                 Orientation Level: Disoriented to;Time;Situation;Place;Person Current Attention Level: Alternating Memory: Decreased recall of precautions;Decreased short-term memory Following Commands: Follows one step commands with increased time;Follows one step commands inconsistently Safety/Judgement: Decreased awareness of safety;Decreased awareness of deficits Awareness: Intellectual Problem Solving: Difficulty sequencing;Decreased initiation;Requires tactile cues;Requires verbal cues;Slow processing General Comments: Pt unable to state his DOB. Pt awake and impulsive needing cues for safety throughout session. STM deficits asking several times how he knew the therapist or what the therapist does. Pt hallucinating taking imaginery pills and reaching for a non-existent baseball. Max multi-modal cues throughout repeated with pt eventually following some simple directions.      Exercises      General Comments        Pertinent Vitals/Pain Pain Assessment: Faces Faces Pain Scale: No hurt Pain Intervention(s): Monitored during  session    Home Living                      Prior Function            PT Goals (current goals can now be found in the care plan section) Acute Rehab PT Goals Patient Stated Goal: pt did not explicitly state but family desired for pt to improve PT Goal Formulation: With patient/family Time For Goal Achievement: 07/15/20 Potential to Achieve Goals: Good Progress towards PT goals: Progressing toward goals    Frequency    Min 3X/week      PT Plan Current plan remains appropriate    Co-evaluation              AM-PAC PT "6 Clicks" Mobility   Outcome Measure  Help needed turning from your back to your side while in a flat bed without using bedrails?: A Lot Help needed moving from lying on your back to sitting on the side of a flat bed without using bedrails?: A Lot Help needed moving to and from a bed to a chair (including a wheelchair)?: A Lot Help needed standing up from a chair using your arms (e.g., wheelchair or bedside chair)?: A Lot Help needed to walk in hospital room?: A Lot Help needed climbing 3-5 steps with a railing? : Total 6 Click Score: 11    End of Session Equipment Utilized During Treatment: Gait belt Activity Tolerance: Patient tolerated treatment well Patient left: in bed;with call bell/phone within reach;with bed alarm set;with family/visitor present Nurse Communication: Other (comment) (request by family for "fidget blanket") PT Visit  Diagnosis: Unsteadiness on feet (R26.81);Other abnormalities of gait and mobility (R26.89);Muscle weakness (generalized) (M62.81);Difficulty in walking, not elsewhere classified (R26.2)     Time: 1457-1540 PT Time Calculation (min) (ACUTE ONLY): 43 min  Charges:  $Gait Training: 23-37 mins $Therapeutic Activity: 8-22 mins                     Moishe Spice, PT, DPT Acute Rehabilitation Services  Pager: [phone] Office: [place] 07/08/2020, 3:58 PM

## 2020-07-08 NOTE — Progress Notes (Signed)
Received from [facility] via bed; daughter and wife at bedside; patient is alert but confused to time and place; he can state his wife and daughter's names.  Poor attention and picking at the bed linen; not itching or scratching his skin; restless intermittently but following commands.  PT is coming to work with him.  Oriented patient and family to the unit and unit routines.  Bed is low and locked with bed alarm on for safety. Daughter request to be present with her mother; the patient's wife has communication needs herself and needs 2 visitors at the bedside.  Family seeking private sitter as well.

## 2020-07-08 NOTE — Consult Note (Addendum)
[facility] Face-to-Face Psychiatry Consult   Reason for Consult:  Delirium Referring Physician:  Dr. Ree Kida Patient Identification: Patrick Macias MRN:  [id number] Principal Diagnosis: <principal problem not specified> Diagnosis:  Active Problems:   HTN (hypertension)   SIRS (systemic inflammatory response syndrome) (Patrick Macias)   Metabolic encephalopathy   Total Time spent with patient: 45 minutes  Subjective:   Patrick Macias is a 80 y.o. male patient admitted with altered mental status. Psych consult was originally placed 5 days ago for delirium on chronic dementia, hallucinations, agitation and aggression. Patient remained disoriented and or sedated on most attempts to assess him. He was started on olanzapine 2.5mg  which was adjusted over time. We also discontinued use of benzodiazapines and additional haldol that was being used to manage his agitation, restlessness and aggression. He appeared to be having a paradoxical reaction to ativan, also in the setting of his delirium. It was an obvious change from his baseline which confirmed the diagnosis of delirium.   On todays evaluation patient is alert and oriented, calm and cooperative and engages well with Patrick officer. Patient remains pleasant confused, however he is able to hold a linear conversations and appears to answer all questions appropriately for me. He is able to name his friend who is at the bedside, stating "we worked together or rather played together. When you have so much fun doing what you do at work it is play. " He is observed tampering with his urinary catheter and then shuffling with the seizure pads on the side of the bed. He is easily redirected, and follows commands when told the purpose of the catheter. He denies any previous psychiatric history. He denies any previous suicide attempts.   HPI:  Patrick Macias is a 80 y.o. male with hx of vascular dementia, BPH, dyslipidemia, hypertension, who presented to the facility on 06/30/2020  secondary to altered mental status, generalized weakness, fever.  At the time of exam, patient is cephalopathic, thus a complete and thorough HPI review systems unable to be fully obtained.  Information obtained from chart and daughter and wife bedside.  Apparently on Monday, patient developed hives on different parts of his body that on his body but was primarily his back, not in 1 dermatome.  He tried to take Zyrtec with no improvement of his rash.  He had been sleeping well due to the rash.  Then apparently yesterday he hit his hand on something but family does not know what.  Then the morning presentation, patient had a fever, and wife found him in bed significantly altered, encephalopathic, with inability to get out of bed.  He had abnormal speech, shaking, and was not a self and thus EMS was contacted to bring him.  Of note, patient was recently hospitalized in the beginning of December 2021 for encephalopathy.  MRI did not show a CVA but showed findings concerning with chronic microvascular ischemia.  Neurology was consulted and evaluated patient.  Per family there was discussion that he has vascular dementia.  He was scheduled to follow-up with neurology outpatient.  Psychogenic drugs he is on are Lexapro, and galantamine.   Past Psychiatric History: Vascular dementia. Currently taking Lexapro and galantamine. He is under the care of Patrick Macias, since his previous TIA in 05/2020. He denies any previous suicide attempts. He denies any previous inpatient admissions.   Risk to Self:  Denies Risk to Others:  Denies Prior Inpatient Therapy:  Denies Prior Outpatient Therapy:   Denies any current psychiatric  providers.   Past Medical History:  Past Medical History:  Diagnosis Date  . Anxiety   . BPH (benign prostatic hyperplasia)   . Cerumen impaction   . Dyslipidemia   . H/O lithotripsy   . Hearing decreased, bilateral   . Hx of colonic polyps   . Hypertension    . IFG (impaired fasting glucose)   . Macrocytosis   . Nephrolithiasis   . Vitamin D deficiency     Past Surgical History:  Procedure Laterality Date  . CATARACT EXTRACTION, BILATERAL    . PROSTATECTOMY     Family History:  Family History  Problem Relation Age of Onset  . Sudden death Neg Hx   . Hypertension Neg Hx   . Hyperlipidemia Neg Hx   . Diabetes Neg Hx   . Heart attack Neg Hx    Family Psychiatric  History: Denies  Social History:  Social History   Substance and Sexual Activity  Alcohol Use Not Currently     Social History   Substance and Sexual Activity  Drug Use Never    Social History   Socioeconomic History  . Marital status: Married    Spouse name: Not on file  . Number of children: Not on file  . Years of education: Not on file  . Highest education level: Not on file  Occupational History  . Not on file  Tobacco Use  . Smoking status: Never Smoker  . Smokeless tobacco: Never Used  Substance and Sexual Activity  . Alcohol use: Not Currently  . Drug use: Never  . Sexual activity: Not on file  Other Topics Concern  . Not on file  Social History Narrative  . Not on file   Social Determinants of Health   Financial Resource Strain: Not on file  Food Insecurity: Not on file  Transportation Needs: Not on file  Physical Activity: Not on file  Stress: Not on file  Social Connections: Not on file   Additional Social History:    Allergies:  No Known Allergies  Labs:  Results for orders placed or performed during the hospital encounter of 06/30/20 (from the past 48 hour(s))  Basic metabolic panel     Status: Abnormal   Collection Time: 07/07/20  3:42 AM  Result Value Ref Range   Sodium 139 135 - 145 mmol/L   Potassium 3.9 3.5 - 5.1 mmol/L   Chloride 103 98 - 111 mmol/L   CO2 25 22 - 32 mmol/L   Glucose, Bld 90 70 - 99 mg/dL    Comment: Glucose reference range applies only to samples taken after fasting for at least 8 hours.   BUN 12 8 -  23 mg/dL   Creatinine, Ser 0.93 0.61 - 1.24 mg/dL   Calcium 8.7 (L) 8.9 - 10.3 mg/dL   GFR, Estimated >60 >60 mL/min    Comment: (NOTE) Calculated using the CKD-EPI Creatinine Equation (2021)    Anion gap 11 5 - 15    Comment: Performed at [facility] [address], [place], [place] [address]    Current Facility-Administered Medications  Medication Dose Route Frequency Provider Last Rate Last Admin  . 0.9 %  sodium chloride infusion   Intravenous PRN Charlynne Cousins, MD 10 mL/hr at 07/03/20 0534 Restarted at 07/03/20 0534  . acetaminophen (TYLENOL) tablet 650 mg  650 mg Oral Q6H PRN Doran Heater, DO   650 mg at 07/04/20 1208   Or  . acetaminophen (TYLENOL) suppository 650 mg  650 mg Rectal Q6H PRN Doran Heater, DO   650 mg at 07/01/20 1357  . albuterol (PROVENTIL) (2.5 MG/3ML) 0.083% nebulizer solution 2.5 mg  2.5 mg Nebulization Q2H PRN Doran Heater, DO      . aspirin EC tablet 81 mg  81 mg Oral Daily MacNeil, Richard G, DO   81 mg at 07/08/20 0900  . bisacodyl (DULCOLAX) EC tablet 5 mg  5 mg Oral Daily PRN Cristal Ford, DO   5 mg at 07/07/20 1118  . camphor-menthol (SARNA) lotion 1 application  1 application Topical PRN Zierle-Ghosh, Asia B, DO      . Chlorhexidine Gluconate Cloth 2 % PADS 6 each  6 each Topical Daily Cristal Ford, DO   6 each at 07/08/20 0901  . [START ON 07/12/2020] cyanocobalamin ((VITAMIN B-12)) injection 1,000 mcg  1,000 mcg Intramuscular Q Thu Charlynne Cousins, MD       Followed by  . [START ON 08/30/2020] cyanocobalamin ((VITAMIN B-12)) injection 1,000 mcg  1,000 mcg Intramuscular Q28 days Charlynne Cousins, MD      . lidocaine (XYLOCAINE) 2 % jelly 1 application  1 application Urethral Once Zierle-Ghosh, Asia B, DO      . melatonin tablet 3 mg  3 mg Oral QHS PRN Opyd, Ilene Qua, MD   3 mg at 07/07/20 2200  . morphine 2 MG/ML injection 2 mg  2 mg Intravenous BID PRN Cristal Ford, DO      . Muscle Rub CREA   Topical  PRN Cristal Ford, DO   Given at 07/06/20 0530  . naproxen (NAPROSYN) tablet 500 mg  500 mg Oral BID WC Charlynne Cousins, MD   500 mg at 07/08/20 0900  . OLANZapine (ZYPREXA) tablet 2.5 mg  2.5 mg Oral Daily Suella Broad, FNP   2.5 mg at 07/08/20 0900  . OLANZapine zydis (ZYPREXA) disintegrating tablet 2.5 mg  2.5 mg Oral QHS Suella Broad, FNP   2.5 mg at 07/07/20 2200  . ondansetron (ZOFRAN) tablet 4 mg  4 mg Oral Q6H PRN Doran Heater, DO       Or  . ondansetron (ZOFRAN) injection 4 mg  4 mg Intravenous Q6H PRN MacNeil, Richard G, DO      . pantoprazole (PROTONIX) injection 40 mg  40 mg Intravenous Q12H Charlynne Cousins, MD   40 mg at 07/07/20 2202  . pneumococcal 23 valent vaccine (PNEUMOVAX-23) injection 0.5 mL  0.5 mL Intramuscular Tomorrow-1000 Mikhail, [place], DO      . polyethylene glycol (MIRALAX / GLYCOLAX) packet 17 g  17 g Oral BID Cristal Ford, DO   17 g at 07/08/20 0859  . rosuvastatin (CRESTOR) tablet 20 mg  20 mg Oral QHS MacNeil, Richard G, DO   20 mg at 07/07/20 2200  . senna-docusate (Senokot-S) tablet 1 tablet  1 tablet Oral BID Cristal Ford, DO   1 tablet at 07/08/20 0900  . tamsulosin (FLOMAX) capsule 0.4 mg  0.4 mg Oral Daily Cristal Ford, DO   0.4 mg at 07/08/20 0859  . thiamine (B-1) 250 mg in sodium chloride 0.9 % 50 mL IVPB  250 mg Intravenous Daily Donnetta Simpers, MD 100 mL/hr at 07/07/20 1146 250 mg at 07/07/20 1146   Followed by  . [START ON 07/10/2020] thiamine (B-1) injection 100 mg  100 mg Intravenous Daily Donnetta Simpers, MD      . traMADol Veatrice Bourbon) tablet 50 mg  50 mg Oral Q6H PRN Cristal Ford, DO  50 mg at 07/06/20 2202    Musculoskeletal: Strength & Muscle Tone: within normal limits Gait & Station: normal Patient leans: N/A  Psychiatric Specialty Exam: Physical Exam  Review of Systems  Blood pressure (!) 165/73, pulse (!) 56, temperature 98 F (36.7 C), resp. rate 16, height 5\' 11"  (1.803 m),  weight 80.2 kg, SpO2 98 %.Body mass index is 24.66 kg/m.  General Appearance: Casual, Fairly Groomed and Neat  Eye Contact:  Fair  Speech:  Clear and Coherent and Normal Rate  Volume:  Normal  Mood:  Anxious  Affect:  Appropriate and Congruent  Thought Process:  Coherent, Linear and Descriptions of Associations: Intact  Orientation:  Full (Time, Place, and Person)  Thought Content:  Logical  Suicidal Thoughts:  No  Homicidal Thoughts:  No  Memory:  Immediate;   Fair Recent;   Fair  Judgement:  Intact  Insight:  Present  Psychomotor Activity:  Normal  Concentration:  Attention Span: Fair  Recall:  [employer] of Knowledge:  Fair  Language:  Fair  Akathisia:  No  Handed:  Right  AIMS (if indicated):     Assets:  Communication Skills Desire for Improvement Financial Resources/Insurance Housing Intimacy Leisure Time Physical Health Social Support  ADL's:  Intact  Cognition:  Impaired,  Mild  Sleep:     Patient likely has ongoing delirium which presents as fluctuating cognition, his current restlessness is presenting as agitation. As noted before patient is easily redirected, and would benefit from increased mobility to include ambulation and or relocating from bed to the chair. The patient exam continues to be different from his baseline, will continue with treatment below.   Treatment Plan":Patient has greatly improved since being followed by psych consult service. He is able to hold a linear conversation, is easily redirected, and able to follow commands. Patient remains pleasantly confused.   -Delirium : improving, increase zyprexa 5mg  po qhs and zyprexa 2.5mg  po daily to further target symptoms of confusion, restlessness and agitation. Patient will continue to require ongoing redirection at times as his delirium continues to be present. Continue with delirium precautions and limit use of multiple psychotropic medications.  We continue to make ongoing medication changes at  request of family, and patient will benefit from inpatient admission for medication stabilization.  -Safety sitters offer supportive interaction with patient, and can easily identify patient needs, reorient, and address environmental factors that contribute to the delirium.  - If patient remains agitated and trying to remove medical monitoring devices or IVs, may need to consider discontinuing hiding unnecessary devices to further decrease delirium and agitation.   Dementia- continue recommendations per his neurological team. He was recently relocated to 3W our nuero progressive unit which will be able to better manage his neuro deficits and cognitive impairment.   Disposition: Recommend psychiatric Inpatient admission when medically cleared.   Addendum this nurse practitioner received a call from bedside nurse, reporting that family is concerned about the increased in the zyprexa dose and would like it reduced back to the original dose. Macias spoke with brandon (son), and informed him that his father received an additional dose of Haldol because he was agitated and restless today. He was also advised that he was exhibiting these behaviors prior to the zyprexa dose when I assessed him this morning. Emphasis placed on early identification and prevention of these behaviors to reduce him from being restrained. He is instructed that patient has been psych cleared and will be discharged home on zyprexa for  maintenance until assessed by psychiatry or neurology. Macias attempted to answer all questions and concerns, and allowed for brother to contact his sister on three way (unsuccessful). SUpport, encouragement and reassurance was offered. Delirium will take time to resolve before dad returns to his baseline.   Original recommendations were to follow up outpatient at geriatric psych doctor, however after speaking with Dr. Ree Kida and patients ongoing sundowning, worsening restlessness and agitation, will place  referral for inpatient geriatric pscyh hospital. Family will have difficult time managing this patient at home, despite his behaviors being normal. We also are going to adjust his medication, as the family is now refusing zyprexa. We will start 25mg  po BID at 8am and 4pm, then Seroquel 50mg  po qhs for management of delirium. Patient appears to be responding appropriately to antipsychotics, however he continues to exhibit restlessness, agitation, and aggression. He was also relocated to a new room (3E to 3W) on his 8th day of admission which likely will cause some regression in terms of his delirium.  Suella Broad, FNP 07/08/2020 1:16 PM

## 2020-07-08 NOTE — Plan of Care (Signed)
Patient has been sundowning and has been extremely agitated. Family feels that after receiving Zyprexa that patient becomes increasing agitated. Discussed with psychiatry, will discontinue zyprexa and add on Seroquel 50mg  QHS, 25mg  at 8am, and 25mg  at 4pm. Continue to use soft restraints for safety.   Nainoa Woldt D.O. [facility] 07/08/2020, 7:08 PM

## 2020-07-08 NOTE — Progress Notes (Signed)
PROGRESS NOTE    BEREKET GERNERT  TDD:[id number] DOB: March 07, 1941 DOA: 06/30/2020 PCP: Crist Infante, MD   Brief Narrative:  HPI on 06/30/2020 by Dr. Atha Starks MOE GRACA is a 80 y.o. male with hx of vascular dementia, BPH, dyslipidemia, hypertension, who presented to the facility on 06/30/2020 secondary to altered mental status, generalized weakness, fever.  At the time of exam, patient is cephalopathic, thus a complete and thorough HPI review systems unable to be fully obtained.  Information obtained from chart and daughter and wife bedside.  Apparently on Monday, patient developed hives on different parts of his body that on his body but was primarily his back, not in 1 dermatome.  He tried to take Zyrtec with no improvement of his rash.  He had been sleeping well due to the rash.  Then apparently yesterday he hit his hand on something but family does not know what.  Then the morning presentation, patient had a fever, and wife found him in bed significantly altered, encephalopathic, with inability to get out of bed.  He had abnormal speech, shaking, and was not a self and thus EMS was contacted to bring him.  Of note, patient was recently hospitalized in the beginning of December 2021 for encephalopathy.  MRI did not show a CVA but showed findings concerning with chronic microvascular ischemia.  Neurology was consulted and evaluated patient.  Per family there was discussion that he has vascular dementia.  He was scheduled to follow-up with neurology outpatient.  Psychogenic drugs he is on are Lexapro, and galantamine.  Interim history Admitted with AMS and fever. Neurology consulted. So far no infectious etiology found. S/p LP, unremarkable. Neurology feels this could be his dementia.  Also noted to have urinary retention, Foley catheter to be placed 07/04/20. Assessment & Plan   Acute metabolic encephalopathy/Acute delirium with underlying vascular dementia -Patient currently alert and  oriented x2 -Patient presented with fever and was placed empirically on IV vancomycin, cefepime and acyclovir -Culture data has remained negative to date -MRI brain showed no acute finding -EEG showed no signs of seizure.  Moderate diffuse encephalopathy nonspecific etiology. -Neurology consulted and appreciated, status post lumbar puncture -Discussed with neurology 07/04/20, Dr. Lorrin Goodell, recommended starting Seroquel at bedtime.  Have placed patient on 25 mg of Seroquel. -Neurology also recommended follow-up as an outpatient given some laboratory studies on the lumbar puncture will take several weeks to result. -Wife tells me that patient was doing well up until he recently had a TIA and thereafter had a gout flare. -Psychiatry consulted and appreciated.  Ativan discontinued, Zyprexa added 2.5 mg daily as well as 2.5 mg nightly. -Minimizing the use of IV morphine.  Tramadol has been added for pain management -Continue B12 supplemenation.  -Patient more alert and awake this morning, able to converse.  Still confused and not at baseline.  Fever of unknown origin -Chest x-ray shows no pneumonia -UA unremarkable for infection -Blood cultures show no growth to date -Patient was empirically started on acyclovir, vancomycin, and cefepime, which have now been discontinued -Question if this is due to underlying rheumatological issue -Patient does have a history of gout  -Currently afebrile, will continue to monitor  Polyarticular swelling and erythema -At this time no concern for infection given the imaging shows no effusions -Started on naproxen and PPI for gastric prophylaxis -Added tramadol for pain -PT and OT recommending home health  Essential hypertension -Allowing for permissive hypertension given question of TIA although work-up for TIA does seem  unremarkable at this time  Hypokalemia -Resolved with replacement, currently 3.9 -Magnesium 1.9 -Continue to monitor and replace as  needed  Acute urinary retention -Patient has had several in and out catheterizations he continues to have urinary retention -Possibly secondary to acute illness versus underlying prostate issues -Foley catheter was placed on 07/04/2020 -Continue Flomax -Urology consulted and appreciated and recommended continuing Foley catheter until acute issues have resolved.  May proceed with voiding trial as an inpatient however if unable to successfully pass, will follow up with outpatient urologist Dr. Terie Purser or patient may follow-up with [facility] for outpatient voiding trial.  Possible pneumonitis -Overnight 1/5-1/6, patient developed wheezing and other abnormal breath sounds as per family -CXR obtained and showed interstitial edema and/or pneumonitis -Discontinued IVF  -Patient was given dose of Lasix as well as nebulizer treatment -Improved aeration -Currently with no leukocytosis and afebrile  Constipation -Continue Senokot -Will add on suppository as needed  DVT Prophylaxis  SCDs  Code Status: Full  Family Communication: Wife at bedside.  Disposition Plan:  Status is: Inpatient  Remains inpatient appropriate because:Inpatient level of care appropriate due to severity of illness   Dispo: The patient is from: Home              Anticipated d/c is to: Home wit home health              Anticipated d/c date is: 2 days              Patient currently is not medically stable to d/c.  Consultants Neurology Urology Psychiatry   Procedures  Lumbar puncture  Antibiotics   Anti-infectives (From admission, onward)   Start     Dose/Rate Route Frequency Ordered Stop   07/02/20 1400  acyclovir (ZOVIRAX) 800 mg in dextrose 5 % 150 mL IVPB  Status:  Discontinued        800 mg 166 mL/hr over 60 Minutes Intravenous Every 8 hours 07/02/20 0913 07/03/20 1841   07/01/20 1445  vancomycin (VANCOREADY) IVPB 750 mg/150 mL  Status:  Discontinued        750 mg 150 mL/hr over 60 Minutes  Intravenous Every 12 hours 07/01/20 1354 07/03/20 1842   07/01/20 1445  acyclovir (ZOVIRAX) 800 mg in dextrose 5 % 150 mL IVPB  Status:  Discontinued        800 mg 166 mL/hr over 60 Minutes Intravenous Every 8 hours 07/01/20 1354 07/02/20 0825   07/01/20 1000  ceFEPIme (MAXIPIME) 2 g in sodium chloride 0.9 % 100 mL IVPB  Status:  Discontinued        2 g 200 mL/hr over 30 Minutes Intravenous Every 12 hours 06/30/20 2357 07/03/20 1842   06/30/20 2300  vancomycin (VANCOREADY) IVPB 750 mg/150 mL  Status:  Discontinued        750 mg 150 mL/hr over 60 Minutes Intravenous Every 12 hours 06/30/20 1147 06/30/20 2357   06/30/20 2200  ceFEPIme (MAXIPIME) 2 g in sodium chloride 0.9 % 100 mL IVPB  Status:  Discontinued        2 g 200 mL/hr over 30 Minutes Intravenous Every 12 hours 06/30/20 1147 06/30/20 2357   06/30/20 1400  metroNIDAZOLE (FLAGYL) tablet 500 mg  Status:  Discontinued        500 mg Oral Every 8 hours 06/30/20 1137 06/30/20 2357   06/30/20 1145  ceFEPIme (MAXIPIME) 2 g in sodium chloride 0.9 % 100 mL IVPB        2 g 200 mL/hr  over 30 Minutes Intravenous  Once 06/30/20 1137 06/30/20 1230   06/30/20 1145  vancomycin (VANCOCIN) IVPB 1000 mg/200 mL premix        1,000 mg 200 mL/hr over 60 Minutes Intravenous  Once 06/30/20 1137 06/30/20 1337      Subjective:   Kathryne Hitch seen and examined today.  Patient alert this morning and talkative.  Still had a restless night per wife.  Currently denies any pain.   Objective:   Vitals:   07/06/20 2100 07/07/20 2104 07/08/20 0310 07/08/20 0311  BP: 122/76 (!) 145/67 (!) 165/73 (!) 165/73  Pulse: (!) 56 67 68 (!) 56  Resp: 13 16 16 16   Temp:   98 F (36.7 C) 98 F (36.7 C)  TempSrc:   Oral   SpO2:  98% 98%   Weight:   80.2 kg   Height:        Intake/Output Summary (Last 24 hours) at 07/08/2020 1033 Last data filed at 07/08/2020 0312 Gross per 24 hour  Intake --  Output 1475 ml  Net -1475 ml   Filed Weights   07/04/20 0400 07/06/20  0600 07/08/20 0310  Weight: 83.4 kg 81.3 kg 80.2 kg   Exam  General: Well developed, chronically ill-appearing, NAD  HEENT: NCAT, mucous membranes moist.   Cardiovascular: S1 S2 auscultated, RRR.  Respiratory: Diminished however clear  Abdomen: Soft, nontender, nondistended, + bowel sounds  Extremities: warm dry without cyanosis clubbing or edema  Neuro: AAOx1 (self), nonfocal  Psych: appropriate mood and affect   Data Reviewed: I have personally reviewed following labs and imaging studies  CBC: Recent Labs  Lab 07/02/20 0417 07/03/20 0511 07/04/20 0238 07/05/20 0709 07/06/20 0228  WBC 7.9 6.3 4.5 3.8* 4.5  NEUTROABS 5.7 4.6 2.5  --   --   HGB 10.5* 9.6* 9.9* 10.2* 10.7*  HCT 31.3* 29.1* 29.3* 29.0* 30.2*  MCV 100.3* 100.7* 98.3 97.6 96.2  PLT 176 209 257 294 [id number]   Basic Metabolic Panel: Recent Labs  Lab 07/03/20 0511 07/04/20 0238 07/05/20 0709 07/06/20 0228 07/07/20 0342  NA 139 138 140 137 139  K 3.3* 3.3* 3.6 3.5 3.9  CL 106 105 105 103 103  CO2 21* 23 25 24 25   GLUCOSE 122* 118* 99 98 90  BUN 22 18 11 10 12   CREATININE 0.93 0.78 0.84 0.84 0.93  CALCIUM 8.2* 8.1* 8.3* 8.6* 8.7*  MG  --  1.9  --   --   --    GFR: Estimated Creatinine Clearance: 70.8 mL/min (by C-G formula based on SCr of 0.93 mg/dL). Liver Function Tests: No results for input(s): AST, ALT, ALKPHOS, BILITOT, PROT, ALBUMIN in the last 168 hours. No results for input(s): LIPASE, AMYLASE in the last 168 hours. No results for input(s): AMMONIA in the last 168 hours. Coagulation Profile: No results for input(s): INR, PROTIME in the last 168 hours. Cardiac Enzymes: No results for input(s): CKTOTAL, CKMB, CKMBINDEX, TROPONINI in the last 168 hours. BNP (last 3 results) No results for input(s): PROBNP in the last 8760 hours. HbA1C: No results for input(s): HGBA1C in the last 72 hours. CBG: No results for input(s): GLUCAP in the last 168 hours. Lipid Profile: No results for input(s):  CHOL, HDL, LDLCALC, TRIG, CHOLHDL, LDLDIRECT in the last 72 hours. Thyroid Function Tests: No results for input(s): TSH, T4TOTAL, FREET4, T3FREE, THYROIDAB in the last 72 hours. Anemia Panel: No results for input(s): VITAMINB12, FOLATE, FERRITIN, TIBC, IRON, RETICCTPCT in the last 72 hours. Urine  analysis:    Component Value Date/Time   COLORURINE YELLOW 07/04/2020 1219   APPEARANCEUR CLEAR 07/04/2020 1219   LABSPEC 1.016 07/04/2020 1219   PHURINE 5.0 07/04/2020 1219   GLUCOSEU NEGATIVE 07/04/2020 1219   HGBUR MODERATE (A) 07/04/2020 1219   BILIRUBINUR NEGATIVE 07/04/2020 1219   KETONESUR NEGATIVE 07/04/2020 1219   PROTEINUR NEGATIVE 07/04/2020 1219   NITRITE NEGATIVE 07/04/2020 1219   LEUKOCYTESUR NEGATIVE 07/04/2020 1219   Sepsis Labs: @LABRCNTIP (procalcitonin:4,lacticidven:4)  ) Recent Results (from the past 240 hour(s))  Blood Culture (routine x 2)     Status: None   Collection Time: 06/30/20  8:31 AM   Specimen: BLOOD LEFT ARM  Result Value Ref Range Status   Specimen Description   Final    BLOOD LEFT ARM Performed at [facility], [address] [place], [place] [address]    Special Requests   Final    BOTTLES DRAWN AEROBIC AND ANAEROBIC Blood Culture adequate volume Performed at [facility], [address] [place], [place] [address]    Culture   Final    NO GROWTH 5 DAYS Performed at [facility], [place] [address], [place], [place] [address]    Report Status 07/05/2020 FINAL  Final  Urine culture     Status: None   Collection Time: 06/30/20  8:32 AM   Specimen: In/Out Cath Urine  Result Value Ref Range Status   Specimen Description   Final    IN/OUT CATH URINE Performed at [facility], [address] [place], [place] [address]    Special Requests   Final    NONE Performed at [facility], [address] [place], [place] [address]    Culture   Final    NO GROWTH Performed at [facility],  [place] [address] [place], [place] [address]    Report Status 07/01/2020 FINAL  Final  Blood Culture (routine x 2)     Status: None   Collection Time: 06/30/20  9:00 AM   Specimen: BLOOD LEFT HAND  Result Value Ref Range Status   Specimen Description   Final    BLOOD LEFT HAND Performed at [facility], [address] [place], [place] [address]    Special Requests   Final    BOTTLES DRAWN AEROBIC AND ANAEROBIC Blood Culture adequate volume Performed at [facility], [address] [place], [place] [address]    Culture   Final    NO GROWTH 5 DAYS Performed at [facility], [place] [address] [place], [place] [address]    Report Status 07/05/2020 FINAL  Final  Resp Panel by RT-PCR (Flu A&B, Covid) Nasopharyngeal Swab     Status: None   Collection Time: 06/30/20 10:34 AM   Specimen: Nasopharyngeal Swab; Nasopharyngeal(NP) swabs in vial transport medium  Result Value Ref Range Status   SARS Coronavirus 2 by RT PCR NEGATIVE NEGATIVE Final    Comment: (NOTE) SARS-CoV-2 target nucleic acids are NOT DETECTED.  The SARS-CoV-2 RNA is generally detectable in upper respiratory specimens during the acute phase of infection. The lowest concentration of SARS-CoV-2 viral copies this assay can detect is 138 copies/mL. A negative result does not preclude SARS-Cov-2 infection and should not be used as the sole basis for treatment or other patient management decisions. A negative result may occur with  improper specimen collection/handling, submission of specimen other than nasopharyngeal swab, presence of  viral mutation(s) within the areas targeted by this assay, and inadequate number of viral copies(<138 copies/mL). A negative result must be combined with clinical observations, patient history, and epidemiological information. The expected result is Negative.  Fact Sheet for Patients:  EntrepreneurPulse.com.au  Fact Sheet for Healthcare Providers:   IncredibleEmployment.be  This test is no t yet approved or cleared by the [place] FDA and  has been authorized for detection and/or diagnosis of SARS-CoV-2 by FDA under an Emergency Use Authorization (EUA). This EUA will remain  in effect (meaning this test can be used) for the duration of the COVID-19 declaration under Section 564(b)(1) of the Act, 21 U.S.C.section 360bbb-3(b)(1), unless the authorization is terminated  or revoked sooner.       Influenza A by PCR NEGATIVE NEGATIVE Final   Influenza B by PCR NEGATIVE NEGATIVE Final    Comment: (NOTE) The Xpert Xpress SARS-CoV-2/FLU/RSV plus assay is intended as an aid in the diagnosis of influenza from Nasopharyngeal swab specimens and should not be used as a sole basis for treatment. Nasal washings and aspirates are unacceptable for Xpert Xpress SARS-CoV-2/FLU/RSV testing.  Fact Sheet for Patients: EntrepreneurPulse.com.au  Fact Sheet for Healthcare Providers: IncredibleEmployment.be  This test is not yet approved or cleared by the [place] FDA and has been authorized for detection and/or diagnosis of SARS-CoV-2 by FDA under an Emergency Use Authorization (EUA). This EUA will remain in effect (meaning this test can be used) for the duration of the COVID-19 declaration under Section 564(b)(1) of the Act, 21 U.S.C. section 360bbb-3(b)(1), unless the authorization is terminated or revoked.  Performed at [facility], [address] [place], [place] [address]   CSF culture     Status: None   Collection Time: 07/03/20  4:10 PM   Specimen: PATH Cytology CSF; Cerebrospinal Fluid  Result Value Ref Range Status   Specimen Description CSF  Final   Special Requests NONE  Final   Gram Stain   Final    WBC PRESENT, PREDOMINANTLY MONONUCLEAR NO ORGANISMS SEEN CYTOSPIN SMEAR    Culture   Final    NO GROWTH 3 DAYS Performed at [facility],  [place] [address] [place], [place] [address]    Report Status 07/07/2020 FINAL  Final      Radiology Studies: No results found.   Scheduled Meds: . aspirin EC  81 mg Oral Daily  . Chlorhexidine Gluconate Cloth  6 each Topical Daily  . [START ON 07/12/2020] cyanocobalamin  1,000 mcg Intramuscular Q Thu   Followed by  . [START ON 08/30/2020] cyanocobalamin  1,000 mcg Intramuscular Q28 days  . lidocaine  1 application Urethral Once  . naproxen  500 mg Oral BID WC  . OLANZapine  2.5 mg Oral Daily  . OLANZapine zydis  2.5 mg Oral QHS  . pantoprazole (PROTONIX) IV  40 mg Intravenous Q12H  . pneumococcal 23 valent vaccine  0.5 mL Intramuscular Tomorrow-1000  . polyethylene glycol  17 g Oral BID  . rosuvastatin  20 mg Oral QHS  . senna-docusate  1 tablet Oral BID  . tamsulosin  0.4 mg Oral Daily  . [START ON 07/10/2020] thiamine injection  100 mg Intravenous Daily   Continuous Infusions: . sodium chloride 10 mL/hr at 07/03/20 0534  . thiamine injection 250 mg (07/07/20 1146)     LOS: 7 days   Time Spent in minutes   30 minutes  Savera Donson D.O. on 07/08/2020 at 10:33 AM  Between 7am to  7pm - Please see pager noted on [email or web address]  After 7pm go to [email or web address]  And look for the night coverage person covering for me after hours  [facility] Office  [phone]

## 2020-07-08 NOTE — Progress Notes (Signed)
Patient has not voided since foley cath. Removal at 1300; bladder scan reflects an empty bladder; 43ml; several scans preformed; nondistended; nontender; states, "I don't need to go right now".  Patient reports no bowel movement in several days and confirmed by family. Bowel medications given on MAR.  Reported to 7p-7a RN; patient has not voided; continue to monitor.

## 2020-07-09 ENCOUNTER — Ambulatory Visit: Payer: Medicare HMO | Admitting: Neurology

## 2020-07-09 DIAGNOSIS — R509 Fever, unspecified: Secondary | ICD-10-CM | POA: Diagnosis not present

## 2020-07-09 DIAGNOSIS — I10 Essential (primary) hypertension: Secondary | ICD-10-CM | POA: Diagnosis not present

## 2020-07-09 DIAGNOSIS — R338 Other retention of urine: Secondary | ICD-10-CM | POA: Diagnosis not present

## 2020-07-09 LAB — BASIC METABOLIC PANEL
Anion gap: 7 (ref 5–15)
BUN: 16 mg/dL (ref 8–23)
CO2: 26 mmol/L (ref 22–32)
Calcium: 9 mg/dL (ref 8.9–10.3)
Chloride: 108 mmol/L (ref 98–111)
Creatinine, Ser: 1.03 mg/dL (ref 0.61–1.24)
GFR, Estimated: >60 mL/min (ref >60)
Glucose, Bld: 122 mg/dL — ABNORMAL HIGH (ref 70–99)
Potassium: 4 mmol/L (ref 3.5–5.1)
Sodium: 141 mmol/L (ref 135–145)

## 2020-07-09 MED ORDER — LACTULOSE 10 GM/15ML PO SOLN: 20.0000 g | Freq: Once | ORAL | Status: DC

## 2020-07-09 MED ORDER — BISACODYL 10 MG RE SUPP
10.0000 mg | Freq: Every day | RECTAL | Status: DC | PRN
  Filled 2020-07-09: qty 1

## 2020-07-09 NOTE — Progress Notes (Addendum)
PROGRESS NOTE    Patrick Macias  OEV:[id number] DOB: [birth year] DOA: 06/30/2020 PCP: Crist Infante, MD   Brief Narrative:  HPI on 06/30/2020 by Dr. Atha Starks Patrick Macias is a 80 y.o. male with hx of vascular dementia, BPH, dyslipidemia, hypertension, who presented to the facility on 06/30/2020 secondary to altered mental status, generalized weakness, fever.  At the time of exam, patient is cephalopathic, thus a complete and thorough HPI review systems unable to be fully obtained.  Information obtained from chart and daughter and wife bedside.  Apparently on Monday, patient developed hives on different parts of his body that on his body but was primarily his back, not in 1 dermatome.  He tried to take Zyrtec with no improvement of his rash.  He had been sleeping well due to the rash.  Then apparently yesterday he hit his hand on something but family does not know what.  Then the morning presentation, patient had a fever, and wife found him in bed significantly altered, encephalopathic, with inability to get out of bed.  He had abnormal speech, shaking, and was not a self and thus EMS was contacted to bring him.  Of note, patient was recently hospitalized in the beginning of December 2021 for encephalopathy.  MRI did not show a CVA but showed findings concerning with chronic microvascular ischemia.  Neurology was consulted and evaluated patient.  Per family there was discussion that he has vascular dementia.  He was scheduled to follow-up with neurology outpatient.  Psychogenic drugs he is on are Lexapro, and galantamine.  Interim history Admitted with AMS and fever. Neurology consulted. So far no infectious etiology found. S/p LP, unremarkable. Neurology feels this could be his dementia.  Also noted to have urinary retention, Foley catheter to be placed 07/04/20. Assessment & Plan   Acute metabolic encephalopathy/Acute delirium with underlying vascular dementia -Patient currently alert and  oriented x2 -Patient presented with fever and was placed empirically on IV vancomycin, cefepime and acyclovir -Culture data has remained negative to date -MRI brain showed no acute finding -EEG showed no signs of seizure.  Moderate diffuse encephalopathy nonspecific etiology. -Neurology consulted and appreciated, status post lumbar puncture -Discussed with neurology 07/04/20, Dr. Lorrin Goodell, recommended starting Seroquel at bedtime.  Patient was placed on 25 mg of Seroquel. -Neurology also recommended follow-up as an outpatient given some laboratory studies on the lumbar puncture will take several weeks to result. -Wife tells me that patient was doing well up until he recently had a TIA and thereafter had a gout flare. -Psychiatry consulted and appreciated. Seroquel and Ativan were discontinued, Zyprexa added 2.5 mg daily as well as 2.5 mg nightly.  -However patient's family felt that Zyprexa was causing more agitation.  -Minimizing the use of IV morphine.  Tramadol has been added for pain management -Continue B12 supplemenation.  -Patient more alert and awake this morning, able to converse.  Continues to be confused and not at baseline. -have placed patient on Seroquel 25mg  at 8am and 4pm, along with 50mg  QHS -discussed possibility of geripsych with family and TOC -will continue to try to reorient patient and monitor on new regimen  Fever of unknown origin -Chest x-ray shows no pneumonia -UA unremarkable for infection -Blood cultures show no growth to date -Patient was empirically started on acyclovir, vancomycin, and cefepime, which have now been discontinued -Question if this is due to underlying rheumatological issue -Patient does have a history of gout  -has been afebrile for several days, will continue to  monitor  Polyarticular swelling and erythema -At this time no concern for infection given the imaging shows no effusions -Started on naproxen and PPI for gastric prophylaxis -Added  tramadol for pain -PT and OT recommending home health  Essential hypertension -Allowing for permissive hypertension given question of TIA although work-up for TIA does seem unremarkable at this time  Hypokalemia -Resolved with replacement, currently 4 -Magnesium 1.9 -Continue to monitor and replace as needed  Acute urinary retention -Patient has had several in and out catheterizations he continues to have urinary retention -Possibly secondary to acute illness versus underlying prostate issues -Foley catheter was placed on 07/04/2020 -Continue Flomax -Urology consulted and appreciated and recommended continuing Foley catheter until acute issues have resolved.  May proceed with voiding trial as an inpatient however if unable to successfully pass, will follow up with outpatient urologist Dr. Terie Purser or patient may follow-up with [facility] for outpatient voiding trial. -foley catheter removed on 07/08/20, will continue to monitor closely  Possible pneumonitis -Overnight 1/5-1/6, patient developed wheezing and other abnormal breath sounds as per family -CXR obtained and showed interstitial edema and/or pneumonitis -Discontinued IVF  -Patient was given dose of Lasix as well as nebulizer treatment -Improved aeration -Currently with no leukocytosis and afebrile  Constipation -Continue Senokot -Will add on suppository as needed  DVT Prophylaxis  SCDs  Code Status: Full  Family Communication: Wife at bedside.  Disposition Plan:  Status is: Inpatient  Remains inpatient appropriate because:Inpatient level of care appropriate due to severity of illness   Dispo: The patient is from: Home              Anticipated d/c is to: Home wit home health              Anticipated d/c date is: > 3 days              Patient currently is not medically stable to d/c.  Consultants Neurology Urology Psychiatry   Procedures  Lumbar puncture  Antibiotics   Anti-infectives (From admission,  onward)   Start     Dose/Rate Route Frequency Ordered Stop   07/02/20 1400  acyclovir (ZOVIRAX) 800 mg in dextrose 5 % 150 mL IVPB  Status:  Discontinued        800 mg 166 mL/hr over 60 Minutes Intravenous Every 8 hours 07/02/20 0913 07/03/20 1841   07/01/20 1445  vancomycin (VANCOREADY) IVPB 750 mg/150 mL  Status:  Discontinued        750 mg 150 mL/hr over 60 Minutes Intravenous Every 12 hours 07/01/20 1354 07/03/20 1842   07/01/20 1445  acyclovir (ZOVIRAX) 800 mg in dextrose 5 % 150 mL IVPB  Status:  Discontinued        800 mg 166 mL/hr over 60 Minutes Intravenous Every 8 hours 07/01/20 1354 07/02/20 0825   07/01/20 1000  ceFEPIme (MAXIPIME) 2 g in sodium chloride 0.9 % 100 mL IVPB  Status:  Discontinued        2 g 200 mL/hr over 30 Minutes Intravenous Every 12 hours 06/30/20 2357 07/03/20 1842   06/30/20 2300  vancomycin (VANCOREADY) IVPB 750 mg/150 mL  Status:  Discontinued        750 mg 150 mL/hr over 60 Minutes Intravenous Every 12 hours 06/30/20 1147 06/30/20 2357   06/30/20 2200  ceFEPIme (MAXIPIME) 2 g in sodium chloride 0.9 % 100 mL IVPB  Status:  Discontinued        2 g 200 mL/hr over 30 Minutes Intravenous Every  12 hours 06/30/20 1147 06/30/20 2357   06/30/20 1400  metroNIDAZOLE (FLAGYL) tablet 500 mg  Status:  Discontinued        500 mg Oral Every 8 hours 06/30/20 1137 06/30/20 2357   06/30/20 1145  ceFEPIme (MAXIPIME) 2 g in sodium chloride 0.9 % 100 mL IVPB        2 g 200 mL/hr over 30 Minutes Intravenous  Once 06/30/20 1137 06/30/20 1230   06/30/20 1145  vancomycin (VANCOCIN) IVPB 1000 mg/200 mL premix        1,000 mg 200 mL/hr over 60 Minutes Intravenous  Once 06/30/20 1137 06/30/20 1337      Subjective:   Kathryne Hitch seen and examined today.  Patient alert and speaking this morning. Wife at bedside.  Objective:   Vitals:   07/08/20 1827 07/08/20 1947 07/09/20 0014 07/09/20 0436  BP: 129/73 125/61 (!) 160/75 (!) 154/55  Pulse: 87 71 64 86  Resp: 20 18 20 20    Temp: 98.3 F (36.8 C) 98.3 F (36.8 C) 97.6 F (36.4 C) 98.5 F (36.9 C)  TempSrc: Oral Oral Oral Oral  SpO2: 99% 97% 98% 97%  Weight:      Height:        Intake/Output Summary (Last 24 hours) at 07/09/2020 1359 Last data filed at 07/09/2020 7062 Gross per 24 hour  Intake --  Output 600 ml  Net -600 ml   Filed Weights   07/04/20 0400 07/06/20 0600 07/08/20 0310  Weight: 83.4 kg 81.3 kg 80.2 kg   Exam  General: Well developed, chronically ill-appearing, NAD  HEENT: NCAT, mucous membranes moist.   Cardiovascular: S1 S2 auscultated, RRR  Respiratory: Diminished breath sounds  Abdomen: Soft, nontender, nondistended, + bowel sounds  Extremities: warm dry without cyanosis clubbing or edema  Neuro: AAOx1 (self), nonfocal  Psych: Appropriate mood and affect   Data Reviewed: I have personally reviewed following labs and imaging studies  CBC: Recent Labs  Lab 07/03/20 0511 07/04/20 0238 07/05/20 0709 07/06/20 0228  WBC 6.3 4.5 3.8* 4.5  NEUTROABS 4.6 2.5  --   --   HGB 9.6* 9.9* 10.2* 10.7*  HCT 29.1* 29.3* 29.0* 30.2*  MCV 100.7* 98.3 97.6 96.2  PLT 209 257 294 376   Basic Metabolic Panel: Recent Labs  Lab 07/04/20 0238 07/05/20 0709 07/06/20 0228 07/07/20 0342 07/09/20 0051  NA 138 140 137 139 141  K 3.3* 3.6 3.5 3.9 4.0  CL 105 105 103 103 108  CO2 23 25 24 25 26   GLUCOSE 118* 99 98 90 122*  BUN 18 11 10 12 16   CREATININE 0.78 0.84 0.84 0.93 1.03  CALCIUM 8.1* 8.3* 8.6* 8.7* 9.0  MG 1.9  --   --   --   --    GFR: Estimated Creatinine Clearance: 64 mL/min (by C-G formula based on SCr of 1.03 mg/dL). Liver Function Tests: No results for input(s): AST, ALT, ALKPHOS, BILITOT, PROT, ALBUMIN in the last 168 hours. No results for input(s): LIPASE, AMYLASE in the last 168 hours. No results for input(s): AMMONIA in the last 168 hours. Coagulation Profile: No results for input(s): INR, PROTIME in the last 168 hours. Cardiac Enzymes: No results for  input(s): CKTOTAL, CKMB, CKMBINDEX, TROPONINI in the last 168 hours. BNP (last 3 results) No results for input(s): PROBNP in the last 8760 hours. HbA1C: No results for input(s): HGBA1C in the last 72 hours. CBG: No results for input(s): GLUCAP in the last 168 hours. Lipid Profile: No results  for input(s): CHOL, HDL, LDLCALC, TRIG, CHOLHDL, LDLDIRECT in the last 72 hours. Thyroid Function Tests: No results for input(s): TSH, T4TOTAL, FREET4, T3FREE, THYROIDAB in the last 72 hours. Anemia Panel: No results for input(s): VITAMINB12, FOLATE, FERRITIN, TIBC, IRON, RETICCTPCT in the last 72 hours. Urine analysis:    Component Value Date/Time   COLORURINE YELLOW 07/04/2020 [place] 07/04/2020 1219   LABSPEC 1.016 07/04/2020 1219   PHURINE 5.0 07/04/2020 1219   GLUCOSEU NEGATIVE 07/04/2020 1219   HGBUR MODERATE (A) 07/04/2020 1219   BILIRUBINUR NEGATIVE 07/04/2020 1219   KETONESUR NEGATIVE 07/04/2020 1219   PROTEINUR NEGATIVE 07/04/2020 1219   NITRITE NEGATIVE 07/04/2020 1219   LEUKOCYTESUR NEGATIVE 07/04/2020 1219   Sepsis Labs: @LABRCNTIP (procalcitonin:4,lacticidven:4)  ) Recent Results (from the past 240 hour(s))  Blood Culture (routine x 2)     Status: None   Collection Time: 06/30/20  8:31 AM   Specimen: BLOOD LEFT ARM  Result Value Ref Range Status   Specimen Description   Final    BLOOD LEFT ARM Performed at [facility], [address] [place], [place] [address]    Special Requests   Final    BOTTLES DRAWN AEROBIC AND ANAEROBIC Blood Culture adequate volume Performed at [facility], [address] [place], [place] [address]    Culture   Final    NO GROWTH 5 DAYS Performed at [facility], [place] [address], [place], [place] [address]    Report Status 07/05/2020 FINAL  Final  Urine culture     Status: None   Collection Time: 06/30/20  8:32 AM   Specimen: In/Out Cath Urine  Result Value Ref Range Status   Specimen  Description   Final    IN/OUT CATH URINE Performed at [facility], [address] [place], [place] [address]    Special Requests   Final    NONE Performed at [facility], [address] [place], [place] [address]    Culture   Final    NO GROWTH Performed at [facility], [place] [address] [place], [place] [address]    Report Status 07/01/2020 FINAL  Final  Blood Culture (routine x 2)     Status: None   Collection Time: 06/30/20  9:00 AM   Specimen: BLOOD LEFT HAND  Result Value Ref Range Status   Specimen Description   Final    BLOOD LEFT HAND Performed at [facility], [address] [place], [place] [address]    Special Requests   Final    BOTTLES DRAWN AEROBIC AND ANAEROBIC Blood Culture adequate volume Performed at [facility], [address] [place], [place] [address]    Culture   Final    NO GROWTH 5 DAYS Performed at [facility], [place] [address], [place], [place] [address]    Report Status 07/05/2020 FINAL  Final  Resp Panel by RT-PCR (Flu A&B, Covid) Nasopharyngeal Swab     Status: None   Collection Time: 06/30/20 10:34 AM   Specimen: Nasopharyngeal Swab; Nasopharyngeal(NP) swabs in vial transport medium  Result Value Ref Range Status   SARS Coronavirus 2 by RT PCR NEGATIVE NEGATIVE Final    Comment: (NOTE) SARS-CoV-2 target nucleic acids are NOT DETECTED.  The SARS-CoV-2 RNA is generally detectable in upper respiratory specimens during the acute phase of infection. The lowest concentration of SARS-CoV-2 viral copies this assay  can detect is 138 copies/mL. A negative result does not preclude SARS-Cov-2 infection and should not be used as the sole basis for treatment or other patient management decisions. A negative result may occur with  improper specimen collection/handling, submission of specimen other than nasopharyngeal swab, presence of viral mutation(s) within the areas targeted by this assay, and  inadequate number of viral copies(<138 copies/mL). A negative result must be combined with clinical observations, patient history, and epidemiological information. The expected result is Negative.  Fact Sheet for Patients:  BloggerCourse.comhttps://www.fda.gov/media/152166/download  Fact Sheet for Healthcare Providers:  SeriousBroker.ithttps://www.fda.gov/media/152162/download  This test is no t yet approved or cleared by the [place] FDA and  has been authorized for detection and/or diagnosis of SARS-CoV-2 by FDA under an Emergency Use Authorization (EUA). This EUA will remain  in effect (meaning this test can be used) for the duration of the COVID-19 declaration under Section 564(b)(1) of the Act, 21 U.S.C.section 360bbb-3(b)(1), unless the authorization is terminated  or revoked sooner.       Influenza A by PCR NEGATIVE NEGATIVE Final   Influenza B by PCR NEGATIVE NEGATIVE Final    Comment: (NOTE) The Xpert Xpress SARS-CoV-2/FLU/RSV plus assay is intended as an aid in the diagnosis of influenza from Nasopharyngeal swab specimens and should not be used as a sole basis for treatment. Nasal washings and aspirates are unacceptable for Xpert Xpress SARS-CoV-2/FLU/RSV testing.  Fact Sheet for Patients: BloggerCourse.comhttps://www.fda.gov/media/152166/download  Fact Sheet for Healthcare Providers: SeriousBroker.ithttps://www.fda.gov/media/152162/download  This test is not yet approved or cleared by the [place] FDA and has been authorized for detection and/or diagnosis of SARS-CoV-2 by FDA under an Emergency Use Authorization (EUA). This EUA will remain in effect (meaning this test can be used) for the duration of the COVID-19 declaration under Section 564(b)(1) of the Act, 21 U.S.C. section 360bbb-3(b)(1), unless the authorization is terminated or revoked.  Performed at [facility], [address] [place], [place] [address]   CSF culture     Status: None   Collection Time: 07/03/20  4:10 PM   Specimen: PATH  Cytology CSF; Cerebrospinal Fluid  Result Value Ref Range Status   Specimen Description CSF  Final   Special Requests NONE  Final   Gram Stain   Final    WBC PRESENT, PREDOMINANTLY MONONUCLEAR NO ORGANISMS SEEN CYTOSPIN SMEAR    Culture   Final    NO GROWTH 3 DAYS Performed at [facility], [address] [place], [place] [address]    Report Status 07/07/2020 FINAL  Final      Radiology Studies: No results found.   Scheduled Meds: . aspirin EC  81 mg Oral Daily  . Chlorhexidine Gluconate Cloth  6 each Topical Daily  . [START ON 07/12/2020] cyanocobalamin  1,000 mcg Intramuscular Q Thu   Followed by  . [START ON 08/30/2020] cyanocobalamin  1,000 mcg Intramuscular Q28 days  . lidocaine  1 application Urethral Once  . naproxen  500 mg Oral BID WC  . pantoprazole  40 mg Oral BID  . pneumococcal 23 valent vaccine  0.5 mL Intramuscular Tomorrow-1000  . polyethylene glycol  17 g Oral BID  . QUEtiapine  25 mg Oral BID  . QUEtiapine  50 mg Oral QHS  . rosuvastatin  20 mg Oral QHS  . senna-docusate  1 tablet Oral BID  . tamsulosin  0.4 mg Oral Daily  . [START ON 07/10/2020] thiamine injection  100 mg Intravenous Daily   Continuous Infusions: .  sodium chloride 10 mL/hr at 07/03/20 0534  . thiamine injection 250 mg (07/07/20 1146)     LOS: 8 days   Time Spent in minutes   45 minutes (greater than 50% of time spent with patient face to face as well as speaking to family, as well as reviewing records, discussing with consultant, and formulating a plan)   Cristal Ford D.O. on 07/09/2020 at 1:59 PM  Between 7am to 7pm - Please see pager noted on [email or web address]  After 7pm go to [email or web address]  And look for the night coverage person covering for me after hours  [facility] Office  [phone]

## 2020-07-09 NOTE — Progress Notes (Signed)
Physical Therapy Treatment Patient Details Name: Patrick Macias MRN: [id number] DOB: [birth year] Today's Date: 07/09/2020    History of Present Illness Pt adm with AMS, fever of unknown origin and rash/hives. Brain MRI negative for acute abnormality. ? gout.  PMH - dementia, HTN, ?TIA.    PT Comments    Today's skilled session focused on mobility progression. Limited as pt became more and more agitated while sitting edge of bed, eventually having PTA return him to supine. Pt pushing PTA away and grabbing at hands while PTA was placing gown on pt, gait belt and attempting to assist pt for safety. At end of session once pt was settled in supine with restraints reapplied he was calm and talking about the paper with sitter in the room. Acute PT to continue during pt's hospital stay.     Follow Up Recommendations  Home health PT;Supervision/Assistance - 24 hour     Equipment Recommendations  3in1 (PT);Wheelchair (measurements PT);Wheelchair cushion (measurements PT);Rolling walker with 5" wheels    Precautions / Restrictions Precautions Precautions: Fall Precaution Comments: impulsive and can get agitated    Mobility  Bed Mobility Overal bed mobility: Needs Assistance Bed Mobility: Supine to Sit;Sit to Supine     Supine to sit: Mod assist;HOB elevated Sit to supine: Mod assist   General bed mobility comments: cues for pt to advance LE's off edge of bed with assist. Pt bringing them back onto the bed. attempted a few times with different variations of cueing provided at the trunk or legs, each time pt lying back down and/or bringing legs back on bed away from the edge. on last attempt pt needed mod assist at trunk and legs together to initate sitting up and complete the task.  Transfers Overall transfer level: Needs assistance Equipment used: Rolling walker (2 wheeled);1 person hand held assist Transfers: Sit to/from Stand Sit to Stand: Mod assist         General transfer  comment: 1st stand bed to RW with pt not following cues for where to place hand on walker (placing right hand on bar below handle and then trying to lift the walker up. mod/max assist for balance at this time then to get pt to sit back down. Performed 2 more stands with mod assist with pt holding onto PTA, cues on posture with standing. min/mod assist for balance with pt reaching for random objects in room at times, one time trying to reach to the floor. on the 3rd stand with mod assist had pt take 3 side steps toward HOB.      Cognition Arousal/Alertness: Awake/alert Behavior During Therapy: Agitated;Impulsive Overall Cognitive Status: Impaired/Different from baseline Area of Impairment: Following commands;Orientation;Attention;Safety/judgement;Awareness;Problem solving;Memory                 Orientation Level: Disoriented to;Time;Situation;Place;Person     Following Commands: Follows one step commands with increased time;Follows one step commands inconsistently Safety/Judgement: Decreased awareness of safety;Decreased awareness of deficits Awareness: Intellectual Problem Solving: Difficulty sequencing;Decreased initiation;Requires tactile cues;Requires verbal cues;Slow processing General Comments: pt initially agreeable to therapy, oob and walking. became agitated and mildly combative as session/mobility progressed. Unable to be redirected at this time. distractable both internally and externally. left with all restraints re-donned and sitter at bedside. Pt calm in bed upon leaving talking with sitter about paper.       Pertinent Vitals/Pain Pain Assessment: No/denies pain     PT Goals (current goals can now be found in the care plan section) Acute Rehab PT  Goals Patient Stated Goal: pt did not explicitly state but family desired for pt to improve PT Goal Formulation: With patient/family Time For Goal Achievement: 07/15/20 Potential to Achieve Goals: Good Progress towards PT  goals: Progressing toward goals    Frequency    Min 3X/week      PT Plan Current plan remains appropriate    AM-PAC PT "6 Clicks" Mobility   Outcome Measure  Help needed turning from your back to your side while in a flat bed without using bedrails?: A Lot Help needed moving from lying on your back to sitting on the side of a flat bed without using bedrails?: A Lot Help needed moving to and from a bed to a chair (including a wheelchair)?: A Lot Help needed standing up from a chair using your arms (e.g., wheelchair or bedside chair)?: A Lot Help needed to walk in hospital room?: A Lot Help needed climbing 3-5 steps with a railing? : Total 6 Click Score: 11    End of Session Equipment Utilized During Treatment: Gait belt Activity Tolerance: Treatment limited secondary to agitation Patient left: in bed;with call bell/phone within reach;with bed alarm set;with restraints reapplied;with nursing/sitter in room Nurse Communication: Mobility status PT Visit Diagnosis: Unsteadiness on feet (R26.81);Other abnormalities of gait and mobility (R26.89);Muscle weakness (generalized) (M62.81);Difficulty in walking, not elsewhere classified (R26.2)     Time: 4098-1191 PT Time Calculation (min) (ACUTE ONLY): 27 min  Charges:  $Therapeutic Activity: 23-37 mins                    Willow Ora, PTA, [facility]hab Services [phone] 07/09/20, 12:38 PM   Willow Ora 07/09/2020, 12:35 PM

## 2020-07-10 ENCOUNTER — Ambulatory Visit: Payer: Medicare HMO | Admitting: Neurology

## 2020-07-10 LAB — COMPREHENSIVE METABOLIC PANEL
ALT: 35 U/L (ref 0–44)
AST: 46 U/L — ABNORMAL HIGH (ref 15–41)
Albumin: 3.7 g/dL (ref 3.5–5.0)
Alkaline Phosphatase: 45 U/L (ref 38–126)
Anion gap: 14 (ref 5–15)
BUN: 19 mg/dL (ref 8–23)
CO2: 20 mmol/L — ABNORMAL LOW (ref 22–32)
Calcium: 9.2 mg/dL (ref 8.9–10.3)
Chloride: 107 mmol/L (ref 98–111)
Creatinine, Ser: 1.12 mg/dL (ref 0.61–1.24)
GFR, Estimated: >60 mL/min (ref >60)
Glucose, Bld: 116 mg/dL — ABNORMAL HIGH (ref 70–99)
Potassium: 4.2 mmol/L (ref 3.5–5.1)
Sodium: 141 mmol/L (ref 135–145)
Total Bilirubin: 0.8 mg/dL (ref 0.3–1.2)
Total Protein: 7.2 g/dL (ref 6.5–8.1)

## 2020-07-10 LAB — AMMONIA: Ammonia: 16 umol/L (ref 9–35)

## 2020-07-10 LAB — HEMOGLOBIN AND HEMATOCRIT, BLOOD
HCT: 35.4 % — ABNORMAL LOW (ref 39.0–52.0)
Hemoglobin: 11.7 g/dL — ABNORMAL LOW (ref 13.0–17.0)

## 2020-07-10 LAB — MISC LABCORP TEST (SEND OUT)

## 2020-07-10 LAB — ENTEROVIRUS PCR: Enterovirus PCR: NEGATIVE

## 2020-07-10 MED ORDER — HALOPERIDOL LACTATE 5 MG/ML IJ SOLN
2.0000 mg | Freq: Once | INTRAMUSCULAR | Status: AC | PRN
  Administered 2020-07-10: 2 mg via INTRAVENOUS

## 2020-07-10 MED ORDER — HALOPERIDOL LACTATE 5 MG/ML IJ SOLN
INTRAMUSCULAR | Status: AC
  Filled 2020-07-10: qty 1

## 2020-07-10 MED ORDER — HALOPERIDOL LACTATE 5 MG/ML IJ SOLN: 1.0000 mg | Freq: Three times a day (TID) | INTRAMUSCULAR | Status: DC | PRN

## 2020-07-10 MED ORDER — THIAMINE HCL 100 MG PO TABS
100.0000 mg | ORAL_TABLET | Freq: Every day | ORAL | Status: DC
  Administered 2020-07-11 – 2020-07-12 (×2): 100 mg via ORAL
  Filled 2020-07-10 (×2): qty 1

## 2020-07-10 MED ORDER — HALOPERIDOL LACTATE 5 MG/ML IJ SOLN
1.0000 mg | Freq: Three times a day (TID) | INTRAMUSCULAR | Status: DC | PRN
  Administered 2020-07-10 (×2): 1 mg via INTRAVENOUS
  Filled 2020-07-10 (×2): qty 1

## 2020-07-10 NOTE — Progress Notes (Signed)
PROGRESS NOTE    Patrick Macias  [id number] DOB: 13-Jul-1940 DOA: 06/30/2020 PCP: Patrick Infante, MD   Brief Narrative:  HPI on 06/30/2020 by Dr. Atha Macias Patrick Macias is a 80 y.o. male with hx of vascular dementia, BPH, dyslipidemia, hypertension, who presented to the facility on 06/30/2020 secondary to altered mental status, generalized weakness, fever.  At the time of exam, patient is cephalopathic, thus a complete and thorough HPI review systems unable to be fully obtained.  Information obtained from chart and daughter and wife bedside.  Apparently on Monday, patient developed hives on different parts of his body that on his body but was primarily his back, not in 1 dermatome.  He tried to take Zyrtec with no improvement of his rash.  He had been sleeping well due to the rash.  Then apparently yesterday he hit his hand on something but family does not know what.  Then the morning presentation, patient had a fever, and wife found him in bed significantly altered, encephalopathic, with inability to get out of bed.  He had abnormal speech, shaking, and was not a self and thus EMS was contacted to bring him.  Of note, patient was recently hospitalized in the beginning of December 2021 for encephalopathy.  MRI did not show a CVA but showed findings concerning with chronic microvascular ischemia.  Neurology was consulted and evaluated patient.  Per family there was discussion that he has vascular dementia.  He was scheduled to follow-up with neurology outpatient.  Psychogenic drugs he is on are Lexapro, and galantamine.  Interim history Admitted with AMS and fever. Neurology consulted. So far no infectious etiology found. S/p LP, unremarkable. Neurology feels this could be his dementia.  Also noted to have urinary retention, Foley catheter to be placed 07/04/20 and removed 1/9.  Of place patient on Seroquel.  Patient may likely need inpatient series psych if he does not improve. Assessment &  Plan   Acute metabolic encephalopathy/Acute delirium with underlying vascular dementia -Patient currently alert and oriented x2 -Patient presented with fever and was placed empirically on IV vancomycin, cefepime and acyclovir -Culture data has remained negative to date -MRI brain showed no acute finding -EEG showed no signs of seizure.  Moderate diffuse encephalopathy nonspecific etiology. -Neurology consulted and appreciated, status post lumbar puncture -Discussed with neurology 07/04/20, Dr. Lorrin Macias, recommended starting Seroquel at bedtime.  Patient was placed on 25 mg of Seroquel. -Neurology also recommended follow-up as an outpatient given some laboratory studies on the lumbar puncture will take several weeks to result. -Wife tells me that patient was doing well up until he recently had a TIA and thereafter had a gout flare. -Psychiatry consulted and appreciated. Seroquel and Ativan were discontinued, Zyprexa added 2.5 mg daily as well as 2.5 mg nightly.  -However patient's family felt that Zyprexa was causing more agitation.  -Minimizing the use of IV morphine.  Tramadol has been added for pain management -Continue B12 supplemenation.  -Patient more alert and awake this morning, able to converse.  Continues to be confused and not at baseline. -Continue Seroquel 25mg  at 8am and 4pm, along with 50mg  QHS -discussed possibility of geripsych with family and TOC- have started looking for a place -Have discussed with family and nursing- continue to try to reorient patient and monitor on new regimen  Fever of unknown origin -Chest x-ray shows no pneumonia -UA unremarkable for infection -Blood cultures show no growth to date -Patient was empirically started on acyclovir, vancomycin, and cefepime, which  have now been discontinued -Question if this is due to underlying rheumatological issue -Patient does have a history of gout  -has been afebrile for several days, will continue to  monitor  Polyarticular swelling and erythema -At this time no concern for infection given the imaging shows no effusions -Started on naproxen and PPI for gastric prophylaxis -Added tramadol for pain -PT and OT recommending home health  Essential hypertension -Allowing for permissive hypertension given question of TIA although work-up for TIA does seem unremarkable at this time  Hypokalemia -Resolved with replacement, currently 4 -Magnesium 1.9 -Continue to monitor and replace as needed  Acute urinary retention -Patient has had several in and out catheterizations he continues to have urinary retention -Possibly secondary to acute illness versus underlying prostate issues -Foley catheter was placed on 07/04/2020 -Continue Flomax -Urology consulted and appreciated and recommended continuing Foley catheter until acute issues have resolved.  May proceed with voiding trial as an inpatient however if unable to successfully pass, will follow up with outpatient urologist Dr. Terie Macias or patient may follow-up with [facility] for outpatient voiding trial. -foley catheter removed on 07/08/20, will continue to monitor closely  Possible pneumonitis -Overnight 1/5-1/6, patient developed wheezing and other abnormal breath sounds as per family -CXR obtained and showed interstitial edema and/or pneumonitis -Discontinued IVF  -Patient was given dose of Lasix as well as nebulizer treatment -Improved aeration -Currently with no leukocytosis and afebrile  Constipation -Continue Senokot -Will add on suppository as needed  Deconditioning -PT and OT evaulated patient and recommended HH -Feel that patient needs continous PT and OT during hospitalization -patient does lack awareness and needs frequent cueing  DVT Prophylaxis  SCDs  Code Status: Full  Family Communication: Wife at bedside.  Daughter via phone  Disposition Plan:  Status is: Inpatient  Remains inpatient appropriate  because:Inpatient level of care appropriate due to severity of illness   Dispo: The patient is from: Home              Anticipated d/c is to: Home wit home health versus inpatient geriatric psych              Anticipated d/c date is: > 3 days              Patient currently is not medically stable to d/c.  Consultants Neurology Urology Psychiatry   Procedures  Lumbar puncture  Antibiotics   Anti-infectives (From admission, onward)   Start     Dose/Rate Route Frequency Ordered Stop   07/02/20 1400  acyclovir (ZOVIRAX) 800 mg in dextrose 5 % 150 mL IVPB  Status:  Discontinued        800 mg 166 mL/hr over 60 Minutes Intravenous Every 8 hours 07/02/20 0913 07/03/20 1841   07/01/20 1445  vancomycin (VANCOREADY) IVPB 750 mg/150 mL  Status:  Discontinued        750 mg 150 mL/hr over 60 Minutes Intravenous Every 12 hours 07/01/20 1354 07/03/20 1842   07/01/20 1445  acyclovir (ZOVIRAX) 800 mg in dextrose 5 % 150 mL IVPB  Status:  Discontinued        800 mg 166 mL/hr over 60 Minutes Intravenous Every 8 hours 07/01/20 1354 07/02/20 0825   07/01/20 1000  ceFEPIme (MAXIPIME) 2 g in sodium chloride 0.9 % 100 mL IVPB  Status:  Discontinued        2 g 200 mL/hr over 30 Minutes Intravenous Every 12 hours 06/30/20 2357 07/03/20 1842   06/30/20 2300  vancomycin (VANCOREADY) IVPB  750 mg/150 mL  Status:  Discontinued        750 mg 150 mL/hr over 60 Minutes Intravenous Every 12 hours 06/30/20 1147 06/30/20 2357   06/30/20 2200  ceFEPIme (MAXIPIME) 2 g in sodium chloride 0.9 % 100 mL IVPB  Status:  Discontinued        2 g 200 mL/hr over 30 Minutes Intravenous Every 12 hours 06/30/20 1147 06/30/20 2357   06/30/20 1400  metroNIDAZOLE (FLAGYL) tablet 500 mg  Status:  Discontinued        500 mg Oral Every 8 hours 06/30/20 1137 06/30/20 2357   06/30/20 1145  ceFEPIme (MAXIPIME) 2 g in sodium chloride 0.9 % 100 mL IVPB        2 g 200 mL/hr over 30 Minutes Intravenous  Once 06/30/20 1137 06/30/20 1230    06/30/20 1145  vancomycin (VANCOCIN) IVPB 1000 mg/200 mL premix        1,000 mg 200 mL/hr over 60 Minutes Intravenous  Once 06/30/20 1137 06/30/20 1337      Subjective:   Kathryne Hitch seen and examined today.  Patient currently alert and oriented this morning able to tell me where he is who he is who his wife is and his children.  Denies current pain.    Objective:   Vitals:   07/09/20 1124 07/09/20 1843 07/09/20 2333 07/10/20 0839  BP: (!) 141/76 136/72 126/77 136/72  Pulse: 97 70 78 75  Resp: 18 20 (!) 21 18  Temp: 98 F (36.7 C) 98.4 F (36.9 C) 98.9 F (37.2 C) 98 F (36.7 C)  TempSrc: Oral Oral Axillary Oral  SpO2: 100% 100%  100%  Weight:      Height:        Intake/Output Summary (Last 24 hours) at 07/10/2020 1042 Last data filed at 07/10/2020 0400 Gross per 24 hour  Intake 240 ml  Output 700 ml  Net -460 ml   Filed Weights   07/04/20 0400 07/06/20 0600 07/08/20 0310  Weight: 83.4 kg 81.3 kg 80.2 kg   Exam  General: Well developed, chronically ill-appearing, NAD  HEENT: NCAT, mucous membranes moist.   Cardiovascular: S1 S2 auscultated, RRR  Respiratory: Diminished breath sounds, no wheezing.  Occasional cough.  Abdomen: Soft, nontender, nondistended, + bowel sounds  Extremities: warm dry without cyanosis clubbing or edema  Neuro: AAOx2 (self, place), nonfocal  Psych: pleasantly confused, appropriate   Data Reviewed: I have personally reviewed following labs and imaging studies  CBC: Recent Labs  Lab 07/04/20 0238 07/05/20 0709 07/06/20 0228 07/10/20 0203  WBC 4.5 3.8* 4.5  --   NEUTROABS 2.5  --   --   --   HGB 9.9* 10.2* 10.7* 11.7*  HCT 29.3* 29.0* 30.2* 35.4*  MCV 98.3 97.6 96.2  --   PLT 257 294 327  --    Basic Metabolic Panel: Recent Labs  Lab 07/04/20 0238 07/05/20 0709 07/06/20 0228 07/07/20 0342 07/09/20 0051 07/10/20 0203  NA 138 140 137 139 141 141  K 3.3* 3.6 3.5 3.9 4.0 4.2  CL 105 105 103 103 108 107  CO2 23 25  24 25 26  20*  GLUCOSE 118* 99 98 90 122* 116*  BUN 18 11 10 12 16 19   CREATININE 0.78 0.84 0.84 0.93 1.03 1.12  CALCIUM 8.1* 8.3* 8.6* 8.7* 9.0 9.2  MG 1.9  --   --   --   --   --    GFR: Estimated Creatinine Clearance: 58.8 mL/min (by C-G  formula based on SCr of 1.12 mg/dL). Liver Function Tests: Recent Labs  Lab 07/10/20 0203  AST 46*  ALT 35  ALKPHOS 45  BILITOT 0.8  PROT 7.2  ALBUMIN 3.7   No results for input(s): LIPASE, AMYLASE in the last 168 hours. Recent Labs  Lab 07/10/20 0203  AMMONIA 16   Coagulation Profile: No results for input(s): INR, PROTIME in the last 168 hours. Cardiac Enzymes: No results for input(s): CKTOTAL, CKMB, CKMBINDEX, TROPONINI in the last 168 hours. BNP (last 3 results) No results for input(s): PROBNP in the last 8760 hours. HbA1C: No results for input(s): HGBA1C in the last 72 hours. CBG: No results for input(s): GLUCAP in the last 168 hours. Lipid Profile: No results for input(s): CHOL, HDL, LDLCALC, TRIG, CHOLHDL, LDLDIRECT in the last 72 hours. Thyroid Function Tests: No results for input(s): TSH, T4TOTAL, FREET4, T3FREE, THYROIDAB in the last 72 hours. Anemia Panel: No results for input(s): VITAMINB12, FOLATE, FERRITIN, TIBC, IRON, RETICCTPCT in the last 72 hours. Urine analysis:    Component Value Date/Time   COLORURINE YELLOW 07/04/2020 Linda 07/04/2020 1219   LABSPEC 1.016 07/04/2020 1219   PHURINE 5.0 07/04/2020 1219   GLUCOSEU NEGATIVE 07/04/2020 1219   HGBUR MODERATE (A) 07/04/2020 1219   BILIRUBINUR NEGATIVE 07/04/2020 1219   KETONESUR NEGATIVE 07/04/2020 1219   PROTEINUR NEGATIVE 07/04/2020 1219   NITRITE NEGATIVE 07/04/2020 1219   LEUKOCYTESUR NEGATIVE 07/04/2020 1219   Sepsis Labs: @LABRCNTIP (procalcitonin:4,lacticidven:4)  ) Recent Results (from the past 240 hour(s))  CSF culture     Status: None   Collection Time: 07/03/20  4:10 PM   Specimen: PATH Cytology CSF; Cerebrospinal Fluid   Result Value Ref Range Status   Specimen Description CSF  Final   Special Requests NONE  Final   Gram Stain   Final    WBC PRESENT, PREDOMINANTLY MONONUCLEAR NO ORGANISMS SEEN CYTOSPIN SMEAR    Culture   Final    NO GROWTH 3 DAYS Performed at [facility], [place] [address], [place], [place] [address]    Report Status 07/07/2020 FINAL  Final      Radiology Studies: No results found.   Scheduled Meds: . aspirin EC  81 mg Oral Daily  . Chlorhexidine Gluconate Cloth  6 each Topical Daily  . [START ON 07/12/2020] cyanocobalamin  1,000 mcg Intramuscular Q Thu   Followed by  . [START ON 08/30/2020] cyanocobalamin  1,000 mcg Intramuscular Q28 days  . lidocaine  1 application Urethral Once  . naproxen  500 mg Oral BID WC  . pantoprazole  40 mg Oral BID  . pneumococcal 23 valent vaccine  0.5 mL Intramuscular Tomorrow-1000  . polyethylene glycol  17 g Oral BID  . QUEtiapine  25 mg Oral BID  . QUEtiapine  50 mg Oral QHS  . rosuvastatin  20 mg Oral QHS  . senna-docusate  1 tablet Oral BID  . tamsulosin  0.4 mg Oral Daily  . [START ON 07/11/2020] thiamine  100 mg Oral Daily   Continuous Infusions: . sodium chloride 10 mL/hr at 07/03/20 0534     LOS: 9 days   Time Spent in minutes   45 minutes (greater than 50% of time spent with patient face to face as well as speaking to family, as well as reviewing records, discussing with consultant, and formulating a plan)   Cristal Ford D.O. on 07/10/2020 at 10:42 AM  Between 7am to 7pm - Please see pager noted on [email or web address]  After 7pm go to [email or web address]  And look for the night coverage person covering for me after hours  [facility] Office  [phone]

## 2020-07-10 NOTE — Progress Notes (Signed)
Occupational Therapy Treatment Patient Details Name: Patrick Macias MRN: [id number] DOB: 10/06/1940 Today's Date: 07/10/2020    History of present illness Pt adm with AMS, fever of unknown origin and rash/hives. Brain MRI negative for acute abnormality. ? gout.  PMH - dementia, HTN, ?TIA.   OT comments  Upon arrival, pt sitting in bed with HOB elevated and wife and family friend at bedside. Pt progressing towards established OT goals and pleasantly agreeable to OOB activity. Pt highly distractible and impulsive throughout session. Benefited from calm environment and decreased visual distractions. Pt donning his socks at EOB with Min A and Max cues to initiate task. Pt performing functional mobility in hallway with Min-Mod A for balance and Max cues for safety and navigating hallway. Continue to recommend dc to home with HHOT and will continue to follow acutely as admitted.    Follow Up Recommendations  Home health OT;Supervision/Assistance - 24 hour    Equipment Recommendations   (TBD)    Recommendations for Other Services  (TBD)    Precautions / Restrictions Precautions Precautions: Fall Precaution Comments: Very distractable and impulsive       Mobility Bed Mobility Overal bed mobility: Needs Assistance Bed Mobility: Supine to Sit     Supine to sit: HOB elevated;Min assist     General bed mobility comments: Bringing BLEs towards EOB. Holding therapist's hand and pulling into upright position.  Transfers Overall transfer level: Needs assistance Equipment used: None Transfers: Sit to/from Stand Sit to Stand: Min assist         General transfer comment: Min A for power up and correcting posterior lean.    Balance Overall balance assessment: Needs assistance Sitting-balance support: Feet supported;No upper extremity supported Sitting balance-Leahy Scale: Fair Sitting balance - Comments: Close min guard A for safety   Standing balance support: During functional  activity;Single extremity supported Standing balance-Leahy Scale: Poor Standing balance comment: Reliant on Min-Mod A for balance                           ADL either performed or assessed with clinical judgement   ADL Overall ADL's : Needs assistance/impaired                     Lower Body Dressing: Minimal assistance;Moderate assistance;Sit to/from stand Lower Body Dressing Details (indicate cue type and reason): Pt first flipping socks inside out and then attempting to don them. Min A to place sock over toes and then pt completing task with close MIn Guard A for safety. Min-Mod A for standing balance Toilet Transfer: Moderate assistance;Minimal assistance;Ambulation (simulated to recliner)           Functional mobility during ADLs: Minimal assistance;Moderate assistance (single hand held A) General ADL Comments: Pt very distractable and impulsive. Very "busy". Pt donning his socks while sitting at EOB. Pt performing functional mobility in hallway with Min-Mod A for balance. Feel he would do better in his home environement     Vision   Vision Assessment?: No apparent visual deficits   Perception     Praxis      Cognition Arousal/Alertness: Awake/alert Behavior During Therapy: Impulsive Overall Cognitive Status: Impaired/Different from baseline Area of Impairment: Following commands;Orientation;Attention;Safety/judgement;Awareness;Problem solving;Memory                 Orientation Level: Disoriented to;Time;Situation;Place Current Attention Level: Focused;Sustained (Very short sustained attention. Easily distracted externally and internally) Memory: Decreased recall of precautions;Decreased short-term memory Following  Commands: Follows one step commands with increased time;Follows one step commands inconsistently Safety/Judgement: Decreased awareness of safety;Decreased awareness of deficits Awareness: Intellectual Problem Solving: Difficulty  sequencing;Decreased initiation;Requires tactile cues;Requires verbal cues;Slow processing General Comments: Pt highly distractable both internally and externally. Impulsively reaching out for objects. Following direct, simple cues with quiet environment.        Exercises     Shoulder Instructions       General Comments VSS on RA    Pertinent Vitals/ Pain       Pain Assessment: No/denies pain  Home Living                                          Prior Functioning/Environment              Frequency  Min 2X/week        Progress Toward Goals  OT Goals(current goals can now be found in the care plan section)  Progress towards OT goals: Progressing toward goals  Acute Rehab OT Goals Patient Stated Goal: pt did not explicitly state but family desired for pt to improve OT Goal Formulation: With patient/family Time For Goal Achievement: 07/17/20 Potential to Achieve Goals: Fair ADL Goals Pt Will Perform Grooming: with min assist;with min guard assist;sitting Pt Will Perform Upper Body Bathing: with mod assist;sitting Pt Will Perform Upper Body Dressing: with mod assist;sitting Pt Will Transfer to Toilet: with mod assist;ambulating;regular height toilet;bedside commode  Plan Discharge plan remains appropriate    Co-evaluation                 AM-PAC OT "6 Clicks" Daily Activity     Outcome Measure   Help from another person eating meals?: A Little Help from another person taking care of personal grooming?: A Lot Help from another person toileting, which includes using toliet, bedpan, or urinal?: A Lot Help from another person bathing (including washing, rinsing, drying)?: A Lot Help from another person to put on and taking off regular upper body clothing?: A Lot Help from another person to put on and taking off regular lower body clothing?: A Lot 6 Click Score: 13    End of Session Equipment Utilized During Treatment: Gait belt  OT  Visit Diagnosis: Other abnormalities of gait and mobility (R26.89);Muscle weakness (generalized) (M62.81);Other symptoms and signs involving cognitive function;Pain Pain - Right/Left: Right Pain - part of body: Hand;Arm   Activity Tolerance Patient tolerated treatment well   Patient Left with call bell/phone within reach;with family/visitor present;in chair;with chair alarm set   Nurse Communication Mobility status        Time: 5784-6962 OT Time Calculation (min): 27 min  Charges: OT General Charges $OT Visit: 1 Visit OT Treatments $Self Care/Home Management : 23-37 mins  [place], OTR/L Acute Rehab Pager: [phone] Office: [place] 07/10/2020, 4:09 PM

## 2020-07-10 NOTE — Progress Notes (Signed)
BECAME COMBATIVE WITH STAFF AFTER 8 HOURS OUT OF RESTRAINTS. RESTRAINTS REDEPLOYED ALONG WITH PRNS. MD NOTIFIED.

## 2020-07-11 LAB — BASIC METABOLIC PANEL
Anion gap: 10 (ref 5–15)
BUN: 25 mg/dL — ABNORMAL HIGH (ref 8–23)
CO2: 23 mmol/L (ref 22–32)
Calcium: 8.8 mg/dL — ABNORMAL LOW (ref 8.9–10.3)
Chloride: 107 mmol/L (ref 98–111)
Creatinine, Ser: 0.91 mg/dL (ref 0.61–1.24)
GFR, Estimated: >60 mL/min (ref >60)
Glucose, Bld: 108 mg/dL — ABNORMAL HIGH (ref 70–99)
Potassium: 3.6 mmol/L (ref 3.5–5.1)
Sodium: 140 mmol/L (ref 135–145)

## 2020-07-11 LAB — CBC
HCT: 33.9 % — ABNORMAL LOW (ref 39.0–52.0)
Hemoglobin: 11.1 g/dL — ABNORMAL LOW (ref 13.0–17.0)
MCH: 33 pg (ref 26.0–34.0)
MCHC: 32.7 g/dL (ref 30.0–36.0)
MCV: 100.9 fL — ABNORMAL HIGH (ref 80.0–100.0)
Platelets: 299 10*3/uL (ref 150–400)
RBC: 3.36 MIL/uL — ABNORMAL LOW (ref 4.22–5.81)
RDW: 13 % (ref 11.5–15.5)
WBC: 5.1 10*3/uL (ref 4.0–10.5)
nRBC: 0 % (ref 0.0–0.2)

## 2020-07-11 MED ORDER — QUETIAPINE FUMARATE 100 MG PO TABS
100.0000 mg | ORAL_TABLET | Freq: Every day | ORAL | Status: DC
  Administered 2020-07-11: 100 mg via ORAL
  Filled 2020-07-11: qty 1

## 2020-07-11 MED ORDER — MELATONIN 3 MG PO TABS
3.0000 mg | ORAL_TABLET | Freq: Every day | ORAL | Status: DC
  Administered 2020-07-11: 3 mg via ORAL
  Filled 2020-07-11: qty 1

## 2020-07-11 MED ORDER — QUETIAPINE FUMARATE 25 MG PO TABS
12.5000 mg | ORAL_TABLET | Freq: Two times a day (BID) | ORAL | Status: DC
  Administered 2020-07-11 – 2020-07-12 (×2): 12.5 mg via ORAL
  Filled 2020-07-11 (×2): qty 1

## 2020-07-11 MED ORDER — QUETIAPINE FUMARATE 100 MG PO TABS: 100.0000 mg | ORAL_TABLET | Freq: Every day | ORAL | Status: DC

## 2020-07-11 NOTE — Progress Notes (Signed)
Hospital bed:   Length of Need 6 Months  The above medical condition requires: Patient requires the ability to reposition frequently  Head must be elevated greater than: 30 degrees  Bed type Semi-electric  Support Surface: Gel Overlay    Wheelchair:  Patient suffers from weakness which impairs their ability to perform daily activities like dressing/ bathing in the home. A walker will not resolve  issue with performing activities of daily living. A wheelchair will allow patient to safely perform daily activities. Patient is not able to propel themselves in the home using a standard weight wheelchair due to weakness. Patient can self propel in the lightweight wheelchair. Length of need lifetime.  Accessories: elevating leg rests (ELRs), wheel locks, extensions and anti-tippers.

## 2020-07-11 NOTE — Progress Notes (Addendum)
Physical Therapy Treatment Patient Details Name: Patrick Macias MRN: [id number] DOB: Feb 21, 1941 Today's Date: 07/11/2020    History of Present Illness Pt adm with AMS, fever of unknown origin and rash/hives. Brain MRI negative for acute abnormality. ? gout.  PMH - dementia, HTN, ?TIA.    PT Comments    Pt awake and less agitated this date compared to most recent PT session. Pt able to follow simple commands with increased time inconsistently, sometimes requiring hand-over-hand to complete. Pt's cognition is his primary limiting factor with safe mobility as he would acknowledge understanding the cue to remain within the RW, but then do the opposite and require the cues to be repeated again. He was able to come to stand and perform all transfers with minA and ambulate with a RW ~150 ft with modA and a chair follow by his wife this date. Pt tends to drify and stagger to the L, having difficulty maintaining his R hand on the correct position of the RW. Will continue to follow acutely. Current recommendations remain appropriate as pt's family desires him to return home and is obtaining assistance from nurse aids and sitters. Pt's wife extensively educated on necessity for +2 assistance with all mobility at home for safety purposes and to utilize bedside commode with appropriate safe positioning proximal to bed when pt is having a difficult day but otherwise to challenge pt with ambulating to bathroom if safe.  Follow Up Recommendations  Home health PT;Supervision/Assistance - 24 hour     Equipment Recommendations  3in1 (PT);Wheelchair (measurements PT);Wheelchair cushion (measurements PT);Rolling walker with 5" wheels;Hospital bed    Recommendations for Other Services       Precautions / Restrictions Precautions Precautions: Fall Precaution Comments: impulsive Restrictions Weight Bearing Restrictions: No    Mobility  Bed Mobility Overal bed mobility: Needs Assistance Bed Mobility: Sit to  Supine       Sit to supine: Mod assist   General bed mobility comments: Multiple different cues attempted to get pt to return to supine, ultimately requiring modA at trunk and legs to initiate and complete.  Transfers Overall transfer level: Needs assistance Equipment used: Rolling walker (2 wheeled) Transfers: Sit to/from [facility] Sit to Stand: Min assist Stand pivot transfers: Min assist       General transfer comment: MinA to steady and power up to stand, displaying trunk sway and mild posterior lean intermittently. Cues for hand placement.  Ambulation/Gait Ambulation/Gait assistance: Mod assist Gait Distance (Feet): 150 Feet Assistive device: Rolling walker (2 wheeled) Gait Pattern/deviations: Step-through pattern;Decreased step length - right;Decreased step length - left;Decreased stride length;Staggering left;Drifts right/left;Trunk flexed;Narrow base of support;Shuffle Gait velocity: decreased Gait velocity interpretation: 1.31 - 2.62 ft/sec, indicative of limited community ambulator General Gait Details: ModA for steadying and to manage RW as pt tends to push RW distal to body despite cues to remain within it and bring hips proximal to fornt of RW, pt acknowledges these cues intermittently. As RW was pushed by therapist faster to encourage inc gait speed pt's step length improved. Pt drifts and staggers to L, requiring max cues to try to remain midline in RW with fair to no carryover.   Stairs             Wheelchair Mobility    Modified Rankin (Stroke Patients Only)       Balance Overall balance assessment: Needs assistance Sitting-balance support: Feet supported;No upper extremity supported Sitting balance-Leahy Scale: Good Sitting balance - Comments: Pt reaching off BOS to  donn socks with min guard for safety.   Standing balance support: Bilateral upper extremity supported;During functional activity Standing balance-Leahy Scale:  Poor Standing balance comment: Min-modA and bilat UE support on therapist or RW to maintain standing balance.                            Cognition Arousal/Alertness: Awake/alert Behavior During Therapy: WFL for tasks assessed/performed Overall Cognitive Status: Impaired/Different from baseline Area of Impairment: Following commands;Orientation;Attention;Safety/judgement;Awareness;Problem solving;Memory                 Orientation Level: Disoriented to;Time;Situation;Place (Pt able to choose correct year and month, but not specific day; able to report he is in a medical facility but not specific one) Current Attention Level: Selective Memory: Decreased recall of precautions;Decreased short-term memory Following Commands: Follows one step commands with increased time;Follows one step commands inconsistently Safety/Judgement: Decreased awareness of safety;Decreased awareness of deficits Awareness: Intellectual Problem Solving: Difficulty sequencing;Decreased initiation;Requires tactile cues;Requires verbal cues;Slow processing General Comments: Pt awake and impulsive needing cues for safety throughout session. STM deficits noted with pt requiring repeated cues with poor to no follow through. Max multi-modal cues throughout repeated with pt eventually following some simple directions.      Exercises      General Comments        Pertinent Vitals/Pain Pain Assessment: Faces Faces Pain Scale: No hurt Pain Intervention(s): Monitored during session    Home Living                      Prior Function            PT Goals (current goals can now be found in the care plan section) Acute Rehab PT Goals Patient Stated Goal: pt did not explicitly state but family desired for pt to improve PT Goal Formulation: With patient/family Time For Goal Achievement: 07/15/20 Potential to Achieve Goals: Good Progress towards PT goals: Progressing toward goals     Frequency    Min 3X/week      PT Plan Current plan remains appropriate;Equipment recommendations need to be updated    Co-evaluation              AM-PAC PT "6 Clicks" Mobility   Outcome Measure  Help needed turning from your back to your side while in a flat bed without using bedrails?: A Lot Help needed moving from lying on your back to sitting on the side of a flat bed without using bedrails?: A Lot Help needed moving to and from a bed to a chair (including a wheelchair)?: A Little Help needed standing up from a chair using your arms (e.g., wheelchair or bedside chair)?: A Little Help needed to walk in hospital room?: A Lot Help needed climbing 3-5 steps with a railing? : A Lot 6 Click Score: 14    End of Session Equipment Utilized During Treatment: Gait belt Activity Tolerance: Patient limited by fatigue Patient left: in bed;with call bell/phone within reach;with bed alarm set;with family/visitor present Nurse Communication: Mobility status PT Visit Diagnosis: Unsteadiness on feet (R26.81);Other abnormalities of gait and mobility (R26.89);Muscle weakness (generalized) (M62.81);Difficulty in walking, not elsewhere classified (R26.2)     Time: 1126-1206 PT Time Calculation (min) (ACUTE ONLY): 40 min  Charges:  $Gait Training: 23-37 mins $Therapeutic Activity: 8-22 mins                     Moishe Spice, PT, DPT  Acute Rehabilitation Services  Pager: [phone] Office: [phone]    Jewel Baize 07/11/2020, 12:20 PM

## 2020-07-11 NOTE — Progress Notes (Signed)
PROGRESS NOTE    Patrick Macias  [id number] DOB: [birth year] DOA: 06/30/2020 PCP: Crist Infante, MD   Brief Narrative:  HPI on 06/30/2020 by Dr. Atha Starks Patrick Macias is a 80 y.o. male with hx of vascular dementia, BPH, dyslipidemia, hypertension, who presented to the facility on 06/30/2020 secondary to altered mental status, generalized weakness, fever.  At the time of exam, patient is cephalopathic, thus a complete and thorough HPI review systems unable to be fully obtained.  Information obtained from chart and daughter and wife bedside.  Apparently on Monday, patient developed hives on different parts of his body that on his body but was primarily his back, not in 1 dermatome.  He tried to take Zyrtec with no improvement of his rash.  He had been sleeping well due to the rash.  Then apparently yesterday he hit his hand on something but family does not know what.  Then the morning presentation, patient had a fever, and wife found him in bed significantly altered, encephalopathic, with inability to get out of bed.  He had abnormal speech, shaking, and was not a self and thus EMS was contacted to bring him.  Of note, patient was recently hospitalized in the beginning of December 2021 for encephalopathy.  MRI did not show a CVA but showed findings concerning with chronic microvascular ischemia.  Neurology was consulted and evaluated patient.  Per family there was discussion that he has vascular dementia.  He was scheduled to follow-up with neurology outpatient.  Psychogenic drugs he is on are Lexapro, and galantamine.  Interim history Admitted with AMS and fever. Neurology consulted. So far no infectious etiology found. S/p LP, unremarkable. Neurology feels this could be his dementia.  Also noted to have urinary retention, Foley catheter to be placed 07/04/20 and removed 1/9.  Of place patient on Seroquel.  Patient may likely need inpatient series psych if he does not  improve.   Assessment & Plan   Acute metabolic encephalopathy/Acute delirium with underlying vascular dementia -Patient presented with fever and was placed empirically on IV vancomycin, cefepime and acyclovir -Culture data has remained negative to date -MRI brain showed no acute finding -EEG showed no signs of seizure.  Moderate diffuse encephalopathy nonspecific etiology. -Neurology consulted and appreciated, status post lumbar puncture -Discussed with neurology 07/04/20, Dr. Lorrin Goodell, recommended starting Seroquel at bedtime.  Patient was placed on 25 mg of Seroquel. -Neurology also recommended follow-up as an outpatient given some laboratory studies on the lumbar puncture will take several weeks to result. -Wife tells me that patient was doing well up until he recently had a TIA and thereafter had a gout flare. -Psychiatry consulted and appreciated. Seroquel and Ativan were discontinued, Zyprexa added 2.5 mg daily as well as 2.5 mg nightly.  -However patient's family felt that Zyprexa was causing more agitation.  -Continue B12 supplemenation.  -Continue Seroquel 25mg  at 8am and 4pm, along with 100mg  QHS -discussed at length with family-- he wears hearing aides at home-- has not had while here in the hospital -delirium precautions -family plans to bring home in AM  Fever of unknown origin -Chest x-ray shows no pneumonia -UA unremarkable for infection -Blood cultures show no growth to date -Patient was empirically started on acyclovir, vancomycin, and cefepime, which have now been discontinued -Question if this is due to underlying rheumatological issue -Patient does have a history of gout  -has been afebrile for several days, will continue to monitor  Polyarticular swelling and erythema -At this time no  concern for infection given the imaging shows no effusions -Started on naproxen and PPI for gastric prophylaxis -PT and OT recommending home health  Essential  hypertension -holding home meds  Hypokalemia -repleted  Acute urinary retention -Patient has had several in and out catheterizations he continues to have urinary retention -Possibly secondary to acute illness versus underlying prostate issues -Foley catheter was placed on 07/04/2020 -Continue Flomax -Urology consulted and appreciated and recommended continuing Foley catheter until acute issues have resolved.  May proceed with voiding trial as an inpatient however if unable to successfully pass, will follow up with outpatient urologist Dr. Terie Purser or patient may follow-up with [facility] for outpatient voiding trial: foley catheter removed on 07/08/20  Possible pneumonitis -Overnight 1/5-1/6, patient developed wheezing and other abnormal breath sounds as per family -CXR obtained and showed interstitial edema and/or pneumonitis -Discontinued IVF  -Patient was given dose of Lasix as well as nebulizer treatment -Improved aeration -Currently with no leukocytosis and afebrile  Constipation -Continue Senokot -given suppository with good results  Deconditioning -PT and OT evaulated patient and recommended HH -Feel that patient needs continous PT and OT during hospitalization -patient does lack awareness and needs frequent cueing -family plans to take home  DVT Prophylaxis  SCDs  Code Status: Full  Family Communication: Wife at bedside.  Sister at bedside  Disposition Plan:  Status is: Inpatient  Remains inpatient appropriate because:Inpatient level of care appropriate due to severity of illness   Dispo: The patient is from: Home              Anticipated d/c is to: Home with home health              Anticipated d/c date is: > 3 days              Patient currently is not medically stable to d/c.  Consultants Neurology Urology Psychiatry   Procedures  Lumbar puncture   Subjective:   Per wife, he wears hearing aides at home but has not had here  Objective:    Vitals:   07/11/20 0324 07/11/20 0400 07/11/20 0803 07/11/20 1217  BP: (!) 119/59  124/69 (!) 90/58  Pulse: (!) 55  (!) 53 69  Resp: 16  16 18   Temp: 97.9 F (36.6 C)  98 F (36.7 C) 97.6 F (36.4 C)  TempSrc: Oral  Oral Axillary  SpO2: 95% 96% 99% 98%  Weight:      Height:        Intake/Output Summary (Last 24 hours) at 07/11/2020 1320 Last data filed at 07/10/2020 1800 Gross per 24 hour  Intake -  Output 400 ml  Net -400 ml   Filed Weights   07/04/20 0400 07/06/20 0600 07/08/20 0310  Weight: 83.4 kg 81.3 kg 80.2 kg   Exam  General: in restraints, sedated appearing, easily startled   Cardiovascular: rrr  Respiratory: upper airway sounds cleared with coughing  Extremities: no edema  Neuro: hard of hearing  Psych: able to answer questions when he hears them   Data Reviewed: I have personally reviewed following labs and imaging studies  CBC: Recent Labs  Lab 07/05/20 0709 07/06/20 0228 07/10/20 0203 07/11/20 0911  WBC 3.8* 4.5  --  5.1  HGB 10.2* 10.7* 11.7* 11.1*  HCT 29.0* 30.2* 35.4* 33.9*  MCV 97.6 96.2  --  100.9*  PLT 294 327  --  585   Basic Metabolic Panel: Recent Labs  Lab 07/06/20 0228 07/07/20 0342 07/09/20 0051 07/10/20 0203 07/11/20  0911  NA 137 139 141 141 140  K 3.5 3.9 4.0 4.2 3.6  CL 103 103 108 107 107  CO2 24 25 26  20* 23  GLUCOSE 98 90 122* 116* 108*  BUN 10 12 16 19  25*  CREATININE 0.84 0.93 1.03 1.12 0.91  CALCIUM 8.6* 8.7* 9.0 9.2 8.8*   GFR: Estimated Creatinine Clearance: 72.4 mL/min (by C-G formula based on SCr of 0.91 mg/dL). Liver Function Tests: Recent Labs  Lab 07/10/20 0203  AST 46*  ALT 35  ALKPHOS 45  BILITOT 0.8  PROT 7.2  ALBUMIN 3.7   No results for input(s): LIPASE, AMYLASE in the last 168 hours. Recent Labs  Lab 07/10/20 0203  AMMONIA 16   Coagulation Profile: No results for input(s): INR, PROTIME in the last 168 hours. Cardiac Enzymes: No results for input(s): CKTOTAL, CKMB,  CKMBINDEX, TROPONINI in the last 168 hours. BNP (last 3 results) No results for input(s): PROBNP in the last 8760 hours. HbA1C: No results for input(s): HGBA1C in the last 72 hours. CBG: No results for input(s): GLUCAP in the last 168 hours. Lipid Profile: No results for input(s): CHOL, HDL, LDLCALC, TRIG, CHOLHDL, LDLDIRECT in the last 72 hours. Thyroid Function Tests: No results for input(s): TSH, T4TOTAL, FREET4, T3FREE, THYROIDAB in the last 72 hours. Anemia Panel: No results for input(s): VITAMINB12, FOLATE, FERRITIN, TIBC, IRON, RETICCTPCT in the last 72 hours. Urine analysis:    Component Value Date/Time   COLORURINE YELLOW 07/04/2020 [place] 07/04/2020 1219   LABSPEC 1.016 07/04/2020 1219   PHURINE 5.0 07/04/2020 1219   GLUCOSEU NEGATIVE 07/04/2020 1219   HGBUR MODERATE (A) 07/04/2020 1219   BILIRUBINUR NEGATIVE 07/04/2020 [place] 07/04/2020 1219   PROTEINUR NEGATIVE 07/04/2020 1219   NITRITE NEGATIVE 07/04/2020 1219   LEUKOCYTESUR NEGATIVE 07/04/2020 1219    Recent Results (from the past 240 hour(s))  CSF culture     Status: None   Collection Time: 07/03/20  4:10 PM   Specimen: PATH Cytology CSF; Cerebrospinal Fluid  Result Value Ref Range Status   Specimen Description CSF  Final   Special Requests NONE  Final   Gram Stain   Final    WBC PRESENT, PREDOMINANTLY MONONUCLEAR NO ORGANISMS SEEN CYTOSPIN SMEAR    Culture   Final    NO GROWTH 3 DAYS Performed at [facility], [place] [address], [place], [place] [address]    Report Status 07/07/2020 FINAL  Final      Radiology Studies: No results found.   Scheduled Meds: . aspirin EC  81 mg Oral Daily  . Chlorhexidine Gluconate Cloth  6 each Topical Daily  . [START ON 07/12/2020] cyanocobalamin  1,000 mcg Intramuscular Q Thu   Followed by  . [START ON 08/30/2020] cyanocobalamin  1,000 mcg Intramuscular Q28 days  . lidocaine  1 application Urethral Once  . melatonin  3 mg  Oral QHS  . naproxen  500 mg Oral BID WC  . pantoprazole  40 mg Oral BID  . pneumococcal 23 valent vaccine  0.5 mL Intramuscular Tomorrow-1000  . polyethylene glycol  17 g Oral BID  . QUEtiapine  25 mg Oral BID  . QUEtiapine  50 mg Oral QHS  . rosuvastatin  20 mg Oral QHS  . senna-docusate  1 tablet Oral BID  . tamsulosin  0.4 mg Oral Daily  . thiamine  100 mg Oral Daily   Continuous Infusions: . sodium chloride 10 mL/hr at 07/03/20 0534  LOS: 10 days   Time Spent in minutes   45 minutes (greater than 50% of time spent with patient face to face as well as speaking to family, as well as reviewing records, discussing with consultant, and formulating a plan)   Geradine Girt D.O. on 07/11/2020 at 1:20 PM  Between 7am to 7pm - Please see pager noted on [email or web address]  After 7pm go to [email or web address]  And look for the night coverage person covering for me after hours  [facility]

## 2020-07-11 NOTE — TOC Initial Note (Signed)
Transition of Care [facility]) - Initial/Assessment Note    Patient Details  Name: Patrick Macias MRN: [id number] Date of Birth: 02/17/1941  Transition of Care [facility]) CM/SW Contact:    Pollie Friar, RN Phone Number: 07/11/2020, 1:18 PM  Clinical Narrative:                 CM received a phone call from patients daughter asking CM to meet with patients sister in law. CM met with the sister in law and went over what the family would like to have arranged for d/c home. They would like to have him d/c home tomorrow with [facility] services and DME. CM has arranged for the wheelchair, walker, 3 in 1 to be delivered to the room and the orders for the bed were provided to the sister in law. The family wants to wait on the bed and decide later if they need it.  HH arranged through [facility]. CM has left a voicemail for April through [facility] to see about VA benefits for the patient.  TOC following.  Expected Discharge Plan: [place] Barriers to Discharge: Continued Medical Work up   Patient Goals and CMS Choice Patient states their goals for this hospitalization and ongoing recovery are:: prefer least restrictive environment as possible, home if safe CMS Medicare.gov Compare Post Acute Care list provided to:: Patient Represenative (must comment) Choice offered to / list presented to : Spouse (sister in law)  Expected Discharge Plan and Services Expected Discharge Plan: [place]   Discharge Planning Services: CM Consult   Living arrangements for the past 2 months: Single Family Home                 DME Arranged: 3-N-1,Walker rolling,Hospital bed,Wheelchair manual DME Agency: [employer] Date DME Agency Contacted: 07/11/20   Representative spoke with at DME Agency: [employer] HH Arranged: RN,PT,OT,Nurse's Aide,Social Work [employer] Agency: [facility] [facility] [employer] Date [place]: 07/11/20   Representative spoke with at [place]: [place]  Prior Living Arrangements/Services Living arrangements for the past 2 months: [place] with:: Spouse Patient language and need for interpreter reviewed:: Yes Do you feel safe going back to the place where you live?: Yes      Need for Family Participation in Patient Care: Yes (Comment) Care giver support system in place?: Yes (comment)   Criminal Activity/Legal Involvement Pertinent to Current Situation/Hospitalization: No - Comment as needed  Activities of Daily Living Home Assistive Devices/Equipment: None ADL Screening (condition at time of admission) Patient's cognitive ability adequate to safely complete daily activities?: No Is the patient deaf or have difficulty hearing?: Yes Does the patient have difficulty seeing, even when wearing glasses/contacts?: No Does the patient have difficulty concentrating, remembering, or making decisions?: Yes Patient able to express need for assistance with ADLs?: No Does the patient have difficulty dressing or bathing?: Yes Independently performs ADLs?: No Does the patient have difficulty walking or climbing stairs?: Yes Weakness of Legs: None Weakness of Arms/Hands: Both  Permission Sought/Granted                  Emotional Assessment Appearance:: Appears stated age Attitude/Demeanor/Rapport: Gracious   Orientation: : Oriented to Self   Psych Involvement: Yes (comment)  Admission diagnosis:  SIRS (systemic inflammatory response syndrome) (HCC) [R65.10] Pain of right hand [M79.641] Generalized weakness [R53.1] Urticaria [D35.7] Metabolic encephalopathy [S17.79] Patient Active Problem List   Diagnosis Date  Noted  . SIRS (systemic inflammatory response syndrome) ([place]) 06/30/2020  . Metabolic encephalopathy 50/27/7412  . Cognitive impairment 06/04/2020  . TIA (transient ischemic attack) 06/01/2020  . Dementia without behavioral disturbance ([place]) 06/01/2020  . Anxiety 06/01/2020  . HTN  (hypertension) 06/01/2020  . Right shoulder pain 08/10/2013   PCP:  Crist Infante, MD Pharmacy:   [facility] [place], [place] [address] Phone: [phone] Fax: [phone]     Social Determinants of Health (SDOH) Interventions    Readmission Risk Interventions No flowsheet data found.

## 2020-07-12 DIAGNOSIS — R531 Weakness: Secondary | ICD-10-CM | POA: Diagnosis not present

## 2020-07-12 DIAGNOSIS — R4189 Other symptoms and signs involving cognitive functions and awareness: Secondary | ICD-10-CM

## 2020-07-12 DIAGNOSIS — G9341 Metabolic encephalopathy: Secondary | ICD-10-CM | POA: Diagnosis not present

## 2020-07-12 DIAGNOSIS — R6510 Systemic inflammatory response syndrome (SIRS) of non-infectious origin without acute organ dysfunction: Secondary | ICD-10-CM | POA: Diagnosis not present

## 2020-07-12 LAB — URINALYSIS, ROUTINE W REFLEX MICROSCOPIC
Bacteria, UA: NONE SEEN
Bilirubin Urine: NEGATIVE
Glucose, UA: NEGATIVE mg/dL
Hgb urine dipstick: NEGATIVE
Ketones, ur: NEGATIVE mg/dL
Leukocytes,Ua: NEGATIVE
Nitrite: NEGATIVE
Protein, ur: 30 mg/dL — AB
Specific Gravity, Urine: 1.027 (ref 1.005–1.030)
pH: 5 (ref 5.0–8.0)

## 2020-07-12 MED ORDER — QUETIAPINE FUMARATE 25 MG PO TABS: 12.5000 mg | ORAL_TABLET | Freq: Two times a day (BID) | ORAL | 0 refills | Status: DC

## 2020-07-12 MED ORDER — NAPROXEN 500 MG PO TABS: 500.0000 mg | ORAL_TABLET | Freq: Two times a day (BID) | ORAL | 0 refills | Status: DC

## 2020-07-12 MED ORDER — PANTOPRAZOLE SODIUM 40 MG PO TBEC: 40.0000 mg | DELAYED_RELEASE_TABLET | Freq: Two times a day (BID) | ORAL | 0 refills | Status: DC

## 2020-07-12 MED ORDER — TAMSULOSIN HCL 0.4 MG PO CAPS: 0.4000 mg | ORAL_CAPSULE | Freq: Every day | ORAL | 0 refills | Status: DC

## 2020-07-12 MED ORDER — QUETIAPINE FUMARATE 100 MG PO TABS: 100.0000 mg | ORAL_TABLET | Freq: Every day | ORAL | 0 refills | Status: DC

## 2020-07-12 MED ORDER — BISACODYL 10 MG RE SUPP: 10.0000 mg | Freq: Every day | RECTAL | 0 refills | Status: DC | PRN

## 2020-07-12 MED ORDER — MELATONIN 3 MG PO TABS: 3.0000 mg | ORAL_TABLET | Freq: Every day | ORAL | 0 refills | Status: DC

## 2020-07-12 MED ORDER — VITAMIN B-12 1000 MCG PO TABS: 1000.0000 ug | ORAL_TABLET | Freq: Every day | ORAL | Status: DC

## 2020-07-12 MED ORDER — THIAMINE HCL 100 MG PO TABS: 100.0000 mg | ORAL_TABLET | Freq: Every day | ORAL | 0 refills | Status: DC

## 2020-07-12 NOTE — Progress Notes (Signed)
Physical Therapy Treatment Patient Details Name: Patrick Macias MRN: [id number] DOB: [birth year] Today's Date: 07/12/2020    History of Present Illness Pt adm with AMS, fever of unknown origin and rash/hives. Brain MRI negative for acute abnormality. ? gout.  PMH - dementia, HTN, ?TIA.    PT Comments    PT duck-tailed OT session for +2 assistance with stair negotiation for safety. Pt demonstrates improved safety and independence with mobility this date, ambulating x2 bouts of ~100 ft > ~250 ft with a RW and min guard-minA without a seated rest break but rather stair negotiation between the gait bouts. He navigated 4 stairs with a reciprocal gait pattern and use of bilat rails and minAx2. Pt's wife extensively educated via demonstration and verbal and written directions on how to properly guard the pt with mobility in the home and with stairs, with pt's wife able to demonstrate understanding. Pt continues to display cognitive deficits that impact his safety with him only following ~75% of simple commands and easily becoming distracted with stimulating environments. Improved concentration in quieter and less stimulating environments. Will continue to follow acutely. Current recommendations remain appropriate.    Follow Up Recommendations  Home health PT;Supervision/Assistance - 24 hour     Equipment Recommendations  3in1 (PT);Wheelchair (measurements PT);Wheelchair cushion (measurements PT);Rolling walker with 5" wheels;Hospital bed    Recommendations for Other Services       Precautions / Restrictions Precautions Precautions: Fall Precaution Comments: Poor attention and impulsive Restrictions Weight Bearing Restrictions: No    Mobility  Bed Mobility Overal bed mobility: Needs Assistance Bed Mobility: Sit to Supine     Supine to sit: Min guard;HOB elevated Sit to supine: Min guard;HOB elevated   General bed mobility comments: x2 cues to return to supine before pt able to  understand cues and initiate transition. used rails and HOB elevated with min guard to return to supine.  Transfers Overall transfer level: Needs assistance Equipment used: None Transfers: Sit to/from Stand Sit to Stand: Min guard;Min assist         General transfer comment: Pt standing upon arrival, duck-tailing OT session.  Ambulation/Gait Ambulation/Gait assistance: Min assist Gait Distance (Feet): 250 Feet (x2 bouts of ~100 ft > ~250 ft with stair bout between gait bouts, no seated rest break) Assistive device: Rolling walker (2 wheeled) Gait Pattern/deviations: Step-through pattern;Decreased step length - right;Decreased step length - left;Decreased stride length;Drifts right/left;Trunk flexed;Narrow base of support Gait velocity: decreased Gait velocity interpretation: <1.8 ft/sec, indicate of risk for recurrent falls General Gait Details: MinA for steadying and managing RW, with pt looking inferiorly with trunk flexed despite max repeated cues to look anteriorly. Decreased step length and narrow BOS. Cued to increase stance width, with momentary success in static standing but returned to narrow gait immediately. Pt's wife educated and performed guarding of pt on 2nd gait bout, x1 LOB with wife assisting recovery of balance safely.   Stairs Stairs: Yes Stairs assistance: Min assist;+2 safety/equipment Stair Management: Two rails;Alternating pattern Number of Stairs: 4 General stair comments: Ascending and descending with bilat hand rails and reciprocal gait pattern, no overt LOb but trunk sway noted and difficulty following directions thus minAx2 provided for safety. Multi-modal cues to advance hands on rails with each step, fair carryover. Cues to bring toes proximal to step prior to descending, poor carryover. Educated pt's wife on proper guarding with stairs for home.   Wheelchair Mobility    Modified Rankin (Stroke Patients Only)       Balance  Overall balance  assessment: Needs assistance Sitting-balance support: Feet supported;No upper extremity supported Sitting balance-Leahy Scale: Good Sitting balance - Comments: Supervision for safety no LOB.   Standing balance support: Bilateral upper extremity supported;During functional activity Standing balance-Leahy Scale: Poor Standing balance comment: Min guard-minA and bilat UE support to maintain standing balance.                            Cognition Arousal/Alertness: Awake/alert Behavior During Therapy: WFL for tasks assessed/performed Overall Cognitive Status: Impaired/Different from baseline Area of Impairment: Following commands;Orientation;Attention;Safety/judgement;Awareness;Problem solving;Memory                   Current Attention Level: Selective;Sustained (improved focus in less distractible environments) Memory: Decreased recall of precautions;Decreased short-term memory Following Commands: Follows one step commands with increased time;Follows one step commands inconsistently (follows ~75% of commands) Safety/Judgement: Decreased awareness of safety;Decreased awareness of deficits Awareness: Intellectual Problem Solving: Difficulty sequencing;Decreased initiation;Requires tactile cues;Requires verbal cues;Slow processing General Comments: Pt awake and able to follow increased percentage of commands this date (~75%) but commands had to be multi-modal simple single step. Pt easily distracted and has better attention in less stimulated environments.      Exercises      General Comments General comments (skin integrity, edema, etc.): Wife and sister present throughout session      Pertinent Vitals/Pain Pain Assessment: Faces Faces Pain Scale: No hurt Pain Intervention(s): Monitored during session    Home Living                      Prior Function            PT Goals (current goals can now be found in the care plan section) Acute Rehab PT  Goals Patient Stated Goal: pt did not explicitly state but family desired to be educated on how to guard pt at home PT Goal Formulation: With patient/family Time For Goal Achievement: 07/15/20 Potential to Achieve Goals: Good Progress towards PT goals: Progressing toward goals    Frequency    Min 3X/week      PT Plan Current plan remains appropriate;Equipment recommendations need to be updated    Co-evaluation   Reason for Co-Treatment: To address functional/ADL transfers   OT goals addressed during session: ADL's and self-care      AM-PAC PT "6 Clicks" Mobility   Outcome Measure  Help needed turning from your back to your side while in a flat bed without using bedrails?: A Little Help needed moving from lying on your back to sitting on the side of a flat bed without using bedrails?: A Little Help needed moving to and from a bed to a chair (including a wheelchair)?: A Little Help needed standing up from a chair using your arms (e.g., wheelchair or bedside chair)?: A Little Help needed to walk in hospital room?: A Little Help needed climbing 3-5 steps with a railing? : A Little 6 Click Score: 18    End of Session Equipment Utilized During Treatment: Gait belt Activity Tolerance: Patient tolerated treatment well Patient left: in bed;with call bell/phone within reach;with bed alarm set;with family/visitor present Nurse Communication: Mobility status PT Visit Diagnosis: Unsteadiness on feet (R26.81);Other abnormalities of gait and mobility (R26.89);Muscle weakness (generalized) (M62.81);Difficulty in walking, not elsewhere classified (R26.2)     Time: [id number] PT Time Calculation (min) (ACUTE ONLY): 33 min  Charges:  $Gait Training: 23-37 mins  Moishe Spice, PT, DPT Acute Rehabilitation Services  Pager: [phone] Office: [place] 07/12/2020, 12:08 PM

## 2020-07-12 NOTE — Progress Notes (Signed)
Urine was collected for urinalysis via clean catch.

## 2020-07-12 NOTE — Discharge Summary (Signed)
Physician Discharge Summary  LIUM KULPA [id number] DOB: 04-Oct-1940 DOA: 06/30/2020  PCP: Crist Infante, MD  Admit date: 06/30/2020 Discharge date: 07/12/2020  Admitted From: Home Discharge disposition: Home   Recommendations for Outpatient Follow-Up:   1. Home health 2. EKG at next office visit for QTC 3. Slowly transition off Seroquel 4. Referral placed to neurology with Dr. Krista Blue to follow-up on pending labs from lumbar puncture 5. Patient will also need to follow-up with urology 6. Bowel regimen 7. Monitor B12 levels, may need resumption of IM B12   Discharge Diagnosis:   Active Problems:   HTN (hypertension)   SIRS (systemic inflammatory response syndrome) (HCC)   Metabolic encephalopathy    Discharge Condition: Improved.  Diet recommendation: Low sodium, heart healthy.    Wound care: None.  Code status: Full.   History of Present Illness:   HODGE SHIFFLER is a 80 y.o. male with hx of vascular dementia, BPH, dyslipidemia, hypertension, who presented to the facility on 06/30/2020 secondary to altered mental status, generalized weakness, fever.  At the time of exam, patient is cephalopathic, thus a complete and thorough HPI review systems unable to be fully obtained.  Information obtained from chart and daughter and wife bedside.  Apparently on Monday, patient developed hives on different parts of his body that on his body but was primarily his back, not in 1 dermatome.  He tried to take Zyrtec with no improvement of his rash.  He had been sleeping well due to the rash.  Then apparently yesterday he hit his hand on something but family does not know what.  Then the morning presentation, patient had a fever, and wife found him in bed significantly altered, encephalopathic, with inability to get out of bed.  He had abnormal speech, shaking, and was not a self and thus EMS was contacted to bring him.  Of note, patient was recently hospitalized in the  beginning of December 2021 for encephalopathy.  MRI did not show a CVA but showed findings concerning with chronic microvascular ischemia.  Neurology was consulted and evaluated patient.  Per family there was discussion that he has vascular dementia.  He was scheduled to follow-up with neurology outpatient.  Psychogenic drugs he is on are Lexapro, and galantamine.    Hospital Course by Problem:   Acute metabolic encephalopathy/Acute delirium with underlying vascular dementia -Patient presented with fever and was placed empirically on IV vancomycin, cefepime and acyclovir -Culture data has remained negative to date -MRI brain showed no acute finding -EEG showed no signs of seizure.  Moderate diffuse encephalopathy nonspecific etiology. -Neurology consulted and appreciated, status post lumbar puncture -Discussed with neurology 07/04/20, Dr. Lorrin Goodell, recommended starting Seroquel at bedtime.  Patient was placed on 25 mg of Seroquel. -Neurology also recommended follow-up as an outpatient given some laboratory studies on the lumbar puncture will take several weeks to result. -Wife tells me that patient was doing well up until he recently had a TIA and thereafter had a gout flare. -Psychiatry consulted and appreciated. Seroquel and Ativan were discontinued, Zyprexa added 2.5 mg daily as well as 2.5 mg nightly.  -However patient's family felt that Zyprexa was causing more agitation.  -Continue B12 supplemenation.  -Continue Seroquel 25mg  at 8am and 4pm, along with 100mg  QHS -discussed at length with family-- he wears hearing aides at home-- has not had while here in the hospital -delirium precautions -family plans to bring home   Fever of unknown origin -Chest x-ray shows no  pneumonia -UA unremarkable for infection -Blood cultures show no growth to date -Patient was empirically started on acyclovir, vancomycin, and cefepime, which have now been discontinued -Question if this is due to  underlying rheumatological issue -Patient does have a history of gout  -has been afebrile for several days  Polyarticular swelling and erythema -At this time no concern for infection given the imaging shows no effusions -Started on naproxen and PPI for gastric prophylaxis -PT and OT recommending home health   Hypokalemia -repleted  Acute urinary retention -Patient has had several in and out catheterizations he continues to have urinary retention -Possibly secondary to acute illness versus underlying prostate issues -Foley catheter was placed on 07/04/2020 ; foley catheter removed on 07/08/20 -Continue Flomax -Urology consulted and appreciated and recommended continuing Foley catheter until acute issues have resolved.  and outpatient follow up  Possible pneumonitis -Overnight 1/5-1/6, patient developed wheezing and other abnormal breath sounds as per family -CXR obtained and showed interstitial edema and/or pneumonitis -Discontinued IVF  -Patient was given dose of Lasix as well as nebulizer treatment -Improved aeration -Currently with no leukocytosis and afebrile  Constipation -Continue Senokot -given suppository with good results  Deconditioning -PT and OT evaulated patient and recommended HH -Patient much improved on day of discharge    Medical Consultants:   Dietary Neurology Neurology   Discharge Exam:   Vitals:   07/12/20 0543 07/12/20 0728  BP: (!) 149/69 (!) 142/72  Pulse: (!) 53 (!) 51  Resp: 16 18  Temp: 97.6 F (36.4 C) 98 F (36.7 C)  SpO2: 99% 98%   Vitals:   07/11/20 1618 07/11/20 1927 07/12/20 0543 07/12/20 0728  BP: (!) 153/73 137/68 (!) 149/69 (!) 142/72  Pulse: 63 63 (!) 53 (!) 51  Resp: 18 16 16 18   Temp: 97.8 F (36.6 C) 97.7 F (36.5 C) 97.6 F (36.4 C) 98 F (36.7 C)  TempSrc: Oral Oral Oral Oral  SpO2: 98% 99% 99% 98%  Weight:      Height:        General exam: Appears calm and comfortable.    The results of  significant diagnostics from this hospitalization (including imaging, microbiology, ancillary and laboratory) are listed below for reference.     Procedures and Diagnostic Studies:   CT Head Wo Contrast  Result Date: 06/30/2020 CLINICAL DATA:  Mental status changes EXAM: CT HEAD WITHOUT CONTRAST TECHNIQUE: Contiguous axial images were obtained from the base of the skull through the vertex without intravenous contrast. COMPARISON:  06/01/2020 FINDINGS: Brain: There is atrophy and chronic small vessel disease changes. No acute intracranial abnormality. Specifically, no hemorrhage, hydrocephalus, mass lesion, acute infarction, or significant intracranial injury. Vascular: No hyperdense vessel or unexpected calcification. Skull: No acute calvarial abnormality. Sinuses/Orbits: No acute findings Other: None IMPRESSION: Atrophy, chronic microvascular disease. No acute intracranial abnormality. Electronically Signed   By: Rolm Baptise M.D.   On: 06/30/2020 12:38   DG Hand 2 View Left  Result Date: 06/30/2020 CLINICAL DATA:  Acute pain EXAM: LEFT HAND - 2 VIEW COMPARISON:  None. FINDINGS: There is no evidence of fracture or dislocation. There is no evidence of arthropathy or other focal bone abnormality. Soft tissues are unremarkable. IMPRESSION: Negative. Electronically Signed   By: Donavan Foil M.D.   On: 06/30/2020 23:19   DG Chest Port 1 View  Result Date: 06/30/2020 CLINICAL DATA:  Hand pain after injury, confusion EXAM: PORTABLE CHEST 1 VIEW COMPARISON:  06/01/2020 chest radiograph. FINDINGS: Stable cardiomediastinal silhouette with normal heart  size. No pneumothorax. No pleural effusion. Lungs appear clear, with no acute consolidative airspace disease and no pulmonary edema. IMPRESSION: No active disease. Electronically Signed   By: Ilona Sorrel M.D.   On: 06/30/2020 09:13   DG Shoulder Left Port  Result Date: 06/30/2020 CLINICAL DATA:  Shoulder pain EXAM: LEFT SHOULDER COMPARISON:  None. FINDINGS:  Mild AC joint degenerative change. Probable small amount of calcific tendinitis. No fracture or dislocation. IMPRESSION: 1. No acute osseous abnormality. 2. Probable small amount of calcific tendinitis. Electronically Signed   By: Donavan Foil M.D.   On: 06/30/2020 23:22   DG Shoulder Right Port  Result Date: 06/30/2020 CLINICAL DATA:  Acute shoulder pain EXAM: PORTABLE RIGHT SHOULDER COMPARISON:  None. FINDINGS: Mild AC joint degenerative change. No fracture or malalignment, limited positioning on the Y-view. IMPRESSION: No acute osseous abnormality. Electronically Signed   By: Donavan Foil M.D.   On: 06/30/2020 23:21   DG Hand Complete Right  Result Date: 06/30/2020 CLINICAL DATA:  Right hand pain, possible injury EXAM: RIGHT HAND - COMPLETE 3+ VIEW COMPARISON:  None. FINDINGS: There is no evidence of fracture or dislocation. No evidence of inflammatory arthropathy. Circumscribed lucencies in the heads of the third and fourth metacarpal demonstrate a thin peripheral sclerotic margin. These most likely represent subchondral geodes. Soft tissues are unremarkable. IMPRESSION: Negative. Electronically Signed   By: Jacqulynn Cadet M.D.   On: 06/30/2020 09:23   EEG adult  Result Date: 07/01/2020 Lora Havens, MD     07/01/2020  8:55 PM Patient Name: VERBLE STODDART MRN: [id number] Epilepsy Attending: Lora Havens Referring Physician/Provider: Dr Rosalin Hawking Date: 07/01/2020 Duration: 22.58 mins Patient history: y.o. male with PMH of HTN, HLD, recent cognitive decline and recent admission for AMS admitted again for AMS, rash, fever, and whole body ache. EEG to evaluate for seizure Level of alertness: asleep AEDs during EEG study: Ativan Technical aspects: This EEG study was done with scalp electrodes positioned according to the 10-20 International system of electrode placement. Electrical activity was acquired at a sampling rate of 500Hz  and reviewed with a high frequency filter of 70Hz  and a low  frequency filter of 1Hz . EEG data were recorded continuously and digitally stored. Description: No posterior dominant rhythm was seen. Sleep was characterized by vertex waves, sleep spindles (12 to 14 Hz), maximal frontocentral region.  EEG showed continuous generalized 3 to 6 Hz theta-delta slowing.  Hyperventilation and photic stimulation were not performed.   ABNORMALITY -Continuous slow, generalized IMPRESSION: This study is suggestive of moderate diffuse encephalopathy, nonspecific etiology. No seizures or epileptiform discharges were seen throughout the recording. [place]   DG HIP PORT UNILAT WITH PELVIS 1V LEFT  Result Date: 06/30/2020 CLINICAL DATA:  Acute hip pain EXAM: DG HIP (WITH OR WITHOUT PELVIS) 1V PORT LEFT COMPARISON:  None. FINDINGS: There is no evidence of hip fracture or dislocation. There is no evidence of arthropathy or other focal bone abnormality. IMPRESSION: Negative. Electronically Signed   By: Donavan Foil M.D.   On: 06/30/2020 23:20   DG HIP PORT UNILAT WITH PELVIS 1V RIGHT  Result Date: 06/30/2020 CLINICAL DATA:  Acute hip pain EXAM: DG HIP (WITH OR WITHOUT PELVIS) 1V PORT RIGHT COMPARISON:  None. FINDINGS: There is no evidence of hip fracture or dislocation. There is no evidence of arthropathy or other focal bone abnormality. IMPRESSION: Negative. Electronically Signed   By: Donavan Foil M.D.   On: 06/30/2020 23:20     Labs:  Basic Metabolic Panel: Recent Labs  Lab 07/06/20 0228 07/07/20 0342 07/09/20 0051 07/10/20 0203 07/11/20 0911  NA 137 139 141 141 140  K 3.5 3.9 4.0 4.2 3.6  CL 103 103 108 107 107  CO2 24 25 26  20* 23  GLUCOSE 98 90 122* 116* 108*  BUN 10 12 16 19  25*  CREATININE 0.84 0.93 1.03 1.12 0.91  CALCIUM 8.6* 8.7* 9.0 9.2 8.8*   GFR Estimated Creatinine Clearance: 72.4 mL/min (by C-G formula based on SCr of 0.91 mg/dL). Liver Function Tests: Recent Labs  Lab 07/10/20 0203  AST 46*  ALT 35  ALKPHOS 45  BILITOT 0.8  PROT  7.2  ALBUMIN 3.7   No results for input(s): LIPASE, AMYLASE in the last 168 hours. Recent Labs  Lab 07/10/20 0203  AMMONIA 16   Coagulation profile No results for input(s): INR, PROTIME in the last 168 hours.  CBC: Recent Labs  Lab 07/06/20 0228 07/10/20 0203 07/11/20 0911  WBC 4.5  --  5.1  HGB 10.7* 11.7* 11.1*  HCT 30.2* 35.4* 33.9*  MCV 96.2  --  100.9*  PLT 327  --  299   Cardiac Enzymes: No results for input(s): CKTOTAL, CKMB, CKMBINDEX, TROPONINI in the last 168 hours. BNP: Invalid input(s): POCBNP CBG: No results for input(s): GLUCAP in the last 168 hours. D-Dimer No results for input(s): DDIMER in the last 72 hours. Hgb A1c No results for input(s): HGBA1C in the last 72 hours. Lipid Profile No results for input(s): CHOL, HDL, LDLCALC, TRIG, CHOLHDL, LDLDIRECT in the last 72 hours. Thyroid function studies No results for input(s): TSH, T4TOTAL, T3FREE, THYROIDAB in the last 72 hours.  Invalid input(s): FREET3 Anemia work up No results for input(s): VITAMINB12, FOLATE, FERRITIN, TIBC, IRON, RETICCTPCT in the last 72 hours. Microbiology Recent Results (from the past 240 hour(s))  CSF culture     Status: None   Collection Time: 07/03/20  4:10 PM   Specimen: PATH Cytology CSF; Cerebrospinal Fluid  Result Value Ref Range Status   Specimen Description CSF  Final   Special Requests NONE  Final   Gram Stain   Final    WBC PRESENT, PREDOMINANTLY MONONUCLEAR NO ORGANISMS SEEN CYTOSPIN SMEAR    Culture   Final    NO GROWTH 3 DAYS Performed at [facility], [address], [place], [place] [address]    Report Status 07/07/2020 FINAL  Final     Discharge Instructions:   Discharge Instructions    Diet - low sodium heart healthy   Complete by: As directed    Discharge instructions   Complete by: As directed    Would do timed voiding at home Incentive spirometry Bowel regimen for "toothpaste" consistency stools   Increase activity slowly    Complete by: As directed    No wound care   Complete by: As directed      Allergies as of 07/12/2020   No Known Allergies     Medication List    STOP taking these medications   cetirizine 10 MG tablet Commonly known as: ZYRTEC   galantamine 16 MG 24 hr capsule Commonly known as: RAZADYNE ER   lisinopril-hydrochlorothiazide 20-25 MG tablet Commonly known as: ZESTORETIC   loratadine 10 MG tablet Commonly known as: CLARITIN     TAKE these medications   acetaminophen 500 MG tablet Commonly known as: TYLENOL Take 1,000 mg by mouth every 6 (six) hours as needed for headache (pain).   aspirin EC 81 MG  tablet Take 1 tablet (81 mg total) by mouth daily.   bisacodyl 10 MG suppository Commonly known as: DULCOLAX Place 1 suppository (10 mg total) rectally daily as needed for moderate constipation.   cholecalciferol 25 MCG (1000 UNIT) tablet Commonly known as: VITAMIN D3 Take 1,000 Units by mouth daily.   escitalopram 20 MG tablet Commonly known as: LEXAPRO Take 20 mg by mouth daily.   melatonin 3 MG Tabs tablet Take 1 tablet (3 mg total) by mouth at bedtime.   naproxen 500 MG tablet Commonly known as: NAPROSYN Take 1 tablet (500 mg total) by mouth 2 (two) times daily with a meal.   pantoprazole 40 MG tablet Commonly known as: PROTONIX Take 1 tablet (40 mg total) by mouth 2 (two) times daily.   QUEtiapine 100 MG tablet Commonly known as: SEROQUEL Take 1 tablet (100 mg total) by mouth at bedtime.   QUEtiapine 25 MG tablet Commonly known as: SEROQUEL Take 0.5 tablets (12.5 mg total) by mouth 2 (two) times daily.   rosuvastatin 20 MG tablet Commonly known as: CRESTOR Take 20 mg by mouth daily.   tamsulosin 0.4 MG Caps capsule Commonly known as: FLOMAX Take 1 capsule (0.4 mg total) by mouth daily after supper.   thiamine 100 MG tablet Take 1 tablet (100 mg total) by mouth daily.   vitamin B-12 1000 MCG tablet Commonly known as: CYANOCOBALAMIN Take 1 tablet  (1,000 mcg total) by mouth daily.            Durable Medical Equipment  (From admission, onward)         Start     Ordered   07/11/20 1214  For home use only DME Hospital bed  Once       Question Answer Comment  Length of Need 6 Months   The above medical condition requires: Patient requires the ability to reposition frequently   Head must be elevated greater than: 30 degrees   Bed type Semi-electric   Support Surface: Gel Overlay      07/11/20 1214   07/11/20 1212  For home use only DME Walker rolling  Once       Question Answer Comment  Walker: With [place]   Patient needs a walker to treat with the following condition Weakness      07/11/20 1213   07/11/20 1212  For home use only DME lightweight manual wheelchair with seat cushion  Once       Comments: Patient suffers from weakness which impairs their ability to perform daily activities like dressing/ bathing in the home.  A walker will not resolve  issue with performing activities of daily living. A wheelchair will allow patient to safely perform daily activities. Patient is not able to propel themselves in the home using a standard weight wheelchair due to weakness. Patient can self propel in the lightweight wheelchair. Length of need lifetime. Accessories: elevating leg rests (ELRs), wheel locks, extensions and anti-tippers.   07/11/20 1213   07/11/20 1211  For home use only DME 3 n 1  Once        07/11/20 1213          Follow-up Information    [facility].   Specialty: Home Health Services Why: The home health agency will contact you for the first home visit. Contact information: [address] [place] [address] [phone]        Crist Infante, MD Follow up in 1 week(s).  Specialty: Internal Medicine Contact information: [place] [address] [phone]        Kerry Fort., MD. Schedule an appointment as soon as possible for a visit.    Specialty: Urology Contact information: [address] [place]  [address] [phone]                Time coordinating discharge: 35 min  Signed:  Geradine Girt DO  [facility] 07/12/2020, 9:05 AM

## 2020-07-12 NOTE — Progress Notes (Signed)
Occupational Therapy Treatment Patient Details Name: Patrick Macias MRN: [id number] DOB: [birth year] Today's Date: 07/12/2020    History of present illness Pt adm with AMS, fever of unknown origin and rash/hives. Brain MRI negative for acute abnormality. ? gout.  PMH - dementia, HTN, ?TIA.   OT comments  Returned for second session to discuss and educate wife on sensory modification, appropiate use of cues, and environemental changes to optimize safety and decrease burden of care. Wife and sister very receptive and had several good questions about safety, ADLs, and home modications. Continue to recommend dc to home with HHOT. Answering questions in preparation for dc later today.    Follow Up Recommendations  Home health OT;Supervision/Assistance - 24 hour    Equipment Recommendations  3 in 1 bedside commode;Other (comment);Wheelchair (measurements OT);Wheelchair cushion (measurements OT) (Already delivered to room)    Recommendations for Other Services PT consult    Precautions / Restrictions Precautions Precautions: Fall Precaution Comments: Poor attention and impulsivity Restrictions Weight Bearing Restrictions: No       Mobility Bed Mobility         Transfers             Balance                              ADL either performed or assessed with clinical judgement   ADL Overall ADL's : Needs assistance/impaired                         General ADL Comments: Returned for second session to discuss and educate wife on sensory modification, appropiate use of cues, and environemental changes to optimize safety and decrease burden of care. Wife and sister very receptive and had several good questions about safety, ADLs, and home modications.Educating wife on use of simple, direct cues during ADLs. Also benefits of calm and quiet environment. Recommending decreasing clutter and remove throw rugs.      Vision       Perception     Praxis       Cognition Arousal/Alertness: Awake/alert Behavior During Therapy: WFL for tasks assessed/performed Overall Cognitive Status: Impaired/Different from baseline Area of Impairment: Attention;Memory;Following commands;Safety/judgement;Awareness;Problem solving                                 Exercises     Shoulder Instructions       General Comments wife and sister present. Pt resting in bed    Pertinent Vitals/ Pain       Pain Assessment: Faces Faces Pain Scale: No hurt Pain Intervention(s): Monitored during session  Home Living                                          Prior Functioning/Environment              Frequency  Min 2X/week        Progress Toward Goals  OT Goals(current goals can now be found in the care plan section)  Progress towards OT goals: Progressing toward goals  Acute Rehab OT Goals Patient Stated Goal: pt did not explicitly state but family desired for pt to improve OT Goal Formulation: With patient/family Time For Goal Achievement: 07/17/20 Potential to Achieve Goals: Fair ADL Goals  Pt Will Perform Grooming: with min assist;with min guard assist;sitting Pt Will Perform Upper Body Bathing: with mod assist;sitting Pt Will Perform Upper Body Dressing: with mod assist;sitting Pt Will Transfer to Toilet: with mod assist;ambulating;regular height toilet;bedside commode  Plan Discharge plan remains appropriate    Co-evaluation    PT/OT/SLP Co-Evaluation/Treatment: Yes ([place]) Reason for Co-Treatment: To address functional/ADL transfers   OT goals addressed during session: ADL's and self-care      AM-PAC OT "6 Clicks" Daily Activity     Outcome Measure   Help from another person eating meals?: A Little Help from another person taking care of personal grooming?: A Little Help from another person toileting, which includes using toliet, bedpan, or urinal?: A Lot Help from another person bathing  (including washing, rinsing, drying)?: A Lot Help from another person to put on and taking off regular upper body clothing?: A Little Help from another person to put on and taking off regular lower body clothing?: A Lot 6 Click Score: 15    End of Session Equipment Utilized During Treatment: Gait belt;Rolling walker  OT Visit Diagnosis: Other abnormalities of gait and mobility (R26.89);Muscle weakness (generalized) (M62.81);Other symptoms and signs involving cognitive function;Pain Pain - Right/Left: Right Pain - part of body: Hand;Arm   Activity Tolerance Patient tolerated treatment well   Patient Left with family/visitor present;in bed;with call bell/phone within reach;with bed alarm set   Nurse Communication Mobility status        Time: 8811-0315 OT Time Calculation (min): 11 min  Charges: OT General Charges $OT Visit: 1 Visit OT Treatments $Self Care/Home Management : 8-22 mins  Baker, OTR/L Acute Rehab Pager: [phone] Office: [place] 07/12/2020, 12:10 PM

## 2020-07-12 NOTE — TOC Transition Note (Signed)
Transition of Care [facility] - CM/SW Discharge Note   Patient Details  Name: Patrick Macias MRN: [id number] Date of Birth: [birth year]  [facility]) CM/SW Contact:  Pollie Friar, RN Phone Number: 07/12/2020, 11:58 AM   Clinical Narrative:    Pt is discharging home with [facility] through [facility]. Appt have already been arranged for [facility] to see him starting tomorrow.  DME for home has been delivered.  Family providing transport home.    Final next level of care: [place] Barriers to Discharge: No Barriers Identified   Patient Goals and CMS Choice Patient states their goals for this hospitalization and ongoing recovery are:: prefer least restrictive environment as possible, home if safe CMS Medicare.gov Compare Post Acute Care list provided to:: Patient Represenative (must comment) Choice offered to / list presented to : Spouse (sister in law)  Discharge Placement                       Discharge Plan and Services   Discharge Planning Services: CM Consult            DME Arranged: 3-N-1,Walker rolling,Hospital bed,Wheelchair manual DME Agency: [employer] Date DME Agency Contacted: 07/11/20   Representative spoke with at DME Agency: [employer] HH Arranged: RN,PT,OT,Nurse's [place] Work [employer] Agency: [facility] [employer] [facility] [employer] Date [place]: 07/11/20   Representative spoke with at [place]: [place]  Social Determinants of Health ([place]) Interventions     Readmission Risk Interventions No flowsheet data found.

## 2020-07-12 NOTE — Progress Notes (Addendum)
Occupational Therapy Treatment Patient Details Name: Patrick Macias MRN: [id number] DOB: 1941-06-26 Today's Date: 07/12/2020    History of present illness Pt adm with AMS, fever of unknown origin and rash/hives. Brain MRI negative for acute abnormality. ? gout.  PMH - dementia, HTN, ?TIA.   OT comments  Pt progressing towards established OT goals and is presenting with increased attention and awareness. Pt donning socks at EOB with Min guard A. Requiring Min-Mod cues and Min A for donning underwear, pants, and shirt. Pt presenting with difficulty sequencing dressing tasks such as pulling up pants without putting both legs in or attempting to pull head through sleeve instead of neck hole. Pt performing oral care at sink with Min cues and Min Guard-Min A for balance. Dove tail with PT to practice two steps with Min A +2 and Max cues. Wife and sister present throughout session. Continue to recommend dc to home with HHOT and will continue to follow acutely as admitted.    Follow Up Recommendations  Home health OT;Supervision/Assistance - 24 hour    Equipment Recommendations  3 in 1 bedside commode;Other (comment);Wheelchair (measurements OT);Wheelchair cushion (measurements OT) (Already delivered to room)    Recommendations for Other Services PT consult    Precautions / Restrictions Precautions Precautions: Fall Precaution Comments: Poor attention and impulsivity       Mobility Bed Mobility Overal bed mobility: Needs Assistance Bed Mobility: Supine to Sit     Supine to sit: Min guard;HOB elevated     General bed mobility comments: Min Guard A for safety  Transfers Overall transfer level: Needs assistance Equipment used: None Transfers: Sit to/from Stand Sit to Stand: Min guard;Min assist         General transfer comment: Min Guard A for safety. Min A for balance once in standing    Balance Overall balance assessment: Needs assistance Sitting-balance support: No upper  extremity supported;Feet supported Sitting balance-Leahy Scale: Good     Standing balance support: No upper extremity supported;During functional activity Standing balance-Leahy Scale: Poor Standing balance comment: Min Guard A-Min A for static standing balance                           ADL either performed or assessed with clinical judgement   ADL Overall ADL's : Needs assistance/impaired     Grooming: Oral care;Min guard;Standing;Cueing for sequencing;Cueing for safety;Minimal assistance Grooming Details (indicate cue type and reason): Min Guard A-Min A for balance in standing. Requiring Min cues for problem solving and sequencing of oral care. Pt becoming confused by faucet and requiring increased cues for turning water off.         Upper Body Dressing : Minimal assistance;Sitting Upper Body Dressing Details (indicate cue type and reason): Pt attempting to don shirt while standing (as is normal for him) but unable to maintain balance and problem solve sequencing of shirt; attempting to put his head into sleeve. Cueing pt to sit at EOB. Pt requiring Min A to locate head hole and place over head (first attempted to put shirt on leg). Lower Body Dressing: Minimal assistance;Sit to/from stand Lower Body Dressing Details (indicate cue type and reason): Pt donning socks using figure four method with Min Guard A. Pt requiring Min A to locate correct pant holes for legs. Mod cues for sequencing.             Functional mobility during ADLs: Minimal assistance;Rolling walker;Moderate assistance       Vision  Perception     Praxis      Cognition Arousal/Alertness: Awake/alert Behavior During Therapy: WFL for tasks assessed/performed Overall Cognitive Status: Impaired/Different from baseline Area of Impairment: Attention;Memory;Following commands;Safety/judgement;Awareness;Problem solving                   Current Attention Level: Selective;Sustained  (Certain activities able to perform with environmental distractions) Memory: Decreased short-term memory Following Commands: Follows one step commands inconsistently;Follows one step commands with increased time (Follow~75% of simple commands) Safety/Judgement: Decreased awareness of safety;Decreased awareness of deficits Awareness: Intellectual Problem Solving: Slow processing;Difficulty sequencing;Requires verbal cues General Comments: Pt presenting with increased awareness and attention compared to prior session. Pt able to perform dressing and grooming with MIn-Mod cues for sequencing. During grooming, pt able to sustain attention to oral care while family had conversation in room demonstrating selective. During functional mobility with RW, pt requiring increased cues and time as he typically does not use RW. Pt demonstrating increased distractability during mobility and requiring less environmental distractions.        Exercises     Shoulder Instructions       General Comments Wife and sister present throughout session    Pertinent Vitals/ Pain       Pain Assessment: Faces Faces Pain Scale: No hurt Pain Intervention(s): Monitored during session;Limited activity within patient's tolerance;Repositioned  Home Living                                          Prior Functioning/Environment              Frequency  Min 2X/week        Progress Toward Goals  OT Goals(current goals can now be found in the care plan section)  Progress towards OT goals: Progressing toward goals  Acute Rehab OT Goals Patient Stated Goal: pt did not explicitly state but family desired for pt to improve OT Goal Formulation: With patient/family Time For Goal Achievement: 07/17/20 Potential to Achieve Goals: Fair ADL Goals Pt Will Perform Grooming: with min assist;with min guard assist;sitting Pt Will Perform Upper Body Bathing: with mod assist;sitting Pt Will Perform Upper  Body Dressing: with mod assist;sitting Pt Will Transfer to Toilet: with mod assist;ambulating;regular height toilet;bedside commode  Plan Discharge plan remains appropriate    Co-evaluation    PT/OT/SLP Co-Evaluation/Treatment: Yes (Dove tail) Reason for Co-Treatment: To address functional/ADL transfers   OT goals addressed during session: ADL's and self-care      AM-PAC OT "6 Clicks" Daily Activity     Outcome Measure   Help from another person eating meals?: A Little Help from another person taking care of personal grooming?: A Little Help from another person toileting, which includes using toliet, bedpan, or urinal?: A Lot Help from another person bathing (including washing, rinsing, drying)?: A Lot Help from another person to put on and taking off regular upper body clothing?: A Little Help from another person to put on and taking off regular lower body clothing?: A Lot 6 Click Score: 15    End of Session Equipment Utilized During Treatment: Gait belt;Rolling walker  OT Visit Diagnosis: Other abnormalities of gait and mobility (R26.89);Muscle weakness (generalized) (M62.81);Other symptoms and signs involving cognitive function;Pain Pain - Right/Left: Right Pain - part of body: Hand;Arm   Activity Tolerance Patient tolerated treatment well   Patient Left Other (comment);with family/visitor present (in hallway with PT)  Nurse Communication Mobility status        Time: [phone] OT Time Calculation (min): 23 min  Charges: OT General Charges $OT Visit: 1 Visit OT Treatments $Self Care/Home Management : 8-22 mins  [place], OTR/L Acute Rehab Pager: [phone] Office: [place] 07/12/2020, 9:46 AM

## 2020-07-12 NOTE — Progress Notes (Signed)
Pt was bladder scanned and yielded 437ml, reminded the pt to void and he voided, post bladder scanned showed 225 ml, no I & O was done at the moment. Will continue to monitor and pass to oncoming shift

## 2020-07-12 NOTE — Care Management Important Message (Signed)
Important Message  Patient Details  Name: Patrick Macias MRN: [id number] Date of Birth: [birth year]   Medicare Important Message Given:  Yes Im mailed to the patient home address there was a precaution in place .,    Orbie Pyo 07/12/2020, 3:47 PM

## 2020-07-13 DIAGNOSIS — M109 Gout, unspecified: Secondary | ICD-10-CM | POA: Diagnosis not present

## 2020-07-13 DIAGNOSIS — E785 Hyperlipidemia, unspecified: Secondary | ICD-10-CM | POA: Diagnosis not present

## 2020-07-13 DIAGNOSIS — I10 Essential (primary) hypertension: Secondary | ICD-10-CM | POA: Diagnosis not present

## 2020-07-13 DIAGNOSIS — E538 Deficiency of other specified B group vitamins: Secondary | ICD-10-CM | POA: Diagnosis not present

## 2020-07-13 DIAGNOSIS — R6510 Systemic inflammatory response syndrome (SIRS) of non-infectious origin without acute organ dysfunction: Secondary | ICD-10-CM | POA: Diagnosis not present

## 2020-07-13 DIAGNOSIS — N401 Enlarged prostate with lower urinary tract symptoms: Secondary | ICD-10-CM | POA: Diagnosis not present

## 2020-07-13 DIAGNOSIS — M19011 Primary osteoarthritis, right shoulder: Secondary | ICD-10-CM | POA: Diagnosis not present

## 2020-07-13 DIAGNOSIS — R69 Illness, unspecified: Secondary | ICD-10-CM | POA: Diagnosis not present

## 2020-07-13 DIAGNOSIS — R509 Fever, unspecified: Secondary | ICD-10-CM | POA: Diagnosis not present

## 2020-07-14 DIAGNOSIS — E538 Deficiency of other specified B group vitamins: Secondary | ICD-10-CM | POA: Diagnosis not present

## 2020-07-14 DIAGNOSIS — E785 Hyperlipidemia, unspecified: Secondary | ICD-10-CM | POA: Diagnosis not present

## 2020-07-14 DIAGNOSIS — M109 Gout, unspecified: Secondary | ICD-10-CM | POA: Diagnosis not present

## 2020-07-14 DIAGNOSIS — R6510 Systemic inflammatory response syndrome (SIRS) of non-infectious origin without acute organ dysfunction: Secondary | ICD-10-CM | POA: Diagnosis not present

## 2020-07-14 DIAGNOSIS — R509 Fever, unspecified: Secondary | ICD-10-CM | POA: Diagnosis not present

## 2020-07-14 DIAGNOSIS — R69 Illness, unspecified: Secondary | ICD-10-CM | POA: Diagnosis not present

## 2020-07-14 DIAGNOSIS — I10 Essential (primary) hypertension: Secondary | ICD-10-CM | POA: Diagnosis not present

## 2020-07-14 DIAGNOSIS — N401 Enlarged prostate with lower urinary tract symptoms: Secondary | ICD-10-CM | POA: Diagnosis not present

## 2020-07-14 DIAGNOSIS — M19011 Primary osteoarthritis, right shoulder: Secondary | ICD-10-CM | POA: Diagnosis not present

## 2020-07-18 DIAGNOSIS — R69 Illness, unspecified: Secondary | ICD-10-CM | POA: Diagnosis not present

## 2020-07-18 DIAGNOSIS — N401 Enlarged prostate with lower urinary tract symptoms: Secondary | ICD-10-CM | POA: Diagnosis not present

## 2020-07-18 DIAGNOSIS — M19011 Primary osteoarthritis, right shoulder: Secondary | ICD-10-CM | POA: Diagnosis not present

## 2020-07-18 DIAGNOSIS — R509 Fever, unspecified: Secondary | ICD-10-CM | POA: Diagnosis not present

## 2020-07-18 DIAGNOSIS — M109 Gout, unspecified: Secondary | ICD-10-CM | POA: Diagnosis not present

## 2020-07-18 DIAGNOSIS — R6510 Systemic inflammatory response syndrome (SIRS) of non-infectious origin without acute organ dysfunction: Secondary | ICD-10-CM | POA: Diagnosis not present

## 2020-07-18 DIAGNOSIS — E538 Deficiency of other specified B group vitamins: Secondary | ICD-10-CM | POA: Diagnosis not present

## 2020-07-18 DIAGNOSIS — E785 Hyperlipidemia, unspecified: Secondary | ICD-10-CM | POA: Diagnosis not present

## 2020-07-18 DIAGNOSIS — I10 Essential (primary) hypertension: Secondary | ICD-10-CM | POA: Diagnosis not present

## 2020-07-19 DIAGNOSIS — E876 Hypokalemia: Secondary | ICD-10-CM | POA: Diagnosis not present

## 2020-07-19 DIAGNOSIS — M2540 Effusion, unspecified joint: Secondary | ICD-10-CM | POA: Diagnosis not present

## 2020-07-19 DIAGNOSIS — M19011 Primary osteoarthritis, right shoulder: Secondary | ICD-10-CM | POA: Diagnosis not present

## 2020-07-19 DIAGNOSIS — N401 Enlarged prostate with lower urinary tract symptoms: Secondary | ICD-10-CM | POA: Diagnosis not present

## 2020-07-19 DIAGNOSIS — R6510 Systemic inflammatory response syndrome (SIRS) of non-infectious origin without acute organ dysfunction: Secondary | ICD-10-CM | POA: Diagnosis not present

## 2020-07-19 DIAGNOSIS — R509 Fever, unspecified: Secondary | ICD-10-CM | POA: Diagnosis not present

## 2020-07-19 DIAGNOSIS — R9431 Abnormal electrocardiogram [ECG] [EKG]: Secondary | ICD-10-CM | POA: Diagnosis not present

## 2020-07-19 DIAGNOSIS — E538 Deficiency of other specified B group vitamins: Secondary | ICD-10-CM | POA: Diagnosis not present

## 2020-07-19 DIAGNOSIS — R5381 Other malaise: Secondary | ICD-10-CM | POA: Diagnosis not present

## 2020-07-19 DIAGNOSIS — J189 Pneumonia, unspecified organism: Secondary | ICD-10-CM | POA: Diagnosis not present

## 2020-07-19 DIAGNOSIS — K5900 Constipation, unspecified: Secondary | ICD-10-CM | POA: Diagnosis not present

## 2020-07-19 DIAGNOSIS — G9341 Metabolic encephalopathy: Secondary | ICD-10-CM | POA: Diagnosis not present

## 2020-07-19 DIAGNOSIS — I10 Essential (primary) hypertension: Secondary | ICD-10-CM | POA: Diagnosis not present

## 2020-07-19 DIAGNOSIS — M109 Gout, unspecified: Secondary | ICD-10-CM | POA: Diagnosis not present

## 2020-07-19 DIAGNOSIS — R338 Other retention of urine: Secondary | ICD-10-CM | POA: Diagnosis not present

## 2020-07-19 DIAGNOSIS — E785 Hyperlipidemia, unspecified: Secondary | ICD-10-CM | POA: Diagnosis not present

## 2020-07-19 DIAGNOSIS — R69 Illness, unspecified: Secondary | ICD-10-CM | POA: Diagnosis not present

## 2020-07-20 DIAGNOSIS — M109 Gout, unspecified: Secondary | ICD-10-CM | POA: Diagnosis not present

## 2020-07-20 DIAGNOSIS — E538 Deficiency of other specified B group vitamins: Secondary | ICD-10-CM | POA: Diagnosis not present

## 2020-07-20 DIAGNOSIS — E785 Hyperlipidemia, unspecified: Secondary | ICD-10-CM | POA: Diagnosis not present

## 2020-07-20 DIAGNOSIS — R69 Illness, unspecified: Secondary | ICD-10-CM | POA: Diagnosis not present

## 2020-07-20 DIAGNOSIS — M19011 Primary osteoarthritis, right shoulder: Secondary | ICD-10-CM | POA: Diagnosis not present

## 2020-07-20 DIAGNOSIS — R509 Fever, unspecified: Secondary | ICD-10-CM | POA: Diagnosis not present

## 2020-07-20 DIAGNOSIS — R6510 Systemic inflammatory response syndrome (SIRS) of non-infectious origin without acute organ dysfunction: Secondary | ICD-10-CM | POA: Diagnosis not present

## 2020-07-20 DIAGNOSIS — N401 Enlarged prostate with lower urinary tract symptoms: Secondary | ICD-10-CM | POA: Diagnosis not present

## 2020-07-20 DIAGNOSIS — I10 Essential (primary) hypertension: Secondary | ICD-10-CM | POA: Diagnosis not present

## 2020-07-24 DIAGNOSIS — R69 Illness, unspecified: Secondary | ICD-10-CM | POA: Diagnosis not present

## 2020-07-24 DIAGNOSIS — R6510 Systemic inflammatory response syndrome (SIRS) of non-infectious origin without acute organ dysfunction: Secondary | ICD-10-CM | POA: Diagnosis not present

## 2020-07-24 DIAGNOSIS — E785 Hyperlipidemia, unspecified: Secondary | ICD-10-CM | POA: Diagnosis not present

## 2020-07-24 DIAGNOSIS — I10 Essential (primary) hypertension: Secondary | ICD-10-CM | POA: Diagnosis not present

## 2020-07-24 DIAGNOSIS — M109 Gout, unspecified: Secondary | ICD-10-CM | POA: Diagnosis not present

## 2020-07-24 DIAGNOSIS — R509 Fever, unspecified: Secondary | ICD-10-CM | POA: Diagnosis not present

## 2020-07-24 DIAGNOSIS — E538 Deficiency of other specified B group vitamins: Secondary | ICD-10-CM | POA: Diagnosis not present

## 2020-07-24 DIAGNOSIS — N401 Enlarged prostate with lower urinary tract symptoms: Secondary | ICD-10-CM | POA: Diagnosis not present

## 2020-07-24 DIAGNOSIS — M19011 Primary osteoarthritis, right shoulder: Secondary | ICD-10-CM | POA: Diagnosis not present

## 2020-07-26 DIAGNOSIS — M109 Gout, unspecified: Secondary | ICD-10-CM | POA: Diagnosis not present

## 2020-07-26 DIAGNOSIS — N401 Enlarged prostate with lower urinary tract symptoms: Secondary | ICD-10-CM | POA: Diagnosis not present

## 2020-07-26 DIAGNOSIS — E785 Hyperlipidemia, unspecified: Secondary | ICD-10-CM | POA: Diagnosis not present

## 2020-07-26 DIAGNOSIS — R6510 Systemic inflammatory response syndrome (SIRS) of non-infectious origin without acute organ dysfunction: Secondary | ICD-10-CM | POA: Diagnosis not present

## 2020-07-26 DIAGNOSIS — E538 Deficiency of other specified B group vitamins: Secondary | ICD-10-CM | POA: Diagnosis not present

## 2020-07-26 DIAGNOSIS — R69 Illness, unspecified: Secondary | ICD-10-CM | POA: Diagnosis not present

## 2020-07-26 DIAGNOSIS — I10 Essential (primary) hypertension: Secondary | ICD-10-CM | POA: Diagnosis not present

## 2020-07-26 DIAGNOSIS — R509 Fever, unspecified: Secondary | ICD-10-CM | POA: Diagnosis not present

## 2020-07-26 DIAGNOSIS — M19011 Primary osteoarthritis, right shoulder: Secondary | ICD-10-CM | POA: Diagnosis not present

## 2020-07-27 DIAGNOSIS — R339 Retention of urine, unspecified: Secondary | ICD-10-CM | POA: Diagnosis not present

## 2020-07-31 DIAGNOSIS — R509 Fever, unspecified: Secondary | ICD-10-CM | POA: Diagnosis not present

## 2020-07-31 DIAGNOSIS — R69 Illness, unspecified: Secondary | ICD-10-CM | POA: Diagnosis not present

## 2020-07-31 DIAGNOSIS — M19011 Primary osteoarthritis, right shoulder: Secondary | ICD-10-CM | POA: Diagnosis not present

## 2020-07-31 DIAGNOSIS — E785 Hyperlipidemia, unspecified: Secondary | ICD-10-CM | POA: Diagnosis not present

## 2020-07-31 DIAGNOSIS — E538 Deficiency of other specified B group vitamins: Secondary | ICD-10-CM | POA: Diagnosis not present

## 2020-07-31 DIAGNOSIS — N401 Enlarged prostate with lower urinary tract symptoms: Secondary | ICD-10-CM | POA: Diagnosis not present

## 2020-07-31 DIAGNOSIS — I10 Essential (primary) hypertension: Secondary | ICD-10-CM | POA: Diagnosis not present

## 2020-07-31 DIAGNOSIS — M109 Gout, unspecified: Secondary | ICD-10-CM | POA: Diagnosis not present

## 2020-07-31 DIAGNOSIS — R6510 Systemic inflammatory response syndrome (SIRS) of non-infectious origin without acute organ dysfunction: Secondary | ICD-10-CM | POA: Diagnosis not present

## 2020-08-01 ENCOUNTER — Encounter: Payer: Self-pay | Admitting: Neurology

## 2020-08-01 ENCOUNTER — Ambulatory Visit: Payer: Medicare HMO | Admitting: Neurology

## 2020-08-01 ENCOUNTER — Other Ambulatory Visit: Payer: Self-pay

## 2020-08-01 ENCOUNTER — Ambulatory Visit: Payer: Self-pay | Admitting: Neurology

## 2020-08-01 VITALS — BP 134/65 | HR 57 | Ht 71.0 in | Wt 174.2 lb

## 2020-08-01 DIAGNOSIS — M19011 Primary osteoarthritis, right shoulder: Secondary | ICD-10-CM | POA: Diagnosis not present

## 2020-08-01 DIAGNOSIS — M109 Gout, unspecified: Secondary | ICD-10-CM | POA: Diagnosis not present

## 2020-08-01 DIAGNOSIS — N401 Enlarged prostate with lower urinary tract symptoms: Secondary | ICD-10-CM | POA: Diagnosis not present

## 2020-08-01 DIAGNOSIS — F03918 Unspecified dementia, unspecified severity, with other behavioral disturbance: Secondary | ICD-10-CM | POA: Insufficient documentation

## 2020-08-01 DIAGNOSIS — I679 Cerebrovascular disease, unspecified: Secondary | ICD-10-CM | POA: Diagnosis not present

## 2020-08-01 DIAGNOSIS — R509 Fever, unspecified: Secondary | ICD-10-CM | POA: Diagnosis not present

## 2020-08-01 DIAGNOSIS — F0391 Unspecified dementia with behavioral disturbance: Secondary | ICD-10-CM | POA: Diagnosis not present

## 2020-08-01 DIAGNOSIS — E538 Deficiency of other specified B group vitamins: Secondary | ICD-10-CM | POA: Diagnosis not present

## 2020-08-01 DIAGNOSIS — R6510 Systemic inflammatory response syndrome (SIRS) of non-infectious origin without acute organ dysfunction: Secondary | ICD-10-CM | POA: Diagnosis not present

## 2020-08-01 DIAGNOSIS — R69 Illness, unspecified: Secondary | ICD-10-CM | POA: Diagnosis not present

## 2020-08-01 DIAGNOSIS — I10 Essential (primary) hypertension: Secondary | ICD-10-CM | POA: Diagnosis not present

## 2020-08-01 DIAGNOSIS — E785 Hyperlipidemia, unspecified: Secondary | ICD-10-CM | POA: Diagnosis not present

## 2020-08-01 MED ORDER — MEMANTINE HCL 10 MG PO TABS: 10.0000 mg | ORAL_TABLET | Freq: Two times a day (BID) | ORAL | 11 refills | Status: DC

## 2020-08-01 NOTE — Progress Notes (Unsigned)
Chief Complaint  Patient presents with  . New Patient (Initial Visit)    MMSE 23/30 - 5 animals. He is here with his daughter, [name].  Follow up from hospitalization for altered mental status and generalized weakness.    HISTORICAL  Patrick Macias is a 80 year old male, accompanied by his daughter [name], seen in request by his primary care physician Dr. Crist Infante for evaluation of mental status change, memory loss, generalized weakness, initial evaluation was on 08/01/2020,  I reviewed and summarized the referring note.  Past medical history Hyperlipidemia Prostate hypertrophy Anxiety  He has been working for over 40 years, long his own insurance business, his daughter-in-law works with him, he was noted to have gradual onset of memory loss over the past couple years, become obvious since September 2021, daughter recorded Mini-Mental Status Examination by his primary care physician was 24/30, he was noted to have slow reaction time, need long time to finish conversation, often stop in the middle of the sentence.  This has become much more obvious since his hospital admission from January 1-13, 2022, before hospital admission, he developed hives on different part of his body, he has to take Zyrtec at home, with no help, was not able to sleep well due to itching rash, on the day he was admitted to the hospital on June 30, 2020, he was found to have low-grade fever, significant confusion, difficulty getting out of the bed, slurred speech, paramedic brought him to the emergency room,  Emergency room, fever, temperature 100.5, C-reactive protein 12.6,  CT angiogram of head and neck showed no significant large vessel disease  Personally personally reviewed MRI of the brain on 07/02/2020, compared to previous MRI in December 2021, in September 2021, no acute abnormality, advanced supratentorium, periventricular, right paramedian pons T2 flair signal abnormality, most consistent with small  vessel disease,  Laboratory evaluations: CMP, calcium slightly decreased 8.8, glucose was mildly elevated 108, CBC showed hemoglobin of 11.1, ammonia 16, cyclic citrul peptide antibody were negative, hepatitis B surface antigen, HCV antibody, hepatitis a IgM, hepatitis B C IgM were all negative, pro calcitonin was normal, rheumatoid factor was elevated at 30.5, double-stranded DNA was negative, ANA was negative, CMV PCR, EBV DNA PCR, Lyme titer, varicella-zoster PCR, HIV, vitamin B1, vitamin D, RPR, ammonia, Y80, folic acid, TSH, were all normal, A1c was 5.1, ESR was normal 8, negative UDS, the only abnormality was mildly decreased B12 level 221, is on B12 supplement.  CSF study: WBC 1, RBC 2, total protein of 41, glucose of 67, HSV 1/2 PCR negative  Extensive [facility] CSF encephalopathy-autoimmune panels were negative  He also had a hospital admission on December 3-6, 2021, hospital admission for increased confusion, speech difficulty, unsteady on his feet, fidgety,  During today's interview, patient reported he is back to his baseline, wife and daughter reported that he is 75% back to his baseline, was noted to have significant memory loss, Mini-Mental Status Examination is 23 out of 30, often stopping the middle of the sentence, change subject, slow reaction time,   REVIEW OF SYSTEMS: Full 14 system review of systems performed and notable only for as above. All other review of systems were negative.  ALLERGIES: No Known Allergies  HOME MEDICATIONS: Current Outpatient Medications  Medication Sig Dispense Refill  . acetaminophen (TYLENOL) 500 MG tablet Take 1,000 mg by mouth every 6 (six) hours as needed for headache (pain).    Marland Kitchen aspirin EC 81 MG tablet Take 1 tablet (81 mg total)  by mouth daily. 90 tablet 3  . bisacodyl (DULCOLAX) 10 MG suppository Place 1 suppository (10 mg total) rectally daily as needed for moderate constipation. 12 suppository 0  . cholecalciferol (VITAMIN D3) 25  MCG (1000 UNIT) tablet Take 1,000 Units by mouth daily.    Marland Kitchen escitalopram (LEXAPRO) 20 MG tablet Take 20 mg by mouth daily.    . melatonin 3 MG TABS tablet Take 1 tablet (3 mg total) by mouth at bedtime.  0  . Multiple Vitamin (MULTIVITAMIN) tablet Take 1 tablet by mouth daily.    . naproxen (NAPROSYN) 500 MG tablet Take 1 tablet (500 mg total) by mouth 2 (two) times daily with a meal. (Patient taking differently: Take 250 mg by mouth 2 (two) times daily with a meal.) 60 tablet 0  . pantoprazole (PROTONIX) 40 MG tablet Take 1 tablet (40 mg total) by mouth 2 (two) times daily. 60 tablet 0  . rosuvastatin (CRESTOR) 20 MG tablet Take 20 mg by mouth daily.    . tamsulosin (FLOMAX) 0.4 MG CAPS capsule Take 1 capsule (0.4 mg total) by mouth daily after supper. 30 capsule 0   No current facility-administered medications for this visit.    PAST MEDICAL HISTORY: Past Medical History:  Diagnosis Date  . Anxiety   . BPH (benign prostatic hyperplasia)   . Cerumen impaction   . Dyslipidemia   . H/O lithotripsy   . Hearing decreased, bilateral   . Hx of colonic polyps   . Hypertension   . IFG (impaired fasting glucose)   . Macrocytosis   . Memory loss   . Nephrolithiasis   . Vitamin D deficiency     PAST SURGICAL HISTORY: Past Surgical History:  Procedure Laterality Date  . CATARACT EXTRACTION, BILATERAL    . PROSTATECTOMY      FAMILY HISTORY: Family History  Problem Relation Age of Onset  . Leukemia Mother   . Breast cancer Mother   . Other Father        unknown medical history  . Sudden death Neg Hx   . Hypertension Neg Hx   . Hyperlipidemia Neg Hx   . Diabetes Neg Hx   . Heart attack Neg Hx     SOCIAL HISTORY: Social History   Socioeconomic History  . Marital status: Married    Spouse name: Not on file  . Number of children: 2  . Years of education: college  . Highest education level: Bachelor's degree (e.g., BA, AB, BS)  Occupational History  . Occupation: Insurance underwriter    Tobacco Use  . Smoking status: Never Smoker  . Smokeless tobacco: Never Used  Substance and Sexual Activity  . Alcohol use: Not Currently  . Drug use: Never  . Sexual activity: Not on file  Other Topics Concern  . Not on file  Social History Narrative   Lives at home with his wife.   Left-handed.   Two cups caffeine per day.   Social Determinants of Health   Financial Resource Strain: Not on file  Food Insecurity: Not on file  Transportation Needs: Not on file  Physical Activity: Not on file  Stress: Not on file  Social Connections: Not on file  Intimate Partner Violence: Not on file     PHYSICAL EXAM   Vitals:   08/01/20 0858  BP: 134/65  Pulse: (!) 57  Weight: 174 lb 3.2 oz (79 kg)  Height: $Remove'5\' 11"'KBwemaS$  (1.803 m)   Not recorded     Body mass index is  24.3 kg/m.  PHYSICAL EXAMNIATION:  Gen: NAD, conversant, well nourised, well groomed                     Cardiovascular: Regular rate rhythm, no peripheral edema, warm, nontender. Eyes: Conjunctivae clear without exudates or hemorrhage Neck: Supple, no carotid bruits. Pulmonary: Clear to auscultation bilaterally   NEUROLOGICAL EXAM:  MENTAL STATUS: MMSE - Mini Mental State Exam 08/01/2020  Orientation to time 5  Orientation to Place 5  Registration 3  Attention/ Calculation 0  Recall 2  Language- name 2 objects 2  Language- repeat 1  Language- follow 3 step command 3  Language- read & follow direction 1  Write a sentence 1  Copy design 0  Total score 23  animal naming 5.   CRANIAL NERVES: CN II: Visual fields are full to confrontation. Pupils are round equal and briskly reactive to light. CN III, IV, VI: extraocular movement are normal. No ptosis. CN V: Facial sensation is intact to light touch CN VII: Face is symmetric with normal eye closure  CN VIII: Hearing is normal to causal conversation. CN IX, X: Phonation is normal. CN XI: Head turning and shoulder shrug are intact  MOTOR: There is no  pronator drift of out-stretched arms. Muscle bulk and tone are normal. Muscle strength is normal.  REFLEXES: Reflexes are 2+ and symmetric at the biceps, triceps, knees, and ankles. Plantar responses are flexor.  SENSORY: Intact to light touch, pinprick and vibratory sensation are intact in fingers and toes.  COORDINATION: There is no trunk or limb dysmetria noted.  GAIT/STANCE: He needs push-up to get up from seated position, cautious, mildly unsteady Romberg is absent.   DIAGNOSTIC DATA (LABS, IMAGING, TESTING) - I reviewed patient records, labs, notes, testing and imaging myself where available.   ASSESSMENT AND PLAN  NAZIER NEYHART is a 80 y.o. 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 Ph.D.  [facility] [address] [place], [place] [address] Ph: [phone] Fax: [phone]  CC:  Crist Infante, [place],  [place] [address]

## 2020-08-02 DIAGNOSIS — R69 Illness, unspecified: Secondary | ICD-10-CM | POA: Diagnosis not present

## 2020-08-02 DIAGNOSIS — E785 Hyperlipidemia, unspecified: Secondary | ICD-10-CM | POA: Diagnosis not present

## 2020-08-02 DIAGNOSIS — R509 Fever, unspecified: Secondary | ICD-10-CM | POA: Diagnosis not present

## 2020-08-02 DIAGNOSIS — I10 Essential (primary) hypertension: Secondary | ICD-10-CM | POA: Diagnosis not present

## 2020-08-02 DIAGNOSIS — R6510 Systemic inflammatory response syndrome (SIRS) of non-infectious origin without acute organ dysfunction: Secondary | ICD-10-CM | POA: Diagnosis not present

## 2020-08-02 DIAGNOSIS — N401 Enlarged prostate with lower urinary tract symptoms: Secondary | ICD-10-CM | POA: Diagnosis not present

## 2020-08-02 DIAGNOSIS — M19011 Primary osteoarthritis, right shoulder: Secondary | ICD-10-CM | POA: Diagnosis not present

## 2020-08-02 DIAGNOSIS — M109 Gout, unspecified: Secondary | ICD-10-CM | POA: Diagnosis not present

## 2020-08-02 DIAGNOSIS — E538 Deficiency of other specified B group vitamins: Secondary | ICD-10-CM | POA: Diagnosis not present

## 2020-08-06 ENCOUNTER — Other Ambulatory Visit: Payer: Medicare HMO

## 2020-08-08 DIAGNOSIS — M19011 Primary osteoarthritis, right shoulder: Secondary | ICD-10-CM | POA: Diagnosis not present

## 2020-08-08 DIAGNOSIS — R6510 Systemic inflammatory response syndrome (SIRS) of non-infectious origin without acute organ dysfunction: Secondary | ICD-10-CM | POA: Diagnosis not present

## 2020-08-08 DIAGNOSIS — R509 Fever, unspecified: Secondary | ICD-10-CM | POA: Diagnosis not present

## 2020-08-08 DIAGNOSIS — E538 Deficiency of other specified B group vitamins: Secondary | ICD-10-CM | POA: Diagnosis not present

## 2020-08-08 DIAGNOSIS — E785 Hyperlipidemia, unspecified: Secondary | ICD-10-CM | POA: Diagnosis not present

## 2020-08-08 DIAGNOSIS — I10 Essential (primary) hypertension: Secondary | ICD-10-CM | POA: Diagnosis not present

## 2020-08-08 DIAGNOSIS — R69 Illness, unspecified: Secondary | ICD-10-CM | POA: Diagnosis not present

## 2020-08-08 DIAGNOSIS — N401 Enlarged prostate with lower urinary tract symptoms: Secondary | ICD-10-CM | POA: Diagnosis not present

## 2020-08-08 DIAGNOSIS — M109 Gout, unspecified: Secondary | ICD-10-CM | POA: Diagnosis not present

## 2020-08-10 DIAGNOSIS — K5900 Constipation, unspecified: Secondary | ICD-10-CM | POA: Diagnosis not present

## 2020-08-10 DIAGNOSIS — R413 Other amnesia: Secondary | ICD-10-CM | POA: Diagnosis not present

## 2020-08-10 DIAGNOSIS — E538 Deficiency of other specified B group vitamins: Secondary | ICD-10-CM | POA: Diagnosis not present

## 2020-08-10 DIAGNOSIS — R9431 Abnormal electrocardiogram [ECG] [EKG]: Secondary | ICD-10-CM | POA: Diagnosis not present

## 2020-08-10 DIAGNOSIS — R7301 Impaired fasting glucose: Secondary | ICD-10-CM | POA: Diagnosis not present

## 2020-08-10 DIAGNOSIS — R5381 Other malaise: Secondary | ICD-10-CM | POA: Diagnosis not present

## 2020-08-10 DIAGNOSIS — E785 Hyperlipidemia, unspecified: Secondary | ICD-10-CM | POA: Diagnosis not present

## 2020-08-11 DIAGNOSIS — G459 Transient cerebral ischemic attack, unspecified: Secondary | ICD-10-CM | POA: Diagnosis not present

## 2020-08-11 DIAGNOSIS — R6510 Systemic inflammatory response syndrome (SIRS) of non-infectious origin without acute organ dysfunction: Secondary | ICD-10-CM | POA: Diagnosis not present

## 2020-08-11 DIAGNOSIS — R69 Illness, unspecified: Secondary | ICD-10-CM | POA: Diagnosis not present

## 2020-08-13 ENCOUNTER — Other Ambulatory Visit: Payer: Self-pay

## 2020-08-13 ENCOUNTER — Ambulatory Visit: Payer: Medicare HMO | Admitting: Neurology

## 2020-08-13 DIAGNOSIS — R410 Disorientation, unspecified: Secondary | ICD-10-CM

## 2020-08-13 DIAGNOSIS — F0391 Unspecified dementia with behavioral disturbance: Secondary | ICD-10-CM

## 2020-08-15 ENCOUNTER — Other Ambulatory Visit: Payer: Medicare HMO

## 2020-08-21 ENCOUNTER — Ambulatory Visit: Payer: Medicare HMO | Admitting: Neurology

## 2020-08-22 NOTE — Procedures (Signed)
   HISTORY: History: 80 year old male, presented with mental status change TECHNIQUE:  This is a routine 16 channel EEG recording with one channel devoted to a limited EKG recording.  It was performed during wakefulness, drowsiness and asleep.  Hyperventilation and photic stimulation were performed as activating procedures.  There are frequent movement artifact  Upon maximum arousal, posterior dominant waking rhythm consistent of mild dysrhythmic slow activity, 6 to 7 Hz, activities are symmetric over the bilateral posterior derivations and attenuated with eye opening.  Hyperventilation produced mild/moderate buildup with higher amplitude and the slower activities noted.  Photic stimulation did not alter the tracing.  During EEG recording, patient developed drowsiness and no deeper stage of sleep was achieved  During EEG recording, there was no epileptiform discharge noted.  EKG demonstrate sinus rhythm, with heart rate of 56 bpm  CONCLUSION: This is mild abnormal EEG.  There is electrodiagnostic evidence of mild background slowing, indicating mild bihemispheric malfunction, common etiology are metabolic toxic.  Marcial Pacas, M.D. Ph.D.  [facility] [place], [place] [address] Phone: [phone] Fax:      [phone]

## 2020-08-23 DIAGNOSIS — R6510 Systemic inflammatory response syndrome (SIRS) of non-infectious origin without acute organ dysfunction: Secondary | ICD-10-CM | POA: Diagnosis not present

## 2020-08-23 DIAGNOSIS — M19011 Primary osteoarthritis, right shoulder: Secondary | ICD-10-CM | POA: Diagnosis not present

## 2020-08-23 DIAGNOSIS — R509 Fever, unspecified: Secondary | ICD-10-CM | POA: Diagnosis not present

## 2020-08-23 DIAGNOSIS — I10 Essential (primary) hypertension: Secondary | ICD-10-CM | POA: Diagnosis not present

## 2020-08-23 DIAGNOSIS — R69 Illness, unspecified: Secondary | ICD-10-CM | POA: Diagnosis not present

## 2020-08-23 DIAGNOSIS — E538 Deficiency of other specified B group vitamins: Secondary | ICD-10-CM | POA: Diagnosis not present

## 2020-08-23 DIAGNOSIS — E785 Hyperlipidemia, unspecified: Secondary | ICD-10-CM | POA: Diagnosis not present

## 2020-08-23 DIAGNOSIS — N401 Enlarged prostate with lower urinary tract symptoms: Secondary | ICD-10-CM | POA: Diagnosis not present

## 2020-08-23 DIAGNOSIS — M109 Gout, unspecified: Secondary | ICD-10-CM | POA: Diagnosis not present

## 2020-09-08 DIAGNOSIS — R69 Illness, unspecified: Secondary | ICD-10-CM | POA: Diagnosis not present

## 2020-09-08 DIAGNOSIS — R6510 Systemic inflammatory response syndrome (SIRS) of non-infectious origin without acute organ dysfunction: Secondary | ICD-10-CM | POA: Diagnosis not present

## 2020-09-08 DIAGNOSIS — G459 Transient cerebral ischemic attack, unspecified: Secondary | ICD-10-CM | POA: Diagnosis not present

## 2020-09-13 ENCOUNTER — Ambulatory Visit: Payer: Medicare HMO | Admitting: Neurology

## 2020-10-03 DIAGNOSIS — D649 Anemia, unspecified: Secondary | ICD-10-CM | POA: Diagnosis not present

## 2020-10-03 DIAGNOSIS — E538 Deficiency of other specified B group vitamins: Secondary | ICD-10-CM | POA: Diagnosis not present

## 2020-10-03 DIAGNOSIS — D7589 Other specified diseases of blood and blood-forming organs: Secondary | ICD-10-CM | POA: Diagnosis not present

## 2020-10-03 DIAGNOSIS — R7989 Other specified abnormal findings of blood chemistry: Secondary | ICD-10-CM | POA: Diagnosis not present

## 2020-10-03 DIAGNOSIS — E785 Hyperlipidemia, unspecified: Secondary | ICD-10-CM | POA: Diagnosis not present

## 2020-10-09 DIAGNOSIS — R69 Illness, unspecified: Secondary | ICD-10-CM | POA: Diagnosis not present

## 2020-10-09 DIAGNOSIS — G459 Transient cerebral ischemic attack, unspecified: Secondary | ICD-10-CM | POA: Diagnosis not present

## 2020-10-09 DIAGNOSIS — R6510 Systemic inflammatory response syndrome (SIRS) of non-infectious origin without acute organ dysfunction: Secondary | ICD-10-CM | POA: Diagnosis not present

## 2020-11-08 DIAGNOSIS — R69 Illness, unspecified: Secondary | ICD-10-CM | POA: Diagnosis not present

## 2020-11-08 DIAGNOSIS — R6510 Systemic inflammatory response syndrome (SIRS) of non-infectious origin without acute organ dysfunction: Secondary | ICD-10-CM | POA: Diagnosis not present

## 2020-11-08 DIAGNOSIS — G459 Transient cerebral ischemic attack, unspecified: Secondary | ICD-10-CM | POA: Diagnosis not present

## 2020-12-07 DIAGNOSIS — G459 Transient cerebral ischemic attack, unspecified: Secondary | ICD-10-CM | POA: Diagnosis not present

## 2020-12-07 DIAGNOSIS — I2510 Atherosclerotic heart disease of native coronary artery without angina pectoris: Secondary | ICD-10-CM | POA: Diagnosis not present

## 2020-12-07 DIAGNOSIS — R7301 Impaired fasting glucose: Secondary | ICD-10-CM | POA: Diagnosis not present

## 2020-12-07 DIAGNOSIS — I10 Essential (primary) hypertension: Secondary | ICD-10-CM | POA: Diagnosis not present

## 2020-12-07 DIAGNOSIS — E041 Nontoxic single thyroid nodule: Secondary | ICD-10-CM | POA: Diagnosis not present

## 2020-12-07 DIAGNOSIS — E538 Deficiency of other specified B group vitamins: Secondary | ICD-10-CM | POA: Diagnosis not present

## 2020-12-07 DIAGNOSIS — I6381 Other cerebral infarction due to occlusion or stenosis of small artery: Secondary | ICD-10-CM | POA: Diagnosis not present

## 2020-12-07 DIAGNOSIS — R9431 Abnormal electrocardiogram [ECG] [EKG]: Secondary | ICD-10-CM | POA: Diagnosis not present

## 2020-12-07 DIAGNOSIS — E785 Hyperlipidemia, unspecified: Secondary | ICD-10-CM | POA: Diagnosis not present

## 2020-12-07 DIAGNOSIS — R413 Other amnesia: Secondary | ICD-10-CM | POA: Diagnosis not present

## 2020-12-09 DIAGNOSIS — R69 Illness, unspecified: Secondary | ICD-10-CM | POA: Diagnosis not present

## 2020-12-09 DIAGNOSIS — G459 Transient cerebral ischemic attack, unspecified: Secondary | ICD-10-CM | POA: Diagnosis not present

## 2020-12-09 DIAGNOSIS — R6510 Systemic inflammatory response syndrome (SIRS) of non-infectious origin without acute organ dysfunction: Secondary | ICD-10-CM | POA: Diagnosis not present

## 2020-12-14 ENCOUNTER — Telehealth: Payer: Self-pay | Admitting: Physician Assistant

## 2020-12-14 NOTE — Telephone Encounter (Signed)
Scheduled appt per 6/17 referral. Pt aware.

## 2020-12-19 DIAGNOSIS — M7542 Impingement syndrome of left shoulder: Secondary | ICD-10-CM | POA: Diagnosis not present

## 2020-12-19 DIAGNOSIS — M25561 Pain in right knee: Secondary | ICD-10-CM | POA: Diagnosis not present

## 2020-12-27 NOTE — Progress Notes (Signed)
[place] [phone]   [phone] [place] NOTE  Patient Care Team: Crist Infante, MD as PCP - General (Internal Medicine)  Hematological/Oncological History 1) Labs from PCP, Dr. Pati Gallo from [facility].  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   2) 12/28/2020: Establish care with Dede Query PA-C  CHIEF COMPLAINTS/PURPOSE OF CONSULTATION:  "Monoclonal gammopathy of unknown significance "  HISTORY OF PRESENTING ILLNESS:  Patrick Macias 80 y.o. male with medical history significant for anxiety,, anemia, hearing loss, dementia, nephrolithiasis and vitamin D deficiency.  Patient is accompanied by his wife for this visit.  On exam today, Patrick Macias is doing well without any significant imitations.  His energy and appetite are stable.  He is able to complete most of his ADLs on his own.  Patient denies any recent weight changes.  He denies any nausea, vomiting or abdominal pain.  He reports occasional episodes of constipation that resolved on its own.  He denies any easy bruising or signs.  Patient denies any fevers, chills, night sweats, shortness of breath, chest pain or cough.  He has no other complaints.  Rest of the 10 point ROS is below.  MEDICAL HISTORY:  Past Medical History:  Diagnosis Date   Anxiety    BPH (benign prostatic hyperplasia)    Cerumen impaction    Dyslipidemia    H/O lithotripsy    Hearing decreased, bilateral    Hx of colonic polyps    IFG (impaired fasting glucose)    Macrocytosis    Memory loss    Nephrolithiasis    Vitamin D deficiency     SURGICAL HISTORY: Past Surgical History:  Procedure Laterality Date   CATARACT EXTRACTION, BILATERAL     PROSTATECTOMY     Partial    SOCIAL HISTORY: Social History   Socioeconomic History   Marital status: Married    Spouse name: Not on file   Number of children: 2    Years of education: college   Highest education level: Bachelor's degree (e.g., BA, AB, BS)  Occupational History   Occupation: Insurance   Tobacco Use   Smoking status: Never   Smokeless tobacco: Never  Substance and Sexual Activity   Alcohol use: Not Currently   Drug use: Never   Sexual activity: Not on file  Other Topics Concern   Not on file  Social History Narrative   Lives at home with his wife.   Left-handed.   Two cups caffeine per day.   Social Determinants of Health   Financial Resource Strain: Not on file  Food Insecurity: Not on file  Transportation Needs: Not on file  Physical Activity: Not on file  Stress: Not on file  Social Connections: Not on file  Intimate Partner Violence: Not on file    FAMILY HISTORY: Family History  Problem Relation Age of Onset   Leukemia Mother    Breast cancer Mother    Other Father        unknown medical history   Sudden death Neg Hx    Hypertension Neg Hx    Hyperlipidemia Neg Hx    Diabetes Neg Hx    Heart attack Neg Hx     ALLERGIES:  has No Known Allergies.  MEDICATIONS:  Current Outpatient Medications  Medication Sig Dispense Refill   aspirin EC 81 MG tablet Take 1 tablet (81 mg total) by mouth daily. 90 tablet 3  bisacodyl (DULCOLAX) 10 MG suppository Place 1 suppository (10 mg total) rectally daily as needed for moderate constipation. 12 suppository 0   cholecalciferol (VITAMIN D3) 25 MCG (1000 UNIT) tablet Take 1,000 Units by mouth daily.     escitalopram (LEXAPRO) 20 MG tablet Take 20 mg by mouth daily.     memantine (NAMENDA) 10 MG tablet Take 1 tablet (10 mg total) by mouth 2 (two) times daily. 60 tablet 11   Multiple Vitamin (MULTIVITAMIN) tablet Take 1 tablet by mouth daily.     pantoprazole (PROTONIX) 40 MG tablet Take 1 tablet (40 mg total) by mouth 2 (two) times daily. 60 tablet 0   rosuvastatin (CRESTOR) 20 MG tablet Take 20 mg by mouth daily.     acetaminophen (TYLENOL) 500 MG tablet Take 1,000 mg by  mouth every 6 (six) hours as needed for headache (pain). (Patient not taking: Reported on 12/28/2020)     melatonin 3 MG TABS tablet Take 1 tablet (3 mg total) by mouth at bedtime. (Patient not taking: Reported on 12/28/2020)  0   naproxen (NAPROSYN) 500 MG tablet Take 1 tablet (500 mg total) by mouth 2 (two) times daily with a meal. (Patient not taking: Reported on 12/28/2020) 60 tablet 0   tamsulosin (FLOMAX) 0.4 MG CAPS capsule Take 1 capsule (0.4 mg total) by mouth daily after supper. (Patient not taking: Reported on 12/28/2020) 30 capsule 0   No current facility-administered medications for this visit.    REVIEW OF SYSTEMS:   Constitutional: ( - ) fevers, ( - )  chills , ( - ) night sweats Eyes: ( - ) blurriness of vision, ( - ) double vision, ( - ) watery eyes Ears, nose, mouth, throat, and face: ( - ) mucositis, ( - ) sore throat Respiratory: ( - ) cough, ( - ) dyspnea, ( - ) wheezes Cardiovascular: ( - ) palpitation, ( - ) chest discomfort, ( - ) lower extremity swelling Gastrointestinal:  ( - ) nausea, ( - ) heartburn, ( - ) change in bowel habits Skin: ( - ) abnormal skin rashes Lymphatics: ( - ) new lymphadenopathy, ( - ) easy bruising Neurological: ( - ) numbness, ( - ) tingling, ( - ) new weaknesses Behavioral/Psych: ( - ) mood change, ( - ) new changes  All other systems were reviewed with the patient and are negative.  PHYSICAL EXAMINATION: ECOG PERFORMANCE STATUS: 0 - Asymptomatic  Vitals:   12/28/20 0910  BP: (!) 145/60  Pulse: 60  Resp: 18  Temp: 98.1 F (36.7 C)  SpO2: 100%   Filed Weights   12/28/20 0910  Weight: 181 lb 6.4 oz (82.3 kg)    GENERAL: well appearing male in NAD  SKIN: skin color, texture, turgor are normal, no rashes or significant lesions EYES: conjunctiva are pink and non-injected, sclera clear OROPHARYNX: no exudate, no erythema; lips, buccal mucosa, and tongue normal  NECK: supple, non-tender LYMPH:  no palpable lymphadenopathy in the cervical,  axillary or supraclavicular lymph nodes.  LUNGS: clear to auscultation and percussion with normal breathing effort HEART: regular rate & rhythm and no murmurs and no lower extremity edema ABDOMEN: soft, non-tender, non-distended, normal bowel sounds Musculoskeletal: no cyanosis of digits and no clubbing  PSYCH: alert & oriented x 3, fluent speech NEURO: no focal motor/sensory deficits  LABORATORY DATA:  I have reviewed the data as listed CBC Latest Ref Rng & Units 07/11/2020 07/10/2020 07/06/2020  WBC 4.0 - 10.5 K/uL 5.1 - 4.5  Hemoglobin  13.0 - 17.0 g/dL 11.1(L) 11.7(L) 10.7(L)  Hematocrit 39.0 - 52.0 % 33.9(L) 35.4(L) 30.2(L)  Platelets 150 - 400 K/uL 299 - 327    CMP Latest Ref Rng & Units 07/11/2020 07/10/2020 07/09/2020  Glucose 70 - 99 mg/dL 108(H) 116(H) 122(H)  BUN 8 - 23 mg/dL 25(H) 19 16  Creatinine 0.61 - 1.24 mg/dL 0.91 1.12 1.03  Sodium 135 - 145 mmol/L 140 141 141  Potassium 3.5 - 5.1 mmol/L 3.6 4.2 4.0  Chloride 98 - 111 mmol/L 107 107 108  CO2 22 - 32 mmol/L 23 20(L) 26  Calcium 8.9 - 10.3 mg/dL 8.8(L) 9.2 9.0  Total Protein 6.5 - 8.1 g/dL - 7.2 -  Total Bilirubin 0.3 - 1.2 mg/dL - 0.8 -  Alkaline Phos 38 - 126 U/L - 45 -  AST 15 - 41 U/L - 46(H) -  ALT 0 - 44 U/L - 35 -   ASSESSMENT & PLAN Patrick Macias  is a 80 y.o. male presenting to the clinic for evaluation for monoclonal gammopathy. Based on SPEP from 10/03/2020, there is evidence of M-spike 0.7. IgG was elevated at 1545. IFE showed IgG monoclonal protein with kappa light chain specificity.   Recommend full MGUS workup with CBC, CMP, multiple myeloma panel, kappa/lambda light chain, UPEP, beta 2 microglobulin and LDH levels. Bone Survey will be obtained to evaluate for lytic lesions. Plan to follow up with results by phone. If no additional intervention is required, we will plan to see patient back in clinic in 3 months.    #MGUS: --Recommend further workup with CBC, CMP, multiple myeloma panel, kappa/lambda light  chain, UPEP,beta 2 microglobulin and LDH levels.  --Need to obtain DG bone met surgery to evaluate for lytic lesions.  --Will consider bone marrow biopsy based on above workup --RTC in 3 months with labs unless further intervention is required.   Orders Placed This Encounter  Procedures   DG Bone Survey Met    Standing Status:   Future    Standing Expiration Date:   12/27/2021    Order Specific Question:   Reason for Exam (SYMPTOM  OR DIAGNOSIS REQUIRED)    Answer:   evlauate for lytic lesions    Order Specific Question:   Preferred imaging location?    Answer:   [facility]   CBC with Differential (Cancer Center Only)    Standing Status:   Future    Standing Expiration Date:   12/27/2021   CMP (Cancer Center only)    Standing Status:   Future    Standing Expiration Date:   12/27/2021   Lactate dehydrogenase (LDH)    Standing Status:   Future    Standing Expiration Date:   12/27/2021   Beta 2 microglobulin    Standing Status:   Future    Standing Expiration Date:   12/27/2021   24-Hr Ur UPEP/UIFE/Light Chains/TP    Standing Status:   Future    Standing Expiration Date:   12/27/2021   Multiple Myeloma Panel (SPEP&IFE w/QIG)    Standing Status:   Future    Standing Expiration Date:   12/27/2021   Kappa/lambda light chains    Standing Status:   Future    Standing Expiration Date:   12/27/2021    All questions were answered. The patient knows to call the clinic with any problems, questions or concerns.  I have spent a total of 60 minutes minutes of face-to-face and non-face-to-face time, preparing to see the patient, obtaining  and/or reviewing separately obtained history, performing a medically appropriate examination, counseling and educating the patient, ordering tests, documenting clinical information in the electronic health record, and care coordination.   Dede Query, PA-C Department of Hematology/Oncology [place] at [facility] Phone:  [phone]  Patient was seen with Dr. Lorenso Courier.   I have read the above note and personally examined the patient. I agree with the assessment and plan as noted above.  Briefly Patrick Macias is a 80 year old male who presents for evaluation of a monoclonal gammopathy. He was noted to have a M-spike 0.7 (H), with IgG kappa light chain specificity.  Today we will do a full monoclonal dermopathy evaluation to include UPEP, metastatic survey, and serum free light chains.  In the event that there are any concerning findings we can proceed with a bone marrow biopsy.  The patient voiced his understanding with this plan moving forward.   Ledell Peoples, MD Department of Hematology/Oncology [place] at [facility] : [facility] Phone: [phone] Pager: [phone] Email: [email or web address]

## 2020-12-28 ENCOUNTER — Other Ambulatory Visit: Payer: Self-pay

## 2020-12-28 ENCOUNTER — Encounter: Payer: Self-pay | Admitting: Physician Assistant

## 2020-12-28 ENCOUNTER — Inpatient Hospital Stay: Payer: Medicare HMO

## 2020-12-28 ENCOUNTER — Inpatient Hospital Stay: Payer: Medicare HMO | Attending: Physician Assistant | Admitting: Physician Assistant

## 2020-12-28 VITALS — BP 145/60 | HR 60 | Temp 98.1°F | Resp 18 | Ht 71.0 in | Wt 181.4 lb

## 2020-12-28 DIAGNOSIS — D472 Monoclonal gammopathy: Secondary | ICD-10-CM | POA: Diagnosis not present

## 2020-12-28 DIAGNOSIS — Z806 Family history of leukemia: Secondary | ICD-10-CM | POA: Diagnosis not present

## 2020-12-28 DIAGNOSIS — Z803 Family history of malignant neoplasm of breast: Secondary | ICD-10-CM | POA: Diagnosis not present

## 2020-12-28 LAB — CBC WITH DIFFERENTIAL (CANCER CENTER ONLY)
Abs Immature Granulocytes: 0.01 10*3/uL (ref 0.00–0.07)
Basophils Absolute: 0 10*3/uL (ref 0.0–0.1)
Basophils Relative: 0 %
Eosinophils Absolute: 0.1 10*3/uL (ref 0.0–0.5)
Eosinophils Relative: 2 %
HCT: 37.1 % — ABNORMAL LOW (ref 39.0–52.0)
Hemoglobin: 12.6 g/dL — ABNORMAL LOW (ref 13.0–17.0)
Immature Granulocytes: 0 %
Lymphocytes Relative: 31 %
Lymphs Abs: 1.7 10*3/uL (ref 0.7–4.0)
MCH: 34.7 pg — ABNORMAL HIGH (ref 26.0–34.0)
MCHC: 34 g/dL (ref 30.0–36.0)
MCV: 102.2 fL — ABNORMAL HIGH (ref 80.0–100.0)
Monocytes Absolute: 0.6 10*3/uL (ref 0.1–1.0)
Monocytes Relative: 11 %
Neutro Abs: 3.1 10*3/uL (ref 1.7–7.7)
Neutrophils Relative %: 56 %
Platelet Count: 206 10*3/uL (ref 150–400)
RBC: 3.63 MIL/uL — ABNORMAL LOW (ref 4.22–5.81)
RDW: 13.6 % (ref 11.5–15.5)
WBC Count: 5.6 10*3/uL (ref 4.0–10.5)
nRBC: 0 % (ref 0.0–0.2)

## 2020-12-28 LAB — CMP (CANCER CENTER ONLY)
ALT: 17 U/L (ref 0–44)
AST: 21 U/L (ref 15–41)
Albumin: 4.2 g/dL (ref 3.5–5.0)
Alkaline Phosphatase: 47 U/L (ref 38–126)
Anion gap: 8 (ref 5–15)
BUN: 24 mg/dL — ABNORMAL HIGH (ref 8–23)
CO2: 28 mmol/L (ref 22–32)
Calcium: 9.6 mg/dL (ref 8.9–10.3)
Chloride: 103 mmol/L (ref 98–111)
Creatinine: 1.14 mg/dL (ref 0.61–1.24)
GFR, Estimated: >60 mL/min (ref >60)
Glucose, Bld: 90 mg/dL (ref 70–99)
Potassium: 4.2 mmol/L (ref 3.5–5.1)
Sodium: 139 mmol/L (ref 135–145)
Total Bilirubin: 1.1 mg/dL (ref 0.3–1.2)
Total Protein: 7.7 g/dL (ref 6.5–8.1)

## 2020-12-28 LAB — LACTATE DEHYDROGENASE: LDH: 181 U/L (ref 98–192)

## 2020-12-29 LAB — BETA 2 MICROGLOBULIN, SERUM: Beta-2 Microglobulin: 2.2 mg/L (ref 0.6–2.4)

## 2021-01-01 LAB — MULTIPLE MYELOMA PANEL, SERUM
Albumin SerPl Elph-Mcnc: 4 g/dL (ref 2.9–4.4)
Albumin/Glob SerPl: 1.3 (ref 0.7–1.7)
Alpha 1: 0.2 g/dL (ref 0.0–0.4)
Alpha2 Glob SerPl Elph-Mcnc: 0.6 g/dL (ref 0.4–1.0)
B-Globulin SerPl Elph-Mcnc: 0.8 g/dL (ref 0.7–1.3)
Gamma Glob SerPl Elph-Mcnc: 1.6 g/dL (ref 0.4–1.8)
Globulin, Total: 3.1 g/dL (ref 2.2–3.9)
IgA: 84 mg/dL (ref 61–437)
IgG (Immunoglobin G), Serum: 1578 mg/dL (ref 603–1613)
IgM (Immunoglobulin M), Srm: 87 mg/dL (ref 15–143)
M Protein SerPl Elph-Mcnc: 1 g/dL — ABNORMAL HIGH
Total Protein ELP: 7.1 g/dL (ref 6.0–8.5)

## 2021-01-01 LAB — KAPPA/LAMBDA LIGHT CHAINS
Kappa free light chain: 33.9 mg/L — ABNORMAL HIGH (ref 3.3–19.4)
Kappa, lambda light chain ratio: 2.46 — ABNORMAL HIGH (ref 0.26–1.65)
Lambda free light chains: 13.8 mg/L (ref 5.7–26.3)

## 2021-01-03 ENCOUNTER — Other Ambulatory Visit: Payer: Self-pay | Admitting: *Deleted

## 2021-01-03 ENCOUNTER — Other Ambulatory Visit: Payer: Self-pay

## 2021-01-03 ENCOUNTER — Ambulatory Visit (HOSPITAL_COMMUNITY)
Admission: RE | Admit: 2021-01-03 | Discharge: 2021-01-03 | Disposition: A | Discharge status: Home / Self Care | Payer: Medicare HMO | Source: Ambulatory Visit | Attending: Physician Assistant | Admitting: Physician Assistant

## 2021-01-03 DIAGNOSIS — D472 Monoclonal gammopathy: Secondary | ICD-10-CM | POA: Diagnosis not present

## 2021-01-03 DIAGNOSIS — Z803 Family history of malignant neoplasm of breast: Secondary | ICD-10-CM | POA: Diagnosis not present

## 2021-01-03 DIAGNOSIS — Z806 Family history of leukemia: Secondary | ICD-10-CM | POA: Diagnosis not present

## 2021-01-03 IMAGING — DX DG BONE SURVEY MET
9 of 10 series · 9 of 10 positions shown · non-contrast
Comparison: None.

CLINICAL DATA: Evaluate for lytic lesions. Monoclonal gammopathy of
unknown significance.

EXAM:
METASTATIC BONE SURVEY

[skull lat]
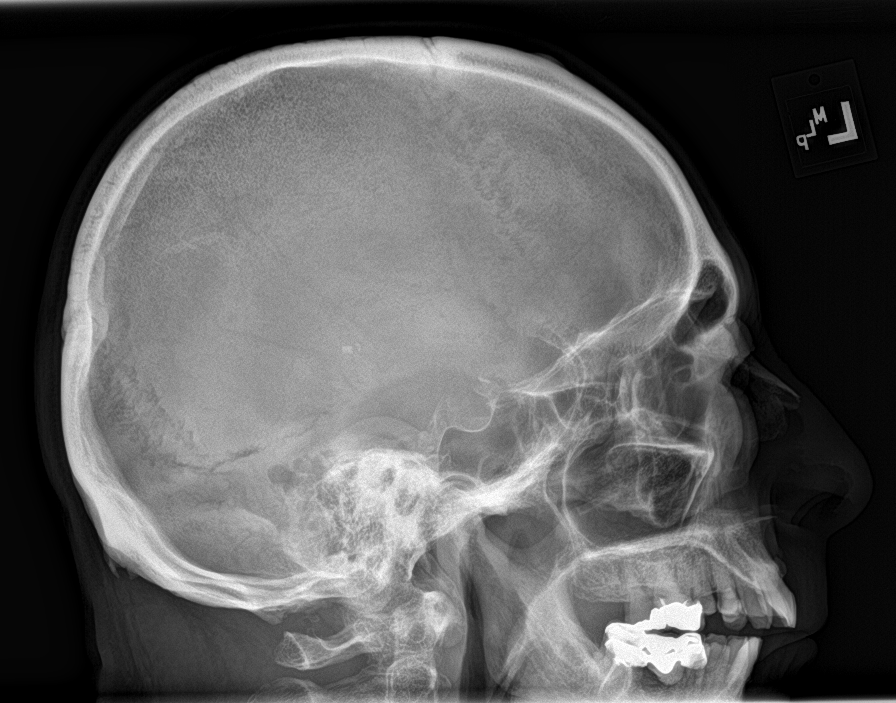

[shoulder ap (1 of 2)]
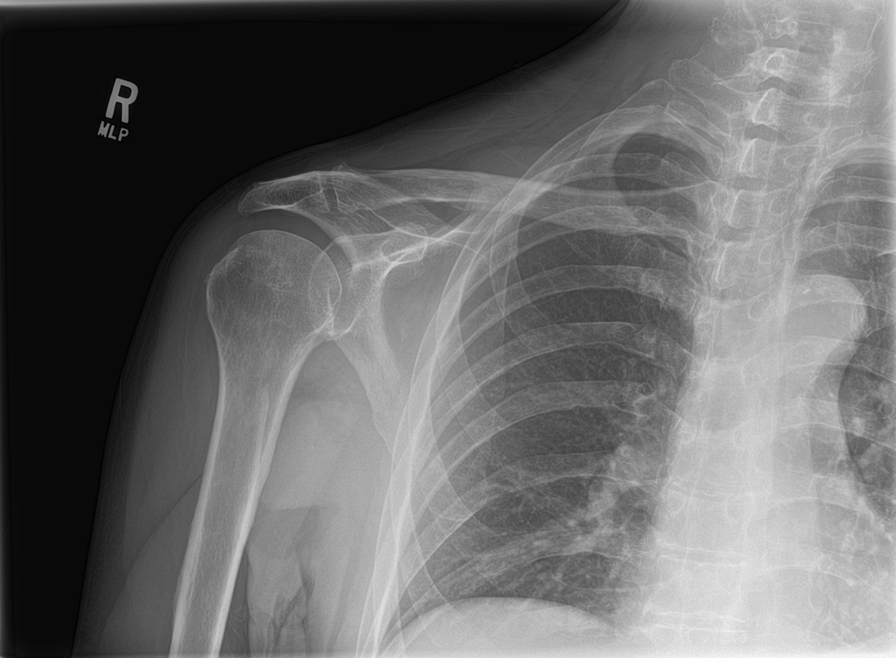

[shoulder ap (2 of 2)]
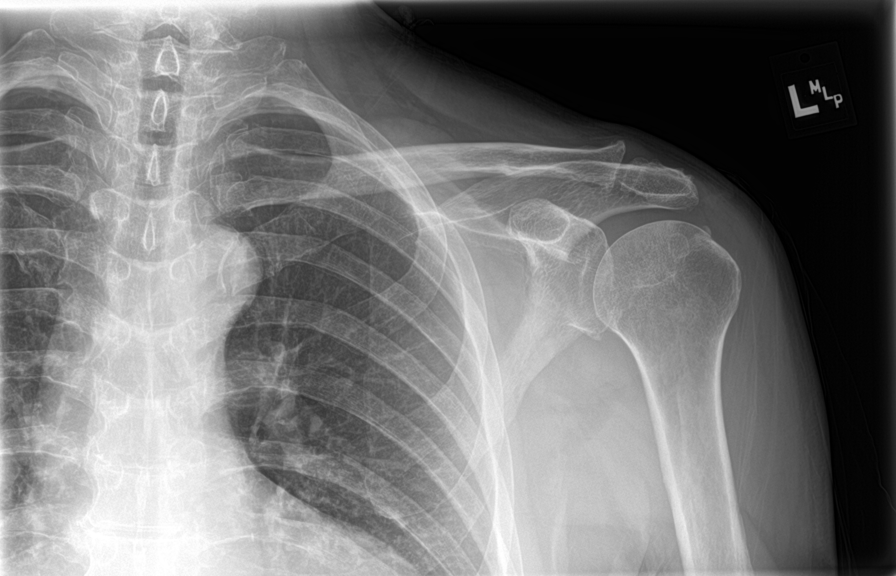

[humerus ap (1 of 2)]
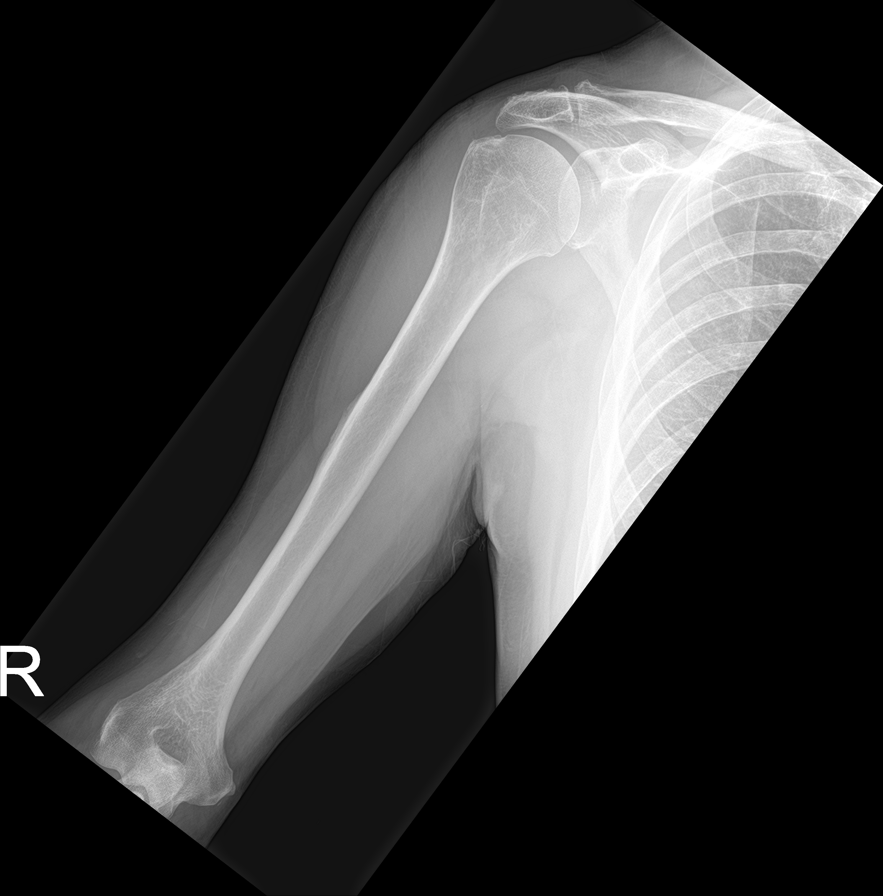

[humerus ap (2 of 2)]
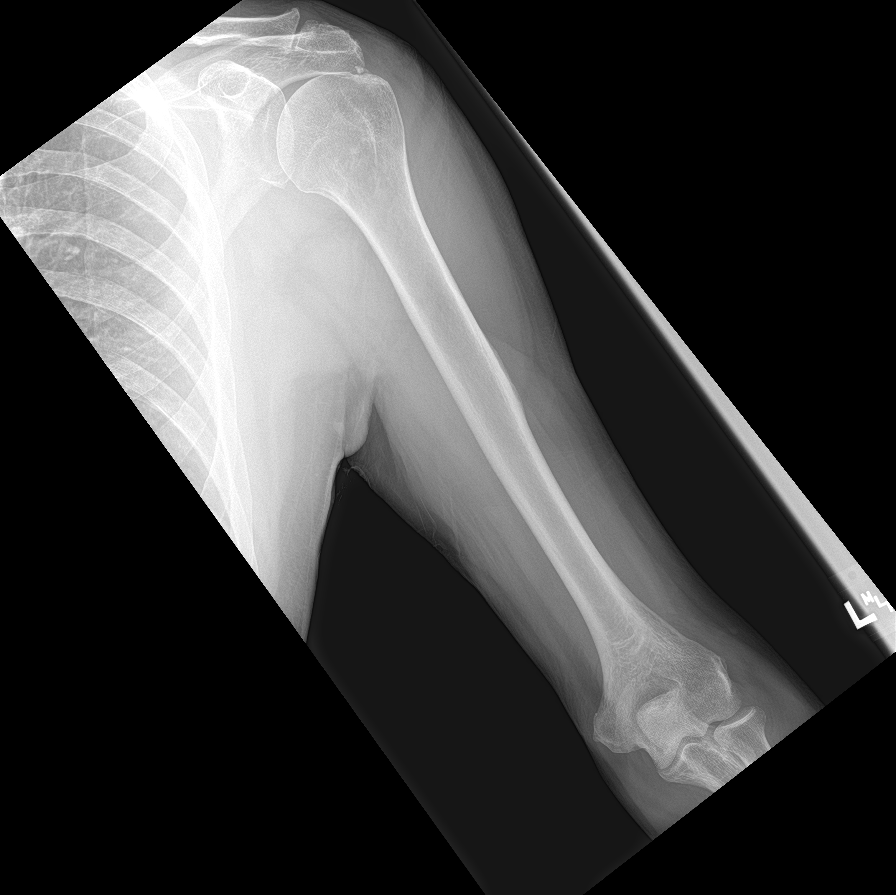

[forearm ap (1 of 2)]
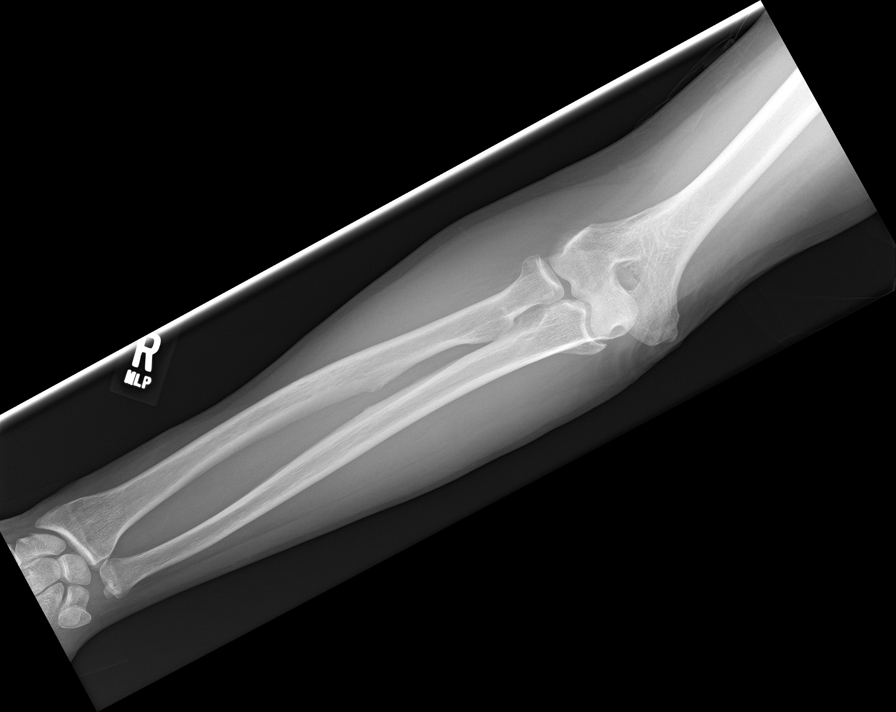

[forearm ap (2 of 2)]
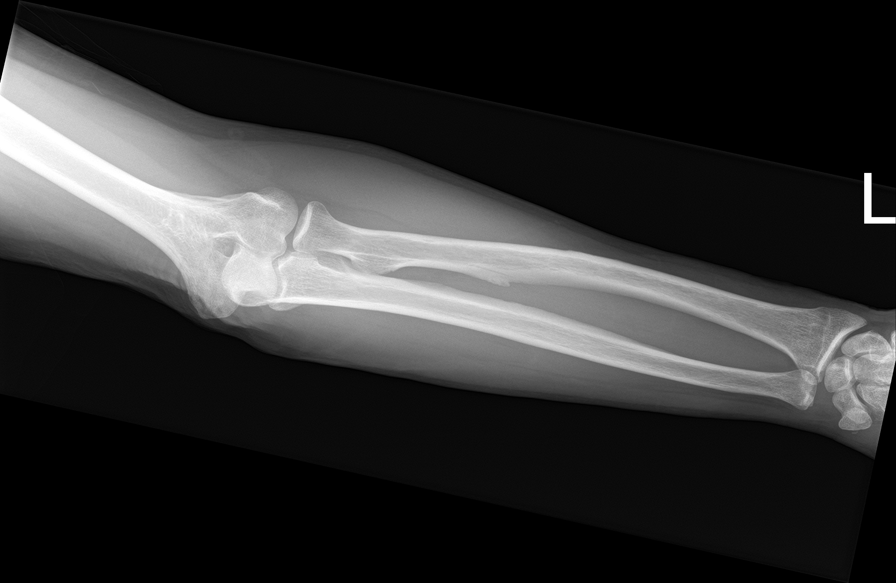

[c-spine ap]
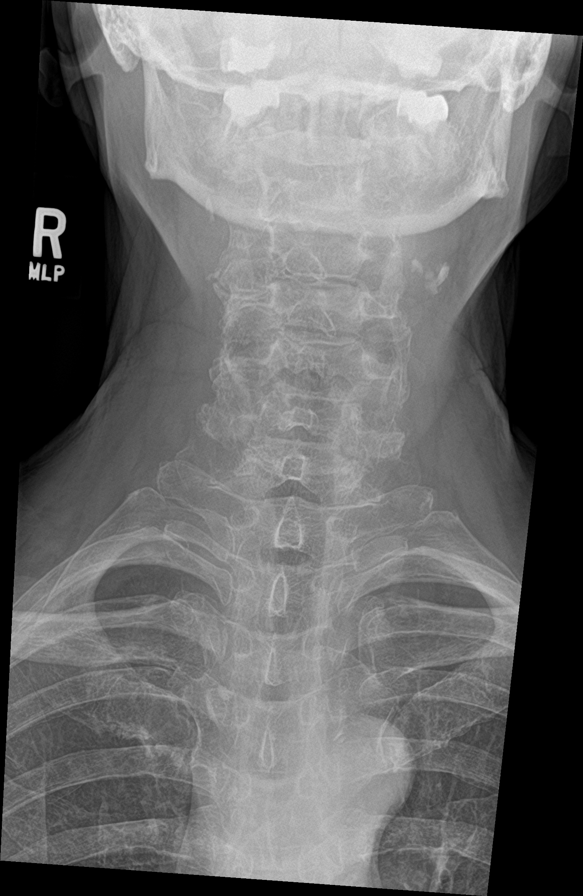

[c-spine lat]
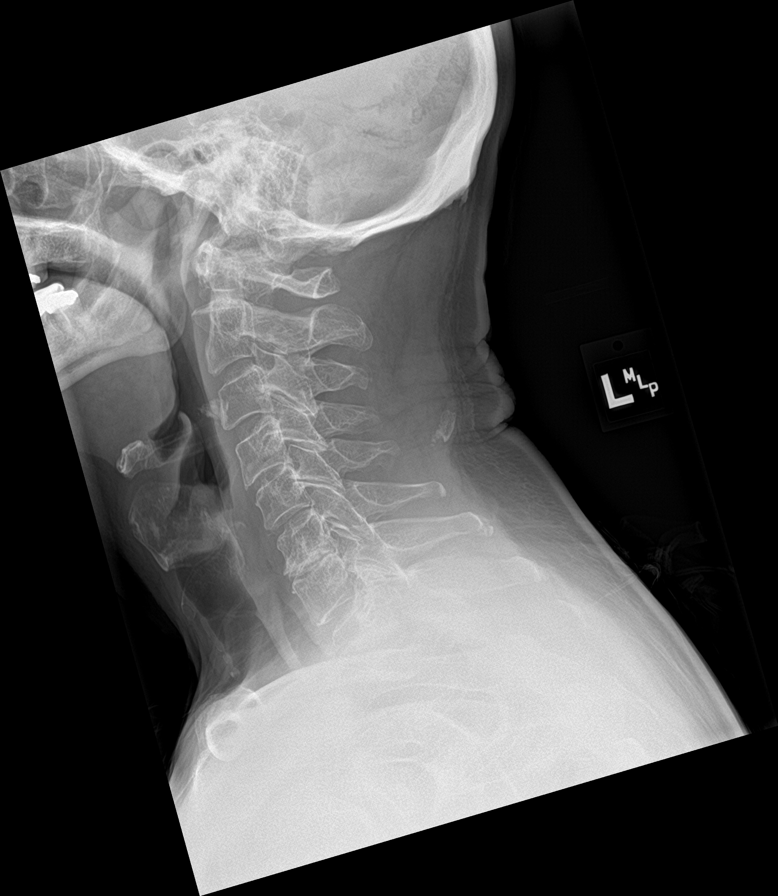

[9 of 10 positions shown; findings below may reference images not displayed]

FINDINGS: No evidence of lytic or blastic osseous lesions throughout the
visualized skeleton. There is multilevel degenerative change in the
spine. No compression fracture. The lungs are clear. Normal bowel
gas pattern.
IMPRESSION: No visualized lytic or focal osseous lesion.

## 2021-01-04 ENCOUNTER — Telehealth: Payer: Self-pay | Admitting: Physician Assistant

## 2021-01-04 LAB — UPEP/UIFE/LIGHT CHAINS/TP, 24-HR UR
% BETA, Urine: 0 %
ALPHA 1 URINE: 0 %
Albumin, U: 100 %
Alpha 2, Urine: 0 %
Free Kappa Lt Chains,Ur: 81.45 mg/L (ref 1.17–86.46)
Free Kappa/Lambda Ratio: 20.99 — ABNORMAL HIGH (ref 1.83–14.26)
Free Lambda Lt Chains,Ur: 3.88 mg/L (ref 0.27–15.21)
GAMMA GLOBULIN URINE: 0 %
Total Protein, Urine-Ur/day: 119 mg/24 hr (ref 30–150)
Total Protein, Urine: 9.9 mg/dL
Total Volume: 1200

## 2021-01-04 NOTE — Telephone Encounter (Signed)
I called and spoke to patient's wife, Patrick Macias, to review the labs from 12/28/2020. SPEP revealed elevated m-protein 1.0 which has increased from 0.7 in April 2022. IFE showed IgG monoclonal protein with kappa light chain specificity. Kappa/Lambda light chain ratio was elevated at 2.46. Bone met survey was negative for lytic lesions. We are still waiting for UPEP results.   Since kappa/lambda light chain ratio is greater than 1.65. We recommend additional evaluation with a bone marrow biopsy. I will request one next week and follow up with the patient afterwards to review results.   Patient's wife and daughter expressed understanding and satisfaction with the plan provided.

## 2021-01-08 DIAGNOSIS — G459 Transient cerebral ischemic attack, unspecified: Secondary | ICD-10-CM | POA: Diagnosis not present

## 2021-01-08 DIAGNOSIS — R69 Illness, unspecified: Secondary | ICD-10-CM | POA: Diagnosis not present

## 2021-01-08 DIAGNOSIS — R6510 Systemic inflammatory response syndrome (SIRS) of non-infectious origin without acute organ dysfunction: Secondary | ICD-10-CM | POA: Diagnosis not present

## 2021-01-09 ENCOUNTER — Telehealth: Payer: Self-pay | Admitting: *Deleted

## 2021-01-09 NOTE — Telephone Encounter (Signed)
Attempting to schedule bone marrow biopsy per request.

## 2021-01-11 ENCOUNTER — Telehealth (HOSPITAL_COMMUNITY): Payer: Self-pay

## 2021-01-11 ENCOUNTER — Other Ambulatory Visit: Payer: Self-pay | Admitting: Physician Assistant

## 2021-01-11 DIAGNOSIS — D472 Monoclonal gammopathy: Secondary | ICD-10-CM

## 2021-01-14 ENCOUNTER — Telehealth (HOSPITAL_COMMUNITY): Payer: Self-pay

## 2021-01-14 ENCOUNTER — Other Ambulatory Visit: Payer: Self-pay | Admitting: *Deleted

## 2021-01-14 NOTE — Progress Notes (Signed)
This RN received call from the patient's wife stating she has " been going round in circles about getting the bone marrow biopsy scheduled "  She states when she spoke with Carrie in Central Scheduling " they were closed for the day and wouldn't be able to schedule "  This RN informed her of system wide computer server outage- there fore central scheduling is unable to process request at this time- but this RN will follow up.  This RN rechecked our availability and was able to obtain available bed for 7/22  This RN called path at [facility] and scheduled with them.  This RN then sent Urgent LOS for scheduling of above on [facility] schedule as well as lab post marrow.  This RN then called the patient's wife- [name] - informed her of date, time, location- and procedure.   

## 2021-01-15 ENCOUNTER — Other Ambulatory Visit: Payer: Self-pay | Admitting: Adult Health

## 2021-01-15 DIAGNOSIS — D472 Monoclonal gammopathy: Secondary | ICD-10-CM

## 2021-01-15 NOTE — Progress Notes (Signed)
INDICATION: MGUS  Brief examination was performed. ENT: adequate airway clearance Heart: regular rate and rhythm.No Murmurs Lungs: clear to auscultation, no wheezes, normal respiratory effort  Bone Marrow Biopsy and Aspiration Procedure Note   Informed consent was obtained and potential risks including bleeding, infection and pain were reviewed with the patient.  The patient's name, date of birth, identification, consent and allergies were verified prior to the start of procedure and time out was performed.  The left posterior iliac crest was chosen as the site of biopsy.  The skin was prepped with ChloraPrep.   8 cc of 2% lidocaine was used to provide local anaesthesia.   10 cc of bone marrow aspirate was obtained followed by 1cm biopsy.  Pressure was applied to the biopsy site and bandage was placed over the biopsy site. Patient was made to lie on the back for 30 mins prior to discharge.  The procedure was tolerated well. COMPLICATIONS: None BLOOD LOSS: none The patient was discharged home in stable condition with a 1 week follow up to review results.  Patient was provided with post bone marrow biopsy instructions and instructed to call if there was any bleeding or worsening pain.  Specimens sent for flow cytometry, cytogenetics and additional studies.  Signed Darryon Bastin C Athony Coppa, NP   

## 2021-01-16 ENCOUNTER — Telehealth: Payer: Self-pay | Admitting: Neurology

## 2021-01-16 NOTE — Telephone Encounter (Signed)
Patient's wife called with concerns about whether or not Patrick Macias's bone marrow biopsy this Friday will affect his dementia or any medications. Please advise.

## 2021-01-16 NOTE — Telephone Encounter (Signed)
Bone marrow biopsies done under local anesthesia, it should not affect his memory

## 2021-01-18 ENCOUNTER — Other Ambulatory Visit: Payer: Self-pay

## 2021-01-18 ENCOUNTER — Inpatient Hospital Stay: Payer: Medicare HMO

## 2021-01-18 ENCOUNTER — Inpatient Hospital Stay (HOSPITAL_BASED_OUTPATIENT_CLINIC_OR_DEPARTMENT_OTHER): Payer: Medicare HMO | Admitting: Adult Health

## 2021-01-18 VITALS — BP 153/73 | HR 57 | Temp 98.7°F | Resp 17

## 2021-01-18 DIAGNOSIS — Z803 Family history of malignant neoplasm of breast: Secondary | ICD-10-CM | POA: Diagnosis not present

## 2021-01-18 DIAGNOSIS — D472 Monoclonal gammopathy: Secondary | ICD-10-CM | POA: Diagnosis not present

## 2021-01-18 DIAGNOSIS — D72822 Plasmacytosis: Secondary | ICD-10-CM | POA: Diagnosis not present

## 2021-01-18 DIAGNOSIS — Z806 Family history of leukemia: Secondary | ICD-10-CM | POA: Diagnosis not present

## 2021-01-18 DIAGNOSIS — D539 Nutritional anemia, unspecified: Secondary | ICD-10-CM | POA: Diagnosis not present

## 2021-01-18 LAB — CBC WITH DIFFERENTIAL (CANCER CENTER ONLY)
Abs Immature Granulocytes: 0.02 10*3/uL (ref 0.00–0.07)
Basophils Absolute: 0 10*3/uL (ref 0.0–0.1)
Basophils Relative: 0 %
Eosinophils Absolute: 0.1 10*3/uL (ref 0.0–0.5)
Eosinophils Relative: 2 %
HCT: 35.3 % — ABNORMAL LOW (ref 39.0–52.0)
Hemoglobin: 12.1 g/dL — ABNORMAL LOW (ref 13.0–17.0)
Immature Granulocytes: 0 %
Lymphocytes Relative: 35 %
Lymphs Abs: 2.2 10*3/uL (ref 0.7–4.0)
MCH: 35.1 pg — ABNORMAL HIGH (ref 26.0–34.0)
MCHC: 34.3 g/dL (ref 30.0–36.0)
MCV: 102.3 fL — ABNORMAL HIGH (ref 80.0–100.0)
Monocytes Absolute: 0.7 10*3/uL (ref 0.1–1.0)
Monocytes Relative: 11 %
Neutro Abs: 3.3 10*3/uL (ref 1.7–7.7)
Neutrophils Relative %: 52 %
Platelet Count: 178 10*3/uL (ref 150–400)
RBC: 3.45 MIL/uL — ABNORMAL LOW (ref 4.22–5.81)
RDW: 13.4 % (ref 11.5–15.5)
WBC Count: 6.3 10*3/uL (ref 4.0–10.5)
nRBC: 0 % (ref 0.0–0.2)

## 2021-01-18 MED ORDER — LIDOCAINE HCL 2 % IJ SOLN
INTRAMUSCULAR | Status: AC
  Filled 2021-01-18: qty 20

## 2021-01-18 NOTE — Patient Instructions (Signed)
Bone Marrow Aspiration and Bone Marrow Biopsy, Adult, Care After This sheet gives you information about how to care for yourself after your procedure. Your health care provider may also give you more specific instructions. If you have problems or questions, contact your health careprovider. What can I expect after the procedure? After the procedure, it is common to have: Mild pain and tenderness. Swelling. Bruising. Follow these instructions at home: Puncture site care  Follow instructions from your health care provider about how to take care of the puncture site. Make sure you: Wash your hands with soap and water before and after you change your bandage (dressing). If soap and water are not available, use hand sanitizer. Change your dressing as told by your health care provider. Check your puncture site every day for signs of infection. Check for: More redness, swelling, or pain. Fluid or blood. Warmth. Pus or a bad smell.  Activity Return to your normal activities as told by your health care provider. Ask your health care provider what activities are safe for you. Do not lift anything that is heavier than 10 lb (4.5 kg), or the limit that you are told, until your health care provider says that it is safe. Do not drive for 24 hours if you were given a sedative during your procedure. General instructions  Take over-the-counter and prescription medicines only as told by your health care provider. Do not take baths, swim, or use a hot tub until your health care provider approves. Ask your health care provider if you may take showers. You may only be allowed to take sponge baths. If directed, put ice on the affected area. To do this: Put ice in a plastic bag. Place a towel between your skin and the bag. Leave the ice on for 20 minutes, 2-3 times a day. Keep all follow-up visits as told by your health care provider. This is important.  Contact a health care provider if: Your pain is not  controlled with medicine. You have a fever. You have more redness, swelling, or pain around the puncture site. You have fluid or blood coming from the puncture site. Your puncture site feels warm to the touch. You have pus or a bad smell coming from the puncture site. Summary After the procedure, it is common to have mild pain, tenderness, swelling, and bruising. Follow instructions from your health care provider about how to take care of the puncture site and what activities are safe for you. Take over-the-counter and prescription medicines only as told by your health care provider. Contact a health care provider if you have any signs of infection, such as fluid or blood coming from the puncture site. This information is not intended to replace advice given to you by your health care provider. Make sure you discuss any questions you have with your healthcare provider. Document Revised: 11/02/2018 Document Reviewed: 11/02/2018 Elsevier Patient Education  2022 [employer]  

## 2021-01-18 NOTE — Progress Notes (Signed)
Patient remained for the 30 min post observation period. VSS, dressing C,D,I. Written instructions provided and reviewed with patient and spouse. Questions answered and they both verbalized understanding of DC instructions. Patient ambulated with his spouse.

## 2021-01-22 ENCOUNTER — Telehealth: Payer: Self-pay | Admitting: *Deleted

## 2021-01-22 NOTE — Telephone Encounter (Signed)
Patients wife called to see if the bone marrow biopsy has resulted.  Advised it hasn't been resulted.

## 2021-01-24 ENCOUNTER — Other Ambulatory Visit: Payer: Medicare HMO

## 2021-01-24 ENCOUNTER — Telehealth: Payer: Self-pay | Admitting: Physician Assistant

## 2021-01-24 LAB — SURGICAL PATHOLOGY

## 2021-01-24 NOTE — Telephone Encounter (Signed)
I called and spoke to [name] and his wife to review the bone marrow biopsy results. 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 Recommendation is to monitor and have patient return in 3 months with repeat labs. Patient and his wife expressed understanding and satisfaction with the plan provided.

## 2021-01-29 ENCOUNTER — Encounter (HOSPITAL_COMMUNITY): Payer: Self-pay | Admitting: Hematology and Oncology

## 2021-01-30 ENCOUNTER — Ambulatory Visit: Payer: Medicare HMO | Admitting: Neurology

## 2021-02-08 DIAGNOSIS — G459 Transient cerebral ischemic attack, unspecified: Secondary | ICD-10-CM | POA: Diagnosis not present

## 2021-02-08 DIAGNOSIS — R6510 Systemic inflammatory response syndrome (SIRS) of non-infectious origin without acute organ dysfunction: Secondary | ICD-10-CM | POA: Diagnosis not present

## 2021-02-08 DIAGNOSIS — R69 Illness, unspecified: Secondary | ICD-10-CM | POA: Diagnosis not present

## 2021-02-11 ENCOUNTER — Encounter: Payer: Self-pay | Admitting: Neurology

## 2021-02-11 ENCOUNTER — Ambulatory Visit: Payer: Medicare HMO | Admitting: Neurology

## 2021-02-11 VITALS — BP 113/59 | HR 60 | Ht 72.0 in | Wt 180.0 lb

## 2021-02-11 DIAGNOSIS — R69 Illness, unspecified: Secondary | ICD-10-CM | POA: Diagnosis not present

## 2021-02-11 DIAGNOSIS — F0390 Unspecified dementia without behavioral disturbance: Secondary | ICD-10-CM | POA: Diagnosis not present

## 2021-02-11 MED ORDER — MEMANTINE HCL 10 MG PO TABS: 10.0000 mg | ORAL_TABLET | Freq: Two times a day (BID) | ORAL | 4 refills | Status: DC

## 2021-02-11 MED ORDER — DONEPEZIL HCL 10 MG PO TABS: 10.0000 mg | ORAL_TABLET | Freq: Every day | ORAL | 11 refills | Status: DC

## 2021-02-11 NOTE — Progress Notes (Signed)
Chief Complaint  Patient presents with   Dementia    Follow up:  Patient for follow up, having trouble sleeping, wants to change medication, also believes patient is having difficulty communicating New Room: Wife Patrick Macias in room    ASSESSMENT AND PLAN  Patrick Macias is a 80 y.o. 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,  Continue Namenda 10 mg twice a day  Add on aricept 74m daily    DIAGNOSTIC DATA (LABS, IMAGING, TESTING) - I reviewed patient records, labs, notes, testing and imaging myself where available  EEG in February 2022 showed mild background slowing  Laboratory evaluations in July 2022: Normal CMP, CBC showed decreased hematocrit, mild anemia, elevated MCH, and MCV, consistent with macrocytic anemia normal LDH,  Bone marrow biopsy January 18, 2021 showed normocellular marrow with mild polytypic plasmacytosis,  HISTORICAL  RMAOR MECKELis a 80year old male, accompanied by his daughter SLarene Macias seen in request by his primary care physician Dr. PCrist Infantefor evaluation of mental status change, memory loss, generalized weakness, initial evaluation was on 08/01/2020,  I reviewed and summarized the referring note.  Past medical history Hyperlipidemia Prostate hypertrophy Anxiety  He has been working for over 40 years, long his own insurance business, his daughter-in-law works with him, he was noted to have gradual onset of memory loss over the past couple years, become obvious since September 2021, daughter recorded Mini-Mental Status Examination by his primary care physician was 24/30, he was noted to have slow reaction time, need long time to finish conversation, often stop in the middle of the sentence.  This has become much more  obvious since his hospital admission from January 1-13, 2022, before hospital admission, he developed hives on different part of his body, he has to take Zyrtec at home, with no help, was not able to sleep well due to itching rash, on the day he was admitted to the hospital on June 30, 2020, he was found to have low-grade fever, significant confusion, difficulty getting out of the bed, slurred speech, paramedic brought him to the emergency room,  Emergency room, fever, temperature 100.5, C-reactive protein 12.6,  CT angiogram of head and neck showed no significant large vessel disease  Personally personally reviewed MRI of the brain on 07/02/2020, compared to previous MRI in December 2021, in September 2021, no acute abnormality, advanced supratentorium, periventricular, right paramedian pons T2 flair signal abnormality, most consistent with small vessel disease,  Laboratory evaluations: CMP, calcium slightly decreased 8.8, glucose was mildly elevated 108, CBC showed hemoglobin of 11.1, ammonia 16, cyclic citrul peptide antibody were negative, hepatitis B surface antigen, HCV antibody, hepatitis a IgM, hepatitis B C IgM were all negative, pro calcitonin was normal, rheumatoid factor was elevated at 30.5, double-stranded DNA was negative, ANA was negative, CMV PCR, EBV DNA PCR, Lyme titer, varicella-zoster PCR, HIV, vitamin B1, vitamin D, RPR, ammonia, BM38 folic acid, TSH, were all normal, A1c was 5.1, ESR was normal 8, negative UDS, the only abnormality was mildly decreased B12 level 221, is on B12 supplement.  CSF study: WBC 1, RBC 2, total protein of 41, glucose of 67, HSV 1/2 PCR negative  Extensive [facility] CSF encephalopathy-autoimmune panels were negative  He also had a hospital admission on December 3-6,  2021, hospital admission for increased confusion, speech difficulty, unsteady on his feet, fidgety,  During today's interview, patient reported he is back to his baseline, wife and  daughter reported that he is 75% back to his baseline, was noted to have significant memory loss, Mini-Mental Status Examination is 23 out of 30, often stopping the middle of the sentence, change subject, slow reaction time,  UPDATE February 11 2021: He is accompanied by his wife at today's visit, his daughter is on the phone, he is closing his business, over the past few months, he was noted to have increased difficulty, difficulty finishing up his symptoms, difficulty following through the conversation, sleeps a lot, he used to be very active, he is no longer motivated, during the day, he watches TV most of the time, take long naps during the day, has good appetite, he can still dress, tolerating, bathing himself, mild unsteady gait   PHYSICAL EXAM   Vitals:   02/11/21 1023  BP: (!) 113/59  Pulse: 60  Weight: 180 lb (81.6 kg)  Height: 6' (1.829 m)   Not recorded     Body mass index is 24.41 kg/m.  PHYSICAL EXAMNIATION:  Gen: NAD, conversant, well nourised, well groomed               MENTAL STATUS: MMSE - Patrick Macias Exam 08/01/2020  Orientation to time 5  Orientation to Place 5  Registration 3  Attention/ Calculation 0  Recall 2  Language- name 2 objects 2  Language- repeat 1  Language- follow 3 step command 3  Language- read & follow direction 1  Write a sentence 1  Copy design 0  Total score 23  animal naming 5.  Montreal Cognitive Assessment  02/11/2021  Visuospatial/ Executive (0/5) 0  Naming (0/3) 3  Attention: Read list of digits (0/2) 0  Attention: Read list of letters (0/1) 0  Attention: Serial 7 subtraction starting at 100 (0/3) 0  Language: Repeat phrase (0/2) 0  Language : Fluency (0/1) 0  Abstraction (0/2) 1  Delayed Recall (0/5) 0  Orientation (0/6) 4  Total 8      CRANIAL NERVES: CN II: Visual fields are full to confrontation. Pupils are round equal and briskly reactive to light. CN III, IV, VI: extraocular movement are normal. No ptosis. CN  V: Facial sensation is intact to light touch CN VII: Face is symmetric with normal eye closure  CN VIII: Hearing is normal to causal conversation. CN IX, X: Phonation is normal. CN XI: Head turning and shoulder shrug are intact  MOTOR: Normal muscle tone, bulk, strength,  REFLEXES: Reflexes are 1 and symmetric at the biceps, triceps, knees, and ankles. Plantar responses are flexor.  SENSORY: Intact to light touch, pinprick and vibratory sensation are intact in fingers and toes.  COORDINATION: There is no trunk or limb dysmetria noted.  GAIT/STANCE: He needs push-up to get up from seated position, cautious, mildly unsteady Romberg is absent.  REVIEW OF SYSTEMS: Full 14 system review of systems performed and notable only for as above. All other review of systems were negative.  ALLERGIES: No Known Allergies  HOME MEDICATIONS: Current Outpatient Medications  Medication Sig Dispense Refill   acetaminophen (TYLENOL) 500 MG tablet Take 1,000 mg by mouth every 6 (six) hours as needed for headache (pain).     aspirin EC 81 MG tablet Take 1 tablet (81 mg total) by mouth daily. 90 tablet 3   bisacodyl (DULCOLAX) 10 MG suppository Place 1 suppository (10  mg total) rectally daily as needed for moderate constipation. 12 suppository 0   cholecalciferol (VITAMIN D3) 25 MCG (1000 UNIT) tablet Take 1,000 Units by mouth daily.     escitalopram (LEXAPRO) 20 MG tablet Take 20 mg by mouth daily.     memantine (NAMENDA) 10 MG tablet Take 1 tablet (10 mg total) by mouth 2 (two) times daily. 60 tablet 11   Multiple Vitamin (MULTIVITAMIN) tablet Take 1 tablet by mouth daily.     rosuvastatin (CRESTOR) 20 MG tablet Take 20 mg by mouth daily.     melatonin 3 MG TABS tablet Take 1 tablet (3 mg total) by mouth at bedtime.  0   naproxen (NAPROSYN) 500 MG tablet Take 1 tablet (500 mg total) by mouth 2 (two) times daily with a meal. 60 tablet 0   pantoprazole (PROTONIX) 40 MG tablet Take 1 tablet (40 mg  total) by mouth 2 (two) times daily. 60 tablet 0   tamsulosin (FLOMAX) 0.4 MG CAPS capsule Take 1 capsule (0.4 mg total) by mouth daily after supper. 30 capsule 0   No current facility-administered medications for this visit.    PAST MEDICAL HISTORY: Past Medical History:  Diagnosis Date   Anxiety    BPH (benign prostatic hyperplasia)    Cerumen impaction    Dyslipidemia    H/O lithotripsy    Hearing decreased, bilateral    Hx of colonic polyps    IFG (impaired fasting glucose)    Macrocytosis    Memory loss    Nephrolithiasis    Vitamin D deficiency     PAST SURGICAL HISTORY: Past Surgical History:  Procedure Laterality Date   CATARACT EXTRACTION, BILATERAL     PROSTATECTOMY     Partial    FAMILY HISTORY: Family History  Problem Relation Age of Onset   Leukemia Mother    Breast cancer Mother    Other Father        unknown medical history   Sudden death Neg Hx    Hypertension Neg Hx    Hyperlipidemia Neg Hx    Diabetes Neg Hx    Heart attack Neg Hx     SOCIAL HISTORY: Social History   Socioeconomic History   Marital status: Married    Spouse name: Patrick Macias   Number of children: 2   Years of education: college   Highest education level: Bachelor's degree (e.g., BA, AB, BS)  Occupational History   Occupation: Insurance   Tobacco Use   Smoking status: Never   Smokeless tobacco: Never  Substance and Sexual Activity   Alcohol use: Not Currently   Drug use: Never   Sexual activity: Not on file  Other Topics Concern   Not on file  Social History Narrative   Lives at home with his wife.   Left-handed.   Two cups caffeine per day.   Social Determinants of Health   Financial Resource Strain: Not on file  Food Insecurity: Not on file  Transportation Needs: Not on file  Physical Activity: Not on file  Stress: Not on file  Social Connections: Not on file  Intimate Partner Violence: Not on file     Marcial Pacas, M.D. Ph.D.  [facility] [address], [place], [place] [address] Ph: [phone] Fax: [phone]  CC:  Crist Infante, [place],  [place] [address]

## 2021-03-05 ENCOUNTER — Telehealth: Payer: Self-pay | Admitting: Neurology

## 2021-03-05 ENCOUNTER — Encounter: Payer: Self-pay | Admitting: *Deleted

## 2021-03-05 ENCOUNTER — Other Ambulatory Visit: Payer: Self-pay | Admitting: *Deleted

## 2021-03-05 NOTE — Telephone Encounter (Signed)
Pt's wife, Sailor [place] (on [place]) called, since starting  donepezil (ARICEPT) 10 MG tablet and taking at night,he has bad dreams, not sleeping. Would like a call from the nurse to discuss switching to morning.

## 2021-03-05 NOTE — Telephone Encounter (Signed)
I called the patient's wife back. She is going to try to move his donepezil dosage to the morning to see if this will helps with the side effects. She will keep our office updated.  He is also taking both generic Lexapro '20mg'$  and generic Wellbutrin '150mg'$ , both in the morning. These are prescribed by his PCP. She is going to contact that office to get clarification of these medications (should he take both, should he take one in am and other in pm).

## 2021-03-11 DIAGNOSIS — R69 Illness, unspecified: Secondary | ICD-10-CM | POA: Diagnosis not present

## 2021-03-11 DIAGNOSIS — F0390 Unspecified dementia without behavioral disturbance: Secondary | ICD-10-CM | POA: Diagnosis not present

## 2021-03-11 DIAGNOSIS — R6510 Systemic inflammatory response syndrome (SIRS) of non-infectious origin without acute organ dysfunction: Secondary | ICD-10-CM | POA: Diagnosis not present

## 2021-03-11 DIAGNOSIS — G459 Transient cerebral ischemic attack, unspecified: Secondary | ICD-10-CM | POA: Diagnosis not present

## 2021-03-13 DIAGNOSIS — D225 Melanocytic nevi of trunk: Secondary | ICD-10-CM | POA: Diagnosis not present

## 2021-03-13 DIAGNOSIS — L820 Inflamed seborrheic keratosis: Secondary | ICD-10-CM | POA: Diagnosis not present

## 2021-03-13 DIAGNOSIS — X32XXXD Exposure to sunlight, subsequent encounter: Secondary | ICD-10-CM | POA: Diagnosis not present

## 2021-03-13 DIAGNOSIS — L821 Other seborrheic keratosis: Secondary | ICD-10-CM | POA: Diagnosis not present

## 2021-03-13 DIAGNOSIS — L570 Actinic keratosis: Secondary | ICD-10-CM | POA: Diagnosis not present

## 2021-03-13 DIAGNOSIS — Z1283 Encounter for screening for malignant neoplasm of skin: Secondary | ICD-10-CM | POA: Diagnosis not present

## 2021-04-02 ENCOUNTER — Inpatient Hospital Stay: Payer: Medicare HMO | Attending: Physician Assistant | Admitting: Physician Assistant

## 2021-04-02 ENCOUNTER — Inpatient Hospital Stay: Payer: Medicare HMO

## 2021-04-02 ENCOUNTER — Other Ambulatory Visit: Payer: Self-pay

## 2021-04-02 ENCOUNTER — Other Ambulatory Visit: Payer: Self-pay | Admitting: Physician Assistant

## 2021-04-02 VITALS — BP 127/69 | HR 58 | Temp 97.0°F | Resp 18 | Wt 181.3 lb

## 2021-04-02 DIAGNOSIS — Z803 Family history of malignant neoplasm of breast: Secondary | ICD-10-CM | POA: Insufficient documentation

## 2021-04-02 DIAGNOSIS — Z806 Family history of leukemia: Secondary | ICD-10-CM | POA: Insufficient documentation

## 2021-04-02 DIAGNOSIS — D539 Nutritional anemia, unspecified: Secondary | ICD-10-CM | POA: Diagnosis not present

## 2021-04-02 DIAGNOSIS — D472 Monoclonal gammopathy: Secondary | ICD-10-CM

## 2021-04-02 DIAGNOSIS — D72822 Plasmacytosis: Secondary | ICD-10-CM | POA: Insufficient documentation

## 2021-04-02 DIAGNOSIS — Z9079 Acquired absence of other genital organ(s): Secondary | ICD-10-CM | POA: Insufficient documentation

## 2021-04-02 LAB — CMP (CANCER CENTER ONLY)
ALT: 36 U/L (ref 0–44)
AST: 39 U/L (ref 15–41)
Albumin: 4.3 g/dL (ref 3.5–5.0)
Alkaline Phosphatase: 52 U/L (ref 38–126)
Anion gap: 7 (ref 5–15)
BUN: 17 mg/dL (ref 8–23)
CO2: 25 mmol/L (ref 22–32)
Calcium: 9.1 mg/dL (ref 8.9–10.3)
Chloride: 106 mmol/L (ref 98–111)
Creatinine: 1.06 mg/dL (ref 0.61–1.24)
GFR, Estimated: >60 mL/min (ref >60)
Glucose, Bld: 96 mg/dL (ref 70–99)
Potassium: 4.1 mmol/L (ref 3.5–5.1)
Sodium: 138 mmol/L (ref 135–145)
Total Bilirubin: 1.3 mg/dL — ABNORMAL HIGH (ref 0.3–1.2)
Total Protein: 7.3 g/dL (ref 6.5–8.1)

## 2021-04-02 LAB — CBC WITH DIFFERENTIAL (CANCER CENTER ONLY)
Abs Immature Granulocytes: 0.01 10*3/uL (ref 0.00–0.07)
Basophils Absolute: 0 10*3/uL (ref 0.0–0.1)
Basophils Relative: 0 %
Eosinophils Absolute: 0.1 10*3/uL (ref 0.0–0.5)
Eosinophils Relative: 2 %
HCT: 33.4 % — ABNORMAL LOW (ref 39.0–52.0)
Hemoglobin: 11.5 g/dL — ABNORMAL LOW (ref 13.0–17.0)
Immature Granulocytes: 0 %
Lymphocytes Relative: 37 %
Lymphs Abs: 1.7 10*3/uL (ref 0.7–4.0)
MCH: 35.7 pg — ABNORMAL HIGH (ref 26.0–34.0)
MCHC: 34.4 g/dL (ref 30.0–36.0)
MCV: 103.7 fL — ABNORMAL HIGH (ref 80.0–100.0)
Monocytes Absolute: 0.5 10*3/uL (ref 0.1–1.0)
Monocytes Relative: 10 %
Neutro Abs: 2.3 10*3/uL (ref 1.7–7.7)
Neutrophils Relative %: 51 %
Platelet Count: 237 10*3/uL (ref 150–400)
RBC: 3.22 MIL/uL — ABNORMAL LOW (ref 4.22–5.81)
RDW: 14 % (ref 11.5–15.5)
WBC Count: 4.6 10*3/uL (ref 4.0–10.5)
nRBC: 0 % (ref 0.0–0.2)

## 2021-04-02 LAB — VITAMIN B12: Vitamin B-12: 377 pg/mL (ref 180–914)

## 2021-04-02 LAB — FOLATE: Folate: 21.1 ng/mL (ref >5.9)

## 2021-04-02 NOTE — Progress Notes (Signed)
[facility] [phone]   [phone]  PROGRESS NOTE  Patient Care Team: Rodrigo Ran, MD as PCP - General (Internal Medicine) Jaci Standard, MD as Consulting Physician (Hematology and Oncology)  Hematological/Oncological History 1) Labs from PCP, Dr. Myer Haff from [facility].  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   2) 12/28/2020: Establish care with Georga Kaufmann PA-C  3) 01/18/2021: Bone marrow biopsy: Normocellular marrow with mild polytypic plasmocytosis.   CHIEF COMPLAINTS/PURPOSE OF CONSULTATION:  "Monoclonal gammopathy of unknown significance "  HISTORY OF PRESENTING ILLNESS:  Patrick Macias 80 y.o. male returns today for a follow up for monoclonal gammopathy.  Patient is accompanied by his wife for this visit.  On exam today, Mr. Dowis i reports improvement of his overall energy levels.  His wife notes that his cognition and memory has improved since adding Wellbutrin and Aricept.  He is now able to drive and is more independent.  He has a good appetite and denies any recent weight loss.  Patient denies any nausea, vomiting or abdominal pain.  His bowel movements are regular without any diarrhea or constipation.  He denies easy bruising or signs of bleeding.  He denies any fevers, chills, night sweats, shortness of breath, chest pain, cough, bone pain, rash, headaches, dizziness or syncopal episodes. He has no other complaints.  Rest of the 10 point ROS is below.  MEDICAL HISTORY:  Past Medical History:  Diagnosis Date   Anxiety    BPH (benign prostatic hyperplasia)    Cerumen impaction    Dyslipidemia    H/O lithotripsy    Hearing decreased, bilateral    Hx of colonic polyps    IFG (impaired fasting glucose)    Macrocytosis    Memory loss    Nephrolithiasis    Vitamin D deficiency     SURGICAL HISTORY: Past Surgical History:   Procedure Laterality Date   CATARACT EXTRACTION, BILATERAL     PROSTATECTOMY     Partial    SOCIAL HISTORY: Social History   Socioeconomic History   Marital status: Married    Spouse name: Harriett Sine   Number of children: 2   Years of education: college   Highest education level: Bachelor's degree (e.g., BA, AB, BS)  Occupational History   Occupation: Insurance   Tobacco Use   Smoking status: Never   Smokeless tobacco: Never  Substance and Sexual Activity   Alcohol use: Not Currently   Drug use: Never   Sexual activity: Not on file  Other Topics Concern   Not on file  Social History Narrative   Lives at home with his wife.   Left-handed.   Two cups caffeine per day.   Social Determinants of Health   Financial Resource Strain: Not on file  Food Insecurity: Not on file  Transportation Needs: Not on file  Physical Activity: Not on file  Stress: Not on file  Social Connections: Not on file  Intimate Partner Violence: Not on file    FAMILY HISTORY: Family History  Problem Relation Age of Onset   Leukemia Mother    Breast cancer Mother    Other Father        unknown medical history   Sudden death Neg Hx    Hypertension Neg Hx    Hyperlipidemia Neg Hx    Diabetes Neg Hx    Heart attack Neg Hx     ALLERGIES:  has No Known Allergies.  MEDICATIONS:  Current Outpatient Medications  Medication Sig Dispense Refill   acetaminophen (TYLENOL) 500 MG tablet Take 1,000 mg by mouth every 6 (six) hours as needed for headache (pain).     aspirin EC 81 MG tablet Take 1 tablet (81 mg total) by mouth daily. 90 tablet 3   bisacodyl (DULCOLAX) 10 MG suppository Place 1 suppository (10 mg total) rectally daily as needed for moderate constipation. 12 suppository 0   buPROPion (WELLBUTRIN XL) 150 MG 24 hr tablet Take 150 mg by mouth every morning.     cholecalciferol (VITAMIN D3) 25 MCG (1000 UNIT) tablet Take 1,000 Units by mouth daily.     donepezil (ARICEPT) 10 MG tablet Take 1  tablet (10 mg total) by mouth at bedtime. 30 tablet 11   escitalopram (LEXAPRO) 20 MG tablet Take 20 mg by mouth every morning.     memantine (NAMENDA) 10 MG tablet Take 1 tablet (10 mg total) by mouth 2 (two) times daily. 180 tablet 4   Multiple Vitamin (MULTIVITAMIN) tablet Take 1 tablet by mouth daily.     rosuvastatin (CRESTOR) 20 MG tablet Take 20 mg by mouth daily.     No current facility-administered medications for this visit.    REVIEW OF SYSTEMS:   Constitutional: ( - ) fevers, ( - )  chills , ( - ) night sweats Eyes: ( - ) blurriness of vision, ( - ) double vision, ( - ) watery eyes Ears, nose, mouth, throat, and face: ( - ) mucositis, ( - ) sore throat Respiratory: ( - ) cough, ( - ) dyspnea, ( - ) wheezes Cardiovascular: ( - ) palpitation, ( - ) chest discomfort, ( - ) lower extremity swelling Gastrointestinal:  ( - ) nausea, ( - ) heartburn, ( - ) change in bowel habits Skin: ( - ) abnormal skin rashes Lymphatics: ( - ) new lymphadenopathy, ( - ) easy bruising Neurological: ( - ) numbness, ( - ) tingling, ( - ) new weaknesses Behavioral/Psych: ( - ) mood change, ( - ) new changes  All other systems were reviewed with the patient and are negative.  PHYSICAL EXAMINATION: ECOG PERFORMANCE STATUS: 0 - Asymptomatic  Vitals:   04/02/21 1040  BP: 127/69  Pulse: (!) 58  Resp: 18  Temp: (!) 97 F (36.1 C)  SpO2: 100%   Filed Weights   04/02/21 1040  Weight: 181 lb 5 oz (82.2 kg)    GENERAL: well appearing male in NAD  SKIN: skin color, texture, turgor are normal, no rashes or significant lesions EYES: conjunctiva are pink and non-injected, sclera clear OROPHARYNX: no exudate, no erythema; lips, buccal mucosa, and tongue normal  LYMPH:  no palpable lymphadenopathy in the cervical or supraclavicular lymph nodes.  LUNGS: clear to auscultation and percussion with normal breathing effort HEART: regular rate & rhythm and no murmurs and no lower extremity edema ABDOMEN:  soft, non-tender, non-distended, normal bowel sounds Musculoskeletal: no cyanosis of digits and no clubbing  PSYCH: alert & oriented x 3, fluent speech NEURO: no focal motor/sensory deficits  LABORATORY DATA:  I have reviewed the data as listed CBC Latest Ref Rng & Units 04/02/2021 01/18/2021 12/28/2020  WBC 4.0 - 10.5 K/uL 4.6 6.3 5.6  Hemoglobin 13.0 - 17.0 g/dL 11.5(L) 12.1(L) 12.6(L)  Hematocrit 39.0 - 52.0 % 33.4(L) 35.3(L) 37.1(L)  Platelets 150 - 400 K/uL 237 178 206    CMP Latest Ref Rng & Units 04/02/2021 12/28/2020 07/11/2020  Glucose  70 - 99 mg/dL 96 90 108(H)  BUN 8 - 23 mg/dL 17 24(H) 25(H)  Creatinine 0.61 - 1.24 mg/dL 1.06 1.14 0.91  Sodium 135 - 145 mmol/L 138 139 140  Potassium 3.5 - 5.1 mmol/L 4.1 4.2 3.6  Chloride 98 - 111 mmol/L 106 103 107  CO2 22 - 32 mmol/L _0 Calcium 8.9 - 10.3 mg/dL 9.1 9.6 8.8(L)  Total Protein 6.5 - 8.1 g/dL 7.3 7.7 -  Total Bilirubin 0.3 - 1.2 mg/dL 1.3(H) 1.1 -  Alkaline Phos 38 - 126 U/L 52 47 -  AST 15 - 41 U/L 39 21 -  ALT 0 - 44 U/L 36 17 -   ASSESSMENT & PLAN KORIE BRABSON  is a 80 y.o. male returns for a follow up for monoclonal gammopathy.    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  IFE showed IgG monoclonal protein with kappa light chain specificity. Kappa/Lambda light chain ratio was elevated at 2.46. UPEP showed no m-protein but positive bence jones proteins, kappa type.  --DG bone met survey from 01/03/2021 showed no evidence of lytic lesions.  --Bone marrow biopsy was recommended due to elevated serum free light chain ration of 2.46. Patient underwent biopsy on 01/18/2021. Pathology revealed normocellular marrow with mild polytypic plasmocytosis. Findings consistent with MGUS.  -Labs from today show overall stable anemia. CMP shows normal creatinine, calcium levels. Pending SPEP with IFE, serum free light chains, UPEP. I will call patient with results once available.  -RTC in 6 months unless pending  labs require further workup.  #Macrocytic anemia: --Hgb is 11.5, MCV 103.7. Overall stable in the last three months.  --Checking vitamin B12, folate, and methylmalonic levels today to evaluate for nutritional deficiencies.    No orders of the defined types were placed in this encounter.   All questions were answered. The patient knows to call the clinic with any problems, questions or concerns.  I have spent a total of 25 minutes minutes of face-to-face and non-face-to-face time, preparing to see the patient, performing a medically appropriate examination, counseling and educating the patient, ordering tests, documenting clinical information in the electronic health record, communicating results to the patient, and care coordination.    Dede Query, PA-C Department of Hematology/Oncology [place] at [facility] Phone: [phone]

## 2021-04-03 LAB — KAPPA/LAMBDA LIGHT CHAINS
Kappa free light chain: 35 mg/L — ABNORMAL HIGH (ref 3.3–19.4)
Kappa, lambda light chain ratio: 3.18 — ABNORMAL HIGH (ref 0.26–1.65)
Lambda free light chains: 11 mg/L (ref 5.7–26.3)

## 2021-04-03 LAB — METHYLMALONIC ACID, SERUM: Methylmalonic Acid, Quantitative: 122 nmol/L (ref 0–378)

## 2021-04-04 LAB — MULTIPLE MYELOMA PANEL, SERUM
Albumin SerPl Elph-Mcnc: 3.9 g/dL (ref 2.9–4.4)
Albumin/Glob SerPl: 1.4 (ref 0.7–1.7)
Alpha 1: 0.2 g/dL (ref 0.0–0.4)
Alpha2 Glob SerPl Elph-Mcnc: 0.6 g/dL (ref 0.4–1.0)
B-Globulin SerPl Elph-Mcnc: 0.8 g/dL (ref 0.7–1.3)
Gamma Glob SerPl Elph-Mcnc: 1.5 g/dL (ref 0.4–1.8)
Globulin, Total: 3 g/dL (ref 2.2–3.9)
IgA: 70 mg/dL (ref 61–437)
IgG (Immunoglobin G), Serum: 1387 mg/dL (ref 603–1613)
IgM (Immunoglobulin M), Srm: 81 mg/dL (ref 15–143)
M Protein SerPl Elph-Mcnc: 0.8 g/dL — ABNORMAL HIGH
Total Protein ELP: 6.9 g/dL (ref 6.0–8.5)

## 2021-04-09 DIAGNOSIS — Z9079 Acquired absence of other genital organ(s): Secondary | ICD-10-CM | POA: Diagnosis not present

## 2021-04-09 DIAGNOSIS — H9193 Unspecified hearing loss, bilateral: Secondary | ICD-10-CM | POA: Diagnosis not present

## 2021-04-09 DIAGNOSIS — E785 Hyperlipidemia, unspecified: Secondary | ICD-10-CM | POA: Diagnosis not present

## 2021-04-09 DIAGNOSIS — Z806 Family history of leukemia: Secondary | ICD-10-CM | POA: Diagnosis not present

## 2021-04-09 DIAGNOSIS — H6123 Impacted cerumen, bilateral: Secondary | ICD-10-CM | POA: Diagnosis not present

## 2021-04-09 DIAGNOSIS — D539 Nutritional anemia, unspecified: Secondary | ICD-10-CM | POA: Diagnosis not present

## 2021-04-09 DIAGNOSIS — E559 Vitamin D deficiency, unspecified: Secondary | ICD-10-CM | POA: Diagnosis not present

## 2021-04-09 DIAGNOSIS — D472 Monoclonal gammopathy: Secondary | ICD-10-CM | POA: Diagnosis not present

## 2021-04-09 DIAGNOSIS — Z0000 Encounter for general adult medical examination without abnormal findings: Secondary | ICD-10-CM | POA: Diagnosis not present

## 2021-04-09 DIAGNOSIS — I10 Essential (primary) hypertension: Secondary | ICD-10-CM | POA: Diagnosis not present

## 2021-04-09 DIAGNOSIS — Z803 Family history of malignant neoplasm of breast: Secondary | ICD-10-CM | POA: Diagnosis not present

## 2021-04-09 DIAGNOSIS — E538 Deficiency of other specified B group vitamins: Secondary | ICD-10-CM | POA: Diagnosis not present

## 2021-04-09 DIAGNOSIS — Z125 Encounter for screening for malignant neoplasm of prostate: Secondary | ICD-10-CM | POA: Diagnosis not present

## 2021-04-09 DIAGNOSIS — E041 Nontoxic single thyroid nodule: Secondary | ICD-10-CM | POA: Diagnosis not present

## 2021-04-09 DIAGNOSIS — D72822 Plasmacytosis: Secondary | ICD-10-CM | POA: Diagnosis not present

## 2021-04-09 DIAGNOSIS — R7301 Impaired fasting glucose: Secondary | ICD-10-CM | POA: Diagnosis not present

## 2021-04-10 ENCOUNTER — Telehealth: Payer: Self-pay | Admitting: Physician Assistant

## 2021-04-10 DIAGNOSIS — R6510 Systemic inflammatory response syndrome (SIRS) of non-infectious origin without acute organ dysfunction: Secondary | ICD-10-CM | POA: Diagnosis not present

## 2021-04-10 DIAGNOSIS — F0390 Unspecified dementia without behavioral disturbance: Secondary | ICD-10-CM | POA: Diagnosis not present

## 2021-04-10 DIAGNOSIS — G459 Transient cerebral ischemic attack, unspecified: Secondary | ICD-10-CM | POA: Diagnosis not present

## 2021-04-10 DIAGNOSIS — R69 Illness, unspecified: Secondary | ICD-10-CM | POA: Diagnosis not present

## 2021-04-10 NOTE — Telephone Encounter (Signed)
I spoke to patient's wife, Ms. Wess Botts, to review the lab results from 04/02/2021. CBC shows anemia is fairly stable. CMP showed normal creatinine and calcium levels. M protein and serum free light chains are overall stable. Vitamin B12 and folate levels are normal. The UPEP results are pending. Plan will be to see patient back in 6 months with repeat labs. Patient's wife expressed understanding of the plan provided.

## 2021-04-12 LAB — UPEP/UIFE/LIGHT CHAINS/TP, 24-HR UR
% BETA, Urine: 23.5 %
ALPHA 1 URINE: 8 %
Albumin, U: 35.4 %
Alpha 2, Urine: 15.9 %
Free Kappa Lt Chains,Ur: 95.61 mg/L — ABNORMAL HIGH (ref 1.17–86.46)
Free Kappa/Lambda Ratio: 11.6 (ref 1.83–14.26)
Free Lambda Lt Chains,Ur: 8.24 mg/L (ref 0.27–15.21)
GAMMA GLOBULIN URINE: 17.3 %
Total Protein, Urine-Ur/day: 92 mg/24 hr (ref 30–150)
Total Protein, Urine: 14.1 mg/dL
Total Volume: 650

## 2021-04-16 DIAGNOSIS — Z0000 Encounter for general adult medical examination without abnormal findings: Secondary | ICD-10-CM | POA: Diagnosis not present

## 2021-04-16 DIAGNOSIS — Z23 Encounter for immunization: Secondary | ICD-10-CM | POA: Diagnosis not present

## 2021-04-16 DIAGNOSIS — D472 Monoclonal gammopathy: Secondary | ICD-10-CM | POA: Diagnosis not present

## 2021-04-16 DIAGNOSIS — M25561 Pain in right knee: Secondary | ICD-10-CM | POA: Diagnosis not present

## 2021-04-16 DIAGNOSIS — I6381 Other cerebral infarction due to occlusion or stenosis of small artery: Secondary | ICD-10-CM | POA: Diagnosis not present

## 2021-04-16 DIAGNOSIS — Z1331 Encounter for screening for depression: Secondary | ICD-10-CM | POA: Diagnosis not present

## 2021-04-16 DIAGNOSIS — I2510 Atherosclerotic heart disease of native coronary artery without angina pectoris: Secondary | ICD-10-CM | POA: Diagnosis not present

## 2021-04-16 DIAGNOSIS — I700 Atherosclerosis of aorta: Secondary | ICD-10-CM | POA: Diagnosis not present

## 2021-04-16 DIAGNOSIS — E041 Nontoxic single thyroid nodule: Secondary | ICD-10-CM | POA: Diagnosis not present

## 2021-04-16 DIAGNOSIS — Z1339 Encounter for screening examination for other mental health and behavioral disorders: Secondary | ICD-10-CM | POA: Diagnosis not present

## 2021-04-16 DIAGNOSIS — G459 Transient cerebral ischemic attack, unspecified: Secondary | ICD-10-CM | POA: Diagnosis not present

## 2021-04-16 DIAGNOSIS — I6990 Unspecified sequelae of unspecified cerebrovascular disease: Secondary | ICD-10-CM | POA: Diagnosis not present

## 2021-04-16 DIAGNOSIS — R9431 Abnormal electrocardiogram [ECG] [EKG]: Secondary | ICD-10-CM | POA: Diagnosis not present

## 2021-04-16 DIAGNOSIS — R7301 Impaired fasting glucose: Secondary | ICD-10-CM | POA: Diagnosis not present

## 2021-04-18 ENCOUNTER — Other Ambulatory Visit: Payer: Self-pay | Admitting: Internal Medicine

## 2021-04-18 DIAGNOSIS — E041 Nontoxic single thyroid nodule: Secondary | ICD-10-CM

## 2021-05-01 ENCOUNTER — Ambulatory Visit
Admission: RE | Admit: 2021-05-01 | Discharge: 2021-05-01 | Disposition: A | Discharge status: Home / Self Care | Payer: Medicare HMO | Source: Ambulatory Visit | Attending: Internal Medicine | Admitting: Internal Medicine

## 2021-05-01 DIAGNOSIS — E041 Nontoxic single thyroid nodule: Secondary | ICD-10-CM | POA: Diagnosis not present

## 2021-05-01 IMAGING — US US THYROID
1 series · 13 of 25 positions shown · non-contrast
Comparison: CT a of the neck on [DATE]

CLINICAL DATA: Incidental on CT. Left thyroid nodule detected by
prior CTA of the neck.

EXAM:
THYROID ULTRASOUND
TECHNIQUE: Ultrasound examination of the thyroid gland and adjacent soft
tissues was performed.

[Series 1: us thyroid · 0.06mm/px · 13 of 53 slices shown]
[im 1/53]
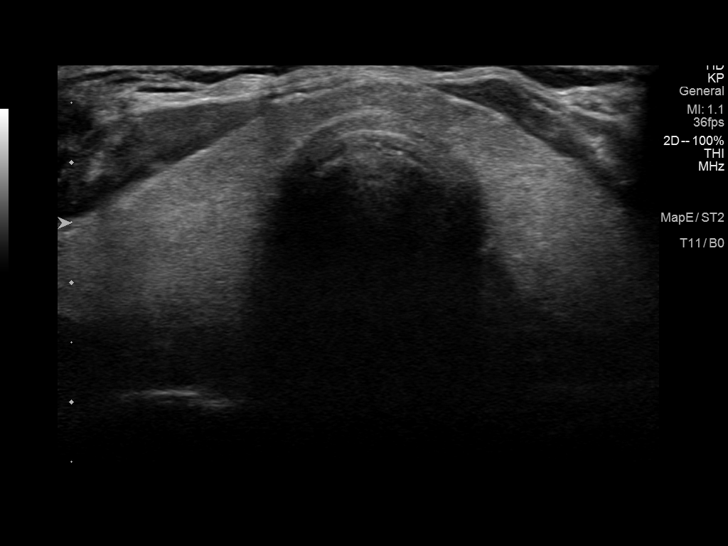
[im 5/53]
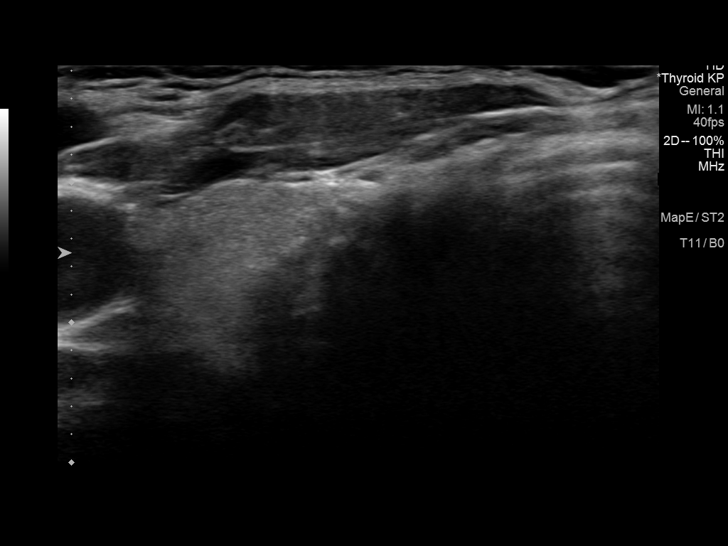
[im 9/53]
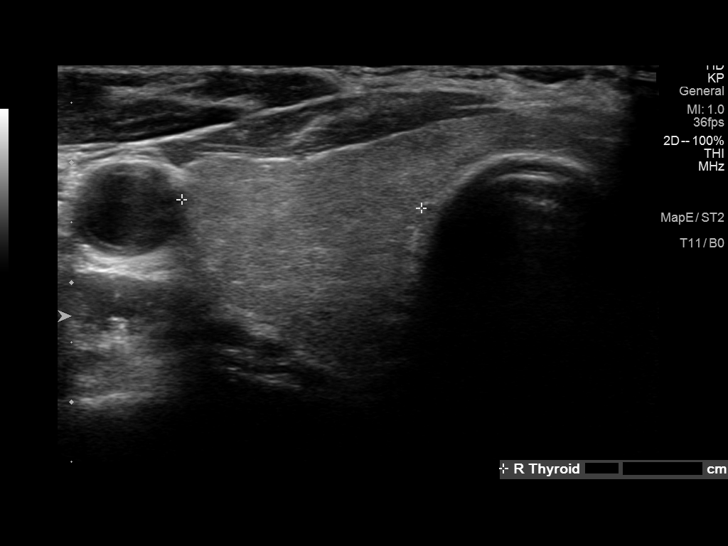
[im 14/53]
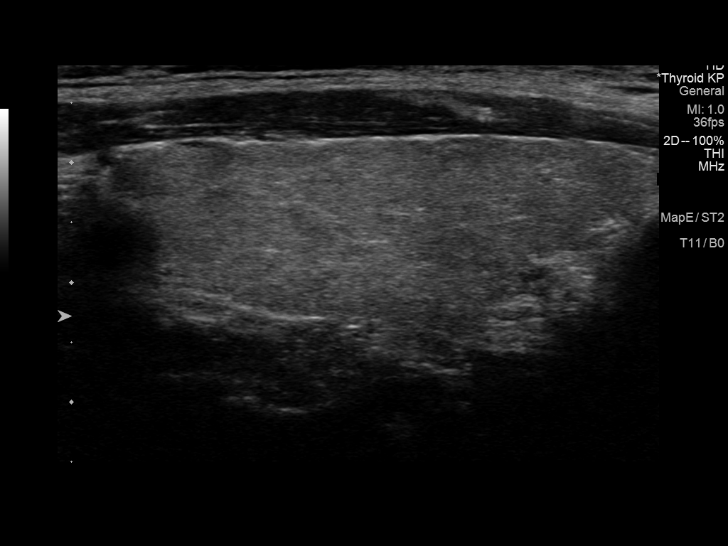
[im 18/53]
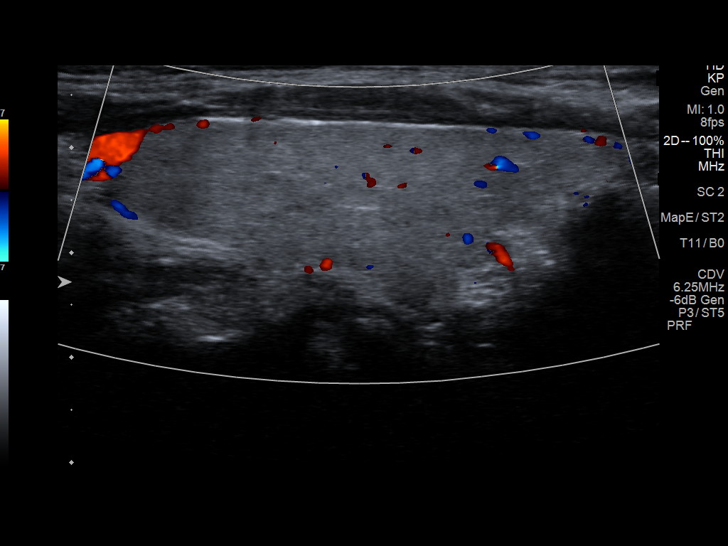
[im 22/53]
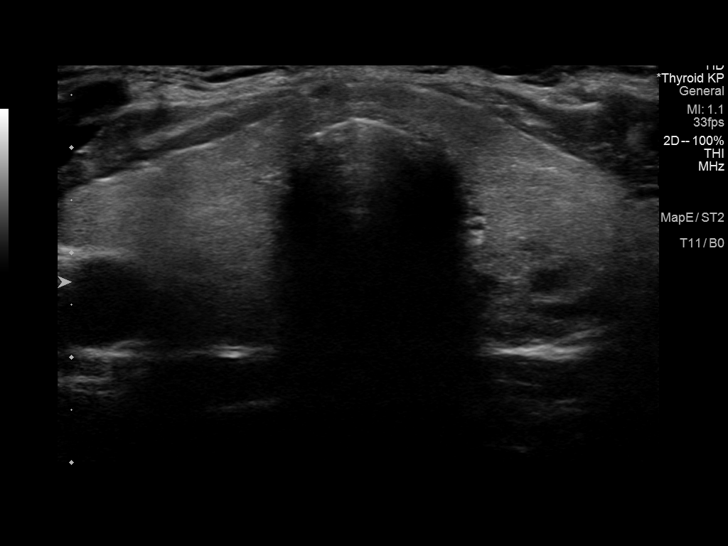
[im 27/53]
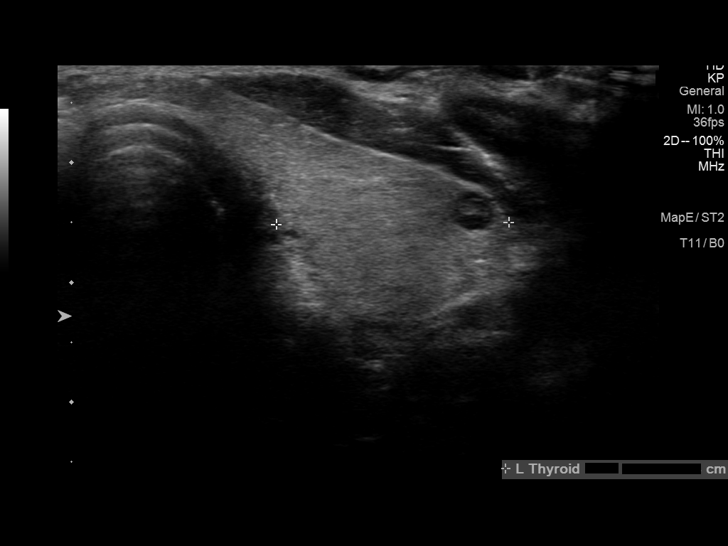
[im 31/53]
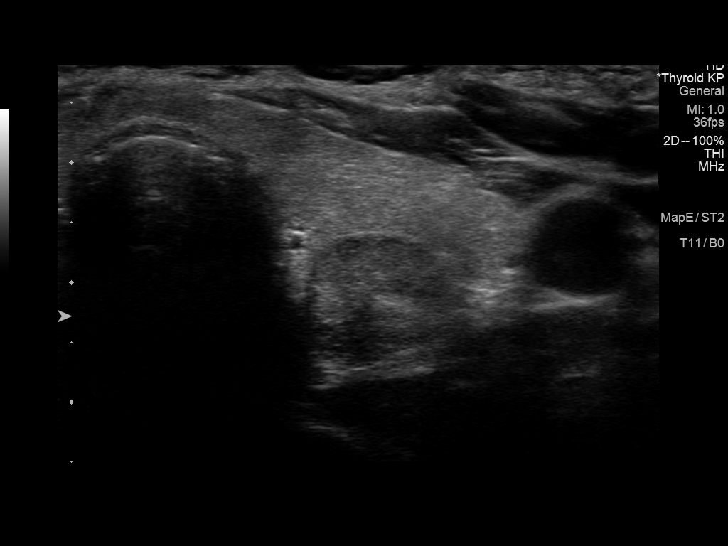
[im 35/53]
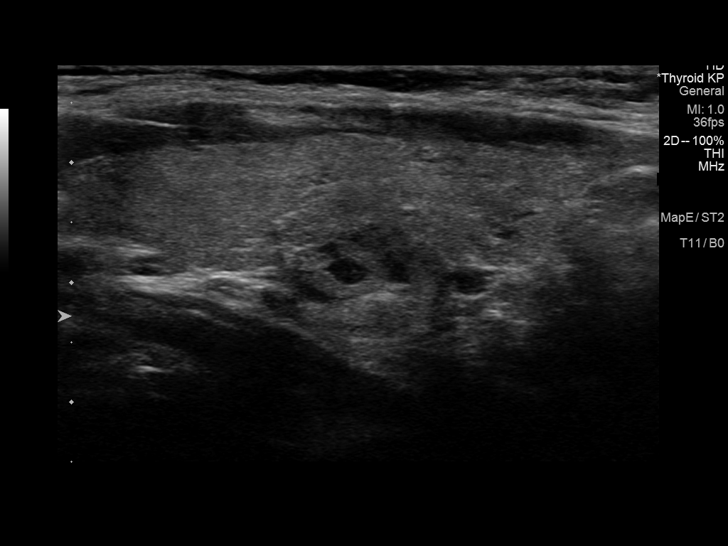
[im 40/53]
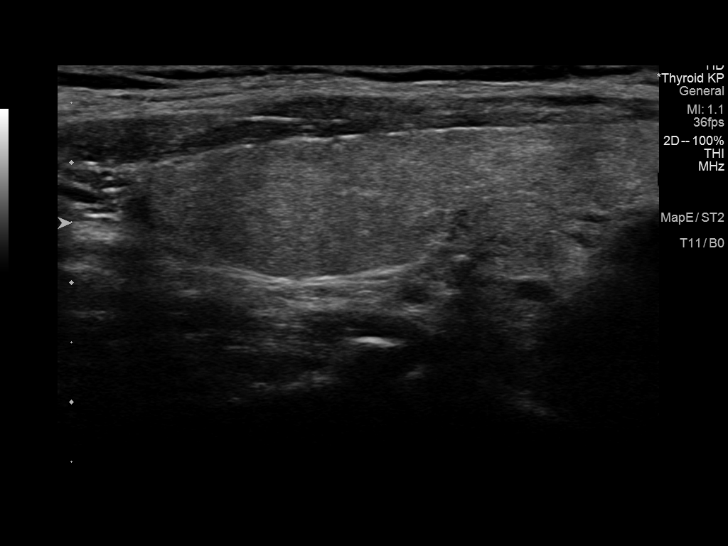
[im 44/53]
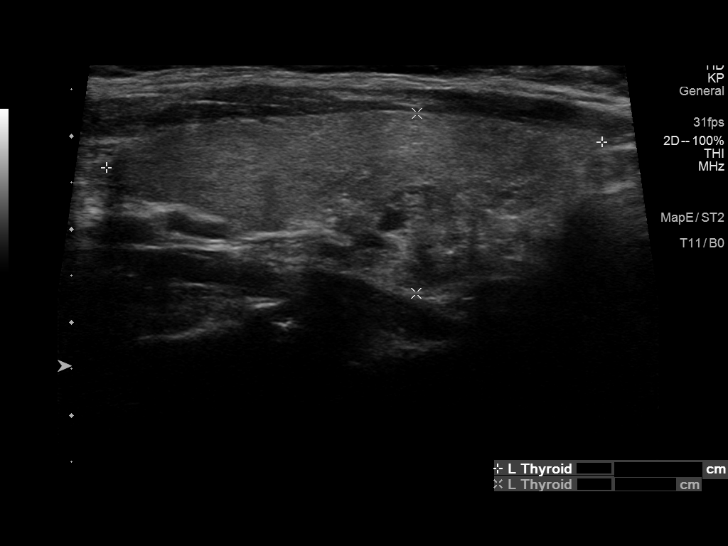
[im 48/53]
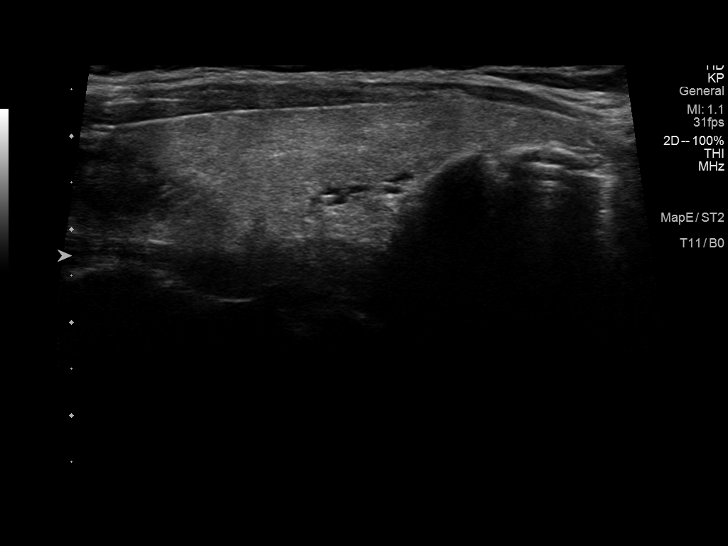
[im 53/53]
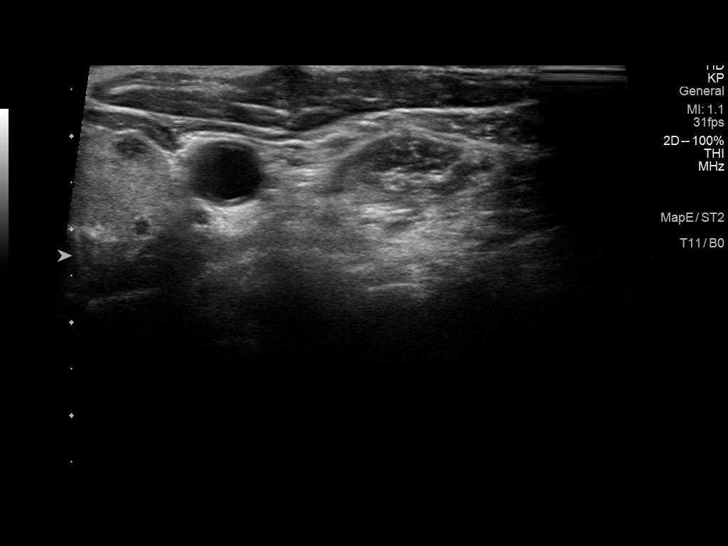

[13 of 25 positions shown; findings below may reference images not displayed]

FINDINGS: Parenchymal Echotexture: Mildly heterogenous

Isthmus: 0.4 cm

Right lobe: 5.2 x 1.7 x 2.0 cm

Left lobe: 5.3 x 1.9 x 1.9 cm

_________________________________________________________

Estimated total number of nodules >/= 1 cm: 1

Number of spongiform nodules >/=  2 cm not described below (TR1): 0

Number of mixed cystic and solid nodules >/= 1.5 cm not described
below (TR2): 0

_________________________________________________________

Nodule # 1:

Location: Left; Inferior

Maximum size: 1.4 cm; Other 2 dimensions: 1.1 x 1.3 cm

Composition: solid/almost completely solid (2)

Echogenicity: isoechoic (1)

Shape: not taller-than-wide (0)

Margins: ill-defined (0)

Echogenic foci: none (0)

ACR TI-RADS total points: 3.

ACR TI-RADS risk category: TR3 (3 points).

ACR TI-RADS recommendations:

Given size (</= 1.4 cm) and appearance, this nodule does NOT meet
TI-RADS criteria for biopsy or dedicated follow-up.

_________________________________________________________

No abnormal lymph nodes identified.
IMPRESSION: 1.4 cm left inferior thyroid nodule does not meet criteria for
biopsy or dedicated follow-up.

The above is in keeping with the ACR TI-RADS recommendations - [HOSPITAL] [FO];[DATE].

## 2021-05-14 ENCOUNTER — Telehealth: Payer: Self-pay | Admitting: Neurology

## 2021-05-14 NOTE — Telephone Encounter (Signed)
I returned the call to the patient's wife. She is his POA and on DPR.  She will not require a letter. She will need to pick up his medical records. Plans to come by our office tomorrow morning to pick them up.

## 2021-05-14 NOTE — Telephone Encounter (Signed)
Pt's wife [name] called states her husband has a meeting with the [employer] on tomorrow about his disability, the letter needs to state pt's diagnoses of his dementia. Pt's wife states they need the letter by tomorrow morning first thing and she will come and pick it up.

## 2021-05-16 DIAGNOSIS — Z7952 Long term (current) use of systemic steroids: Secondary | ICD-10-CM | POA: Diagnosis not present

## 2021-07-02 DIAGNOSIS — R1319 Other dysphagia: Secondary | ICD-10-CM | POA: Diagnosis not present

## 2021-07-02 DIAGNOSIS — Z86010 Personal history of colonic polyps: Secondary | ICD-10-CM | POA: Diagnosis not present

## 2021-07-04 DIAGNOSIS — K222 Esophageal obstruction: Secondary | ICD-10-CM | POA: Diagnosis not present

## 2021-07-04 DIAGNOSIS — R1310 Dysphagia, unspecified: Secondary | ICD-10-CM | POA: Diagnosis not present

## 2021-07-04 DIAGNOSIS — Q399 Congenital malformation of esophagus, unspecified: Secondary | ICD-10-CM | POA: Diagnosis not present

## 2021-07-04 DIAGNOSIS — D132 Benign neoplasm of duodenum: Secondary | ICD-10-CM | POA: Diagnosis not present

## 2021-07-09 DIAGNOSIS — M9901 Segmental and somatic dysfunction of cervical region: Secondary | ICD-10-CM | POA: Diagnosis not present

## 2021-07-09 DIAGNOSIS — M9902 Segmental and somatic dysfunction of thoracic region: Secondary | ICD-10-CM | POA: Diagnosis not present

## 2021-07-09 DIAGNOSIS — M542 Cervicalgia: Secondary | ICD-10-CM | POA: Diagnosis not present

## 2021-07-09 DIAGNOSIS — D132 Benign neoplasm of duodenum: Secondary | ICD-10-CM | POA: Diagnosis not present

## 2021-07-09 DIAGNOSIS — G44209 Tension-type headache, unspecified, not intractable: Secondary | ICD-10-CM | POA: Diagnosis not present

## 2021-07-16 DIAGNOSIS — M9901 Segmental and somatic dysfunction of cervical region: Secondary | ICD-10-CM | POA: Diagnosis not present

## 2021-07-16 DIAGNOSIS — M9902 Segmental and somatic dysfunction of thoracic region: Secondary | ICD-10-CM | POA: Diagnosis not present

## 2021-07-16 DIAGNOSIS — G44209 Tension-type headache, unspecified, not intractable: Secondary | ICD-10-CM | POA: Diagnosis not present

## 2021-07-16 DIAGNOSIS — M542 Cervicalgia: Secondary | ICD-10-CM | POA: Diagnosis not present

## 2021-07-22 ENCOUNTER — Other Ambulatory Visit: Payer: Self-pay | Admitting: Neurology

## 2021-07-23 DIAGNOSIS — G44209 Tension-type headache, unspecified, not intractable: Secondary | ICD-10-CM | POA: Diagnosis not present

## 2021-07-23 DIAGNOSIS — M9902 Segmental and somatic dysfunction of thoracic region: Secondary | ICD-10-CM | POA: Diagnosis not present

## 2021-07-23 DIAGNOSIS — M542 Cervicalgia: Secondary | ICD-10-CM | POA: Diagnosis not present

## 2021-07-23 DIAGNOSIS — M9901 Segmental and somatic dysfunction of cervical region: Secondary | ICD-10-CM | POA: Diagnosis not present

## 2021-07-24 ENCOUNTER — Other Ambulatory Visit: Payer: Self-pay | Admitting: Gastroenterology

## 2021-07-24 DIAGNOSIS — R1310 Dysphagia, unspecified: Secondary | ICD-10-CM

## 2021-07-29 NOTE — Progress Notes (Signed)
PATIENT: Patrick Macias DOB: 1940/07/02  REASON FOR VISIT: Follow up for Dementia HISTORY FROM: Patient PRIMARY NEUROLOGIST: Dr. Krista Macias  HISTORY Patrick Macias is a 81 year old male, accompanied by his daughter Patrick Macias, seen in request by his primary care physician Dr. Crist Macias for evaluation of mental status change, memory loss, generalized weakness, initial evaluation was on 08/01/2020,   I reviewed and summarized the referring note.  Past medical history Hyperlipidemia Prostate hypertrophy Anxiety   He has been working for over 40 years, long his own insurance business, his daughter-in-law works with him, he was noted to have gradual onset of memory loss over the past couple years, become obvious since September 2021, daughter recorded Mini-Mental Status Examination by his primary care physician was 24/30, he was noted to have slow reaction time, need long time to finish conversation, often stop in the middle of the sentence.   This has become much more obvious since his hospital admission from January 1-13, 2022, before hospital admission, he developed hives on different part of his body, he has to take Zyrtec at home, with no help, was not able to sleep well due to itching rash, on the day he was admitted to the hospital on June 30, 2020, he was found to have low-grade fever, significant confusion, difficulty getting out of the bed, slurred speech, paramedic brought him to the emergency room,   Emergency room, fever, temperature 100.5, C-reactive protein 12.6,   CT angiogram of head and neck showed no significant large vessel disease   Personally personally reviewed MRI of the brain on 07/02/2020, compared to previous MRI in December 2021, in September 2021, no acute abnormality, advanced supratentorium, periventricular, right paramedian pons T2 flair signal abnormality, most consistent with small vessel disease,   Laboratory evaluations: CMP, calcium slightly decreased 8.8,  glucose was mildly elevated 108, CBC showed hemoglobin of 11.1, ammonia 16, cyclic citrul peptide antibody were negative, hepatitis B surface antigen, HCV antibody, hepatitis a IgM, hepatitis B C IgM were all negative, pro calcitonin was normal, rheumatoid factor was elevated at 30.5, double-stranded DNA was negative, ANA was negative, CMV PCR, EBV DNA PCR, Lyme titer, varicella-zoster PCR, HIV, vitamin B1, vitamin D, RPR, ammonia, G54, folic acid, TSH, were all normal, A1c was 5.1, ESR was normal 8, negative UDS, the only abnormality was mildly decreased B12 level 221, is on B12 supplement.   CSF study: WBC 1, RBC 2, total protein of 41, glucose of 67, HSV 1/2 PCR negative   Extensive [facility] CSF encephalopathy-autoimmune panels were negative   He also had a hospital admission on December 3-6, 2021, hospital admission for increased confusion, speech difficulty, unsteady on his feet, fidgety,   During today's interview, patient reported he is back to his baseline, wife and daughter reported that he is 75% back to his baseline, was noted to have significant memory loss, Mini-Mental Status Examination is 24 out of 30, often stopping the middle of the sentence, change subject, slow reaction time,   UPDATE February 11 2021: He is accompanied by his wife at today's visit, his daughter is on the phone, he is closing his business, over the past few months, he was noted to have increased difficulty, difficulty finishing up his symptoms, difficulty following through the conversation, sleeps a lot, he used to be very active, he is no longer motivated, during the day, he watches TV most of the time, take long naps during the day, has good appetite, he can still dress, tolerating,  bathing himself, mild unsteady gait  Update July 30, 2021 SS: here today with daughter, Patrick Macias. Doing well, taking Aricept in AM due to nightmares at night. Still on Namenda 10 mg twice daily. Still doing own ADLs, lives with wife  Patrick Macias who is patient here. Does the yard work, cleans inside. Driving only short distances. Reports 90%  back to baseline since Aricept was added, within 2 months, PCP couldn't believe the improvement! He shut his business down, but is back to talking with some clients. Most of trouble is with short term memory and word findings. Family can't believe how much better he is. Didn't do memory test today, reportedly traumatic for him. Very engaged, participatory today, laughing.   REVIEW OF SYSTEMS: Out of a complete 14 system review of symptoms, the patient complains only of the following symptoms, and all other reviewed systems are negative.  See HPI  ALLERGIES: No Known Allergies  HOME MEDICATIONS: Outpatient Medications Prior to Visit  Medication Sig Dispense Refill   acetaminophen (TYLENOL) 500 MG tablet Take 1,000 mg by mouth every 6 (six) hours as needed for headache (pain).     aspirin EC 81 MG tablet Take 1 tablet (81 mg total) by mouth daily. 90 tablet 3   bisacodyl (DULCOLAX) 10 MG suppository Place 1 suppository (10 mg total) rectally daily as needed for moderate constipation. 12 suppository 0   buPROPion (WELLBUTRIN XL) 150 MG 24 hr tablet Take 150 mg by mouth every morning.     cholecalciferol (VITAMIN D3) 25 MCG (1000 UNIT) tablet Take 1,000 Units by mouth daily.     donepezil (ARICEPT) 10 MG tablet Take 1 tablet (10 mg total) by mouth at bedtime. 30 tablet 11   escitalopram (LEXAPRO) 20 MG tablet Take 20 mg by mouth every morning.     memantine (NAMENDA) 10 MG tablet Take 1 tablet (10 mg total) by mouth 2 (two) times daily. 180 tablet 4   Multiple Vitamin (MULTIVITAMIN) tablet Take 1 tablet by mouth daily.     rosuvastatin (CRESTOR) 20 MG tablet Take 20 mg by mouth daily.     No facility-administered medications prior to visit.    PAST MEDICAL HISTORY: Past Medical History:  Diagnosis Date   Anxiety    BPH (benign prostatic hyperplasia)    Cerumen impaction    Dyslipidemia     H/O lithotripsy    Hearing decreased, bilateral    Hx of colonic polyps    IFG (impaired fasting glucose)    Macrocytosis    Memory loss    Nephrolithiasis    Vitamin D deficiency     PAST SURGICAL HISTORY: Past Surgical History:  Procedure Laterality Date   CATARACT EXTRACTION, BILATERAL     PROSTATECTOMY     Partial    FAMILY HISTORY: Family History  Problem Relation Age of Onset   Leukemia Mother    Breast cancer Mother    Other Father        unknown medical history   Sudden death Neg Hx    Hypertension Neg Hx    Hyperlipidemia Neg Hx    Diabetes Neg Hx    Heart attack Neg Hx     SOCIAL HISTORY: Social History   Socioeconomic History   Marital status: Married    Spouse name: [name]   Number of children: 2   Years of education: college   Highest education level: Bachelor's degree (e.g., BA, AB, BS)  Occupational History   Occupation: Insurance   Tobacco Use  Smoking status: Never   Smokeless tobacco: Never  Substance and Sexual Activity   Alcohol use: Not Currently   Drug use: Never   Sexual activity: Not on file  Other Topics Concern   Not on file  Social History Narrative   Lives at home with his wife.   Left-handed.   Two cups caffeine per day.   Social Determinants of Health   Financial Resource Strain: Not on file  Food Insecurity: Not on file  Transportation Needs: Not on file  Physical Activity: Not on file  Stress: Not on file  Social Connections: Not on file  Intimate Partner Violence: Not on file      PHYSICAL EXAM  Vitals:   07/30/21 0924  BP: (!) 150/68  Pulse: (!) 53  Weight: 177 lb (80.3 kg)  Height: 6' (1.829 m)   Body mass index is 24.01 kg/m.  Generalized: Well developed, in no acute distress  Montreal Cognitive Assessment  02/11/2021  Visuospatial/ Executive (0/5) 0  Naming (0/3) 3  Attention: Read list of digits (0/2) 0  Attention: Read list of letters (0/1) 0  Attention: Serial 7 subtraction starting at  100 (0/3) 0  Language: Repeat phrase (0/2) 0  Language : Fluency (0/1) 0  Abstraction (0/2) 1  Delayed Recall (0/5) 0  Orientation (0/6) 4  Total 8   Neurological examination  Mentation: Alert oriented to time, place, history taking. Follows all commands speech and language fluent Cranial nerve II-XII: Pupils were equal round reactive to light. Extraocular movements were full, visual field were full on confrontational test. Facial sensation and strength were normal. Head turning and shoulder shrug  were normal and symmetric. Motor: The motor testing reveals 5 over 5 strength of all 4 extremities. Good symmetric motor tone is noted throughout.  Sensory: Sensory testing is intact to soft touch on all 4 extremities. No evidence of extinction is noted.  Coordination: Cerebellar testing reveals good finger-nose-finger and heel-to-shin bilaterally.  Gait and station: Gait is slightly wide based Reflexes: Deep tendon reflexes are symmetric and normal bilaterally.   DIAGNOSTIC DATA (LABS, IMAGING, TESTING) - I reviewed patient records, labs, notes, testing and imaging myself where available.  Lab Results  Component Value Date   WBC 4.6 04/02/2021   HGB 11.5 (L) 04/02/2021   HCT 33.4 (L) 04/02/2021   MCV 103.7 (H) 04/02/2021   PLT 237 04/02/2021      Component Value Date/Time   NA 138 04/02/2021 1009   K 4.1 04/02/2021 1009   CL 106 04/02/2021 1009   CO2 25 04/02/2021 1009   GLUCOSE 96 04/02/2021 1009   BUN 17 04/02/2021 1009   CREATININE 1.06 04/02/2021 1009   CALCIUM 9.1 04/02/2021 1009   PROT 7.3 04/02/2021 1009   ALBUMIN 4.3 04/02/2021 1009   AST 39 04/02/2021 1009   ALT 36 04/02/2021 1009   ALKPHOS 52 04/02/2021 1009   BILITOT 1.3 (H) 04/02/2021 1009   GFRNONAA >60 04/02/2021 1009   GFRAA  03/21/2008 0430    >60        The eGFR has been calculated using the MDRD equation. This calculation has not been validated in all clinical   Lab Results  Component Value Date    CHOL 86 06/02/2020   HDL 36 (L) 06/02/2020   LDLCALC 32 06/02/2020   TRIG 91 06/02/2020   CHOLHDL 2.4 06/02/2020   Lab Results  Component Value Date   HGBA1C 5.1 06/02/2020   Lab Results  Component Value Date  PUGGPCWT96 377 04/02/2021   Lab Results  Component Value Date   TSH 0.873 06/30/2020      ASSESSMENT AND PLAN 81 y.o. year old male  has a past medical history of Anxiety, BPH (benign prostatic hyperplasia), Cerumen impaction, Dyslipidemia, H/O lithotripsy, Hearing decreased, bilateral, colonic polyps, IFG (impaired fasting glucose), Macrocytosis, Memory loss, Nephrolithiasis, and Vitamin D deficiency. 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, [place], [place] [address] [phone]

## 2021-07-30 ENCOUNTER — Encounter: Payer: Self-pay | Admitting: Neurology

## 2021-07-30 ENCOUNTER — Ambulatory Visit: Payer: Medicare HMO | Admitting: Neurology

## 2021-07-30 VITALS — BP 150/68 | HR 53 | Ht 72.0 in | Wt 177.0 lb

## 2021-07-30 DIAGNOSIS — R69 Illness, unspecified: Secondary | ICD-10-CM | POA: Diagnosis not present

## 2021-07-30 DIAGNOSIS — F03918 Unspecified dementia, unspecified severity, with other behavioral disturbance: Secondary | ICD-10-CM | POA: Diagnosis not present

## 2021-07-30 MED ORDER — DONEPEZIL HCL 10 MG PO TABS: 10.0000 mg | ORAL_TABLET | Freq: Every day | ORAL | 4 refills | Status: DC

## 2021-07-30 MED ORDER — MEMANTINE HCL 10 MG PO TABS: 10.0000 mg | ORAL_TABLET | Freq: Two times a day (BID) | ORAL | 4 refills | Status: DC

## 2021-07-30 NOTE — Patient Instructions (Signed)
Great to see you today  Continue current medications Continue to see your primary care doctor See you back in 1 year

## 2021-07-31 ENCOUNTER — Ambulatory Visit
Admission: RE | Admit: 2021-07-31 | Discharge: 2021-07-31 | Disposition: A | Discharge status: Home / Self Care | Payer: Medicare HMO | Source: Ambulatory Visit | Attending: Gastroenterology | Admitting: Gastroenterology

## 2021-07-31 DIAGNOSIS — M542 Cervicalgia: Secondary | ICD-10-CM | POA: Diagnosis not present

## 2021-07-31 DIAGNOSIS — M9902 Segmental and somatic dysfunction of thoracic region: Secondary | ICD-10-CM | POA: Diagnosis not present

## 2021-07-31 DIAGNOSIS — R1310 Dysphagia, unspecified: Secondary | ICD-10-CM | POA: Diagnosis not present

## 2021-07-31 DIAGNOSIS — G44209 Tension-type headache, unspecified, not intractable: Secondary | ICD-10-CM | POA: Diagnosis not present

## 2021-07-31 DIAGNOSIS — K449 Diaphragmatic hernia without obstruction or gangrene: Secondary | ICD-10-CM | POA: Diagnosis not present

## 2021-07-31 DIAGNOSIS — K224 Dyskinesia of esophagus: Secondary | ICD-10-CM | POA: Diagnosis not present

## 2021-07-31 DIAGNOSIS — M9901 Segmental and somatic dysfunction of cervical region: Secondary | ICD-10-CM | POA: Diagnosis not present

## 2021-07-31 IMAGING — RF DG ESOPHAGUS
10 series · 14 of 24 positions shown · non-contrast
Comparison: Limited correlation made with cardiac CT [DATE].

CLINICAL DATA: Difficulty with feeding, sensation of solids
becoming stuck in esophagus, reports occasional cough with eating,
reports recent esophageal dilatation.

EXAM:
ESOPHAGUS/BARIUM SWALLOW/TABLET STUDY
TECHNIQUE: Limited single contrast examination was performed using thin liquid
barium. This exam was performed by ARMILIATO, and was
supervised and interpreted by ARMILIATO.
FLUOROSCOPY TIME:  Radiation Exposure Index (as provided by the
fluoroscopic device): [B5]

[Series 1: sequence · 2 of 26 frames shown (1 of 10)]
[frame 4/26]
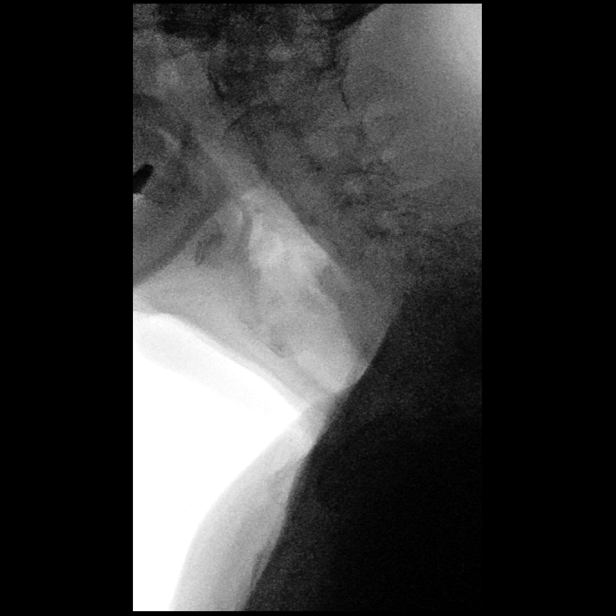
[frame 23/26]
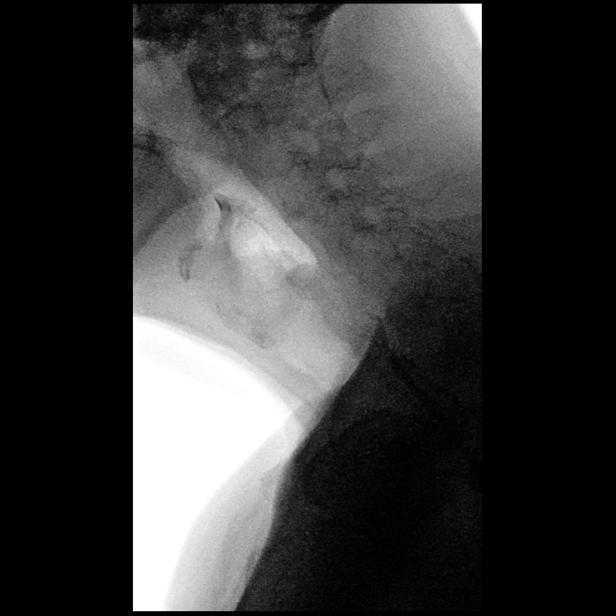

[Series 2: sequence · 1 of 36 frames shown (2 of 10)]
[frame 31/36]
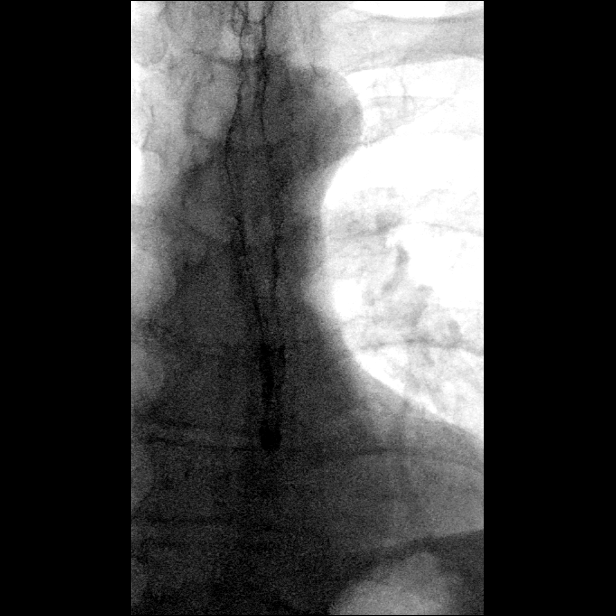

[Series 3: sequence · 1 of 32 frames shown (3 of 10)]
[frame 28/32]
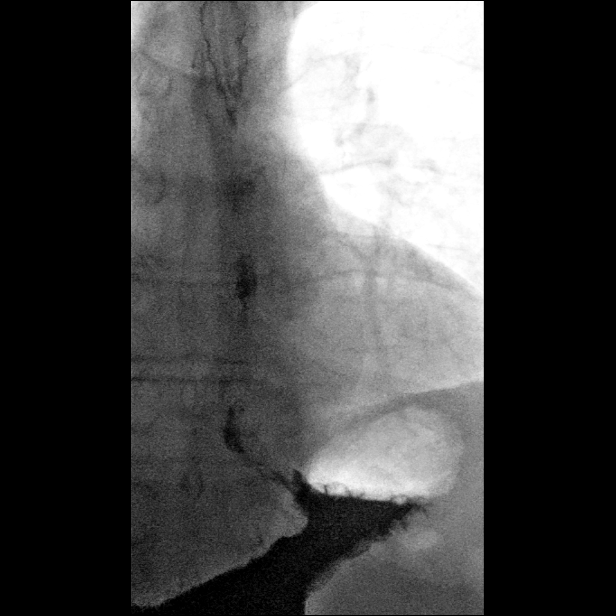

[Series 4: sequence · 2 of 72 frames shown (4 of 10)]
[frame 11/72]
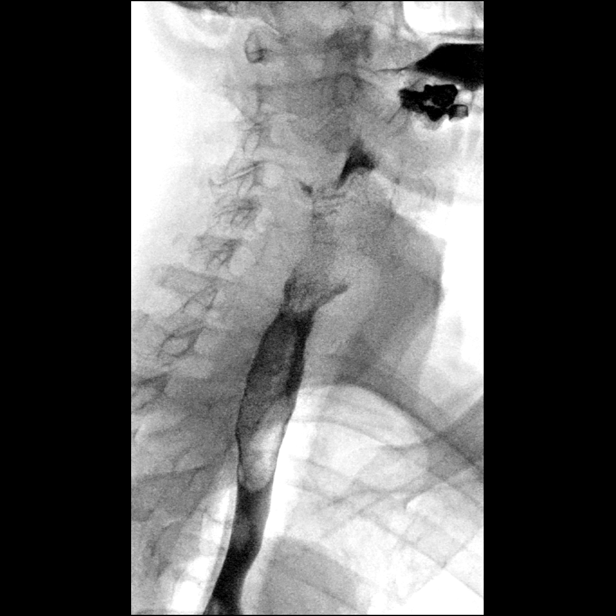
[frame 62/72]
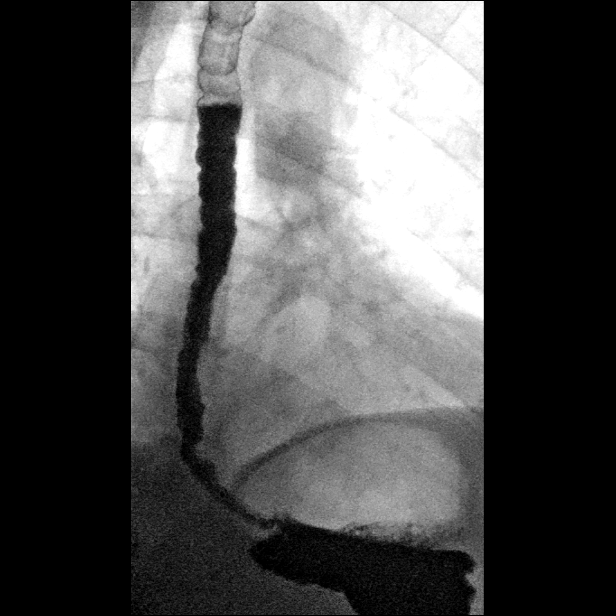

[Series 5: sequence · 1 of 24 frames shown (5 of 10)]
[frame 21/24]
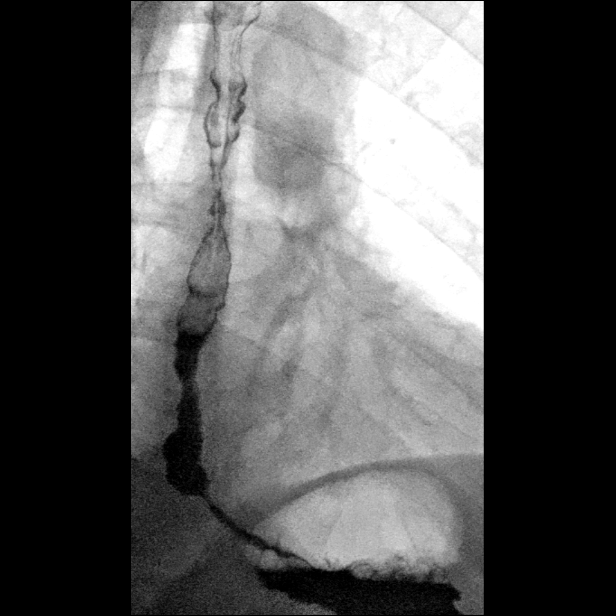

[Series 6: sequence · 1 of 10 frames shown (6 of 10)]
[frame 2/10]
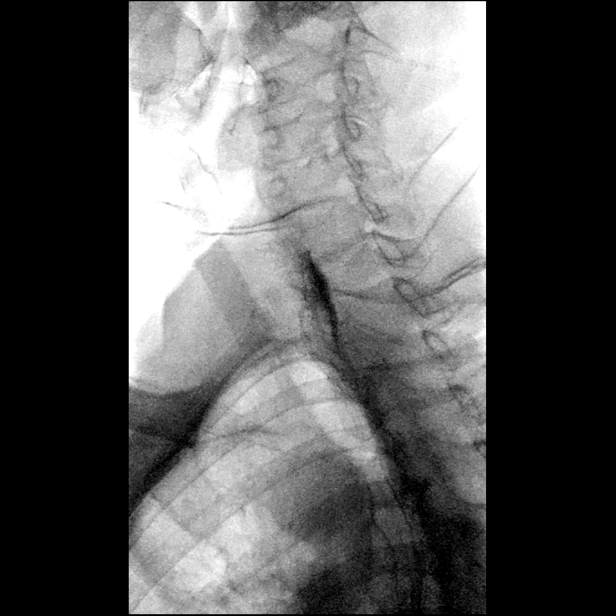

[Series 8: sequence · 2 of 27 frames shown (7 of 10)]
[frame 5/27]
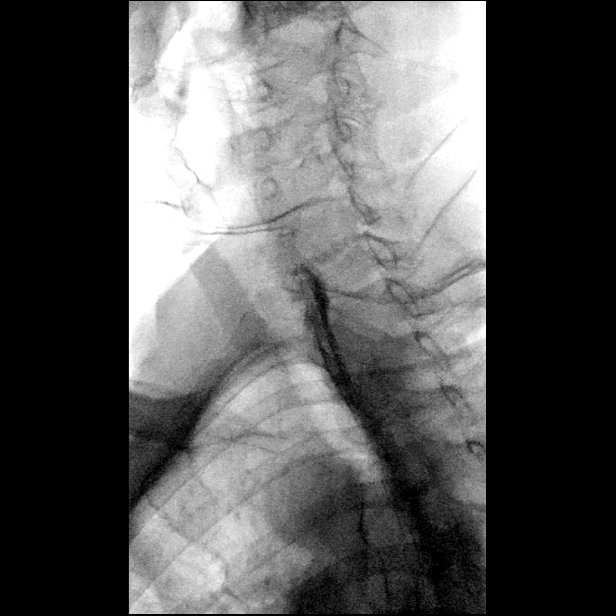
[frame 24/27]
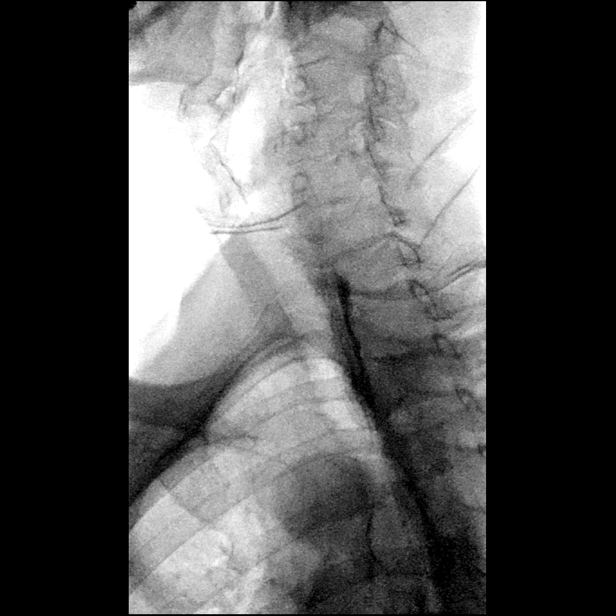

[Series 9: sequence · 1 of 12 frames shown (8 of 10)]
[frame 11/12]
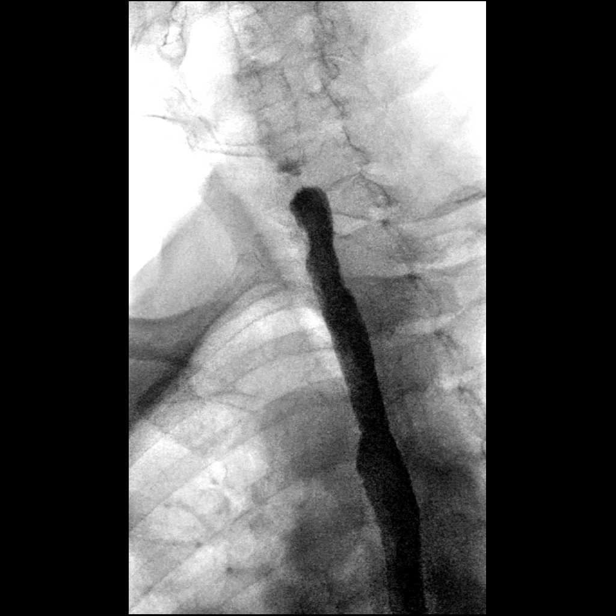

[Series 10: sequence · 1 of 58 frames shown (9 of 10)]
[frame 24/58]
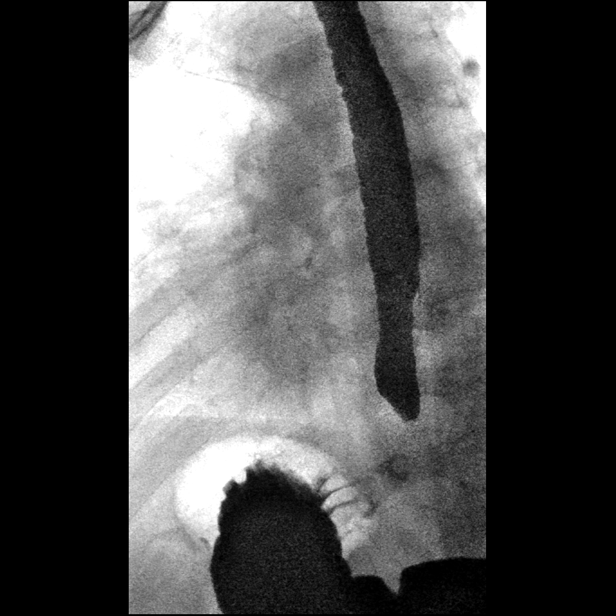

[Series 11: sequence · 2 of 32 frames shown (10 of 10)]
[frame 5/32]
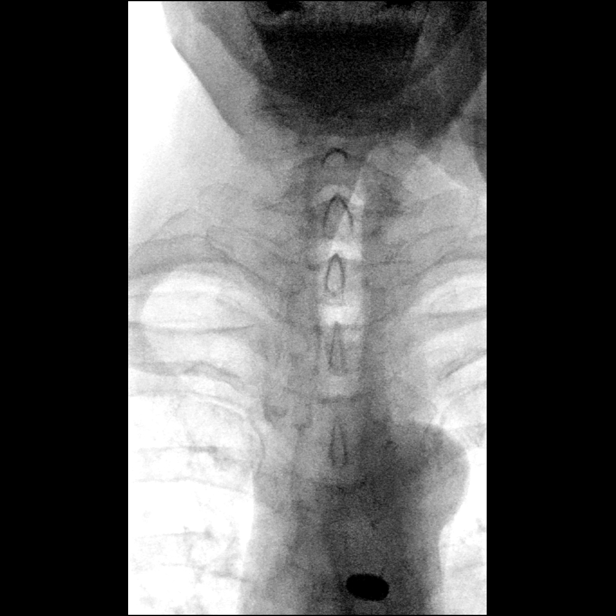
[frame 29/32]
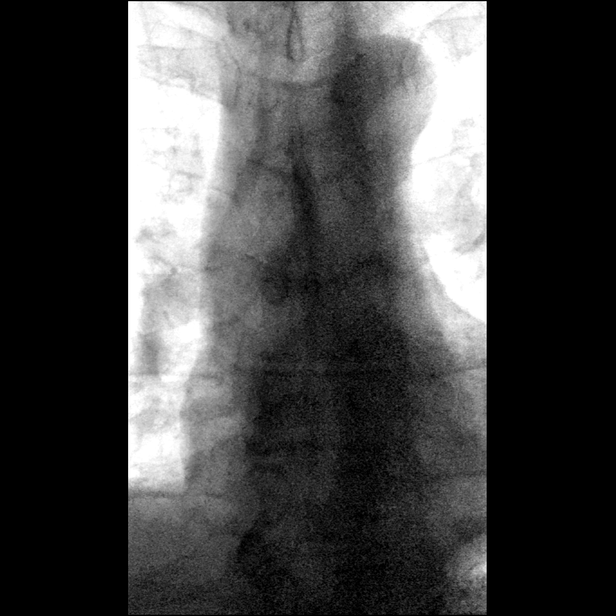

[14 of 24 positions shown; findings below may reference images not displayed]

FINDINGS: The patient swallowed the barium without difficulty. Rapid sequence
imaging of the pharynx in the AP and lateral projections
demonstrates no laryngeal penetration or mucosal abnormalities.

There is mild esophageal dysmotility with increased tertiary
contractions. There is no evidence of esophageal mass, stricture or
ulceration. There is a small reducible hiatal hernia with mild
luminal narrowing just above the hernia. No gastroesophageal reflux
was visualized.

At the conclusion of the study, a 13 mm barium tablet was
administered. This passed without delay into the stomach.
IMPRESSION: Limited exam secondary to patient clinical status/hard of hearing

1.  Mild esophageal dysmotility (presbyesophagus).

2. Small hiatal hernia without demonstrated reflux or significant
stricture. Barium tablet passed without delay into the stomach.

## 2021-08-06 DIAGNOSIS — G44209 Tension-type headache, unspecified, not intractable: Secondary | ICD-10-CM | POA: Diagnosis not present

## 2021-08-06 DIAGNOSIS — M542 Cervicalgia: Secondary | ICD-10-CM | POA: Diagnosis not present

## 2021-08-06 DIAGNOSIS — M9901 Segmental and somatic dysfunction of cervical region: Secondary | ICD-10-CM | POA: Diagnosis not present

## 2021-08-06 DIAGNOSIS — M9902 Segmental and somatic dysfunction of thoracic region: Secondary | ICD-10-CM | POA: Diagnosis not present

## 2021-08-09 ENCOUNTER — Encounter (HOSPITAL_BASED_OUTPATIENT_CLINIC_OR_DEPARTMENT_OTHER): Payer: Self-pay

## 2021-08-09 ENCOUNTER — Emergency Department (HOSPITAL_BASED_OUTPATIENT_CLINIC_OR_DEPARTMENT_OTHER): Payer: Medicare HMO

## 2021-08-09 ENCOUNTER — Other Ambulatory Visit: Payer: Self-pay

## 2021-08-09 ENCOUNTER — Telehealth (HOSPITAL_BASED_OUTPATIENT_CLINIC_OR_DEPARTMENT_OTHER): Payer: Self-pay | Admitting: Emergency Medicine

## 2021-08-09 ENCOUNTER — Emergency Department (HOSPITAL_BASED_OUTPATIENT_CLINIC_OR_DEPARTMENT_OTHER)
Admission: EM | Admit: 2021-08-09 | Discharge: 2021-08-09 | Disposition: A | Discharge status: Home / Self Care | Payer: Medicare HMO | Attending: Emergency Medicine | Admitting: Emergency Medicine

## 2021-08-09 DIAGNOSIS — R1011 Right upper quadrant pain: Secondary | ICD-10-CM | POA: Diagnosis not present

## 2021-08-09 DIAGNOSIS — Z7982 Long term (current) use of aspirin: Secondary | ICD-10-CM | POA: Insufficient documentation

## 2021-08-09 DIAGNOSIS — N132 Hydronephrosis with renal and ureteral calculous obstruction: Secondary | ICD-10-CM | POA: Insufficient documentation

## 2021-08-09 DIAGNOSIS — N134 Hydroureter: Secondary | ICD-10-CM | POA: Diagnosis not present

## 2021-08-09 DIAGNOSIS — R1310 Dysphagia, unspecified: Secondary | ICD-10-CM | POA: Insufficient documentation

## 2021-08-09 DIAGNOSIS — R109 Unspecified abdominal pain: Secondary | ICD-10-CM | POA: Diagnosis not present

## 2021-08-09 DIAGNOSIS — F0390 Unspecified dementia without behavioral disturbance: Secondary | ICD-10-CM | POA: Insufficient documentation

## 2021-08-09 DIAGNOSIS — N200 Calculus of kidney: Secondary | ICD-10-CM | POA: Diagnosis not present

## 2021-08-09 DIAGNOSIS — R079 Chest pain, unspecified: Secondary | ICD-10-CM | POA: Diagnosis not present

## 2021-08-09 DIAGNOSIS — R69 Illness, unspecified: Secondary | ICD-10-CM | POA: Diagnosis not present

## 2021-08-09 LAB — CBC WITH DIFFERENTIAL/PLATELET
Abs Immature Granulocytes: 0.01 10*3/uL (ref 0.00–0.07)
Basophils Absolute: 0 10*3/uL (ref 0.0–0.1)
Basophils Relative: 1 %
Eosinophils Absolute: 0.2 10*3/uL (ref 0.0–0.5)
Eosinophils Relative: 3 %
HCT: 42.1 % (ref 39.0–52.0)
Hemoglobin: 14.2 g/dL (ref 13.0–17.0)
Immature Granulocytes: 0 %
Lymphocytes Relative: 30 %
Lymphs Abs: 1.5 10*3/uL (ref 0.7–4.0)
MCH: 35.1 pg — ABNORMAL HIGH (ref 26.0–34.0)
MCHC: 33.7 g/dL (ref 30.0–36.0)
MCV: 104 fL — ABNORMAL HIGH (ref 80.0–100.0)
Monocytes Absolute: 0.5 10*3/uL (ref 0.1–1.0)
Monocytes Relative: 10 %
Neutro Abs: 2.9 10*3/uL (ref 1.7–7.7)
Neutrophils Relative %: 56 %
Platelets: 200 10*3/uL (ref 150–400)
RBC: 4.05 MIL/uL — ABNORMAL LOW (ref 4.22–5.81)
RDW: 12.7 % (ref 11.5–15.5)
WBC: 5.1 10*3/uL (ref 4.0–10.5)
nRBC: 0 % (ref 0.0–0.2)

## 2021-08-09 LAB — URINALYSIS, ROUTINE W REFLEX MICROSCOPIC
Bilirubin Urine: NEGATIVE
Glucose, UA: NEGATIVE mg/dL
Ketones, ur: NEGATIVE mg/dL
Leukocytes,Ua: NEGATIVE
Nitrite: NEGATIVE
Protein, ur: NEGATIVE mg/dL
Specific Gravity, Urine: 1.025 (ref 1.005–1.030)
pH: 6 (ref 5.0–8.0)

## 2021-08-09 LAB — LIPASE, BLOOD: Lipase: 44 U/L (ref 11–51)

## 2021-08-09 LAB — COMPREHENSIVE METABOLIC PANEL
ALT: 34 U/L (ref 0–44)
AST: 35 U/L (ref 15–41)
Albumin: 4.5 g/dL (ref 3.5–5.0)
Alkaline Phosphatase: 49 U/L (ref 38–126)
Anion gap: 8 (ref 5–15)
BUN: 27 mg/dL — ABNORMAL HIGH (ref 8–23)
CO2: 26 mmol/L (ref 22–32)
Calcium: 9.3 mg/dL (ref 8.9–10.3)
Chloride: 107 mmol/L (ref 98–111)
Creatinine, Ser: 1 mg/dL (ref 0.61–1.24)
GFR, Estimated: >60 mL/min (ref >60)
Glucose, Bld: 112 mg/dL — ABNORMAL HIGH (ref 70–99)
Potassium: 3.8 mmol/L (ref 3.5–5.1)
Sodium: 141 mmol/L (ref 135–145)
Total Bilirubin: 1 mg/dL (ref 0.3–1.2)
Total Protein: 7.6 g/dL (ref 6.5–8.1)

## 2021-08-09 LAB — URINALYSIS, MICROSCOPIC (REFLEX)

## 2021-08-09 MED ORDER — IOHEXOL 300 MG/ML  SOLN
100.0000 mL | Freq: Once | INTRAMUSCULAR | Status: AC | PRN
  Administered 2021-08-09: 100 mL via INTRAVENOUS

## 2021-08-09 MED ORDER — SODIUM CHLORIDE 0.9 % IV BOLUS
1000.0000 mL | Freq: Once | INTRAVENOUS | Status: AC
  Administered 2021-08-09: 1000 mL via INTRAVENOUS

## 2021-08-09 MED ORDER — OXYCODONE-ACETAMINOPHEN 5-325 MG PO TABS: 1.0000 | ORAL_TABLET | Freq: Four times a day (QID) | ORAL | 0 refills | Status: AC | PRN

## 2021-08-09 MED ORDER — ONDANSETRON HCL 4 MG/2ML IJ SOLN
4.0000 mg | Freq: Once | INTRAMUSCULAR | Status: DC
  Filled 2021-08-09: qty 2

## 2021-08-09 MED ORDER — MORPHINE SULFATE (PF) 2 MG/ML IV SOLN
2.0000 mg | Freq: Once | INTRAVENOUS | Status: DC
  Filled 2021-08-09: qty 1

## 2021-08-09 NOTE — ED Triage Notes (Signed)
Pt reports right sided mid abdominal pain for 3-4 days. Pt reports hurts when he presses the area

## 2021-08-09 NOTE — Discharge Instructions (Signed)
Follow up with your urologist.    Return for fever, uncontrolled pain.   You can take tylenol 1000mg (2 extra strength) up to four times a day.

## 2021-08-09 NOTE — ED Notes (Addendum)
Per family and daughter , pt has hx of hospital psychosis  from the last time he was hospitalized after getting , Haldol,  ativan and Morphine , pts family wants ,at all cost ,to avoid this again,  so they refused the pain med

## 2021-08-09 NOTE — Telephone Encounter (Signed)
Encounter created to provide additional pain management for patient. Dicussed with Deno Etienne with saw patient initially. Has kidney stone. He feels would be reasonable to give short rx for percocet until can be seen in clinic by urology.

## 2021-08-09 NOTE — ED Provider Notes (Signed)
**Note Patrick-Identified via Obfuscation** [facility] Provider Note   CSN: [id number] Arrival date & time: 08/09/21  6546     History  Chief Complaint  Patient presents with   Abdominal Pain    Patrick Macias is a 81 y.o. male.  81 yo M with a chief complaint of right-sided abdominal discomfort.  Going on for about 3 days now.  Worse when he pushes on the area.  Nothing else seems to make it better or worse.  He denies fevers denies nausea denies vomiting denies diarrhea denies cough or congestion.  He has had some difficulty swallowing and is seeing GI for this.  Had a study that told him that he had esophageal dysmotility.   Abdominal Pain     Home Medications Prior to Admission medications   Medication Sig Start Date End Date Taking? Authorizing Provider  acetaminophen (TYLENOL) 500 MG tablet Take 1,000 mg by mouth every 6 (six) hours as needed for headache (pain).    [provider]  aspirin EC 81 MG tablet Take 1 tablet (81 mg total) by mouth daily. 05/11/19   Belva Crome, MD  bisacodyl (DULCOLAX) 10 MG suppository Place 1 suppository (10 mg total) rectally daily as needed for moderate constipation. 07/12/20   Geradine Girt, DO  buPROPion (WELLBUTRIN XL) 150 MG 24 hr tablet Take 150 mg by mouth every morning.    [provider]  cholecalciferol (VITAMIN D3) 25 MCG (1000 UNIT) tablet Take 1,000 Units by mouth daily.    [provider]  donepezil (ARICEPT) 10 MG tablet Take 1 tablet (10 mg total) by mouth at bedtime. 07/30/21   Suzzanne Cloud, NP  escitalopram (LEXAPRO) 20 MG tablet Take 20 mg by mouth every morning.    [provider]  memantine (NAMENDA) 10 MG tablet Take 1 tablet (10 mg total) by mouth 2 (two) times daily. 07/30/21   Suzzanne Cloud, NP  Multiple Vitamin (MULTIVITAMIN) tablet Take 1 tablet by mouth daily.    [provider]  rosuvastatin (CRESTOR) 20 MG tablet Take 20 mg by mouth daily. 05/03/19   [provider]       Allergies    Patient has no known allergies.    Review of Systems   Review of Systems  Gastrointestinal:  Positive for abdominal pain.   Physical Exam Updated Vital Signs BP (!) 173/76    Pulse (!) 54    Resp 16    Wt 80.3 kg    SpO2 100%    BMI 24.01 kg/m  Physical Exam Vitals and nursing note reviewed.  Constitutional:      Appearance: He is well-developed.  HENT:     Head: Normocephalic and atraumatic.  Eyes:     Pupils: Pupils are equal, round, and reactive to light.  Neck:     Vascular: No JVD.  Cardiovascular:     Rate and Rhythm: Normal rate and regular rhythm.     Heart sounds: No murmur heard.   No friction rub. No gallop.  Pulmonary:     Effort: No respiratory distress.     Breath sounds: No wheezing.  Abdominal:     General: There is no distension.     Tenderness: There is abdominal tenderness. There is no guarding or rebound.     Comments: Pain worse adjacent to the umbilicus on the right about the anterior axillary to the lateral clavicular line.  He has some pain at McBurney's points and some pain in the  right upper quadrant.  Negative Murphy's.  Musculoskeletal:        General: Normal range of motion.     Cervical back: Normal range of motion and neck supple.  Skin:    Coloration: Skin is not pale.     Findings: No rash.  Neurological:     Mental Status: He is alert and oriented to person, place, and time.  Psychiatric:        Behavior: Behavior normal.    ED Results / Procedures / Treatments   Labs (all labs ordered are listed, but only abnormal results are displayed) Labs Reviewed  CBC WITH DIFFERENTIAL/PLATELET - Abnormal; Notable for the following components:      Result Value   RBC 4.05 (*)    MCV 104.0 (*)    MCH 35.1 (*)    All other components within normal limits  COMPREHENSIVE METABOLIC PANEL - Abnormal; Notable for the following components:   Glucose, Bld 112 (*)    BUN 27 (*)    All other components within normal limits   URINALYSIS, ROUTINE W REFLEX MICROSCOPIC - Abnormal; Notable for the following components:   Hgb urine dipstick TRACE (*)    All other components within normal limits  URINALYSIS, MICROSCOPIC (REFLEX) - Abnormal; Notable for the following components:   Bacteria, UA RARE (*)    All other components within normal limits  LIPASE, BLOOD    EKG None  Radiology CT ABDOMEN PELVIS W CONTRAST  Result Date: 08/09/2021 CLINICAL DATA:  RLQ abdominal pain (Age >= 14y) EXAM: CT ABDOMEN AND PELVIS WITH CONTRAST TECHNIQUE: Multidetector CT imaging of the abdomen and pelvis was performed using the standard protocol following bolus administration of intravenous contrast. RADIATION DOSE REDUCTION: This exam was performed according to the departmental dose-optimization program which includes automated exposure control, adjustment of the mA and/or kV according to patient size and/or use of iterative reconstruction technique. CONTRAST:  110mL OMNIPAQUE IOHEXOL 300 MG/ML  SOLN COMPARISON:  CT 02/04/2008 FINDINGS: Lower chest: No acute abnormality. Hepatobiliary: No focal liver abnormality is seen. Gallbladder is unremarkable. Pancreas: Unremarkable. No pancreatic ductal dilatation or surrounding inflammatory changes. Spleen: Normal in size without focal abnormality. Adrenals/Urinary Tract: Adrenal glands are unremarkable. There is moderate right hydroureteronephrosis due to an obstructing 4 mm stone in the right mid ureter. There are two large right renal cysts, measuring up to 7.3 cm and 5.6 cm, the latter of which has thin peripheral calcification but no visible internal septation. These are likely benign. There is an additional 1.4 cm simple cyst in the lower pole. There is a nonobstructive 2 mm stone in the right lower pole. Mild prominence of the left renal collecting system. No left ureter stone. Subcentimeter exophytic left anterior renal simple cyst. The bladder is unremarkable. Stomach/Bowel: The stomach is  within normal limits. There is no evidence of bowel obstruction.The appendix is normal. Sigmoid diverticulosis. No evidence of diverticulitis. Vascular/Lymphatic: Aortoiliac atherosclerotic calcifications. No lymphadenopathy. 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 Reproductive: Prior prostate surgery. Other: No bowel containing hernia.  No ascites.  No free air. Musculoskeletal: No suspicious lytic or blastic lesions. Multilevel degenerative changes of the spine. There is an age indeterminate anterior compression deformity of T11 with superimposed superior endplate Schmorl's node. Bilateral hip osteoarthritis. IMPRESSION: Moderate right hydroureteronephrosis due to an obstructing 4 mm stone in the right mid ureter. Additional nonobstructive 2 mm stone in the lower pole of the right  kidney. Age-indeterminate anterior compression deformity of T11, correlate with localized back pain. Electronically Signed   By: Maurine Simmering M.D.   On: 08/09/2021 12:04   DG Chest Port 1 View  Result Date: 08/09/2021 CLINICAL DATA:  Ruq pain EXAM: PORTABLE CHEST 1 VIEW COMPARISON:  07/05/2020. FINDINGS: No consolidation. No visible pleural effusions or pneumothorax. Cardiomediastinal silhouette is within normal limits. No evidence of acute osseous abnormality. IMPRESSION: No evidence of acute cardiopulmonary disease. Electronically Signed   By: Margaretha Sheffield M.D.   On: 08/09/2021 10:12    Procedures Procedures    Medications Ordered in ED Medications  morphine (PF) 2 MG/ML injection 2 mg (2 mg Intravenous Not Given 08/09/21 1033)  ondansetron (ZOFRAN) injection 4 mg (4 mg Intravenous Not Given 08/09/21 1034)  sodium chloride 0.9 % bolus 1,000 mL (0 mLs Intravenous Stopped 08/09/21 1121)  iohexol (OMNIPAQUE) 300 MG/ML solution 100 mL (100 mLs Intravenous Contrast Given 08/09/21 1126)    ED Course/ Medical Decision Making/ A&P                            Medical Decision Making Amount and/or Complexity of Data Reviewed Labs: ordered. Radiology: ordered.  Risk Prescription drug management.   Patient is a 81 y.o. male with a cc of right-sided abdominal discomfort.  Going on for about 3 to 4 days.  He thought it might of been a kidney stone.  Has had these in the past.  Worse when he pushes on the area.  Could be musculoskeletal based on history.  We will obtain a laboratory evaluation CT scan to assess for appendicitis or cholecystitis.  UA.  Chest x-ray to evaluate for right lower lobe pathology.  I reviewed the patient's records and he does have a history of esophageal dysmotility and has been seeing GI for this.  He also has a history of dementia.  Most recent admission was about a year ago for fever of unknown origin.  I independently interpreted the patients labs and imaging UA negative for infection.  LFTs and lipase are unremarkable.  CT of the abdomen pelvis with a 4 mm stone in the mid ureter.  Discussed results with the patient.  We will have him follow-up with urology.  3:35 PM:  I have discussed the diagnosis/risks/treatment options with the patient.  Evaluation and diagnostic testing in the emergency department does not suggest an emergent condition requiring admission or immediate intervention beyond what has been performed at this time.  They will follow up with Urology. We also discussed returning to the ED immediately if new or worsening sx occur. We discussed the sx which are most concerning (e.g., sudden worsening pain, fever, inability to tolerate by mouth) that necessitate immediate return. Medications administered to the patient during their visit and any new prescriptions provided to the patient are listed below.  Medications given during this visit Medications  morphine (PF) 2 MG/ML injection 2 mg (2 mg Intravenous Not Given 08/09/21 1033)  ondansetron (ZOFRAN) injection 4 mg (4 mg Intravenous Not Given 08/09/21 1034)   sodium chloride 0.9 % bolus 1,000 mL (0 mLs Intravenous Stopped 08/09/21 1121)  iohexol (OMNIPAQUE) 300 MG/ML solution 100 mL (100 mLs Intravenous Contrast Given 08/09/21 1126)     The patient appears reasonably screen and/or stabilized for discharge and I doubt any other medical condition or other [facility] requiring further screening, evaluation, or treatment in the ED at this time prior to discharge.  Final Clinical Impression(s) / ED Diagnoses Final diagnoses:  Nephrolithiasis    Rx / DC Orders ED Discharge Orders     None         Deno Etienne, DO 08/09/21 1535

## 2021-08-12 DIAGNOSIS — N200 Calculus of kidney: Secondary | ICD-10-CM | POA: Diagnosis not present

## 2021-08-13 DIAGNOSIS — G44209 Tension-type headache, unspecified, not intractable: Secondary | ICD-10-CM | POA: Diagnosis not present

## 2021-08-13 DIAGNOSIS — M542 Cervicalgia: Secondary | ICD-10-CM | POA: Diagnosis not present

## 2021-08-13 DIAGNOSIS — M9902 Segmental and somatic dysfunction of thoracic region: Secondary | ICD-10-CM | POA: Diagnosis not present

## 2021-08-13 DIAGNOSIS — M9901 Segmental and somatic dysfunction of cervical region: Secondary | ICD-10-CM | POA: Diagnosis not present

## 2021-08-26 DIAGNOSIS — N200 Calculus of kidney: Secondary | ICD-10-CM | POA: Diagnosis not present

## 2021-08-28 DIAGNOSIS — R1319 Other dysphagia: Secondary | ICD-10-CM | POA: Diagnosis not present

## 2021-09-17 DIAGNOSIS — M160 Bilateral primary osteoarthritis of hip: Secondary | ICD-10-CM | POA: Diagnosis not present

## 2021-09-17 DIAGNOSIS — N200 Calculus of kidney: Secondary | ICD-10-CM | POA: Diagnosis not present

## 2021-09-17 DIAGNOSIS — K5730 Diverticulosis of large intestine without perforation or abscess without bleeding: Secondary | ICD-10-CM | POA: Diagnosis not present

## 2021-09-17 DIAGNOSIS — N281 Cyst of kidney, acquired: Secondary | ICD-10-CM | POA: Diagnosis not present

## 2021-09-23 ENCOUNTER — Telehealth: Payer: Self-pay | Admitting: Neurology

## 2021-09-23 NOTE — Telephone Encounter (Signed)
Pt's wife, Alexande Sheerin (on [place]) PCP has requesting pt take Prolia shot. Asking if that will interfere with pt's dementia medications. Would like a call back. ?

## 2021-09-23 NOTE — Telephone Encounter (Signed)
No interactions identified from prolia and namenda nor prolia and aricept per [employer]. Will confirm with Judson Roch, NP. ?

## 2021-09-24 DIAGNOSIS — M542 Cervicalgia: Secondary | ICD-10-CM | POA: Diagnosis not present

## 2021-09-24 DIAGNOSIS — I10 Essential (primary) hypertension: Secondary | ICD-10-CM | POA: Diagnosis not present

## 2021-09-24 DIAGNOSIS — M9902 Segmental and somatic dysfunction of thoracic region: Secondary | ICD-10-CM | POA: Diagnosis not present

## 2021-09-24 DIAGNOSIS — H9193 Unspecified hearing loss, bilateral: Secondary | ICD-10-CM | POA: Diagnosis not present

## 2021-09-24 DIAGNOSIS — M8580 Other specified disorders of bone density and structure, unspecified site: Secondary | ICD-10-CM | POA: Diagnosis not present

## 2021-09-24 DIAGNOSIS — E559 Vitamin D deficiency, unspecified: Secondary | ICD-10-CM | POA: Diagnosis not present

## 2021-09-24 DIAGNOSIS — H6123 Impacted cerumen, bilateral: Secondary | ICD-10-CM | POA: Diagnosis not present

## 2021-09-24 DIAGNOSIS — G44209 Tension-type headache, unspecified, not intractable: Secondary | ICD-10-CM | POA: Diagnosis not present

## 2021-09-24 DIAGNOSIS — M9901 Segmental and somatic dysfunction of cervical region: Secondary | ICD-10-CM | POA: Diagnosis not present

## 2021-09-24 NOTE — Telephone Encounter (Signed)
I called patient, spoke with patient's wife, Patrick Macias, per DPR. I advised her that Judson Roch, NP agreed that there is no interaction between prolia and namenda nor aricept. ?

## 2021-09-30 ENCOUNTER — Other Ambulatory Visit: Payer: Self-pay | Admitting: Physician Assistant

## 2021-09-30 DIAGNOSIS — D472 Monoclonal gammopathy: Secondary | ICD-10-CM

## 2021-10-01 ENCOUNTER — Other Ambulatory Visit: Payer: Self-pay

## 2021-10-01 ENCOUNTER — Inpatient Hospital Stay: Payer: Medicare HMO

## 2021-10-01 ENCOUNTER — Inpatient Hospital Stay: Payer: Medicare HMO | Attending: Physician Assistant | Admitting: Physician Assistant

## 2021-10-01 VITALS — BP 104/79 | HR 58 | Temp 97.8°F | Resp 17 | Wt 177.8 lb

## 2021-10-01 DIAGNOSIS — Z803 Family history of malignant neoplasm of breast: Secondary | ICD-10-CM | POA: Diagnosis not present

## 2021-10-01 DIAGNOSIS — Z9079 Acquired absence of other genital organ(s): Secondary | ICD-10-CM | POA: Insufficient documentation

## 2021-10-01 DIAGNOSIS — D539 Nutritional anemia, unspecified: Secondary | ICD-10-CM | POA: Diagnosis not present

## 2021-10-01 DIAGNOSIS — D472 Monoclonal gammopathy: Secondary | ICD-10-CM | POA: Insufficient documentation

## 2021-10-01 LAB — CMP (CANCER CENTER ONLY)
ALT: 35 U/L (ref 0–44)
AST: 37 U/L (ref 15–41)
Albumin: 4.4 g/dL (ref 3.5–5.0)
Alkaline Phosphatase: 48 U/L (ref 38–126)
Anion gap: 5 (ref 5–15)
BUN: 27 mg/dL — ABNORMAL HIGH (ref 8–23)
CO2: 28 mmol/L (ref 22–32)
Calcium: 9.3 mg/dL (ref 8.9–10.3)
Chloride: 108 mmol/L (ref 98–111)
Creatinine: 1.02 mg/dL (ref 0.61–1.24)
GFR, Estimated: >60 mL/min (ref >60)
Glucose, Bld: 118 mg/dL — ABNORMAL HIGH (ref 70–99)
Potassium: 4.2 mmol/L (ref 3.5–5.1)
Sodium: 141 mmol/L (ref 135–145)
Total Bilirubin: 1 mg/dL (ref 0.3–1.2)
Total Protein: 7.2 g/dL (ref 6.5–8.1)

## 2021-10-01 LAB — CBC WITH DIFFERENTIAL (CANCER CENTER ONLY)
Abs Immature Granulocytes: 0.01 10*3/uL (ref 0.00–0.07)
Basophils Absolute: 0 10*3/uL (ref 0.0–0.1)
Basophils Relative: 1 %
Eosinophils Absolute: 0.2 10*3/uL (ref 0.0–0.5)
Eosinophils Relative: 5 %
HCT: 37.9 % — ABNORMAL LOW (ref 39.0–52.0)
Hemoglobin: 12.7 g/dL — ABNORMAL LOW (ref 13.0–17.0)
Immature Granulocytes: 0 %
Lymphocytes Relative: 34 %
Lymphs Abs: 1.3 10*3/uL (ref 0.7–4.0)
MCH: 34.5 pg — ABNORMAL HIGH (ref 26.0–34.0)
MCHC: 33.5 g/dL (ref 30.0–36.0)
MCV: 103 fL — ABNORMAL HIGH (ref 80.0–100.0)
Monocytes Absolute: 0.4 10*3/uL (ref 0.1–1.0)
Monocytes Relative: 10 %
Neutro Abs: 2 10*3/uL (ref 1.7–7.7)
Neutrophils Relative %: 50 %
Platelet Count: 185 10*3/uL (ref 150–400)
RBC: 3.68 MIL/uL — ABNORMAL LOW (ref 4.22–5.81)
RDW: 13 % (ref 11.5–15.5)
WBC Count: 4 10*3/uL (ref 4.0–10.5)
nRBC: 0 % (ref 0.0–0.2)

## 2021-10-01 LAB — LACTATE DEHYDROGENASE: LDH: 178 U/L (ref 98–192)

## 2021-10-01 NOTE — Progress Notes (Signed)
?[facility] ?[phone]   [phone] ? ?PROGRESS NOTE ? ?Patient Care Team: ?Crist Infante, MD as PCP - General (Internal Medicine) ?Orson Slick, MD as Consulting Physician (Hematology and Oncology) ? ?Hematological/Oncological History ?1) Labs from PCP, Dr. Pati Gallo from [facility].  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  ? ?2) 12/28/2020: Establish care with Dede Query PA-C ? ?3) 01/18/2021: Bone marrow biopsy: Normocellular marrow with mild polytypic plasmocytosis.  ? ?CHIEF COMPLAINTS/PURPOSE OF CONSULTATION:  ?"Monoclonal gammopathy of unknown significance " ? ?HISTORY OF PRESENTING ILLNESS:  ?Patrick Macias 81 y.o. male returns today for a follow up for monoclonal gammopathy.  Patient is accompanied by his wife for this visit. ? ?On exam today, Mr. Sehgal reports that he is doing well without any changes to his health. His energy and appetite are fairly stable. He reports that he continues to complete his daily activities on his own. He denies nausea, vomiting or abdominal pain.  His bowel movements are unchanged without recurrent episodes of diarrhea or constipation.  He denies easy bruising or signs of bleeding. He has chronic neck and low back pain that comes and goes.  He denies any fevers, chills, night sweats, shortness of breath, chest pain, cough, rash, headaches, dizziness or syncopal episodes. He has no other complaints.  Rest of the 10 point ROS is below. ? ?MEDICAL HISTORY:  ?Past Medical History:  ?Diagnosis Date  ? Anxiety   ? BPH (benign prostatic hyperplasia)   ? Cerumen impaction   ? Dyslipidemia   ? H/O lithotripsy   ? Hearing decreased, bilateral   ? Hx of colonic polyps   ? IFG (impaired fasting glucose)   ? Macrocytosis   ? Memory loss   ? Nephrolithiasis   ? Vitamin D deficiency   ? ? ?SURGICAL HISTORY: ?Past Surgical History:  ?Procedure Laterality Date   ? CATARACT EXTRACTION, BILATERAL    ? PROSTATECTOMY    ? Partial  ? ? ?SOCIAL HISTORY: ?Social History  ? ?Socioeconomic History  ? Marital status: Married  ?  Spouse name: Patrick Macias  ? Number of children: 2  ? Years of education: college  ? Highest education level: Bachelor's degree (e.g., BA, AB, BS)  ?Occupational History  ? Occupation: Insurance   ?Tobacco Use  ? Smoking status: Never  ? Smokeless tobacco: Never  ?Substance and Sexual Activity  ? Alcohol use: Not Currently  ? Drug use: Never  ? Sexual activity: Not on file  ?Other Topics Concern  ? Not on file  ?Social History Narrative  ? Lives at home with his wife.  ? Left-handed.  ? Two cups caffeine per day.  ? ?Social Determinants of Health  ? ?Financial Resource Strain: Not on file  ?Food Insecurity: Not on file  ?Transportation Needs: Not on file  ?Physical Activity: Not on file  ?Stress: Not on file  ?Social Connections: Not on file  ?Intimate Partner Violence: Not on file  ? ? ?FAMILY HISTORY: ?Family History  ?Problem Relation Age of Onset  ? Leukemia Mother   ? Breast cancer Mother   ? Other Father   ?     unknown medical history  ? Sudden death Neg Hx   ? Hypertension Neg Hx   ? Hyperlipidemia Neg Hx   ? Diabetes Neg Hx   ? Heart attack Neg Hx   ? ? ?ALLERGIES:  has No Known Allergies. ? ?  MEDICATIONS:  ?Current Outpatient Medications  ?Medication Sig Dispense Refill  ? acetaminophen (TYLENOL) 500 MG tablet Take 1,000 mg by mouth every 6 (six) hours as needed for headache (pain).    ? aspirin EC 81 MG tablet Take 1 tablet (81 mg total) by mouth daily. 90 tablet 3  ? bisacodyl (DULCOLAX) 10 MG suppository Place 1 suppository (10 mg total) rectally daily as needed for moderate constipation. 12 suppository 0  ? buPROPion (WELLBUTRIN XL) 150 MG 24 hr tablet Take 150 mg by mouth every morning.    ? cholecalciferol (VITAMIN D3) 25 MCG (1000 UNIT) tablet Take 1,000 Units by mouth daily.    ? donepezil (ARICEPT) 10 MG tablet Take 1 tablet (10 mg total) by mouth  at bedtime. 90 tablet 4  ? escitalopram (LEXAPRO) 20 MG tablet Take 20 mg by mouth every morning.    ? memantine (NAMENDA) 10 MG tablet Take 1 tablet (10 mg total) by mouth 2 (two) times daily. 180 tablet 4  ? Multiple Vitamin (MULTIVITAMIN) tablet Take 1 tablet by mouth daily.    ? rosuvastatin (CRESTOR) 20 MG tablet Take 20 mg by mouth daily.    ? ?No current facility-administered medications for this visit.  ? ? ?REVIEW OF SYSTEMS:   ?Constitutional: ( - ) fevers, ( - )  chills , ( - ) night sweats ?Eyes: ( - ) blurriness of vision, ( - ) double vision, ( - ) watery eyes ?Ears, nose, mouth, throat, and face: ( - ) mucositis, ( - ) sore throat ?Respiratory: ( - ) cough, ( - ) dyspnea, ( - ) wheezes ?Cardiovascular: ( - ) palpitation, ( - ) chest discomfort, ( - ) lower extremity swelling ?Gastrointestinal:  ( - ) nausea, ( - ) heartburn, ( - ) change in bowel habits ?Skin: ( - ) abnormal skin rashes ?Lymphatics: ( - ) new lymphadenopathy, ( - ) easy bruising ?Neurological: ( - ) numbness, ( - ) tingling, ( - ) new weaknesses ?Behavioral/Psych: ( - ) mood change, ( - ) new changes  ?All other systems were reviewed with the patient and are negative. ? ?PHYSICAL EXAMINATION: ?ECOG PERFORMANCE STATUS: 0 - Asymptomatic ? ?Vitals:  ? 10/01/21 1035  ?BP: 104/79  ?Pulse: (!) 58  ?Resp: 17  ?Temp: 97.8 ?F (36.6 ?C)  ?SpO2: 100%  ? ?Filed Weights  ? 10/01/21 1035  ?Weight: 177 lb 12.8 oz (80.6 kg)  ? ? ?GENERAL: well appearing male in NAD  ?SKIN: skin color, texture, turgor are normal, no rashes or significant lesions ?EYES: conjunctiva are pink and non-injected, sclera clear ?OROPHARYNX: no exudate, no erythema; lips, buccal mucosa, and tongue normal  ?LYMPH:  no palpable lymphadenopathy in the cervical or supraclavicular lymph nodes.  ?LUNGS: clear to auscultation and percussion with normal breathing effort ?HEART: regular rate & rhythm and no murmurs and no lower extremity edema ?Musculoskeletal: no cyanosis of digits  and no clubbing  ?PSYCH: alert & oriented x 3, fluent speech ?NEURO: no focal motor/sensory deficits ? ?LABORATORY DATA:  ?I have reviewed the data as listed ? ?  Latest Ref Rng & Units 10/01/2021  ? 10:04 AM 08/09/2021  ?  9:59 AM 04/02/2021  ? 10:09 AM  ?CBC  ?WBC 4.0 - 10.5 K/uL 4.0   5.1   4.6    ?Hemoglobin 13.0 - 17.0 g/dL 12.7   14.2   11.5    ?Hematocrit 39.0 - 52.0 % 37.9   42.1   33.4    ?  Platelets 150 - 400 K/uL 185   200   237    ? ? ? ?  Latest Ref Rng & Units 10/01/2021  ? 10:04 AM 08/09/2021  ?  9:59 AM 04/02/2021  ? 10:09 AM  ?CMP  ?Glucose 70 - 99 mg/dL 118   112   96    ?BUN 8 - 23 mg/dL _0 ?Creatinine 0.61 - 1.24 mg/dL 1.02   1.00   1.06    ?Sodium 135 - 145 mmol/L 141   141   138    ?Potassium 3.5 - 5.1 mmol/L 4.2   3.8   4.1    ?Chloride 98 - 111 mmol/L 108   107   106    ?CO2 22 - 32 mmol/L _1 ?Calcium 8.9 - 10.3 mg/dL 9.3   9.3   9.1    ?Total Protein 6.5 - 8.1 g/dL 7.2   7.6   7.3    ?Total Bilirubin 0.3 - 1.2 mg/dL 1.0   1.0   1.3    ?Alkaline Phos 38 - 126 U/L 48   49   52    ?AST 15 - 41 U/L 37   35   39    ?ALT 0 - 44 U/L 35   34   36    ? ?ASSESSMENT & PLAN ?ZHAMIR PIRRO  is a 81 y.o. male returns for a follow up for monoclonal gammopathy.  ?  ?#MGUS: ?--SPEP from 12/28/2020 showed elevated M-protein of 1.0.  IFE showed IgG monoclonal protein with kappa light chain specificity. Kappa/Lambda light chain ratio was elevated at 2.46. UPEP showed no m-protein but positive bence jones proteins, kappa type.  ?--DG bone met survey from 01/03/2021 showed no evidence of lytic lesions. Next one due in July 2023 ?--Bone marrow biopsy was recommended due to elevated serum free light chain ration of 2.46. Patient underwent biopsy on 01/18/2021. Pathology revealed normocellular marrow with mild polytypic plasmocytosis. Findings consistent with MGUS.  ?--Most recent UPEP from 04/09/2021 showed bence jones proteins, kappa type. ?-Labs from today show overall stable anemia with Hgb 12.7.  CMP shows normal creatinine, calcium levels. Pending SPEP with IFE, serum free light chains. I will call patient with results once available.  ?-RTC in 6 months unless pending labs require further work

## 2021-10-02 LAB — KAPPA/LAMBDA LIGHT CHAINS
Kappa free light chain: 33.3 mg/L — ABNORMAL HIGH (ref 3.3–19.4)
Kappa, lambda light chain ratio: 3.08 — ABNORMAL HIGH (ref 0.26–1.65)
Lambda free light chains: 10.8 mg/L (ref 5.7–26.3)

## 2021-10-04 ENCOUNTER — Telehealth: Payer: Self-pay

## 2021-10-04 LAB — MULTIPLE MYELOMA PANEL, SERUM
Albumin SerPl Elph-Mcnc: 4.1 g/dL (ref 2.9–4.4)
Albumin/Glob SerPl: 1.6 (ref 0.7–1.7)
Alpha 1: 0.2 g/dL (ref 0.0–0.4)
Alpha2 Glob SerPl Elph-Mcnc: 0.5 g/dL (ref 0.4–1.0)
B-Globulin SerPl Elph-Mcnc: 0.7 g/dL (ref 0.7–1.3)
Gamma Glob SerPl Elph-Mcnc: 1.3 g/dL (ref 0.4–1.8)
Globulin, Total: 2.7 g/dL (ref 2.2–3.9)
IgA: 72 mg/dL (ref 61–437)
IgG (Immunoglobin G), Serum: 1335 mg/dL (ref 603–1613)
IgM (Immunoglobulin M), Srm: 63 mg/dL (ref 15–143)
M Protein SerPl Elph-Mcnc: 0.8 g/dL — ABNORMAL HIGH
Total Protein ELP: 6.8 g/dL (ref 6.0–8.5)

## 2021-10-04 NOTE — Telephone Encounter (Signed)
-----   Message from Lincoln Brigham, PA-C sent at 10/04/2021 12:38 PM EDT ----- ?Please notify patient that M protein is stable. Continue to monitor and see patient back in 6 months.  ?

## 2021-10-04 NOTE — Telephone Encounter (Signed)
Pt's wife, [name], advised and VU ?

## 2021-10-21 DIAGNOSIS — R9431 Abnormal electrocardiogram [ECG] [EKG]: Secondary | ICD-10-CM | POA: Diagnosis not present

## 2021-10-21 DIAGNOSIS — I2510 Atherosclerotic heart disease of native coronary artery without angina pectoris: Secondary | ICD-10-CM | POA: Diagnosis not present

## 2021-10-21 DIAGNOSIS — D472 Monoclonal gammopathy: Secondary | ICD-10-CM | POA: Diagnosis not present

## 2021-10-21 DIAGNOSIS — I6990 Unspecified sequelae of unspecified cerebrovascular disease: Secondary | ICD-10-CM | POA: Diagnosis not present

## 2021-10-21 DIAGNOSIS — E538 Deficiency of other specified B group vitamins: Secondary | ICD-10-CM | POA: Diagnosis not present

## 2021-10-21 DIAGNOSIS — I700 Atherosclerosis of aorta: Secondary | ICD-10-CM | POA: Diagnosis not present

## 2021-10-21 DIAGNOSIS — E559 Vitamin D deficiency, unspecified: Secondary | ICD-10-CM | POA: Diagnosis not present

## 2021-10-21 DIAGNOSIS — M8580 Other specified disorders of bone density and structure, unspecified site: Secondary | ICD-10-CM | POA: Diagnosis not present

## 2021-10-21 DIAGNOSIS — I10 Essential (primary) hypertension: Secondary | ICD-10-CM | POA: Diagnosis not present

## 2021-10-21 DIAGNOSIS — I6381 Other cerebral infarction due to occlusion or stenosis of small artery: Secondary | ICD-10-CM | POA: Diagnosis not present

## 2021-10-21 DIAGNOSIS — E876 Hypokalemia: Secondary | ICD-10-CM | POA: Diagnosis not present

## 2021-10-21 DIAGNOSIS — R972 Elevated prostate specific antigen [PSA]: Secondary | ICD-10-CM | POA: Diagnosis not present

## 2021-10-24 ENCOUNTER — Other Ambulatory Visit: Payer: Self-pay | Admitting: Gastroenterology

## 2021-10-24 ENCOUNTER — Other Ambulatory Visit (HOSPITAL_COMMUNITY): Payer: Self-pay | Admitting: Gastroenterology

## 2021-10-24 DIAGNOSIS — R1310 Dysphagia, unspecified: Secondary | ICD-10-CM | POA: Diagnosis not present

## 2021-10-24 DIAGNOSIS — D132 Benign neoplasm of duodenum: Secondary | ICD-10-CM | POA: Diagnosis not present

## 2021-10-25 ENCOUNTER — Other Ambulatory Visit (HOSPITAL_COMMUNITY): Payer: Self-pay | Admitting: *Deleted

## 2021-10-25 ENCOUNTER — Other Ambulatory Visit (HOSPITAL_COMMUNITY): Payer: Self-pay

## 2021-10-25 DIAGNOSIS — R1310 Dysphagia, unspecified: Secondary | ICD-10-CM

## 2021-11-06 ENCOUNTER — Ambulatory Visit (HOSPITAL_COMMUNITY)
Admission: RE | Admit: 2021-11-06 | Discharge: 2021-11-06 | Disposition: A | Discharge status: Home / Self Care | Payer: Medicare HMO | Source: Ambulatory Visit | Attending: Gastroenterology | Admitting: Gastroenterology

## 2021-11-06 DIAGNOSIS — R1310 Dysphagia, unspecified: Secondary | ICD-10-CM | POA: Diagnosis not present

## 2021-11-06 DIAGNOSIS — Z0389 Encounter for observation for other suspected diseases and conditions ruled out: Secondary | ICD-10-CM | POA: Diagnosis not present

## 2021-11-06 NOTE — Progress Notes (Signed)
Modified Barium Swallow Progress Note ? ?Patient Details  ?Name: Patrick Macias ?MRN: [id number] ?Date of Birth: July 20, 1940 ? ?Today's Date: 11/06/2021 ? ?Modified Barium Swallow completed.  Full report located under Chart Review in the Imaging Section. ? ?Brief recommendations include the following: ? ?Clinical Impression ? There was no oropharyngeal dysphagia present during study across textures of thin barium, applesauce, regular solid and barium pill. His hyolaryngeal elevation, epiglottic deflection and laryngeal closure was adequate and protective. There was age related, normal instances of flash penetration which exited laryngeal vestibule during the swallow and no aspiration present and no significant residue. Of note, pt cleared his throat frequently during  and after without airway intrusion.  MBS does not diagnose below the level of the UES however esophagus was scanned which did not reveal significant abnormalities on observation. Recommend he continue regular texture, thin liquids, pills with thin. Therapist reviewed results with pt and wife and handout re: esophageal precautions. MD, consider if reflux meds would be appropriate or not for this patient. No follow up needed. ?  ?Swallow Evaluation Recommendations ? ?   ? ? SLP Diet Recommendations: Regular solids;Thin liquid ? ? Liquid Administration via: Cup;Straw ? ? Medication Administration: Whole meds with liquid ? ? Supervision: Patient able to self feed ? ? Compensations: Slow rate;Small sips/bites ? ? Postural Changes: Seated upright at 90 degrees;Remain semi-upright after after feeds/meals (Comment) ? ? Oral Care Recommendations: Oral care BID ? ?   ? ? ? ?Houston Siren ?11/06/2021,3:30 PM ?

## 2021-11-13 ENCOUNTER — Other Ambulatory Visit (HOSPITAL_COMMUNITY): Payer: Self-pay | Admitting: *Deleted

## 2021-11-14 ENCOUNTER — Ambulatory Visit (HOSPITAL_COMMUNITY)
Admission: RE | Admit: 2021-11-14 | Discharge: 2021-11-14 | Disposition: A | Discharge status: Home / Self Care | Payer: Medicare HMO | Source: Ambulatory Visit | Attending: Internal Medicine | Admitting: Internal Medicine

## 2021-11-14 DIAGNOSIS — M810 Age-related osteoporosis without current pathological fracture: Secondary | ICD-10-CM | POA: Insufficient documentation

## 2021-11-14 MED ORDER — ZOLEDRONIC ACID 5 MG/100ML IV SOLN
INTRAVENOUS | Status: AC
  Administered 2021-11-14: 5 mg via INTRAVENOUS
  Filled 2021-11-14: qty 100

## 2021-11-14 MED ORDER — ZOLEDRONIC ACID 5 MG/100ML IV SOLN: 5.0000 mg | Freq: Once | INTRAVENOUS | Status: AC

## 2021-12-03 DIAGNOSIS — R8290 Unspecified abnormal findings in urine: Secondary | ICD-10-CM | POA: Diagnosis not present

## 2021-12-03 DIAGNOSIS — R339 Retention of urine, unspecified: Secondary | ICD-10-CM | POA: Diagnosis not present

## 2021-12-03 DIAGNOSIS — N200 Calculus of kidney: Secondary | ICD-10-CM | POA: Diagnosis not present

## 2022-01-23 DIAGNOSIS — R1310 Dysphagia, unspecified: Secondary | ICD-10-CM | POA: Diagnosis not present

## 2022-01-23 DIAGNOSIS — D132 Benign neoplasm of duodenum: Secondary | ICD-10-CM | POA: Diagnosis not present

## 2022-02-12 DIAGNOSIS — R198 Other specified symptoms and signs involving the digestive system and abdomen: Secondary | ICD-10-CM | POA: Diagnosis not present

## 2022-02-12 DIAGNOSIS — K319 Disease of stomach and duodenum, unspecified: Secondary | ICD-10-CM | POA: Diagnosis not present

## 2022-02-12 DIAGNOSIS — K2950 Unspecified chronic gastritis without bleeding: Secondary | ICD-10-CM | POA: Diagnosis not present

## 2022-02-12 DIAGNOSIS — R634 Abnormal weight loss: Secondary | ICD-10-CM | POA: Diagnosis not present

## 2022-03-04 DIAGNOSIS — R8290 Unspecified abnormal findings in urine: Secondary | ICD-10-CM | POA: Diagnosis not present

## 2022-03-04 DIAGNOSIS — R339 Retention of urine, unspecified: Secondary | ICD-10-CM | POA: Diagnosis not present

## 2022-03-04 DIAGNOSIS — N200 Calculus of kidney: Secondary | ICD-10-CM | POA: Diagnosis not present

## 2022-03-05 ENCOUNTER — Other Ambulatory Visit: Payer: Self-pay | Admitting: Gastroenterology

## 2022-03-05 DIAGNOSIS — Z1211 Encounter for screening for malignant neoplasm of colon: Secondary | ICD-10-CM | POA: Diagnosis not present

## 2022-03-05 DIAGNOSIS — R1310 Dysphagia, unspecified: Secondary | ICD-10-CM | POA: Diagnosis not present

## 2022-03-12 DIAGNOSIS — I10 Essential (primary) hypertension: Secondary | ICD-10-CM | POA: Diagnosis not present

## 2022-03-12 DIAGNOSIS — R001 Bradycardia, unspecified: Secondary | ICD-10-CM | POA: Diagnosis not present

## 2022-03-17 ENCOUNTER — Encounter: Payer: Self-pay | Admitting: Physician Assistant

## 2022-03-17 NOTE — Progress Notes (Unsigned)
Cardiology Office Note    Date:  03/18/2022   ID:  Patrick Macias, DOB [birth year], MRN [id number]  PCP:  Crist Infante, MD  Cardiologist:  Sinclair Grooms, MD  Electrophysiologist:  None   Chief Complaint: cardiology f/u, coronary calcification episodic slow HR  History of Present Illness:   Patrick Macias is a 81 y.o. male with history of coronary calcification, carotid artery disease, memory loss, anxiety, BPH, HTN, colon polyps, nephrolithasis, vit D deficiency, macrocytic anemia, MGUS, TIA who is seen for follow-up. He had a prior calcium score in 01/2019 with CAC 846 (75%ile), aortic atherosclerosis. He went on to have normal nuclear stress test 05/2019. Echocardiogram 05/2020 showed EF 60-65%, moderate RVE, normal PASP, dilated IVC, obtained during admission for possible TIA for which he was treated with ASA + Plavix for 3 weeks followed by ASA monotherapy. There was concern that this was either TIA versus related to his underlying cognitive impairment.  CT angio head/neck at that time had shown no large vessel occlusion (20-30% prox R ICA and left carotid bulb).  He is seen for follow-up today feeling well. He has noticed on 3 occasions when seeing other specialists or his PCP that his HR would be in the 40s on pulse ox. We registered 30's on ours today with EKG showing ventricular bigeminy. He denies any CP, SOB, palpitations, dizziness or syncope. Wife has noticed that after he has been working out in the yard for a while he will come inside and be completely worn out (I.e planting a ton of hydrangeas). He is generally very functional and active. There is no specific limitation during the activity itself. They had an OV yesterday with PCP with an EKG and were told he had PVCs. We are working on getting a copy of that EKG as well. He and his wife have been married 61 years.  Labwork independently reviewed: 09/2021 Hgb 12.7, plt ok, K 4.2, Cr 1.02, LFTs ok KPN 03/2021 LDL 34 (followed by  PCP), TSH wnl  Cardiology Studies:   Studies reviewed are outlined and summarized above. Reports included below if pertinent.   2D echo 05/2020    1. Left ventricular ejection fraction, by estimation, is 60 to 65%. The  left ventricle has normal function. The left ventricle has no regional  wall motion abnormalities. Left ventricular diastolic parameters were  normal.   2. Right ventricular systolic function is normal. The right ventricular  size is moderately enlarged. There is normal pulmonary artery systolic  pressure. The estimated right ventricular systolic pressure is 44.0 mmHg.   3. Right atrial size was mildly dilated.   4. The mitral valve is normal in structure. No evidence of mitral valve  regurgitation.   5. The aortic valve is tricuspid. Aortic valve regurgitation is trivial.  No aortic stenosis is present.   6. The inferior vena cava is dilated in size with >50% respiratory  variability, suggesting right atrial pressure of 8 mmHg.   Nuc 05/2019 Nuclear stress EF: 64%. No T wave inversion was noted during stress. There was no ST segment deviation noted during stress. This is a low risk study.   No reversible ischemia. LVEF 64% with normal wall motion. This is a low risk study.  Calcium score 01/2019 EXAM: CT HEART FOR CALCIUM SCORING   TECHNIQUE: CT heart was performed using prospective ECG gating.   A non-contrast exam for calcium scoring was performed.   Note that this exam targets the heart and the  chest was not imaged in its entirety.   COMPARISON:  None.   FINDINGS: Technical quality: Good.   CORONARY CALCIUM   Total Agatston Score: 846   MESA database percentile: 75th   OTHER FINDINGS:   Aortic atherosclerosis. Within the visualized portions of the thorax there are no suspicious appearing pulmonary nodules or masses, there is no acute consolidative airspace disease, no pleural effusions, no pneumothorax and no lymphadenopathy. Visualized  portions of the upper abdomen are unremarkable. There are no aggressive appearing lytic or blastic lesions noted in the visualized portions of the skeleton.   IMPRESSION: 1. Patient's total coronary artery calcium score is 846 which is 75th percentile for patient's of matched age, gender and race/ethnicity. Please note that although the presence of coronary artery calcium documents the presence of coronary artery disease, the severity of this disease and any potential stenosis cannot be assessed on this noncontrast CT examination. Assessment for potential risk factor modification, dietary therapy or pharmacologic therapy may be warranted, if clinically indicated. 2.  Aortic Atherosclerosis (ICD10-I70.0).     Electronically Signed   By: Vinnie Langton M.D.   On: 02/04/2019 14:07    Past Medical History:  Diagnosis Date   Anxiety    Aortic atherosclerosis (HCC)    BPH (benign prostatic hyperplasia)    Carotid atherosclerosis    Cerumen impaction    Coronary artery calcification    Dyslipidemia    H/O lithotripsy    Hearing decreased, bilateral    Hx of colonic polyps    IFG (impaired fasting glucose)    Macrocytic anemia    Macrocytosis    Memory loss    MGUS (monoclonal gammopathy of unknown significance)    Nephrolithiasis    TIA (transient ischemic attack)    Vitamin D deficiency     Past Surgical History:  Procedure Laterality Date   CATARACT EXTRACTION, BILATERAL     PROSTATECTOMY     Partial    Current Medications: Current Meds  Medication Sig   acetaminophen (TYLENOL) 500 MG tablet Take 1,000 mg by mouth every 6 (six) hours as needed for headache (pain).   aspirin EC 81 MG tablet Take 1 tablet (81 mg total) by mouth daily.   bisacodyl (DULCOLAX) 10 MG suppository Place 1 suppository (10 mg total) rectally daily as needed for moderate constipation.   buPROPion (WELLBUTRIN XL) 150 MG 24 hr tablet Take 150 mg by mouth every morning.   cholecalciferol  (VITAMIN D3) 25 MCG (1000 UNIT) tablet Take 1,000 Units by mouth daily.   donepezil (ARICEPT) 10 MG tablet Take 1 tablet (10 mg total) by mouth at bedtime.   escitalopram (LEXAPRO) 20 MG tablet Take 20 mg by mouth every morning.   memantine (NAMENDA) 10 MG tablet Take 1 tablet (10 mg total) by mouth 2 (two) times daily.   Multiple Vitamin (MULTIVITAMIN) tablet Take 1 tablet by mouth daily.   rosuvastatin (CRESTOR) 20 MG tablet Take 20 mg by mouth daily.      Allergies:   Patient has no known allergies.   Social History   Socioeconomic History   Marital status: Married    Spouse name: [name]   Number of children: 2   Years of education: college   Highest education level: Bachelor's degree (e.g., BA, AB, BS)  Occupational History   Occupation: Insurance   Tobacco Use   Smoking status: Never   Smokeless tobacco: Never  Substance and Sexual Activity   Alcohol use: Not Currently   Drug use:  Never   Sexual activity: Not on file  Other Topics Concern   Not on file  Social History Narrative   Lives at home with his wife.   Left-handed.   Two cups caffeine per day.   Social Determinants of Health   Financial Resource Strain: Not on file  Food Insecurity: Not on file  Transportation Needs: Not on file  Physical Activity: Not on file  Stress: Not on file  Social Connections: Not on file     Family History:  The patient's family history includes Breast cancer in his mother; Leukemia in his mother; Other in his father. There is no history of Sudden death, Hypertension, Hyperlipidemia, Diabetes, or Heart attack.  ROS:   Please see the history of present illness.  All other systems are reviewed and otherwise negative.    EKG(s)/Additional Labs   EKG:  EKG is ordered today, personally reviewed, demonstrating NSR 64bpm with ventricular bigeminy, otherwise nonspecific STTW changes  Recent Labs: 10/01/2021: ALT 35; BUN 27; Creatinine 1.02; Hemoglobin 12.7; Platelet Count 185;  Potassium 4.2; Sodium 141  Recent Lipid Panel    Component Value Date/Time   CHOL 86 06/02/2020 0145   TRIG 91 06/02/2020 0145   HDL 36 (L) 06/02/2020 0145   CHOLHDL 2.4 06/02/2020 0145   VLDL 18 06/02/2020 0145   LDLCALC 32 06/02/2020 0145    PHYSICAL EXAM:    VS:  BP (!) 120/56   Pulse 64   Ht '5\' 11"'$  (1.803 m)   Wt 179 lb (81.2 kg)   SpO2 99%   BMI 24.97 kg/m   BMI: Body mass index is 24.97 kg/m.  GEN: Well nourished, well developed male in no acute distress HEENT: normocephalic, atraumatic Neck: no JVD or masses. Faint right carotid bruit Cardiac: regularly irregular with ventricular bigeminy; no murmurs, rubs, or gallops, no edema  Respiratory:  clear to auscultation bilaterally, normal work of breathing GI: soft, nontender, nondistended, + BS MS: no deformity or atrophy Skin: warm and dry, no rash Neuro:  Alert and Oriented x 3, Strength and sensation are intact, follows commands Psych: euthymic mood, full affect  Wt Readings from Last 3 Encounters:  03/18/22 179 lb (81.2 kg)  10/01/21 177 lb 12.8 oz (80.6 kg)  08/09/21 177 lb (80.3 kg)     ASSESSMENT & PLAN:   1. Frequent PVCs - recently has seen phenomena of HR registering 40s on pulse ox - similar to today's visit but with EKG showing ventricular bigeminy. 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 We will plan basic labs to ensure thyroid, lytes OK, arrange 3 day Zio to assess HR excursion and burden, and update 2D echocardiogram for completeness. If burden is fairly low and EF is normal, would favor conservative approach, but if high burden or new LV dysfunction would entertain low dose beta blocker if HR permissive by monitor trends.  2. Essential HTN - controlled on present regimen.  3. Coronary artery calcification - no recent accelerating anginal symptoms reported. Post-exertion fatigue is nonspecific. No concerning symptoms during the activity itself. Prior NST was reassuring.  Await echo as above, otherwise continue medical therapy. Lipids are managed by PCP. Continue ASA, statin.  4. Carotid artery disease - faint R carotid bruit heard. Will update carotid duplex. Remains on ASA, statin with lipids followed by PCP.    Disposition: F/u with me after above testing.   Medication Adjustments/Labs and Tests Ordered: Current medicines are reviewed at length with the patient today.  Concerns regarding medicines are outlined above. Medication changes, Labs and Tests ordered today are summarized above and listed in the Patient Instructions accessible in Encounters.   Signed, Charlie Pitter, PA-C  03/18/2022 10:43 AM    [place] Phone: [phone]; Fax: [phone]

## 2022-03-18 ENCOUNTER — Ambulatory Visit: Payer: Medicare HMO | Attending: Physician Assistant | Admitting: Physician Assistant

## 2022-03-18 ENCOUNTER — Encounter: Payer: Self-pay | Admitting: Physician Assistant

## 2022-03-18 VITALS — BP 120/56 | HR 64 | Ht 71.0 in | Wt 179.0 lb

## 2022-03-18 DIAGNOSIS — I493 Ventricular premature depolarization: Secondary | ICD-10-CM | POA: Diagnosis not present

## 2022-03-18 DIAGNOSIS — I10 Essential (primary) hypertension: Secondary | ICD-10-CM | POA: Diagnosis not present

## 2022-03-18 DIAGNOSIS — I2584 Coronary atherosclerosis due to calcified coronary lesion: Secondary | ICD-10-CM

## 2022-03-18 DIAGNOSIS — I6523 Occlusion and stenosis of bilateral carotid arteries: Secondary | ICD-10-CM

## 2022-03-18 DIAGNOSIS — I2510 Atherosclerotic heart disease of native coronary artery without angina pectoris: Secondary | ICD-10-CM

## 2022-03-18 DIAGNOSIS — Z8673 Personal history of transient ischemic attack (TIA), and cerebral infarction without residual deficits: Secondary | ICD-10-CM

## 2022-03-18 NOTE — Patient Instructions (Signed)
Medication Instructions:  Your physician recommends that you continue on your current medications as directed. Please refer to the Current Medication list given to you today. *If you need a refill on your cardiac medications before your next appointment, please call your pharmacy*   Lab Work: TODAY-BMET, MAG, CBC, TSH If you have labs (blood work) drawn today and your tests are completely normal, you will receive your results only by: [place] (if you have MyChart) OR A paper copy in the mail If you have any lab test that is abnormal or we need to change your treatment, we will call you to review the results.   Testing/Procedures: Your physician has requested that you have an echocardiogram. Echocardiography is a painless test that uses sound waves to create images of your heart. It provides your doctor with information about the size and shape of your heart and how well your heart's chambers and valves are working. This procedure takes approximately one hour. There are no restrictions for this procedure.  Your physician has requested that you have a carotid duplex. This test is an ultrasound of the carotid arteries in your neck. It looks at blood flow through these arteries that supply the brain with blood. Allow one hour for this exam. There are no restrictions or special instructions.  ZIO XT- Long Term Monitor Instructions  Your physician has requested you wear a ZIO patch monitor for 14 days.  This is a single patch monitor. [employer] supplies one patch monitor per enrollment. Additional stickers are not available. Please do not apply patch if you will be having a Nuclear Stress Test,  Echocardiogram, Cardiac CT, MRI, or Chest Xray during the period you would be wearing the  monitor. The patch cannot be worn during these tests. You cannot remove and re-apply the  ZIO XT patch monitor.  Your ZIO patch monitor will be mailed 3 day USPS to your address on file. It may take 3-5 days   to receive your monitor after you have been enrolled.  Once you have received your monitor, please review the enclosed instructions. Your monitor  has already been registered assigning a specific monitor serial # to you.  Billing and Patient Assistance Program Information  We have supplied [employer] with any of your insurance information on file for billing purposes. [employer] offers a sliding scale Patient Assistance Program for patients that do not have  insurance, or whose insurance does not completely cover the cost of the ZIO monitor.  You must apply for the Patient Assistance Program to qualify for this discounted rate.  To apply, please call [employer] at [phone], select option 4, select option 2, ask to apply for  Patient Assistance Program. Theodore Demark will ask your household income, and how many people  are in your household. They will quote your out-of-pocket cost based on that information.  [employer] will also be able to set up a 85-month interest-free payment plan if needed.  Applying the monitor   Shave hair from upper left chest.  Hold abrader disc by orange tab. Rub abrader in 40 strokes over the upper left chest as  indicated in your monitor instructions.  Clean area with 4 enclosed alcohol pads. Let dry.  Apply patch as indicated in monitor instructions. Patch will be placed under collarbone on left  side of chest with arrow pointing upward.  Rub patch adhesive wings for 2 minutes. Remove white label marked "1". Remove the white  label marked "2". Rub patch adhesive wings for 2 additional  minutes.  While looking in a mirror, press and release button in center of patch. A small green light will  flash 3-4 times. This will be your only indicator that the monitor has been turned on.  Do not shower for the first 24 hours. You may shower after the first 24 hours.  Press the button if you feel a symptom. You will hear a small click. Record Date, Time and  Symptom in the Patient  Logbook.  When you are ready to remove the patch, follow instructions on the last 2 pages of Patient  Logbook. Stick patch monitor onto the last page of Patient Logbook.  Place Patient Logbook in the blue and white box. Use locking tab on box and tape box closed  securely. The blue and white box has prepaid postage on it. Please place it in the mailbox as  soon as possible. Your physician should have your test results approximately 7 days after the  monitor has been mailed back to [facility] [place].  Call [place] at [phone] if you have questions regarding  your ZIO XT patch monitor. Call them immediately if you see an orange light blinking on your  monitor.  If your monitor falls off in less than 4 days, contact our Monitor department at [phone].  If your monitor becomes loose or falls off after 4 days call [employer] at [phone] for  suggestions on securing your monitor    Follow-Up: At [facility], you and your health needs are our priority.  As part of our continuing mission to provide you with exceptional heart care, we have created designated Provider Care Teams.  These Care Teams include your primary Cardiologist (physician) and Advanced Practice Providers (APPs -  Physician Assistants and Nurse Practitioners) who all work together to provide you with the care you need, when you need it.  We recommend signing up for the patient portal called "MyChart".  Sign up information is provided on this After Visit Summary.  MyChart is used to connect with patients for Virtual Visits (Telemedicine).  Patients are able to view lab/test results, encounter notes, upcoming appointments, etc.  Non-urgent messages can be sent to your provider as well.   To learn more about what you can do with MyChart, go to [email or web address].    Your next appointment:   FOLLOW UP AFTER ALL TESTING      The format for your next appointment:   In Person  Provider:    Melina Copa, PA-C      Other Instructions   Important Information About Sugar

## 2022-03-19 LAB — BASIC METABOLIC PANEL
BUN/Creatinine Ratio: 23 (ref 10–24)
BUN: 25 mg/dL (ref 8–27)
CO2: 24 mmol/L (ref 20–29)
Calcium: 8.7 mg/dL (ref 8.6–10.2)
Chloride: 105 mmol/L (ref 96–106)
Creatinine, Ser: 1.09 mg/dL (ref 0.76–1.27)
Glucose: 78 mg/dL (ref 70–99)
Potassium: 4.3 mmol/L (ref 3.5–5.2)
Sodium: 140 mmol/L (ref 134–144)
eGFR: 69 mL/min/{1.73_m2} (ref >59)

## 2022-03-19 LAB — CBC
Hematocrit: 37.6 % (ref 37.5–51.0)
Hemoglobin: 12.7 g/dL — ABNORMAL LOW (ref 13.0–17.7)
MCH: 35.4 pg — ABNORMAL HIGH (ref 26.6–33.0)
MCHC: 33.8 g/dL (ref 31.5–35.7)
MCV: 105 fL — ABNORMAL HIGH (ref 79–97)
Platelets: 233 10*3/uL (ref 150–450)
RBC: 3.59 x10E6/uL — ABNORMAL LOW (ref 4.14–5.80)
RDW: 13.1 % (ref 11.6–15.4)
WBC: 5.7 10*3/uL (ref 3.4–10.8)

## 2022-03-19 LAB — MAGNESIUM: Magnesium: 2.4 mg/dL — ABNORMAL HIGH (ref 1.6–2.3)

## 2022-03-19 LAB — TSH: TSH: 0.795 u[IU]/mL (ref 0.450–4.500)

## 2022-03-20 ENCOUNTER — Telehealth: Payer: Self-pay | Admitting: Interventional Cardiology

## 2022-03-20 NOTE — Telephone Encounter (Signed)
Faxed and notified wife (DPR)

## 2022-03-20 NOTE — Telephone Encounter (Signed)
Wife calling to get paitient lab results sent to Dr. Joylene Draft office. Please advise

## 2022-03-25 ENCOUNTER — Inpatient Hospital Stay: Payer: Medicare HMO | Attending: Hematology and Oncology

## 2022-03-25 ENCOUNTER — Other Ambulatory Visit: Payer: Self-pay

## 2022-03-25 DIAGNOSIS — D472 Monoclonal gammopathy: Secondary | ICD-10-CM | POA: Diagnosis not present

## 2022-03-25 LAB — CBC WITH DIFFERENTIAL (CANCER CENTER ONLY)
Abs Immature Granulocytes: 0.02 10*3/uL (ref 0.00–0.07)
Basophils Absolute: 0 10*3/uL (ref 0.0–0.1)
Basophils Relative: 1 %
Eosinophils Absolute: 0.2 10*3/uL (ref 0.0–0.5)
Eosinophils Relative: 4 %
HCT: 36.5 % — ABNORMAL LOW (ref 39.0–52.0)
Hemoglobin: 12.5 g/dL — ABNORMAL LOW (ref 13.0–17.0)
Immature Granulocytes: 0 %
Lymphocytes Relative: 39 %
Lymphs Abs: 1.9 10*3/uL (ref 0.7–4.0)
MCH: 36 pg — ABNORMAL HIGH (ref 26.0–34.0)
MCHC: 34.2 g/dL (ref 30.0–36.0)
MCV: 105.2 fL — ABNORMAL HIGH (ref 80.0–100.0)
Monocytes Absolute: 0.6 10*3/uL (ref 0.1–1.0)
Monocytes Relative: 11 %
Neutro Abs: 2.2 10*3/uL (ref 1.7–7.7)
Neutrophils Relative %: 45 %
Platelet Count: 213 10*3/uL (ref 150–400)
RBC: 3.47 MIL/uL — ABNORMAL LOW (ref 4.22–5.81)
RDW: 13.7 % (ref 11.5–15.5)
WBC Count: 4.8 10*3/uL (ref 4.0–10.5)
nRBC: 0 % (ref 0.0–0.2)

## 2022-03-25 LAB — CMP (CANCER CENTER ONLY)
ALT: 33 U/L (ref 0–44)
AST: 34 U/L (ref 15–41)
Albumin: 4.4 g/dL (ref 3.5–5.0)
Alkaline Phosphatase: 32 U/L — ABNORMAL LOW (ref 38–126)
Anion gap: 3 — ABNORMAL LOW (ref 5–15)
BUN: 29 mg/dL — ABNORMAL HIGH (ref 8–23)
CO2: 28 mmol/L (ref 22–32)
Calcium: 9 mg/dL (ref 8.9–10.3)
Chloride: 108 mmol/L (ref 98–111)
Creatinine: 1.1 mg/dL (ref 0.61–1.24)
GFR, Estimated: >60 mL/min (ref >60)
Glucose, Bld: 80 mg/dL (ref 70–99)
Potassium: 3.9 mmol/L (ref 3.5–5.1)
Sodium: 139 mmol/L (ref 135–145)
Total Bilirubin: 1.3 mg/dL — ABNORMAL HIGH (ref 0.3–1.2)
Total Protein: 7.3 g/dL (ref 6.5–8.1)

## 2022-03-25 LAB — LACTATE DEHYDROGENASE: LDH: 187 U/L (ref 98–192)

## 2022-03-26 LAB — KAPPA/LAMBDA LIGHT CHAINS
Kappa free light chain: 34.9 mg/L — ABNORMAL HIGH (ref 3.3–19.4)
Kappa, lambda light chain ratio: 2.6 — ABNORMAL HIGH (ref 0.26–1.65)
Lambda free light chains: 13.4 mg/L (ref 5.7–26.3)

## 2022-03-28 LAB — MULTIPLE MYELOMA PANEL, SERUM
Albumin SerPl Elph-Mcnc: 3.8 g/dL (ref 2.9–4.4)
Albumin/Glob SerPl: 1.4 (ref 0.7–1.7)
Alpha 1: 0.2 g/dL (ref 0.0–0.4)
Alpha2 Glob SerPl Elph-Mcnc: 0.5 g/dL (ref 0.4–1.0)
B-Globulin SerPl Elph-Mcnc: 0.8 g/dL (ref 0.7–1.3)
Gamma Glob SerPl Elph-Mcnc: 1.3 g/dL (ref 0.4–1.8)
Globulin, Total: 2.9 g/dL (ref 2.2–3.9)
IgA: 65 mg/dL (ref 61–437)
IgG (Immunoglobin G), Serum: 1304 mg/dL (ref 603–1613)
IgM (Immunoglobulin M), Srm: 69 mg/dL (ref 15–143)
M Protein SerPl Elph-Mcnc: 0.7 g/dL — ABNORMAL HIGH
Total Protein ELP: 6.7 g/dL (ref 6.0–8.5)

## 2022-03-31 ENCOUNTER — Other Ambulatory Visit (HOSPITAL_COMMUNITY): Payer: Self-pay | Admitting: Physician Assistant

## 2022-03-31 ENCOUNTER — Other Ambulatory Visit (HOSPITAL_COMMUNITY): Payer: Medicare HMO

## 2022-03-31 ENCOUNTER — Ambulatory Visit (HOSPITAL_COMMUNITY)
Admission: RE | Admit: 2022-03-31 | Discharge: 2022-03-31 | Disposition: A | Discharge status: Home / Self Care | Payer: Medicare HMO | Source: Ambulatory Visit | Attending: Cardiovascular Disease | Admitting: Cardiovascular Disease

## 2022-03-31 ENCOUNTER — Ambulatory Visit (HOSPITAL_COMMUNITY)
Admission: RE | Admit: 2022-03-31 | Payer: Medicare HMO | Source: Ambulatory Visit | Attending: Physician Assistant | Admitting: Physician Assistant

## 2022-03-31 DIAGNOSIS — I2584 Coronary atherosclerosis due to calcified coronary lesion: Secondary | ICD-10-CM | POA: Insufficient documentation

## 2022-03-31 DIAGNOSIS — I10 Essential (primary) hypertension: Secondary | ICD-10-CM | POA: Insufficient documentation

## 2022-03-31 DIAGNOSIS — I2510 Atherosclerotic heart disease of native coronary artery without angina pectoris: Secondary | ICD-10-CM | POA: Insufficient documentation

## 2022-03-31 DIAGNOSIS — I493 Ventricular premature depolarization: Secondary | ICD-10-CM | POA: Insufficient documentation

## 2022-03-31 DIAGNOSIS — I779 Disorder of arteries and arterioles, unspecified: Secondary | ICD-10-CM

## 2022-03-31 DIAGNOSIS — I6523 Occlusion and stenosis of bilateral carotid arteries: Secondary | ICD-10-CM | POA: Insufficient documentation

## 2022-04-01 ENCOUNTER — Ambulatory Visit (HOSPITAL_BASED_OUTPATIENT_CLINIC_OR_DEPARTMENT_OTHER): Payer: Medicare HMO

## 2022-04-01 DIAGNOSIS — I10 Essential (primary) hypertension: Secondary | ICD-10-CM

## 2022-04-01 DIAGNOSIS — I2584 Coronary atherosclerosis due to calcified coronary lesion: Secondary | ICD-10-CM | POA: Diagnosis not present

## 2022-04-01 DIAGNOSIS — I6523 Occlusion and stenosis of bilateral carotid arteries: Secondary | ICD-10-CM | POA: Diagnosis not present

## 2022-04-01 DIAGNOSIS — I493 Ventricular premature depolarization: Secondary | ICD-10-CM

## 2022-04-01 DIAGNOSIS — I2510 Atherosclerotic heart disease of native coronary artery without angina pectoris: Secondary | ICD-10-CM | POA: Diagnosis not present

## 2022-04-01 DIAGNOSIS — I779 Disorder of arteries and arterioles, unspecified: Secondary | ICD-10-CM | POA: Diagnosis not present

## 2022-04-01 LAB — ECHOCARDIOGRAM COMPLETE
Area-P 1/2: 3.42 cm2
P 1/2 time: 673 msec
S' Lateral: 3.8 cm

## 2022-04-04 ENCOUNTER — Other Ambulatory Visit: Payer: Medicare HMO

## 2022-04-10 ENCOUNTER — Inpatient Hospital Stay (HOSPITAL_COMMUNITY): Admission: RE | Admit: 2022-04-10 | Payer: Medicare HMO | Source: Ambulatory Visit

## 2022-04-16 ENCOUNTER — Encounter (HOSPITAL_COMMUNITY): Payer: Self-pay | Admitting: Gastroenterology

## 2022-04-16 ENCOUNTER — Encounter (HOSPITAL_COMMUNITY)
Admission: RE | Disposition: A | Discharge status: Home / Self Care | Payer: Self-pay | Source: Home / Self Care | Attending: Gastroenterology

## 2022-04-16 ENCOUNTER — Ambulatory Visit (HOSPITAL_COMMUNITY)
Admission: RE | Admit: 2022-04-16 | Discharge: 2022-04-16 | Disposition: A | Discharge status: Home / Self Care | Payer: Medicare HMO | Attending: Gastroenterology | Admitting: Gastroenterology

## 2022-04-16 DIAGNOSIS — R1310 Dysphagia, unspecified: Secondary | ICD-10-CM | POA: Insufficient documentation

## 2022-04-16 HISTORY — PX: ESOPHAGEAL MANOMETRY: SHX5429

## 2022-04-16 SURGERY — MANOMETRY, ESOPHAGUS

## 2022-04-16 MED ORDER — LIDOCAINE VISCOUS HCL 2 % MT SOLN
OROMUCOSAL | Status: AC
  Filled 2022-04-16: qty 15

## 2022-04-16 SURGICAL SUPPLY — 2 items
FACESHIELD LNG OPTICON STERILE (SAFETY) IMPLANT
GLOVE BIO SURGEON STRL SZ8 (GLOVE) ×2 IMPLANT

## 2022-04-16 NOTE — Progress Notes (Signed)
Esophageal manometry performed per protocol without complications.  Patient tolerated well. 

## 2022-04-18 ENCOUNTER — Inpatient Hospital Stay: Payer: Medicare HMO | Admitting: Hematology and Oncology

## 2022-04-20 ENCOUNTER — Encounter (HOSPITAL_COMMUNITY): Payer: Self-pay | Admitting: Gastroenterology

## 2022-04-22 DIAGNOSIS — R1310 Dysphagia, unspecified: Secondary | ICD-10-CM | POA: Diagnosis not present

## 2022-04-28 ENCOUNTER — Encounter: Payer: Self-pay | Admitting: Physician Assistant

## 2022-04-28 ENCOUNTER — Ambulatory Visit (HOSPITAL_COMMUNITY)
Admission: RE | Admit: 2022-04-28 | Discharge: 2022-04-28 | Disposition: A | Discharge status: Home / Self Care | Payer: Medicare HMO | Source: Ambulatory Visit | Attending: Physician Assistant | Admitting: Physician Assistant

## 2022-04-28 DIAGNOSIS — M170 Bilateral primary osteoarthritis of knee: Secondary | ICD-10-CM | POA: Diagnosis not present

## 2022-04-28 DIAGNOSIS — D472 Monoclonal gammopathy: Secondary | ICD-10-CM | POA: Insufficient documentation

## 2022-04-28 DIAGNOSIS — M160 Bilateral primary osteoarthritis of hip: Secondary | ICD-10-CM | POA: Diagnosis not present

## 2022-04-28 NOTE — Progress Notes (Unsigned)
Cardiology Office Note    Date:  04/30/2022   ID:  Patrick Macias, DOB [birth year], MRN [id number]  PCP:  Crist Infante, MD  Cardiologist:  Sinclair Grooms, MD  Electrophysiologist:  None   Chief Complaint: f/u PVCs  History of Present Illness:   Patrick Macias is a 81 y.o. male with history of coronary calcification, carotid artery disease, memory loss, anxiety, BPH, HTN, colon polyps, nephrolithasis, vit D deficiency, macrocytic anemia, MGUS, TIA, recent frequent PVCs, mild-moderate AI, mild MR who is seen for follow-up.   He had a prior calcium score in 01/2019 with CAC 846 (75%ile), aortic atherosclerosis. He went on to have normal nuclear stress test 05/2019. Echocardiogram 05/2020 showed EF 60-65%, moderate RVE, normal PASP, dilated IVC, obtained during admission for possible TIA for which he was treated with ASA + Plavix for 3 weeks followed by ASA monotherapy. There was concern that this was either TIA versus related to his underlying cognitive impairment.  CT angio head/neck at that time had shown no large vessel occlusion (20-30% prox R ICA and left carotid bulb). I recently met him in follow-up 02/2022. He had noticed recently registering HR 40s on pulse ox. In the office we were registering 30s but with EKG showing this was pseudobradycardia related to ventricular bigeminy, with PVCs just not registering on pulse ox. 2D echo 04/01/22 showed EF 50-55%, mildly enlarged RV, mild-moderate AI, mild MR. Carotid duplex 03/2022 for R Carotid bruit was reassuring with only 1-39% BICA. A 3 day monitor was requested but formal order did not get entered.   He is seen back for follow-up today with his wife. He denies any interim cardiac complaints. No CP, SOB, palpitations. His EKG continues to show NSR with frequent PVCs. Initial BP by CMA 90/56, recheck by me 114/68. It is much easier to appreciate Korotkoff sounds during periods when he is not having as much ectopy, which may have influenced the  initial reading.   Labwork independently reviewed: 02/2022 Hgb 12.5, plt ok, K 3.9, Cr 1.10, AST/ALT OK, TSH wnl, Mg 2.4   Cardiology Studies:   Studies reviewed are outlined and summarized above. Reports included below if pertinent.   Echo 04/01/22   1. Left ventricular ejection fraction, by estimation, is 50 to 55%. The  left ventricle has low normal function. The left ventricle has no regional  wall motion abnormalities. Left ventricular diastolic parameters are  indeterminate. The average left  ventricular global longitudinal strain is -23.2 %.   2. Right ventricular systolic function is normal. The right ventricular  size is mildly enlarged. There is normal pulmonary artery systolic  pressure. The estimated right ventricular systolic pressure is 92.9 mmHg.   3. The mitral valve is normal in structure. Mild mitral valve  regurgitation.   4. The aortic valve is tricuspid. Aortic valve regurgitation is mild to  moderate. No aortic stenosis is present.   5. The inferior vena cava is dilated in size with >50% respiratory  variability, suggesting right atrial pressure of 8 mmHg.   Carotid 03/31/22  Summary:  Right Carotid: Velocities in the right ICA are consistent with a 1-39%  stenosis.   Left Carotid: Velocities in the left ICA are consistent with a 1-39%  stenosis.   Vertebrals:  Bilateral vertebral arteries demonstrate antegrade flow.  Subclavians: Normal flow hemodynamics were seen in bilateral subclavian               arteries.   *See table(s) above for  measurements and observations.     Past Medical History:  Diagnosis Date   Anxiety    Aortic atherosclerosis (HCC)    BPH (benign prostatic hyperplasia)    Carotid atherosclerosis    Cerumen impaction    Coronary artery calcification    Dyslipidemia    Frequent PVCs    H/O lithotripsy    Hearing decreased, bilateral    Hx of colonic polyps    IFG (impaired fasting glucose)    Macrocytic anemia     Macrocytosis    Memory loss    MGUS (monoclonal gammopathy of unknown significance)    Nephrolithiasis    TIA (transient ischemic attack)    Vitamin D deficiency     Past Surgical History:  Procedure Laterality Date   CATARACT EXTRACTION, BILATERAL     ESOPHAGEAL MANOMETRY N/A 04/16/2022   Procedure: ESOPHAGEAL MANOMETRY (EM);  Surgeon: Arta Silence, MD;  Location: [facility];  Service: Gastroenterology;  Laterality: N/A;   PROSTATECTOMY     Partial    Current Medications: Current Meds  Medication Sig   acetaminophen (TYLENOL) 500 MG tablet Take 1,000 mg by mouth every 6 (six) hours as needed for headache (pain).   aspirin EC 81 MG tablet Take 1 tablet (81 mg total) by mouth daily.   bisacodyl (DULCOLAX) 10 MG suppository Place 1 suppository (10 mg total) rectally daily as needed for moderate constipation.   buPROPion (WELLBUTRIN XL) 150 MG 24 hr tablet Take 150 mg by mouth every morning.   cholecalciferol (VITAMIN D3) 25 MCG (1000 UNIT) tablet Take 1,000 Units by mouth daily.   donepezil (ARICEPT) 10 MG tablet Take 1 tablet (10 mg total) by mouth at bedtime.   escitalopram (LEXAPRO) 20 MG tablet Take 20 mg by mouth every morning.   memantine (NAMENDA) 10 MG tablet Take 1 tablet (10 mg total) by mouth 2 (two) times daily.   Multiple Vitamin (MULTIVITAMIN) tablet Take 1 tablet by mouth daily.   rosuvastatin (CRESTOR) 20 MG tablet Take 20 mg by mouth daily.      Allergies:   Patient has no known allergies.   Social History   Socioeconomic History   Marital status: Married    Spouse name: [name]   Number of children: 2   Years of education: college   Highest education level: Bachelor's degree (e.g., BA, AB, BS)  Occupational History   Occupation: Insurance   Tobacco Use   Smoking status: Never   Smokeless tobacco: Never  Substance and Sexual Activity   Alcohol use: Not Currently   Drug use: Never   Sexual activity: Not on file  Other Topics Concern   Not on file   Social History Narrative   Lives at home with his wife.   Left-handed.   Two cups caffeine per day.   Social Determinants of Health   Financial Resource Strain: Not on file  Food Insecurity: Not on file  Transportation Needs: Not on file  Physical Activity: Not on file  Stress: Not on file  Social Connections: Not on file     Family History:  The patient's family history includes Breast cancer in his mother; Leukemia in his mother; Other in his father. There is no history of Sudden death, Hypertension, Hyperlipidemia, Diabetes, or Heart attack.  ROS:   Please see the history of present illness. All other systems are reviewed and otherwise negative.    EKG(s)/Additional Labs   EKG:  EKG is ordered today, personally reviewed, demonstrating NSR 67bpm, frequent PVCs, otherwise  nonspecific STTW changes. Similar to prior.  Recent Labs: 03/18/2022: Magnesium 2.4; TSH 0.795 03/25/2022: ALT 33; BUN 29; Creatinine 1.10; Hemoglobin 12.5; Platelet Count 213; Potassium 3.9; Sodium 139  Recent Lipid Panel    Component Value Date/Time   CHOL 86 06/02/2020 0145   TRIG 91 06/02/2020 0145   HDL 36 (L) 06/02/2020 0145   CHOLHDL 2.4 06/02/2020 0145   VLDL 18 06/02/2020 0145   LDLCALC 32 06/02/2020 0145    PHYSICAL EXAM:    VS:  BP 114/68   Pulse 67   Ht _0  (1.803 m)   Wt 178 lb (80.7 kg)   SpO2 98%   BMI 24.83 kg/m   BMI: Body mass index is 24.83 kg/m.  GEN: Well nourished, well developed male in no acute distress HEENT: normocephalic, atraumatic Neck: no JVD, carotid bruits, or masses Cardiac: RRR, occasional ectopy; no murmurs, rubs, or gallops, no edema  Respiratory:  clear to auscultation bilaterally, normal work of breathing GI: soft, nontender, nondistended, + BS MS: no deformity or atrophy Skin: warm and dry, no rash Neuro:  Alert and Oriented x 3, Strength and sensation are intact, follows commands Psych: euthymic mood, full affect  Wt Readings from Last 3  Encounters:  04/30/22 178 lb (80.7 kg)  03/18/22 179 lb (81.2 kg)  10/01/21 177 lb 12.8 oz (80.6 kg)     ASSESSMENT & PLAN:   1. Frequent PVCs - recent labs reassuring, EF low-normal at 50-55%, no regional wall motion abnormalities. No recent accelerating anginal symptoms. We will continue the plan for 3 day monitor as previously discussed. Will await monitor results before advising on addition of metoprolol. Given softer BP upon arrival by CMA with recheck normal, they will follow BP at home in the meantime and will relay results to [place] when we call with monitor.   2. Mild RV enlargement - seen on prior echocardiogram as well, but was moderately enlarged then. It is now only mildly enlarged with normal PASP. Given comorbidities, in the absence of accelerating symptoms, conservative management is appropriate.  3. Essential HTN - controlled. Easiest to hear between periods of frequent ectopy.  4. Coronary artery calcification - no recent accelerating anginal symptoms. Able to do physical activity without symptoms during exertion. Lipids are managed by PCP. Continue ASA, statin.  5. Carotid artery disease - mild disease on duplex. Remains on ASA, statin with lipids followed by PCP. Consider repeat study in 2 years if clinically appropriate.  6. Mild MR, mild-moderate AI - follow clinically and consider repeat echocardiogram in 1 year.     Disposition: F/u with me in 3 months.   Medication Adjustments/Labs and Tests Ordered: Current medicines are reviewed at length with the patient today.  Concerns regarding medicines are outlined above. Medication changes, Labs and Tests ordered today are summarized above and listed in the Patient Instructions accessible in Encounters.   Signed, Charlie Pitter, PA-C  04/30/2022 11:22 AM    [place] Phone: [phone]; Fax: [phone]

## 2022-04-29 ENCOUNTER — Telehealth: Payer: Self-pay | Admitting: Interventional Cardiology

## 2022-04-29 NOTE — Telephone Encounter (Signed)
Spoke with pt's wife, DPR, and apologized for delay with monitor and advised RN will place order as initially requested by PA.  Pt's wife states she and pt will keep appointment scheduled for tomorrow but requests that monitor not be ordered until they meet with Melina Copa, PA.  Will forward information to PA to make her aware.  Pt's wife thanked Therapist, sports for the callback.

## 2022-04-29 NOTE — Telephone Encounter (Signed)
Patient's wife is calling wanting to know if the patient was supposed to wear the heart monitor previously discussed before his appt scheduled for tomorrow. She states the heart monitor was never sent to them. Please advise.

## 2022-04-30 ENCOUNTER — Ambulatory Visit (INDEPENDENT_AMBULATORY_CARE_PROVIDER_SITE_OTHER): Payer: Medicare HMO

## 2022-04-30 ENCOUNTER — Encounter: Payer: Self-pay | Admitting: Physician Assistant

## 2022-04-30 ENCOUNTER — Ambulatory Visit: Payer: Medicare HMO | Attending: Physician Assistant | Admitting: Physician Assistant

## 2022-04-30 VITALS — BP 114/68 | HR 67 | Ht 71.0 in | Wt 178.0 lb

## 2022-04-30 DIAGNOSIS — I10 Essential (primary) hypertension: Secondary | ICD-10-CM

## 2022-04-30 DIAGNOSIS — I517 Cardiomegaly: Secondary | ICD-10-CM

## 2022-04-30 DIAGNOSIS — I2584 Coronary atherosclerosis due to calcified coronary lesion: Secondary | ICD-10-CM

## 2022-04-30 DIAGNOSIS — I2510 Atherosclerotic heart disease of native coronary artery without angina pectoris: Secondary | ICD-10-CM

## 2022-04-30 DIAGNOSIS — I493 Ventricular premature depolarization: Secondary | ICD-10-CM

## 2022-04-30 DIAGNOSIS — I6523 Occlusion and stenosis of bilateral carotid arteries: Secondary | ICD-10-CM | POA: Diagnosis not present

## 2022-04-30 LAB — UPEP/UIFE/LIGHT CHAINS/TP, 24-HR UR
% BETA, Urine: 23.7 %
ALPHA 1 URINE: 4.5 %
Albumin, U: 37.7 %
Alpha 2, Urine: 16.1 %
Free Kappa Lt Chains,Ur: 100.01 mg/L — ABNORMAL HIGH (ref 1.17–86.46)
Free Kappa/Lambda Ratio: 23.15 — ABNORMAL HIGH (ref 1.83–14.26)
Free Lambda Lt Chains,Ur: 4.32 mg/L (ref 0.27–15.21)
GAMMA GLOBULIN URINE: 18 %
M-SPIKE %, Urine: 6.9 % — ABNORMAL HIGH
M-Spike, Mg/24 Hr: 9 mg/24 hr — ABNORMAL HIGH
Total Protein, Urine-Ur/day: 126 mg/24 hr (ref 30–150)
Total Protein, Urine: 15.7 mg/dL
Total Volume: 800

## 2022-04-30 NOTE — Progress Notes (Unsigned)
ZIO XT [id number] from office inventory applied to patient.  Dr. Tamala Julian to read.

## 2022-04-30 NOTE — Patient Instructions (Signed)
Medication Instructions:  Your physician recommends that you continue on your current medications as directed. Please refer to the Current Medication list given to you today. *If you need a refill on your cardiac medications before your next appointment, please call your pharmacy*   Lab Work: None Ordered   Testing/Procedures: Bryn Gulling- Long Term Monitor Instructions  Your physician has requested you wear a ZIO patch monitor for 14 days.  This is a single patch monitor. [employer] supplies one patch monitor per enrollment. Additional stickers are not available. Please do not apply patch if you will be having a Nuclear Stress Test,  Echocardiogram, Cardiac CT, MRI, or Chest Xray during the period you would be wearing the  monitor. The patch cannot be worn during these tests. You cannot remove and re-apply the  ZIO XT patch monitor.  Your ZIO patch monitor will be mailed 3 day USPS to your address on file. It may take 3-5 days  to receive your monitor after you have been enrolled.  Once you have received your monitor, please review the enclosed instructions. Your monitor  has already been registered assigning a specific monitor serial # to you.  Billing and Patient Assistance Program Information  We have supplied [employer] with any of your insurance information on file for billing purposes. [employer] offers a sliding scale Patient Assistance Program for patients that do not have  insurance, or whose insurance does not completely cover the cost of the ZIO monitor.  You must apply for the Patient Assistance Program to qualify for this discounted rate.  To apply, please call [employer] at [phone], select option 4, select option 2, ask to apply for  Patient Assistance Program. Theodore Demark will ask your household income, and how many people  are in your household. They will quote your out-of-pocket cost based on that information.  [employer] will also be able to set up a 92-month interest-free payment  plan if needed.  Applying the monitor   Shave hair from upper left chest.  Hold abrader disc by orange tab. Rub abrader in 40 strokes over the upper left chest as  indicated in your monitor instructions.  Clean area with 4 enclosed alcohol pads. Let dry.  Apply patch as indicated in monitor instructions. Patch will be placed under collarbone on left  side of chest with arrow pointing upward.  Rub patch adhesive wings for 2 minutes. Remove white label marked "1". Remove the white  label marked "2". Rub patch adhesive wings for 2 additional minutes.  While looking in a mirror, press and release button in center of patch. A small green light will  flash 3-4 times. This will be your only indicator that the monitor has been turned on.  Do not shower for the first 24 hours. You may shower after the first 24 hours.  Press the button if you feel a symptom. You will hear a small click. Record Date, Time and  Symptom in the Patient Logbook.  When you are ready to remove the patch, follow instructions on the last 2 pages of Patient  Logbook. Stick patch monitor onto the last page of Patient Logbook.  Place Patient Logbook in the blue and white box. Use locking tab on box and tape box closed  securely. The blue and white box has prepaid postage on it. Please place it in the mailbox as  soon as possible. Your physician should have your test results approximately 7 days after the  monitor [facility]  Call [place] at [phone] if you have questions regarding  your ZIO XT patch monitor. Call them immediately if you see an orange light blinking on your  monitor.  If your monitor falls off in less than 4 days, contact our Monitor department at [phone].  If your monitor becomes loose or falls off after 4 days call [employer] at [phone] for  suggestions on securing your monitor    Follow-Up: At [facility], you and your health  needs are our priority.  As part of our continuing mission to provide you with exceptional heart care, we have created designated Provider Care Teams.  These Care Teams include your primary Cardiologist (physician) and Advanced Practice Providers (APPs -  Physician Assistants and Nurse Practitioners) who all work together to provide you with the care you need, when you need it.  We recommend signing up for the patient portal called "MyChart".  Sign up information is provided on this After Visit Summary.  MyChart is used to connect with patients for Virtual Visits (Telemedicine).  Patients are able to view lab/test results, encounter notes, upcoming appointments, etc.  Non-urgent messages can be sent to your provider as well.   To learn more about what you can do with MyChart, go to [email or web address].    Your next appointment:   3 month(s)  The format for your next appointment:   In Person  Provider:   Melina Copa, PA-C        Other Instructions   Important Information About Sugar

## 2022-05-05 ENCOUNTER — Inpatient Hospital Stay: Payer: Medicare HMO

## 2022-05-05 ENCOUNTER — Other Ambulatory Visit: Payer: Self-pay | Admitting: Hematology and Oncology

## 2022-05-05 ENCOUNTER — Inpatient Hospital Stay: Payer: Medicare HMO | Attending: Hematology and Oncology | Admitting: Hematology and Oncology

## 2022-05-05 VITALS — BP 124/48 | HR 67 | Temp 98.0°F | Resp 17 | Ht 71.0 in | Wt 179.8 lb

## 2022-05-05 DIAGNOSIS — Z806 Family history of leukemia: Secondary | ICD-10-CM | POA: Insufficient documentation

## 2022-05-05 DIAGNOSIS — D472 Monoclonal gammopathy: Secondary | ICD-10-CM | POA: Insufficient documentation

## 2022-05-05 DIAGNOSIS — D539 Nutritional anemia, unspecified: Secondary | ICD-10-CM | POA: Insufficient documentation

## 2022-05-05 DIAGNOSIS — Z803 Family history of malignant neoplasm of breast: Secondary | ICD-10-CM | POA: Diagnosis not present

## 2022-05-05 LAB — CBC WITH DIFFERENTIAL (CANCER CENTER ONLY)
Abs Immature Granulocytes: 0.01 10*3/uL (ref 0.00–0.07)
Basophils Absolute: 0 10*3/uL (ref 0.0–0.1)
Basophils Relative: 1 %
Eosinophils Absolute: 0.2 10*3/uL (ref 0.0–0.5)
Eosinophils Relative: 5 %
HCT: 37.8 % — ABNORMAL LOW (ref 39.0–52.0)
Hemoglobin: 13.4 g/dL (ref 13.0–17.0)
Immature Granulocytes: 0 %
Lymphocytes Relative: 34 %
Lymphs Abs: 1.6 10*3/uL (ref 0.7–4.0)
MCH: 36.1 pg — ABNORMAL HIGH (ref 26.0–34.0)
MCHC: 35.4 g/dL (ref 30.0–36.0)
MCV: 101.9 fL — ABNORMAL HIGH (ref 80.0–100.0)
Monocytes Absolute: 0.5 10*3/uL (ref 0.1–1.0)
Monocytes Relative: 11 %
Neutro Abs: 2.2 10*3/uL (ref 1.7–7.7)
Neutrophils Relative %: 49 %
Platelet Count: 209 10*3/uL (ref 150–400)
RBC: 3.71 MIL/uL — ABNORMAL LOW (ref 4.22–5.81)
RDW: 12.7 % (ref 11.5–15.5)
WBC Count: 4.5 10*3/uL (ref 4.0–10.5)
nRBC: 0 % (ref 0.0–0.2)

## 2022-05-05 LAB — LACTATE DEHYDROGENASE: LDH: 201 U/L — ABNORMAL HIGH (ref 98–192)

## 2022-05-05 LAB — CMP (CANCER CENTER ONLY)
ALT: 34 U/L (ref 0–44)
AST: 37 U/L (ref 15–41)
Albumin: 4.2 g/dL (ref 3.5–5.0)
Alkaline Phosphatase: 38 U/L (ref 38–126)
Anion gap: 7 (ref 5–15)
BUN: 19 mg/dL (ref 8–23)
CO2: 24 mmol/L (ref 22–32)
Calcium: 9.3 mg/dL (ref 8.9–10.3)
Chloride: 108 mmol/L (ref 98–111)
Creatinine: 0.97 mg/dL (ref 0.61–1.24)
GFR, Estimated: >60 mL/min (ref >60)
Glucose, Bld: 108 mg/dL — ABNORMAL HIGH (ref 70–99)
Potassium: 4.2 mmol/L (ref 3.5–5.1)
Sodium: 139 mmol/L (ref 135–145)
Total Bilirubin: 1.1 mg/dL (ref 0.3–1.2)
Total Protein: 7 g/dL (ref 6.5–8.1)

## 2022-05-05 NOTE — Progress Notes (Signed)
[place] [phone]   [phone]  PROGRESS NOTE  Patient Care Team: Crist Infante, MD as PCP - General (Internal Medicine) Belva Crome, MD as PCP - Cardiology (Cardiology) Orson Slick, MD as Consulting Physician (Hematology and Oncology)  Hematological/Oncological History 1) Labs from PCP, Dr. Pati Gallo from [facility].  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   2) 12/28/2020: Establish care with Dede Query PA-C  3) 01/18/2021: Bone marrow biopsy: Normocellular marrow with mild polytypic plasmocytosis.   CHIEF COMPLAINTS/PURPOSE OF CONSULTATION:  "Monoclonal gammopathy of unknown significance "  HISTORY OF PRESENTING ILLNESS:  Patrick Macias 81 y.o. male returns today for a follow up for monoclonal gammopathy.  Patient is accompanied by his wife for this visit.  On exam today, Patrick Macias reports he has been well overall interim since our last visit in April 2023.  His wife reports that he is "about the same".  He notes that he recently had some heart issues and was wearing a heart monitor.  He sent off the monitor here recently.  He also unfortunately had some swallowing issues and had some recent testing done.  He was told that he needed Botox injections in his sphincter.  He reports his energy levels are good but it is "hit and miss with eating".  This is mostly due to the swallowing issues.  He notes that he feels food gets stuck at the top of his throat as well as down towards the bottom of his stomach.  He is not currently having any issues with bone or back pain.  His primary care provider recently started him on Samet infusions for low bone density.Marland Kitchen  He denies any fevers, chills, night sweats, shortness of breath, chest pain, cough, rash, headaches, dizziness or syncopal episodes. He has no other complaints.  Rest of the 10 point ROS is  below.  MEDICAL HISTORY:  Past Medical History:  Diagnosis Date   Anxiety    Aortic atherosclerosis (HCC)    BPH (benign prostatic hyperplasia)    Carotid atherosclerosis    Cerumen impaction    Coronary artery calcification    Dyslipidemia    Frequent PVCs    H/O lithotripsy    Hearing decreased, bilateral    Hx of colonic polyps    IFG (impaired fasting glucose)    Macrocytic anemia    Macrocytosis    Memory loss    MGUS (monoclonal gammopathy of unknown significance)    Nephrolithiasis    TIA (transient ischemic attack)    Vitamin D deficiency     SURGICAL HISTORY: Past Surgical History:  Procedure Laterality Date   CATARACT EXTRACTION, BILATERAL     ESOPHAGEAL MANOMETRY N/A 04/16/2022   Procedure: ESOPHAGEAL MANOMETRY (EM);  Surgeon: Arta Silence, MD;  Location: [facility];  Service: Gastroenterology;  Laterality: N/A;   PROSTATECTOMY     Partial    SOCIAL HISTORY: Social History   Socioeconomic History   Marital status: Married    Spouse name: Patrick Macias   Number of children: 2   Years of education: college   Highest education level: Bachelor's degree (e.g., BA, AB, BS)  Occupational History   Occupation: Insurance   Tobacco Use   Smoking status: Never   Smokeless tobacco: Never  Substance and Sexual Activity   Alcohol use: Not Currently   Drug use: Never   Sexual activity: Not on file  Other Topics  Concern   Not on file  Social History Narrative   Lives at home with his wife.   Left-handed.   Two cups caffeine per day.   Social Determinants of Health   Financial Resource Strain: Not on file  Food Insecurity: Not on file  Transportation Needs: Not on file  Physical Activity: Not on file  Stress: Not on file  Social Connections: Not on file  Intimate Partner Violence: Not on file    FAMILY HISTORY: Family History  Problem Relation Age of Onset   Leukemia Mother    Breast cancer Mother    Other Father        unknown medical history    Sudden death Neg Hx    Hypertension Neg Hx    Hyperlipidemia Neg Hx    Diabetes Neg Hx    Heart attack Neg Hx     ALLERGIES:  has No Known Allergies.  MEDICATIONS:  Current Outpatient Medications  Medication Sig Dispense Refill   acetaminophen (TYLENOL) 500 MG tablet Take 1,000 mg by mouth every 6 (six) hours as needed for headache (pain).     aspirin EC 81 MG tablet Take 1 tablet (81 mg total) by mouth daily. 90 tablet 3   bisacodyl (DULCOLAX) 10 MG suppository Place 1 suppository (10 mg total) rectally daily as needed for moderate constipation. 12 suppository 0   buPROPion (WELLBUTRIN XL) 150 MG 24 hr tablet Take 150 mg by mouth every morning.     cholecalciferol (VITAMIN D3) 25 MCG (1000 UNIT) tablet Take 1,000 Units by mouth daily.     donepezil (ARICEPT) 10 MG tablet Take 1 tablet (10 mg total) by mouth at bedtime. 90 tablet 4   escitalopram (LEXAPRO) 20 MG tablet Take 20 mg by mouth every morning.     memantine (NAMENDA) 10 MG tablet Take 1 tablet (10 mg total) by mouth 2 (two) times daily. 180 tablet 4   Multiple Vitamin (MULTIVITAMIN) tablet Take 1 tablet by mouth daily.     rosuvastatin (CRESTOR) 20 MG tablet Take 20 mg by mouth daily.     No current facility-administered medications for this visit.    REVIEW OF SYSTEMS:   Constitutional: ( - ) fevers, ( - )  chills , ( - ) night sweats Eyes: ( - ) blurriness of vision, ( - ) double vision, ( - ) watery eyes Ears, nose, mouth, throat, and face: ( - ) mucositis, ( - ) sore throat Respiratory: ( - ) cough, ( - ) dyspnea, ( - ) wheezes Cardiovascular: ( - ) palpitation, ( - ) chest discomfort, ( - ) lower extremity swelling Gastrointestinal:  ( - ) nausea, ( - ) heartburn, ( - ) change in bowel habits Skin: ( - ) abnormal skin rashes Lymphatics: ( - ) new lymphadenopathy, ( - ) easy bruising Neurological: ( - ) numbness, ( - ) tingling, ( - ) new weaknesses Behavioral/Psych: ( - ) mood change, ( - ) new changes  All other  systems were reviewed with the patient and are negative.  PHYSICAL EXAMINATION: ECOG PERFORMANCE STATUS: 0 - Asymptomatic  Vitals:   05/05/22 1108  BP: (!) 124/48  Pulse: 67  Resp: 17  Temp: 98 F (36.7 C)  SpO2: 98%    Filed Weights   05/05/22 1108  Weight: 179 lb 12.8 oz (81.6 kg)     GENERAL: well appearing male in NAD  SKIN: skin color, texture, turgor are normal, no rashes or significant lesions EYES:  conjunctiva are pink and non-injected, sclera clear OROPHARYNX: no exudate, no erythema; lips, buccal mucosa, and tongue normal  LYMPH:  no palpable lymphadenopathy in the cervical or supraclavicular lymph nodes.  LUNGS: clear to auscultation and percussion with normal breathing effort HEART: regular rate & rhythm and no murmurs and no lower extremity edema Musculoskeletal: no cyanosis of digits and no clubbing  PSYCH: alert & oriented x 3, fluent speech NEURO: no focal motor/sensory deficits  LABORATORY DATA:  I have reviewed the data as listed    Latest Ref Rng & Units 05/05/2022   10:30 AM 03/25/2022   10:20 AM 03/18/2022   11:14 AM  CBC  WBC 4.0 - 10.5 K/uL 4.5  4.8  5.7   Hemoglobin 13.0 - 17.0 g/dL 13.4  12.5  12.7   Hematocrit 39.0 - 52.0 % 37.8  36.5  37.6   Platelets 150 - 400 K/uL 209  213  233        Latest Ref Rng & Units 03/25/2022   10:20 AM 03/18/2022   11:14 AM 10/01/2021   10:04 AM  CMP  Glucose 70 - 99 mg/dL 80  78  118   BUN 8 - 23 mg/dL _0 Creatinine 0.61 - 1.24 mg/dL 1.10  1.09  1.02   Sodium 135 - 145 mmol/L 139  140  141   Potassium 3.5 - 5.1 mmol/L 3.9  4.3  4.2   Chloride 98 - 111 mmol/L 108  105  108   CO2 22 - 32 mmol/L _1 Calcium 8.9 - 10.3 mg/dL 9.0  8.7  9.3   Total Protein 6.5 - 8.1 g/dL 7.3   7.2   Total Bilirubin 0.3 - 1.2 mg/dL 1.3   1.0   Alkaline Phos 38 - 126 U/L 32   48   AST 15 - 41 U/L 34   37   ALT 0 - 44 U/L 33   35    ASSESSMENT & PLAN BERLE FITZ  is a 81 y.o. male returns for a follow up  for monoclonal gammopathy.    #IgG Kappa MGUS: --SPEP from 12/28/2020 showed elevated M-protein of 1.0.  IFE showed IgG monoclonal protein with kappa light chain specificity. Kappa/Lambda light chain ratio was elevated at 2.46. UPEP showed no m-protein but positive bence jones proteins, kappa type.  --DG bone met survey from 01/03/2021 showed no evidence of lytic lesions. Next one due in July 2023 --Bone marrow biopsy was recommended due to elevated serum free light chain ration of 2.46. Patient underwent biopsy on 01/18/2021. Pathology revealed normocellular marrow with mild polytypic plasmocytosis. Findings consistent with MGUS.  --Most recent UPEP from 04/09/2021 showed bence jones proteins, kappa type. -Labs from today show overall stable anemia with Hgb 13.4, white blood cell 4.5, MCV 101.9, and platelets of 209. -RTC in 6 months   #Macrocytic anemia: --Hgb is 13.4 with MCV 101.9. Overall stable in the last several months.  --Vitamin B12, folate, and methylmalonic levels were checked on 04/02/2021 without any deficiencies.  --Continue to monitor.   No orders of the defined types were placed in this encounter.   All questions were answered. The patient knows to call the clinic with any problems, questions or concerns.  I have spent a total of 25 minutes minutes of face-to-face and non-face-to-face time, preparing to see the patient, performing a medically appropriate examination, counseling and educating the patient, ordering tests, documenting clinical information in  the electronic health record, communicating results to the patient, and care coordination.    Ledell Peoples, MD Department of Hematology/Oncology [place] [facility] Phone: [phone] Pager: [phone] Email: [email or web address]

## 2022-05-06 LAB — KAPPA/LAMBDA LIGHT CHAINS
Kappa free light chain: 34.8 mg/L — ABNORMAL HIGH (ref 3.3–19.4)
Kappa, lambda light chain ratio: 2.92 — ABNORMAL HIGH (ref 0.26–1.65)
Lambda free light chains: 11.9 mg/L (ref 5.7–26.3)

## 2022-05-07 DIAGNOSIS — R7301 Impaired fasting glucose: Secondary | ICD-10-CM | POA: Diagnosis not present

## 2022-05-07 DIAGNOSIS — E538 Deficiency of other specified B group vitamins: Secondary | ICD-10-CM | POA: Diagnosis not present

## 2022-05-07 DIAGNOSIS — E785 Hyperlipidemia, unspecified: Secondary | ICD-10-CM | POA: Diagnosis not present

## 2022-05-07 DIAGNOSIS — R69 Illness, unspecified: Secondary | ICD-10-CM | POA: Diagnosis not present

## 2022-05-07 DIAGNOSIS — R7989 Other specified abnormal findings of blood chemistry: Secondary | ICD-10-CM | POA: Diagnosis not present

## 2022-05-07 DIAGNOSIS — E041 Nontoxic single thyroid nodule: Secondary | ICD-10-CM | POA: Diagnosis not present

## 2022-05-08 LAB — MULTIPLE MYELOMA PANEL, SERUM
Albumin SerPl Elph-Mcnc: 3.8 g/dL (ref 2.9–4.4)
Albumin/Glob SerPl: 1.4 (ref 0.7–1.7)
Alpha 1: 0.2 g/dL (ref 0.0–0.4)
Alpha2 Glob SerPl Elph-Mcnc: 0.5 g/dL (ref 0.4–1.0)
B-Globulin SerPl Elph-Mcnc: 0.8 g/dL (ref 0.7–1.3)
Gamma Glob SerPl Elph-Mcnc: 1.4 g/dL (ref 0.4–1.8)
Globulin, Total: 2.9 g/dL (ref 2.2–3.9)
IgA: 68 mg/dL (ref 61–437)
IgG (Immunoglobin G), Serum: 1339 mg/dL (ref 603–1613)
IgM (Immunoglobulin M), Srm: 61 mg/dL (ref 15–143)
M Protein SerPl Elph-Mcnc: 0.7 g/dL — ABNORMAL HIGH
Total Protein ELP: 6.7 g/dL (ref 6.0–8.5)

## 2022-05-09 ENCOUNTER — Telehealth: Payer: Self-pay | Admitting: *Deleted

## 2022-05-09 NOTE — Telephone Encounter (Signed)
-----   Message from Orson Slick, MD sent at 05/09/2022  9:51 AM EST ----- Please let Mr. Schlink know that his MGUS labs are stable.  His M protein has remained stable.  We will plan to see him back in November 2024.  If he were to have any issues or concerns in the interim we will be happy to see him back early. ----- Message ----- From: Buel Ream, [facility] [place] Sent: 05/05/2022  10:49 AM EST To: Orson Slick, MD

## 2022-05-09 NOTE — Telephone Encounter (Signed)
TCT patient regarding recent lab results. No answer  LVM for pt to return call at his convenience to [phone]

## 2022-05-12 NOTE — Telephone Encounter (Signed)
Received call back from pt's wife regarding recent lab results. Advised that her husband's MGUS labs are stable and that he does not need to come back until 05/2023. She voiced understanding.

## 2022-05-14 DIAGNOSIS — Z23 Encounter for immunization: Secondary | ICD-10-CM | POA: Diagnosis not present

## 2022-05-14 DIAGNOSIS — I700 Atherosclerosis of aorta: Secondary | ICD-10-CM | POA: Diagnosis not present

## 2022-05-14 DIAGNOSIS — G459 Transient cerebral ischemic attack, unspecified: Secondary | ICD-10-CM | POA: Diagnosis not present

## 2022-05-14 DIAGNOSIS — I10 Essential (primary) hypertension: Secondary | ICD-10-CM | POA: Diagnosis not present

## 2022-05-14 DIAGNOSIS — I6990 Unspecified sequelae of unspecified cerebrovascular disease: Secondary | ICD-10-CM | POA: Diagnosis not present

## 2022-05-14 DIAGNOSIS — R413 Other amnesia: Secondary | ICD-10-CM | POA: Diagnosis not present

## 2022-05-14 DIAGNOSIS — R82998 Other abnormal findings in urine: Secondary | ICD-10-CM | POA: Diagnosis not present

## 2022-05-14 DIAGNOSIS — I6381 Other cerebral infarction due to occlusion or stenosis of small artery: Secondary | ICD-10-CM | POA: Diagnosis not present

## 2022-05-14 DIAGNOSIS — E041 Nontoxic single thyroid nodule: Secondary | ICD-10-CM | POA: Diagnosis not present

## 2022-05-14 DIAGNOSIS — E785 Hyperlipidemia, unspecified: Secondary | ICD-10-CM | POA: Diagnosis not present

## 2022-05-14 DIAGNOSIS — D472 Monoclonal gammopathy: Secondary | ICD-10-CM | POA: Diagnosis not present

## 2022-05-14 DIAGNOSIS — I2510 Atherosclerotic heart disease of native coronary artery without angina pectoris: Secondary | ICD-10-CM | POA: Diagnosis not present

## 2022-05-14 DIAGNOSIS — Z1339 Encounter for screening examination for other mental health and behavioral disorders: Secondary | ICD-10-CM | POA: Diagnosis not present

## 2022-05-14 DIAGNOSIS — Z0000 Encounter for general adult medical examination without abnormal findings: Secondary | ICD-10-CM | POA: Diagnosis not present

## 2022-05-14 DIAGNOSIS — Z1331 Encounter for screening for depression: Secondary | ICD-10-CM | POA: Diagnosis not present

## 2022-05-16 DIAGNOSIS — I493 Ventricular premature depolarization: Secondary | ICD-10-CM | POA: Diagnosis not present

## 2022-06-03 DIAGNOSIS — R82998 Other abnormal findings in urine: Secondary | ICD-10-CM | POA: Diagnosis not present

## 2022-06-11 ENCOUNTER — Other Ambulatory Visit: Payer: Self-pay | Admitting: Gastroenterology

## 2022-06-11 DIAGNOSIS — D132 Benign neoplasm of duodenum: Secondary | ICD-10-CM | POA: Diagnosis not present

## 2022-06-11 DIAGNOSIS — R1310 Dysphagia, unspecified: Secondary | ICD-10-CM | POA: Diagnosis not present

## 2022-06-16 ENCOUNTER — Telehealth: Payer: Self-pay | Admitting: *Deleted

## 2022-06-16 DIAGNOSIS — I4729 Other ventricular tachycardia: Secondary | ICD-10-CM

## 2022-06-16 MED ORDER — METOPROLOL SUCCINATE ER 25 MG PO TB24: 25.0000 mg | ORAL_TABLET | Freq: Every day | ORAL | 3 refills | Status: DC

## 2022-06-16 NOTE — Telephone Encounter (Signed)
-----   Message from Emmaline Life, NP sent at 06/16/2022  4:24 PM EST ----- Covering for Melina Copa, PA. Cardiac monitor reveals some episodes of nonsustained ventricular tachycardia which did not last long, however you had 17 episodes in 5 days. No atrial fibrillation. Per [name]'s note, he was to monitor blood pressure at home. Would recommend that he start metoprolol succinate 25 mg once daily as long as systolic BP > 376. Recommend referral to EP for evaluation of NSVT.

## 2022-06-27 DIAGNOSIS — R051 Acute cough: Secondary | ICD-10-CM | POA: Diagnosis not present

## 2022-06-27 DIAGNOSIS — R5383 Other fatigue: Secondary | ICD-10-CM | POA: Diagnosis not present

## 2022-06-27 DIAGNOSIS — J069 Acute upper respiratory infection, unspecified: Secondary | ICD-10-CM | POA: Diagnosis not present

## 2022-06-27 DIAGNOSIS — R062 Wheezing: Secondary | ICD-10-CM | POA: Diagnosis not present

## 2022-06-27 DIAGNOSIS — I10 Essential (primary) hypertension: Secondary | ICD-10-CM | POA: Diagnosis not present

## 2022-06-27 DIAGNOSIS — J029 Acute pharyngitis, unspecified: Secondary | ICD-10-CM | POA: Diagnosis not present

## 2022-06-27 DIAGNOSIS — Z1152 Encounter for screening for COVID-19: Secondary | ICD-10-CM | POA: Diagnosis not present

## 2022-07-02 DIAGNOSIS — R051 Acute cough: Secondary | ICD-10-CM | POA: Diagnosis not present

## 2022-07-02 DIAGNOSIS — E871 Hypo-osmolality and hyponatremia: Secondary | ICD-10-CM | POA: Diagnosis not present

## 2022-07-02 DIAGNOSIS — R5383 Other fatigue: Secondary | ICD-10-CM | POA: Diagnosis not present

## 2022-07-02 DIAGNOSIS — J069 Acute upper respiratory infection, unspecified: Secondary | ICD-10-CM | POA: Diagnosis not present

## 2022-07-02 DIAGNOSIS — R410 Disorientation, unspecified: Secondary | ICD-10-CM | POA: Diagnosis not present

## 2022-07-02 DIAGNOSIS — Z1152 Encounter for screening for COVID-19: Secondary | ICD-10-CM | POA: Diagnosis not present

## 2022-07-02 DIAGNOSIS — J029 Acute pharyngitis, unspecified: Secondary | ICD-10-CM | POA: Diagnosis not present

## 2022-07-02 DIAGNOSIS — I10 Essential (primary) hypertension: Secondary | ICD-10-CM | POA: Diagnosis not present

## 2022-07-07 ENCOUNTER — Telehealth: Payer: Self-pay | Admitting: Physician Assistant

## 2022-07-07 DIAGNOSIS — I4729 Other ventricular tachycardia: Secondary | ICD-10-CM

## 2022-07-07 DIAGNOSIS — I493 Ventricular premature depolarization: Secondary | ICD-10-CM

## 2022-07-07 NOTE — Telephone Encounter (Signed)
Order placed for Myoview, Lexiscan as requested by Melina Copa, PA-C.  Message sent to precert and [facility] pool for scheduling

## 2022-07-07 NOTE — Telephone Encounter (Signed)
   I discussed pt's recent event monitor results with Dr. Myles Gip given 28% PVC burden/NSVT, regarding ischemic evaluation. Cor CT would be challenging with gating given frequent ectopy. Per our discussion, would recommend Lexiscan nuclear stress test to further evaluate. I called pt/wife. I was able to speak with wife who helps care for patient has he has memory issues and discussed procedure in full and got consent for the test. He has been currently getting over a URI (states "80% better") so would look to schedule at least 1 week out. I will route to triage to see if they can help me get this ordered and get full instructions out to patient and wife. [id number] order placed. They know to keep f/u with Dr. Myles Gip on 1/23 as scheduled. Wife won't be home again until later this afternoon so would try to call later in PM.  Shared Decision Making/Informed Consent The risks [chest pain, shortness of breath, cardiac arrhythmias, dizziness, blood pressure fluctuations, myocardial infarction, stroke/transient ischemic attack, nausea, vomiting, allergic reaction, radiation exposure, metallic taste sensation and life-threatening complications (estimated to be 1 in 10,000)], benefits (risk stratification, diagnosing coronary artery disease, treatment guidance) and alternatives of a nuclear stress test were discussed in detail with Mr. Granville wife as the patient is not able to reliably consent due to memory issues and she agrees to proceed.  Charlie Pitter, PA-C

## 2022-07-10 DIAGNOSIS — E785 Hyperlipidemia, unspecified: Secondary | ICD-10-CM | POA: Diagnosis not present

## 2022-07-10 DIAGNOSIS — I10 Essential (primary) hypertension: Secondary | ICD-10-CM | POA: Diagnosis not present

## 2022-07-14 ENCOUNTER — Telehealth: Payer: Self-pay | Admitting: Physician Assistant

## 2022-07-14 NOTE — Telephone Encounter (Signed)
Reviewed with Dr Myles Gip and he would like patient to continue Toprol.  Patient's wife notified.  She is aware of orthostatic precautions and that patient should stay well hydrated.  She will let us know if BP is less than 100.

## 2022-07-14 NOTE — Telephone Encounter (Signed)
STAT if patient feels like he/she is going to faint   Are you dizzy now? No he is in bed  Do you feel faint or have you passed out? no  Do you have any other symptoms? Has dementia so more hallucinations  Have you checked your HR and BP (record if available)? 46-48, not sure what it is now but is has stayed below 62   Patient's wife states the patient has been having dizziness twice a day and his HR has been staying below 50. She says this has been going on for the last 4 days. She says he has a stess test scheduled for Thursday and is not sure if this will influence the stress test.

## 2022-07-14 NOTE — Telephone Encounter (Signed)
Received call transferred directly from operator and spoke with patient's wife.  She reports patient has been having dizziness.  He also has hallucinations due to his dementia.  Hallucinations have not worsened recently. Patient started having dizziness last week.  Happens about twice a day.  Will sometimes happen when he is getting ready to stand up.  Other time happened when he was walking in the hall.  Was able to get to the sofa and felt better after sitting down. Orthostatic precautions reviewed.  Patient has also had an URI for the last 2 weeks.  Seems to be improving from this over the last week. Saw Dr Joylene Draft a couple of times recently. First time sodium was "low for him".  Patient started drinking V8 juice and eating popcorn.  After this sodium was normal when rechecked at Dr Perini's office.  Wife started checking patient's BP and heart rate on 12/13.  Readings below- 12/13-125/64, 48 12/14- 138/70, 46 12/14 PM-101/54, 48 Has not checked this morning and patient has not taken Toprol.   I advised wife to hole Toprol this AM. I told her it should be OK to have stress test as planned later this week.

## 2022-07-16 DIAGNOSIS — R3911 Hesitancy of micturition: Secondary | ICD-10-CM | POA: Diagnosis not present

## 2022-07-16 DIAGNOSIS — Z87442 Personal history of urinary calculi: Secondary | ICD-10-CM | POA: Diagnosis not present

## 2022-07-16 DIAGNOSIS — R3129 Other microscopic hematuria: Secondary | ICD-10-CM | POA: Diagnosis not present

## 2022-07-16 DIAGNOSIS — R339 Retention of urine, unspecified: Secondary | ICD-10-CM | POA: Diagnosis not present

## 2022-07-17 ENCOUNTER — Ambulatory Visit (HOSPITAL_COMMUNITY): Payer: Medicare HMO | Attending: Internal Medicine

## 2022-07-17 DIAGNOSIS — I4729 Other ventricular tachycardia: Secondary | ICD-10-CM | POA: Diagnosis not present

## 2022-07-17 DIAGNOSIS — I493 Ventricular premature depolarization: Secondary | ICD-10-CM | POA: Insufficient documentation

## 2022-07-17 LAB — MYOCARDIAL PERFUSION IMAGING
LV dias vol: 118 mL (ref 62–150)
LV sys vol: 55 mL
Nuc Stress EF: 54 %
Peak HR: 64 {beats}/min
Rest HR: 43 {beats}/min
Rest Nuclear Isotope Dose: 9.6 mCi
SDS: 0
SRS: 0
SSS: 0
ST Depression (mm): 0 mm
Stress Nuclear Isotope Dose: 31.7 mCi
TID: 0.96

## 2022-07-17 MED ORDER — TECHNETIUM TC 99M TETROFOSMIN IV KIT
31.7000 | PACK | Freq: Once | INTRAVENOUS | Status: AC | PRN
  Administered 2022-07-17: 31.7 via INTRAVENOUS

## 2022-07-17 MED ORDER — REGADENOSON 0.4 MG/5ML IV SOLN
0.4000 mg | Freq: Once | INTRAVENOUS | Status: AC
  Administered 2022-07-17: 0.4 mg via INTRAVENOUS

## 2022-07-17 MED ORDER — TECHNETIUM TC 99M TETROFOSMIN IV KIT
9.6000 | PACK | Freq: Once | INTRAVENOUS | Status: AC | PRN
  Administered 2022-07-17: 9.6 via INTRAVENOUS

## 2022-07-18 ENCOUNTER — Telehealth: Payer: Self-pay | Admitting: Cardiovascular Disease

## 2022-07-18 ENCOUNTER — Telehealth: Payer: Self-pay

## 2022-07-18 DIAGNOSIS — N200 Calculus of kidney: Secondary | ICD-10-CM | POA: Diagnosis not present

## 2022-07-18 DIAGNOSIS — R972 Elevated prostate specific antigen [PSA]: Secondary | ICD-10-CM | POA: Diagnosis not present

## 2022-07-18 NOTE — Telephone Encounter (Signed)
Left message for patient's wife to return call

## 2022-07-18 NOTE — Telephone Encounter (Signed)
Patient's wife is returning phone call about patients results.

## 2022-07-18 NOTE — Telephone Encounter (Signed)
Spoke with pt'a wife and advised of results as below.  Pt's wife verbalizes understanding and thanked Therapist, sports for the call.  Charlie Pitter, PA-C 07/18/2022  7:49 AM EST     FYI: pt has memory issues. Please let pt/wife know that nuclear stress test was normal. Good news. Keep f/u with Dr. Myles Gip with EP as planned.

## 2022-07-22 ENCOUNTER — Encounter: Payer: Self-pay | Admitting: Cardiovascular Disease

## 2022-07-22 ENCOUNTER — Ambulatory Visit: Payer: Medicare HMO | Attending: Cardiovascular Disease | Admitting: Cardiovascular Disease

## 2022-07-22 VITALS — BP 140/62 | HR 52 | Ht 71.0 in | Wt 180.0 lb

## 2022-07-22 DIAGNOSIS — I4729 Other ventricular tachycardia: Secondary | ICD-10-CM

## 2022-07-22 MED ORDER — PROPRANOLOL HCL ER 60 MG PO CP24: 60.0000 mg | ORAL_CAPSULE | Freq: Every day | ORAL | 1 refills | Status: DC

## 2022-07-22 NOTE — Progress Notes (Signed)
Electrophysiology Office Note:    Date:  07/22/2022   ID:  Patrick Macias, DOB [birth year], MRN [id number]  PCP:  Crist Infante, [place] Cardiologist:  Sinclair Grooms, MD     Referring MD: Emmaline Life, NP   History of Present Illness:    Patrick Macias is a 82 y.o. male with a hx listed below, significant for vascular disease including elevated coronary calcium score, MGUS, macrocytic anemia referred for arrhythmia management.  He has a history of frequent PVCs.  During a clinic visit in October 2023, his heart rate registered 30-40 bpm due to frequent PVCs resulting in electro-apical dissociation.  He does have occasional weakness and lightheadedness.  No chest pain, palpitations, syncope, near syncope.   Past Medical History:  Diagnosis Date   Anxiety    Aortic atherosclerosis (HCC)    BPH (benign prostatic hyperplasia)    Carotid atherosclerosis    Cerumen impaction    Coronary artery calcification    Dyslipidemia    Frequent PVCs    H/O lithotripsy    Hearing decreased, bilateral    Hx of colonic polyps    IFG (impaired fasting glucose)    Macrocytic anemia    Macrocytosis    Memory loss    MGUS (monoclonal gammopathy of unknown significance)    Nephrolithiasis    TIA (transient ischemic attack)    Vitamin D deficiency     Past Surgical History:  Procedure Laterality Date   CATARACT EXTRACTION, BILATERAL     ESOPHAGEAL MANOMETRY N/A 04/16/2022   Procedure: ESOPHAGEAL MANOMETRY (EM);  Surgeon: Arta Silence, MD;  Location: [facility];  Service: Gastroenterology;  Laterality: N/A;   PROSTATECTOMY     Partial    Current Medications: Current Meds  Medication Sig   acetaminophen (TYLENOL) 500 MG tablet Take 1,000 mg by mouth every 6 (six) hours as needed for headache (pain).   aspirin EC 81 MG tablet Take 1 tablet (81 mg total) by mouth daily.   bisacodyl (DULCOLAX) 10 MG suppository Place 1 suppository (10 mg total)  rectally daily as needed for moderate constipation.   buPROPion (WELLBUTRIN XL) 150 MG 24 hr tablet Take 150 mg by mouth every morning.   cholecalciferol (VITAMIN D3) 25 MCG (1000 UNIT) tablet Take 1,000 Units by mouth daily.   donepezil (ARICEPT) 10 MG tablet Take 1 tablet (10 mg total) by mouth at bedtime.   escitalopram (LEXAPRO) 20 MG tablet Take 20 mg by mouth every morning.   memantine (NAMENDA) 10 MG tablet Take 1 tablet (10 mg total) by mouth 2 (two) times daily.   Multiple Vitamin (MULTIVITAMIN) tablet Take 1 tablet by mouth daily.   rosuvastatin (CRESTOR) 20 MG tablet Take 20 mg by mouth daily.   [DISCONTINUED] metoprolol succinate (TOPROL XL) 25 MG 24 hr tablet Take 1 tablet (25 mg total) by mouth daily.     Allergies:   Patient has no known allergies.   Social History   Socioeconomic History   Marital status: Married    Spouse name: [name]   Number of children: 2   Years of education: college   Highest education level: Bachelor's degree (e.g., BA, AB, BS)  Occupational History   Occupation: Insurance   Tobacco Use   Smoking status: Never   Smokeless tobacco: Never  Substance and Sexual Activity   Alcohol use: Not Currently   Drug use: Never   Sexual activity: Not on file  Other Topics Concern  Not on file  Social History Narrative   Lives at home with his wife.   Left-handed.   Two cups caffeine per day.   Social Determinants of Health   Financial Resource Strain: Not on file  Food Insecurity: Not on file  Transportation Needs: Not on file  Physical Activity: Not on file  Stress: Not on file  Social Connections: Not on file     Family History: The patient's family history includes Breast cancer in his mother; Leukemia in his mother; Other in his father. There is no history of Sudden death, Hypertension, Hyperlipidemia, Diabetes, or Heart attack.  ROS:   Please see the history of present illness.    All other systems reviewed and are  negative.  EKGs/Labs/Other Studies Reviewed Today:     TTE: 2022-06-27 EF 50-55%  Monitor: 5 day zio patch 04/2022 Sinus rhythm 25.6% PVC burden, occasionally occurring in bigeminy. There were a few epsiodes of NSVT  Lexiscan stress test:    Lexiscan stress showed no EKG changes to suggest ischemia   Myoview scan showed normal perfusion   LVEF on gating calculated at 54% with normal wall motion  Visually, LVEF appears greater   Prior study available for comparison from 05/17/2019. no change  EKG:  Last EKG results: today - sinus bradycardia with PVCs   Recent Labs: 03/18/2022: Magnesium 2.4; TSH 0.795 05/05/2022: ALT 34; BUN 19; Creatinine 0.97; Hemoglobin 13.4; Platelet Count 209; Potassium 4.2; Sodium 139     Physical Exam:    VS:  BP (!) 140/62   Pulse (!) 52   Ht '5\' 11"'$  (1.803 m)   Wt 180 lb (81.6 kg)   SpO2 100%   BMI 25.10 kg/m     Wt Readings from Last 3 Encounters:  07/22/22 180 lb (81.6 kg)  05/05/22 179 lb 12.8 oz (81.6 kg)  04/30/22 178 lb (80.7 kg)     GEN:  Well nourished, well developed in no acute distress CARDIAC: RRR, no murmurs, rubs, gallops RESPIRATORY:  Normal work of breathing MUSCULOSKELETAL: no edema    ASSESSMENT & PLAN:    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 Possible symptoms of intermittent weakness and lightheadedness. QRS for PVCs is quite narrow, indicating that the focus may be related to the conduction system. Will attempt to manage medically. Will switch from metoprolol to propranolol. Will try magnesium taurate. Follow-up 8 weeks. NSVT: no intervention indicated        Medication Adjustments/Labs and Tests Ordered: Current medicines are reviewed at length with the patient today.  Concerns regarding medicines are outlined above.  Orders Placed This Encounter  Procedures   EKG 12-Lead   No orders of the defined types were placed in this encounter.    Signed, Melida Quitter, MD  07/22/2022 3:59 PM    [place]

## 2022-07-22 NOTE — Patient Instructions (Signed)
Medication Instructions:  Your physician has recommended you make the following change in your medication:   1) STOP metoprolol succinate 2) START propranolol (Inderal LA) '60mg'$  daily  *If you need a refill on your cardiac medications before your next appointment, please call your pharmacy*  Lab Work: NONE  Testing/Procedures: NONE  Follow-Up: At [facility], you and your health needs are our priority.  As part of our continuing mission to provide you with exceptional heart care, we have created designated Provider Care Teams.  These Care Teams include your primary Cardiologist (physician) and Advanced Practice Providers (APPs -  Physician Assistants and Nurse Practitioners) who all work together to provide you with the care you need, when you need it.  Your next appointment:   8 week(s)  Provider:   You may see Melida Quitter, MD or one of the following Advanced Practice Providers on your designated Care Team:   Tommye Standard, [place] [address]" [place], [place], NP

## 2022-07-30 ENCOUNTER — Encounter: Payer: Self-pay | Admitting: Neurology

## 2022-07-30 ENCOUNTER — Ambulatory Visit: Payer: Medicare HMO | Admitting: Neurology

## 2022-07-30 VITALS — BP 135/65 | HR 51 | Ht 71.0 in | Wt 179.0 lb

## 2022-07-30 DIAGNOSIS — F0390 Unspecified dementia without behavioral disturbance: Secondary | ICD-10-CM

## 2022-07-30 DIAGNOSIS — R69 Illness, unspecified: Secondary | ICD-10-CM | POA: Diagnosis not present

## 2022-07-30 DIAGNOSIS — F03918 Unspecified dementia, unspecified severity, with other behavioral disturbance: Secondary | ICD-10-CM

## 2022-07-30 DIAGNOSIS — I679 Cerebrovascular disease, unspecified: Secondary | ICD-10-CM

## 2022-07-30 MED ORDER — DONEPEZIL HCL 10 MG PO TABS: 10.0000 mg | ORAL_TABLET | Freq: Every day | ORAL | 4 refills | Status: DC

## 2022-07-30 MED ORDER — MEMANTINE HCL 10 MG PO TABS: 10.0000 mg | ORAL_TABLET | Freq: Two times a day (BID) | ORAL | 4 refills | Status: DC

## 2022-07-30 NOTE — Progress Notes (Signed)
PATIENT: Patrick Macias   Patrick Macias is a 82 year old male, accompanied by his daughter Patrick Macias, seen in request by his primary care physician Patrick Macias for evaluation of mental status change, memory loss, generalized weakness, initial evaluation was on 08/01/2020,   I reviewed and summarized the referring note.  Past medical Patrick Hyperlipidemia Prostate hypertrophy Anxiety   He has been working for over 40 years, long his own insurance business, his daughter-in-law works with him, he was noted to have gradual onset of memory loss over the past couple years, become obvious since September 2021, daughter recorded Mini-Mental Status Examination by his primary care physician was 24/30, he was noted to have slow reaction time, need long time to finish conversation, often stop in the middle of the sentence.   This has become much more obvious since his hospital admission from January 1-13, 2022, before hospital admission, he developed hives on different part of his body, he has to take Zyrtec at home, with no help, was not able to sleep well due to itching rash, on the day he was admitted to the hospital on June 30, 2020, he was found to have low-grade fever, significant confusion, difficulty getting out of the bed, slurred speech, paramedic brought him to the emergency room,   Emergency room, fever, temperature 100.5, C-reactive protein 12.6,   CT angiogram of head and neck showed no significant large vessel disease   Personally personally reviewed MRI of the brain on 07/02/2020, compared to previous MRI in December 2021, in September 2021, no acute abnormality, advanced supratentorium, periventricular, right paramedian pons T2 flair signal abnormality, most consistent with small vessel disease,   Laboratory evaluations: CMP, calcium slightly decreased 8.8,  glucose was mildly elevated 108, CBC showed hemoglobin of 11.1, ammonia 16, cyclic citrul peptide antibody were negative, hepatitis B surface antigen, HCV antibody, hepatitis a IgM, hepatitis B C IgM were all negative, pro calcitonin was normal, rheumatoid factor was elevated at 30.5, double-stranded DNA was negative, ANA was negative, CMV PCR, EBV DNA PCR, Lyme titer, varicella-zoster PCR, HIV, vitamin B1, vitamin D, RPR, ammonia, Q33, folic acid, TSH, were all normal, A1c was 5.1, ESR was normal 8, negative UDS, the only abnormality was mildly decreased B12 level 221, is on B12 supplement.   CSF study: WBC 1, RBC 2, total protein of 41, glucose of 67, HSV 1/2 PCR negative   Extensive [facility] CSF encephalopathy-autoimmune panels were negative   He also had a hospital admission on December 3-6, 2021, hospital admission for increased confusion, speech difficulty, unsteady on his feet, fidgety,   During today's interview, patient reported he is back to his baseline, wife and daughter reported that he is 75% back to his baseline, was noted to have significant memory loss, Mini-Mental Status Examination is 44 out of 30, often stopping the middle of the sentence, change subject, slow reaction time,   UPDATE February 11 2021: He is accompanied by his wife at today's visit, his daughter is on the phone, he is closing his business, over the past few months, he was noted to have increased difficulty, difficulty finishing up his symptoms, difficulty following through the conversation, sleeps a lot, he used to be very active, he is no longer motivated, during the day, he watches TV most of the time, take long naps during the day, has good appetite, he can still dress, tolerating,  bathing himself, mild unsteady gait  Update July 30, 2021 SS: here today with daughter, Patrick Macias. Doing well, taking Aricept in AM due to nightmares at night. Still on Namenda 10 mg twice daily. Still doing own ADLs, lives with wife  Patrick Macias who is patient here. Does the yard work, cleans inside. Driving only short distances. Reports 90%  back to baseline since Aricept was added, within 2 months, PCP couldn't believe the improvement! He shut his business down, but is back to talking with some clients. Most of trouble is with short term memory and word findings. Family can't believe how much better he is. Didn't do memory test today, reportedly traumatic for him. Very engaged, participatory today, laughing.   Update July 30, 2022 SS: here with his wife for joint appointment, doing some short distance driving, goes to doctor by himself. Biggest issue is how to use the phone, remote, has always been an issue. Wife manages his medications, Aricept 10 mg AM and Namenda 10 mg BID.  Has been cardiology for how HR in 40's, no mention of Aricept as cause, work up has been ok. Wife doesn't want to reduce, has been very helpful for memory. Gets frustrated when on the phone trouble with word finding. I did his MMSE, he was reluctant to perform, has been frustrating he scored 24/30, missed all 3 recall.   REVIEW OF SYSTEMS: Out of a complete 14 system review of symptoms, the patient complains only of the following symptoms, and all other reviewed systems are negative.  See HPI  ALLERGIES: No Known Allergies  HOME MEDICATIONS: Outpatient Medications Prior to Visit  Medication Sig Dispense Refill   acetaminophen (TYLENOL) 500 MG tablet Take 1,000 mg by mouth every 6 (six) hours as needed for headache (pain).     aspirin EC 81 MG tablet Take 1 tablet (81 mg total) by mouth daily. 90 tablet 3   bisacodyl (DULCOLAX) 10 MG suppository Place 1 suppository (10 mg total) rectally daily as needed for moderate constipation. 12 suppository 0   buPROPion (WELLBUTRIN XL) 150 MG 24 hr tablet Take 150 mg by mouth every morning.     cholecalciferol (VITAMIN D3) 25 MCG (1000 UNIT) tablet Take 1,000 Units by mouth daily.     escitalopram (LEXAPRO) 20 MG  tablet Take 20 mg by mouth every morning.     Multiple Vitamin (MULTIVITAMIN) tablet Take 1 tablet by mouth daily.     propranolol ER (INDERAL LA) 60 MG 24 hr capsule Take 1 capsule (60 mg total) by mouth daily. 90 capsule 1   rosuvastatin (CRESTOR) 20 MG tablet Take 20 mg by mouth daily.     donepezil (ARICEPT) 10 MG tablet Take 1 tablet (10 mg total) by mouth at bedtime. 90 tablet 4   memantine (NAMENDA) 10 MG tablet Take 1 tablet (10 mg total) by mouth 2 (two) times daily. 180 tablet 4   No facility-administered medications prior to visit.    PAST MEDICAL Patrick: Past Medical Patrick:  Diagnosis Date   Anxiety    Aortic atherosclerosis (HCC)    BPH (benign prostatic hyperplasia)    Carotid atherosclerosis    Cerumen impaction    Coronary artery calcification    Dyslipidemia    Frequent PVCs    H/O lithotripsy    Hearing decreased, bilateral    Hx of colonic polyps    IFG (impaired fasting glucose)    Macrocytic anemia    Macrocytosis    Memory loss    MGUS (monoclonal  gammopathy of unknown significance)    Nephrolithiasis    TIA (transient ischemic attack)    Vitamin D deficiency     PAST SURGICAL Patrick: Past Surgical Patrick:  Procedure Laterality Date   CATARACT EXTRACTION, BILATERAL     ESOPHAGEAL MANOMETRY N/A 04/16/2022   Procedure: ESOPHAGEAL MANOMETRY (EM);  Surgeon: Arta Silence, MD;  Location: [facility];  Service: Gastroenterology;  Laterality: N/A;   PROSTATECTOMY     Partial    FAMILY Patrick: Family Patrick  Problem Relation Age of Onset   Leukemia Mother    Breast cancer Mother    Other Father        unknown medical Patrick   Sudden death Neg Hx    Hypertension Neg Hx    Hyperlipidemia Neg Hx    Diabetes Neg Hx    Heart attack Neg Hx     SOCIAL Patrick: Social Patrick   Socioeconomic Patrick   Marital status: Married    Spouse name: [name]   Number of children: 2   Years of education: college   Highest education level:  Bachelor's degree (e.g., BA, AB, BS)  Occupational Patrick   Occupation: Insurance   Tobacco Use   Smoking status: Never   Smokeless tobacco: Never  Substance and Sexual Activity   Alcohol use: Not Currently   Drug use: Never   Sexual activity: Not on file  Other Topics Concern   Not on file  Social Patrick Narrative   Lives at home with his wife.   Left-handed.   Two cups caffeine per day.   Social Determinants of Health   Financial Resource Strain: Not on file  Food Insecurity: Not on file  Transportation Needs: Not on file  Physical Activity: Not on file  Stress: Not on file  Social Connections: Not on file  Intimate Partner Violence: Not on file   PHYSICAL EXAM  Vitals:   07/30/22 0940 07/30/22 1022  BP: 135/65   Pulse: (!) 45 (!) 51  Weight: 179 lb (81.2 kg)   Height: '5\' 11"'$  (1.803 m)    Body mass index is 24.97 kg/m.  Generalized: Well developed, in no acute distress     02/11/2021   10:27 AM  Montreal Cognitive Assessment   Visuospatial/ Executive (0/5) 0  Naming (0/3) 3  Attention: Read list of digits (0/2) 0  Attention: Read list of letters (0/1) 0  Attention: Serial 7 subtraction starting at 100 (0/3) 0  Language: Repeat phrase (0/2) 0  Language : Fluency (0/1) 0  Abstraction (0/2) 1  Delayed Recall (0/5) 0  Orientation (0/6) 4  Total 8      07/30/2022   10:00 AM 08/01/2020    9:10 AM  MMSE - Mini Mental State Exam  Orientation to time 4 5  Orientation to Place 5 5  Registration 3 3  Attention/ Calculation 4 0  Recall 0 2  Language- name 2 objects 2 2  Language- repeat 1 1  Language- follow 3 step command 3 3  Language- read & follow direction 1 1  Write a sentence 1 1  Copy design 0 0  Total score 24 23   Neurological examination  Mentation: Alert oriented to time, place, Patrick taking. Follows all commands, mild word finding difficulty gets frustrated Cranial nerve II-XII: Pupils were equal round reactive to light. Extraocular  movements were full, visual field were full on confrontational test. Facial sensation and strength were normal. Head turning and shoulder shrug  were normal and symmetric. Motor:  The motor testing reveals 5 over 5 strength of all 4 extremities. Good symmetric motor tone is noted throughout.  Sensory: Sensory testing is intact to soft touch on all 4 extremities. No evidence of extinction is noted.  Coordination: Cerebellar testing reveals good finger-nose-finger and heel-to-shin bilaterally.  Gait and station: Gait is slightly wide based Reflexes: Deep tendon reflexes are symmetric and normal bilaterally.   DIAGNOSTIC DATA (LABS, IMAGING, TESTING) - I reviewed patient records, labs, notes, testing and imaging myself where available.  Lab Results  Component Value Date   WBC 4.5 05/05/2022   HGB 13.4 05/05/2022   HCT 37.8 (L) 05/05/2022   MCV 101.9 (H) 05/05/2022   PLT 209 05/05/2022      Component Value Date/Time   NA 139 05/05/2022 1030   NA 140 03/18/2022 1114   K 4.2 05/05/2022 1030   CL 108 05/05/2022 1030   CO2 24 05/05/2022 1030   GLUCOSE 108 (H) 05/05/2022 1030   BUN 19 05/05/2022 1030   BUN 25 03/18/2022 1114   CREATININE 0.97 05/05/2022 1030   CALCIUM 9.3 05/05/2022 1030   PROT 7.0 05/05/2022 1030   ALBUMIN 4.2 05/05/2022 1030   AST 37 05/05/2022 1030   ALT 34 05/05/2022 1030   ALKPHOS 38 05/05/2022 1030   BILITOT 1.1 05/05/2022 1030   GFRNONAA >60 05/05/2022 1030   GFRAA  03/21/2008 0430    >60        The eGFR has been calculated using the MDRD equation. This calculation has not been validated in all clinical   Lab Results  Component Value Date   CHOL 86 06/02/2020   HDL 36 (L) 06/02/2020   LDLCALC 32 06/02/2020   TRIG 91 06/02/2020   CHOLHDL 2.4 06/02/2020   Lab Results  Component Value Date   HGBA1C 5.1 06/02/2020   Lab Results  Component Value Date   VITAMINB12 377 04/02/2021   Lab Results  Component Value Date   TSH 0.795 03/18/2022     ASSESSMENT AND PLAN 82 y.o. year old male  has a past medical Patrick of Anxiety, Aortic atherosclerosis ([place]), BPH (benign prostatic hyperplasia), Carotid atherosclerosis, Cerumen impaction, Coronary artery calcification, Dyslipidemia, Frequent PVCs, H/O lithotripsy, Hearing decreased, bilateral, colonic polyps, IFG (impaired fasting glucose), Macrocytic anemia, Macrocytosis, Memory loss, MGUS (monoclonal gammopathy of unknown significance), Nephrolithiasis, TIA (transient ischemic attack), and Vitamin D deficiency. 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

## 2022-07-30 NOTE — Patient Instructions (Addendum)
We may consider decreasing the dose of Aricept to 5 mg to see if any impact on heart rate  I will refer you to speech therapy for cognitive training  See you back in 6 months

## 2022-07-31 ENCOUNTER — Encounter: Payer: Self-pay | Admitting: Speech Pathology

## 2022-07-31 ENCOUNTER — Ambulatory Visit: Payer: Medicare HMO | Attending: Neurology | Admitting: Speech Pathology

## 2022-07-31 DIAGNOSIS — F03918 Unspecified dementia, unspecified severity, with other behavioral disturbance: Secondary | ICD-10-CM | POA: Insufficient documentation

## 2022-07-31 DIAGNOSIS — R41841 Cognitive communication deficit: Secondary | ICD-10-CM | POA: Diagnosis not present

## 2022-07-31 DIAGNOSIS — R69 Illness, unspecified: Secondary | ICD-10-CM | POA: Diagnosis not present

## 2022-07-31 NOTE — Therapy (Signed)
OUTPATIENT SPEECH LANGUAGE PATHOLOGY EVALUATION   Patient Name: Patrick Macias MRN: [id number] DOB:[birth year], 82 y.o., male Today's Date: 07/31/2022  PCP: Crist Infante, MD REFERRING PROVIDER: Suzzanne Cloud NP  END OF SESSION:   Past Medical History:  Diagnosis Date   Anxiety    Aortic atherosclerosis ([place])    BPH (benign prostatic hyperplasia)    Carotid atherosclerosis    Cerumen impaction    Coronary artery calcification    Dyslipidemia    Frequent PVCs    H/O lithotripsy    Hearing decreased, bilateral    Hx of colonic polyps    IFG (impaired fasting glucose)    Macrocytic anemia    Macrocytosis    Memory loss    MGUS (monoclonal gammopathy of unknown significance)    Nephrolithiasis    TIA (transient ischemic attack)    Vitamin D deficiency    Past Surgical History:  Procedure Laterality Date   CATARACT EXTRACTION, BILATERAL     ESOPHAGEAL MANOMETRY N/A 04/16/2022   Procedure: ESOPHAGEAL MANOMETRY (EM);  Surgeon: Arta Silence, MD;  Location: [facility];  Service: Gastroenterology;  Laterality: N/A;   PROSTATECTOMY     Partial   Patient Active Problem List   Diagnosis Date Noted   Macrocytic anemia 04/02/2021   MGUS (monoclonal gammopathy of unknown significance) 12/28/2020   Cerebral vascular disease 08/01/2020   SIRS (systemic inflammatory response syndrome) (HCC) 37/62/8315   Metabolic encephalopathy 17/61/6073   Cognitive impairment 06/04/2020   TIA (transient ischemic attack) 06/01/2020   Dementia without behavioral disturbance ([place]) 06/01/2020   Anxiety 06/01/2020   HTN (hypertension) 06/01/2020   Right shoulder pain 08/10/2013    ONSET DATE: 07/30/22   REFERRING DIAG: X10.626 (ICD-10-CM) - Dementia with behavioral disturbance (HCC)   THERAPY DIAG:  Cognitive communication deficit  Rationale for Evaluation and Treatment: Rehabilitation  SUBJECTIVE:   SUBJECTIVE STATEMENT: "I just can't remember anything" Pt accompanied by: self and  significant other  PERTINENT HISTORY: Dementia, anxiety  PAIN:  Are you having pain? No  FALLS: Has patient fallen in last 6 months?  No  LIVING ENVIRONMENT: Lives with: lives with their family Lives in: House/apartment  PLOF:  Level of assistance: Independent with ADLs, Independent with IADLs;  Employment: Retired  PATIENT GOALS: memory  OBJECTIVE:   DIAGNOSTIC FINDINGS:  Last MRI (2022) FINDINGS: The study is significantly degraded by motion.   Brain: Increased signal in the diffusion-weighted sequence in the left parietal lobe likely represents artifact related to motion as it is not reproduced on coronal images. No acute infarction, hemorrhage, hydrocephalus, extra-axial collection or mass lesion. Scattered and confluent foci of T2 hyperintensity are seen within the white matter of the cerebral hemispheres, nonspecific, most likely related to chronic small vessel ischemia.   Vascular: Normal flow voids.   Skull and upper cervical spine: Normal marrow signal.   Sinuses/Orbits: Negative.   IMPRESSION: 1. Motion degraded study. 2. No acute intracranial abnormality. 3. Advanced chronic small vessel ischemia.     Electronically Signed   By: Pedro Earls M.D.   On: 07/02/2020 09:56  COGNITION: Overall cognitive status: Impaired Areas of impairment:  Attention: Impaired: Sustained, Selective, Alternating, Divided Memory: Impaired: Working Agricultural engineer Awareness: Impaired: Emergent Executive function: Impaired: Problem solving, Organization, Planning, Error awareness, Self-correction, and Slow processing Functional deficits: Difficulty in conversations  COGNITIVE COMMUNICATION: Following directions: Follows multi-step commands inconsistently  Auditory comprehension: Impaired: requires repetition of information Verbal expression: Impaired: loses train of thought while speaking  Functional  communication: Impaired: See  above   STANDARDIZED ASSESSMENTS: CLQT to complete next session  PATIENT REPORTED OUTCOME MEASURES (PROM): NA   TODAY'S TREATMENT:                                                                                                                                         DATE:    PATIENT EDUCATION: Education details: cog-comm Person educated: Patient and Spouse Education method: Explanation and Handouts Education comprehension: verbalized understanding and needs further education   GOALS: Goals reviewed with patient? Yes  SHORT TERM GOALS: Target date: 08/29/22  Patient will increase auditory comprehension by recalling and verbalizing strategies to assist with active listening during conversation with min assist.  Baseline: Goal status: INITIAL  2.  Patient will comprehend functional memory or visual aids for recall of important information during structured conversations with min assist.  Baseline:  Goal status: INITIAL  3.  Pt will complete medication management task with spv.  Baseline:  Goal status: INITIAL    LONG TERM GOALS: Target date: 09/29/22  Patient will increase auditory comprehension by recalling and verbalizing strategies to assist with active listening during unstructured conversations independently.  Baseline:  Goal status: INITIAL  2.   Patient will demonstrate understanding and verbalize use of functional memory\visual aids to recall important information and unstructured conversations independently  Baseline:  Goal status: INITIAL   ASSESSMENT:  CLINICAL IMPRESSION: Pt is a 82 yo male who presents to [facility] for evaluation for cognition 2/2 to dementia diagnosis. Pt endorses re: going blank in conversations and difficulty with short-term memory.  Wife reports she is responsible for bills and medications.   Pt was assessed using CLQT.  Unable to complete the session due to time constraints and will complete next session. SLP observed pt requiring  repetition of directions and not recalling what had been said by SLP in conversation. Pt was good-humored throughout; however, SLP suspects that he masks several impairments by using humor in situations where he is having trouble. SLP rec skilled ST services to address cognitive communication impairment to maximize functional communication and independence.     OBJECTIVE IMPAIRMENTS: include attention, memory, awareness, and executive functioning. These impairments are limiting patient from ADLs/IADLs and effectively communicating at home and in community. Factors affecting potential to achieve goals and functional outcome are ability to learn/carryover information.. Patient will benefit from skilled SLP services to address above impairments and improve overall function.  REHAB POTENTIAL: Good  PLAN:  SLP FREQUENCY: 2x/week  SLP DURATION: 8 weeks  PLANNED INTERVENTIONS: Environmental controls, Cueing hierachy, Cognitive reorganization, Internal/external aids, Functional tasks, SLP instruction and feedback, and Compensatory strategies    Verdene Lennert, CCC-SLP 07/31/2022, 12:44 PM

## 2022-08-05 ENCOUNTER — Ambulatory Visit: Payer: Medicare HMO | Admitting: Speech Pathology

## 2022-08-05 ENCOUNTER — Encounter: Payer: Self-pay | Admitting: Speech Pathology

## 2022-08-05 DIAGNOSIS — R41841 Cognitive communication deficit: Secondary | ICD-10-CM

## 2022-08-05 DIAGNOSIS — R69 Illness, unspecified: Secondary | ICD-10-CM | POA: Diagnosis not present

## 2022-08-05 NOTE — Therapy (Signed)
OUTPATIENT SPEECH LANGUAGE PATHOLOGY TREATMENT NOTE   Patient Name: Patrick Macias MRN: [id number] DOB:[birth year], 82 y.o., male Today's Date: 08/05/2022  PCP: Crist Infante, MD REFERRING PROVIDER: Suzzanne Cloud NP  END OF SESSION:   End of Session - 08/05/22 1220     Visit Number 2    Number of Visits 17    Date for SLP Re-Evaluation 09/30/22    SLP Start Time 0845    SLP Stop Time  23    SLP Time Calculation (min) 50 min    Activity Tolerance Patient tolerated treatment well             Past Medical History:  Diagnosis Date   Anxiety    Aortic atherosclerosis ([place])    BPH (benign prostatic hyperplasia)    Carotid atherosclerosis    Cerumen impaction    Coronary artery calcification    Dyslipidemia    Frequent PVCs    H/O lithotripsy    Hearing decreased, bilateral    Hx of colonic polyps    IFG (impaired fasting glucose)    Macrocytic anemia    Macrocytosis    Memory loss    MGUS (monoclonal gammopathy of unknown significance)    Nephrolithiasis    TIA (transient ischemic attack)    Vitamin D deficiency    Past Surgical History:  Procedure Laterality Date   CATARACT EXTRACTION, BILATERAL     ESOPHAGEAL MANOMETRY N/A 04/16/2022   Procedure: ESOPHAGEAL MANOMETRY (EM);  Surgeon: Arta Silence, MD;  Location: [facility];  Service: Gastroenterology;  Laterality: N/A;   PROSTATECTOMY     Partial   Patient Active Problem List   Diagnosis Date Noted   Macrocytic anemia 04/02/2021   MGUS (monoclonal gammopathy of unknown significance) 12/28/2020   Cerebral vascular disease 08/01/2020   SIRS (systemic inflammatory response syndrome) (HCC) 67/67/2094   Metabolic encephalopathy 70/96/2836   Cognitive impairment 06/04/2020   TIA (transient ischemic attack) 06/01/2020   Dementia without behavioral disturbance ([place]) 06/01/2020   Anxiety 06/01/2020   HTN (hypertension) 06/01/2020   Right shoulder pain 08/10/2013    ONSET DATE: 07/30/22  REFERRING DIAG:   O29.476 (ICD-10-CM) - Dementia with behavioral disturbance (HCC)    THERAPY DIAG:  Cognitive communication deficit  Rationale for Evaluation and Treatment: Rehabilitation  SUBJECTIVE:   SUBJECTIVE STATEMENT: "I drove here."  PAIN:  Are you having pain? No    OBJECTIVE:   TODAY'S TREATMENT:                                                                                                                                         DATE:   08/05/22: Pt was seen for skilled ST services this date. SLP completed edu on cognitive-communication, purpose of speech therapy, and completed the CLQT.   Pt scored the following:  Attention: Mild Memory: Mild Executive functions: Mod Language: WNL Visuospatial skills: Mod  PATIENT EDUCATION:  Education details: cog-comm Person educated: Patient and Spouse Education method: Theatre stage manager Education comprehension: verbalized understanding and needs further education     GOALS: Goals reviewed with patient? Yes   SHORT TERM GOALS: Target date: 08/29/22   Patient will increase auditory comprehension by recalling and verbalizing strategies to assist with active listening during conversation with min assist.  Baseline: Goal status: INITIAL   2.  Patient will comprehend functional memory or visual aids for recall of important information during structured conversations with min assist.  Baseline:  Goal status: INITIAL   3.  Pt will complete medication management task with spv.  Baseline:  Goal status: INITIAL       LONG TERM GOALS: Target date: 09/29/22   Patient will increase auditory comprehension by recalling and verbalizing strategies to assist with active listening during unstructured conversations independently.  Baseline:  Goal status: INITIAL   2.   Patient will demonstrate understanding and verbalize use of functional memory\visual aids to recall important information and unstructured conversations independently   Baseline:  Goal status: INITIAL     ASSESSMENT:   CLINICAL IMPRESSION: Pt is a 82 yo male who presents to [place] for evaluation for cognition 2/2 to dementia diagnosis. Pt endorses re: going blank in conversations and difficulty with short-term memory.  Wife reports she is responsible for bills and medications.   Pt was assessed using CLQT.  Unable to complete the session due to time constraints and will complete next session. SLP observed pt requiring repetition of directions and not recalling what had been said by SLP in conversation. Pt was good-humored throughout; however, SLP suspects that he masks several impairments by using humor in situations where he is having trouble. SLP rec skilled ST services to address cognitive communication impairment to maximize functional communication and independence.      OBJECTIVE IMPAIRMENTS: include attention, memory, awareness, and executive functioning. These impairments are limiting patient from ADLs/IADLs and effectively communicating at home and in community. Factors affecting potential to achieve goals and functional outcome are ability to learn/carryover information.. Patient will benefit from skilled SLP services to address above impairments and improve overall function.   REHAB POTENTIAL: Good   PLAN:   SLP FREQUENCY: 2x/week   SLP DURATION: 8 weeks   PLANNED INTERVENTIONS: Environmental controls, Cueing hierachy, Cognitive reorganization, Internal/external aids, Functional tasks, SLP instruction and feedback, and Compensatory strategies       Verdene Lennert, CCC-SLP 08/05/2022, 12:21 PM

## 2022-08-08 ENCOUNTER — Encounter: Payer: Self-pay | Admitting: Speech Pathology

## 2022-08-08 ENCOUNTER — Ambulatory Visit: Payer: Medicare HMO | Admitting: Speech Pathology

## 2022-08-08 DIAGNOSIS — R41841 Cognitive communication deficit: Secondary | ICD-10-CM | POA: Diagnosis not present

## 2022-08-08 DIAGNOSIS — R69 Illness, unspecified: Secondary | ICD-10-CM | POA: Diagnosis not present

## 2022-08-08 NOTE — Therapy (Signed)
OUTPATIENT SPEECH LANGUAGE PATHOLOGY TREATMENT NOTE   Patient Name: Patrick Macias MRN: [id number] DOB:1941-02-19, 82 y.o., male Today's Date: 08/05/2022  PCP: Crist Infante, MD REFERRING PROVIDER: Suzzanne Cloud NP  END OF SESSION:   End of Session - 08/05/22 1220     Visit Number 2    Number of Visits 17    Date for SLP Re-Evaluation 09/30/22    SLP Start Time 0845    SLP Stop Time  41    SLP Time Calculation (min) 50 min    Activity Tolerance Patient tolerated treatment well             Past Medical History:  Diagnosis Date   Anxiety    Aortic atherosclerosis ([place])    BPH (benign prostatic hyperplasia)    Carotid atherosclerosis    Cerumen impaction    Coronary artery calcification    Dyslipidemia    Frequent PVCs    H/O lithotripsy    Hearing decreased, bilateral    Hx of colonic polyps    IFG (impaired fasting glucose)    Macrocytic anemia    Macrocytosis    Memory loss    MGUS (monoclonal gammopathy of unknown significance)    Nephrolithiasis    TIA (transient ischemic attack)    Vitamin D deficiency    Past Surgical History:  Procedure Laterality Date   CATARACT EXTRACTION, BILATERAL     ESOPHAGEAL MANOMETRY N/A 04/16/2022   Procedure: ESOPHAGEAL MANOMETRY (EM);  Surgeon: Arta Silence, MD;  Location: [facility];  Service: Gastroenterology;  Laterality: N/A;   PROSTATECTOMY     Partial   Patient Active Problem List   Diagnosis Date Noted   Macrocytic anemia 04/02/2021   MGUS (monoclonal gammopathy of unknown significance) 12/28/2020   Cerebral vascular disease 08/01/2020   SIRS (systemic inflammatory response syndrome) (HCC) 0000000   Metabolic encephalopathy 0000000   Cognitive impairment 06/04/2020   TIA (transient ischemic attack) 06/01/2020   Dementia without behavioral disturbance ([place]) 06/01/2020   Anxiety 06/01/2020   HTN (hypertension) 06/01/2020   Right shoulder pain 08/10/2013    ONSET DATE: 07/30/22  REFERRING DIAG:   [id number] (ICD-10-CM) - Dementia with behavioral disturbance (HCC)    THERAPY DIAG:  Cognitive communication deficit  Rationale for Evaluation and Treatment: Rehabilitation  SUBJECTIVE:   SUBJECTIVE STATEMENT: "I remembered you told me to bring [name]."  PAIN:  Are you having pain? No    OBJECTIVE:   TODAY'S TREATMENT:                                                                                                                                         DATE:   08/08/22: Patient was seen for skilled ST services this date.  Patient was accompanied by wife this session.  SLP reviewed scores from assessment with wife.  Wife reports she feels patient functions higher than what the score suggests.  SLP explained that assessments are not functional in nature; therefore, they are able to show [place] how we are functioning outside of the treatment room.  Wife reports patient cannot perform the following tasks effectively or efficiently re: navigating phone, remote, microwave, stove.  SLP suggested wife take pictures of items so SLP is able to create visual cues.  Wife also reports patient has difficulty with organization of prior work tasks (paperwork).  SLP to assist in developing a system to assist.  Began to occasion on communication strategies, as patient has difficulty comprehending verbal information.   08/05/22: Pt was seen for skilled ST services this date. SLP completed edu on cognitive-communication, purpose of speech therapy, and completed the CLQT.   Pt scored the following:  Attention: Mild Memory: Mild Executive functions: Mod Language: WNL Visuospatial skills: Mod  PATIENT EDUCATION: Education details: cog-comm Person educated: Patient and Spouse Education method: Theatre stage manager Education comprehension: verbalized understanding and needs further education     GOALS: Goals reviewed with patient? Yes   SHORT TERM GOALS: Target date: 08/29/22   Patient will increase  auditory comprehension by recalling and verbalizing strategies to assist with active listening during conversation with min assist.  Baseline: Goal status: INITIAL   2.  Patient will comprehend functional memory or visual aids for recall of important information during structured conversations with min assist.  Baseline:  Goal status: INITIAL   3.  Pt will complete medication management task with spv.  Baseline:  Goal status: INITIAL       LONG TERM GOALS: Target date: 09/29/22   Patient will increase auditory comprehension by recalling and verbalizing strategies to assist with active listening during unstructured conversations independently.  Baseline:  Goal status: INITIAL   2.   Patient will demonstrate understanding and verbalize use of functional memory\visual aids to recall important information and unstructured conversations independently  Baseline:  Goal status: INITIAL     ASSESSMENT:   CLINICAL IMPRESSION: Pt is a 82 yo male who presents to [place] for evaluation for cognition 2/2 to dementia diagnosis. see tx note. SLP rec skilled ST services to address cognitive communication impairment to maximize functional communication and independence.      OBJECTIVE IMPAIRMENTS: include attention, memory, awareness, and executive functioning. These impairments are limiting patient from ADLs/IADLs and effectively communicating at home and in community. Factors affecting potential to achieve goals and functional outcome are ability to learn/carryover information.. Patient will benefit from skilled SLP services to address above impairments and improve overall function.   REHAB POTENTIAL: Good   PLAN:   SLP FREQUENCY: 2x/week   SLP DURATION: 8 weeks   PLANNED INTERVENTIONS: Environmental controls, Cueing hierachy, Cognitive reorganization, Internal/external aids, Functional tasks, SLP instruction and feedback, and Compensatory strategies       Verdene Lennert,  CCC-SLP 08/05/2022, 12:21 PM

## 2022-08-11 DIAGNOSIS — N200 Calculus of kidney: Secondary | ICD-10-CM | POA: Diagnosis not present

## 2022-08-12 ENCOUNTER — Encounter (HOSPITAL_COMMUNITY): Payer: Self-pay | Admitting: Gastroenterology

## 2022-08-12 ENCOUNTER — Ambulatory Visit: Payer: Medicare HMO | Admitting: Speech Pathology

## 2022-08-12 NOTE — Progress Notes (Signed)
Patrick Macias  Prep instructions- no prep, NPO after midnight  PCP- Crist Infante, MD Cards- Dr. Daneen Schick  EKG- 07/22/22 Echo- 04/01/22 Cath- n/a Stress- 07/17/22 ICD/PM- n/a Blood thinner- n/a GLP-1- n/a  Anesthesia Review: Hx of CAD, TIA, PVC's, MGUS, Memory Loss. Last saw cards on 07/22/22 has been having some dizziness and low heart rates in the 40-50s. I spoke with wife and she said they changed some heart meds but is still having low HR's, has a f/u appt on 2/19.

## 2022-08-13 ENCOUNTER — Telehealth: Payer: Self-pay

## 2022-08-13 NOTE — Telephone Encounter (Signed)
..     Pre-operative Risk Assessment    Patient Name: Patrick Macias  DOB: 11-13-1940 MRN: [id number]      Request for Surgical Clearance    Procedure:   colonoscopy  Date of Surgery:  Clearance 08/20/22                                 Surgeon:  dr Gwyndolyn Saxon [place] Surgeon's Group or Practice Name:  [facility] Phone number:  [phone] Fax number:  [phone]   Type of Clearance Requested:   - Medical  - Pharmacy:  Hold Aspirin     Type of Anesthesia:   propofol   Additional requests/questions:    Gwenlyn Found   08/13/2022, 1:22 PM

## 2022-08-13 NOTE — Telephone Encounter (Signed)
Patient is scheduled for a follow-up visit on 08/18/2022 with Melina Copa, PA.  Preoperative clearance added to upcoming appointment note.  Ambrose Pancoast, NP

## 2022-08-15 ENCOUNTER — Encounter: Payer: Self-pay | Admitting: Speech Pathology

## 2022-08-15 ENCOUNTER — Ambulatory Visit: Payer: Medicare HMO | Admitting: Speech Pathology

## 2022-08-15 DIAGNOSIS — R41841 Cognitive communication deficit: Secondary | ICD-10-CM | POA: Diagnosis not present

## 2022-08-15 DIAGNOSIS — R69 Illness, unspecified: Secondary | ICD-10-CM | POA: Diagnosis not present

## 2022-08-15 NOTE — Therapy (Signed)
OUTPATIENT SPEECH LANGUAGE PATHOLOGY TREATMENT NOTE   Patient Name: Patrick Macias MRN: [id number] DOB:[birth year], 82 y.o., male Today's Date: 08/15/2022  PCP: Crist Infante, MD REFERRING PROVIDER: Suzzanne Cloud NP  END OF SESSION:   End of Session - 08/15/22 0935     Visit Number 4    Number of Visits 17    Date for SLP Re-Evaluation 09/30/22    SLP Start Time 0930    SLP Stop Time  1010    SLP Time Calculation (min) 40 min    Activity Tolerance Patient tolerated treatment well             Past Medical History:  Diagnosis Date   Anxiety    Aortic atherosclerosis ([place])    BPH (benign prostatic hyperplasia)    Carotid atherosclerosis    Cerumen impaction    Coronary artery calcification    Dyslipidemia    Frequent PVCs    H/O lithotripsy    Hearing decreased, bilateral    Hx of colonic polyps    IFG (impaired fasting glucose)    Macrocytic anemia    Macrocytosis    Memory loss    MGUS (monoclonal gammopathy of unknown significance)    Nephrolithiasis    TIA (transient ischemic attack)    Vitamin D deficiency    Past Surgical History:  Procedure Laterality Date   CATARACT EXTRACTION, BILATERAL     ESOPHAGEAL MANOMETRY N/A 04/16/2022   Procedure: ESOPHAGEAL MANOMETRY (EM);  Surgeon: Arta Silence, MD;  [facility]: [facility];  Service: Gastroenterology;  Laterality: N/A;   PROSTATECTOMY     Partial   Patient Active Problem List   Diagnosis Date Noted   Macrocytic anemia 04/02/2021   MGUS (monoclonal gammopathy of unknown significance) 12/28/2020   Cerebral vascular disease 08/01/2020   SIRS (systemic inflammatory response syndrome) (HCC) 0000000   Metabolic encephalopathy 0000000   Cognitive impairment 06/04/2020   TIA (transient ischemic attack) 06/01/2020   Dementia without behavioral disturbance ([place]) 06/01/2020   Anxiety 06/01/2020   HTN (hypertension) 06/01/2020   Right shoulder pain 08/10/2013    ONSET DATE: 07/30/22  REFERRING  DIAG:  [id number] (ICD-10-CM) - Dementia with behavioral disturbance (HCC)    THERAPY DIAG:  Cognitive communication deficit  Rationale for Evaluation and Treatment: Rehabilitation  SUBJECTIVE:   SUBJECTIVE STATEMENT: "I can't recall."  PAIN:  Are you having pain? No    OBJECTIVE:   TODAY'S TREATMENT:                                                                                                                                         DATE:   08/15/22: Patient was seen for skilled ST services this date. Patient was accompanied by wife this session. Reviewed success with communication strategies at home. SLP clarified all questions wife had regarding information. Pt appeared "lost" during today's session. SLP encouraged pt to repeat back words  heard to increase processing and comprehension of verbal information. SLP suspects this was helpful, as several times prior to this, pt was unable to recall any information. Wife and pt identified strategies they would like to work on more at home re: providing more processing time, not filling in pt's ideas/sentences, having pt repeat back instructions. At times, pt nods and pretends he understands information, or looks to wife to fill in gaps. SLP provided visual for pt to refer to when he could not remember information ( I can't recall) and encouraged honesty vs. "Fake it til you make it". Will continue to address communication strategies next session.   08/08/22: Patient was seen for skilled ST services this date.  Patient was accompanied by wife this session.  SLP reviewed scores from assessment with wife.  Wife reports she feels patient functions higher than what the score suggests.  SLP explained that assessments are not functional in nature; therefore, they are able to show [place] how we are functioning outside of the treatment room.  Wife reports patient cannot perform the following tasks effectively or efficiently re: navigating phone, remote, microwave,  stove.  SLP suggested wife take pictures of items so SLP is able to create visual cues.  Wife also reports patient has difficulty with organization of prior work tasks (paperwork).  SLP to assist in developing a system to assist.  Began to occasion on communication strategies, as patient has difficulty comprehending verbal information.   08/05/22: Pt was seen for skilled ST services this date. SLP completed edu on cognitive-communication, purpose of speech therapy, and completed the CLQT.   Pt scored the following:  Attention: Mild Memory: Mild Executive functions: Mod Language: WNL Visuospatial skills: Mod  PATIENT EDUCATION: Education details: cog-comm Person educated: Patient and Spouse Education method: Theatre stage manager Education comprehension: verbalized understanding and needs further education     GOALS: Goals reviewed with patient? Yes   SHORT TERM GOALS: Target date: 08/29/22   Patient will increase auditory comprehension by recalling and verbalizing strategies to assist with active listening during conversation with min assist.  Baseline: Goal status: INITIAL   2.  Patient will comprehend functional memory or visual aids for recall of important information during structured conversations with min assist.  Baseline:  Goal status: INITIAL   3.  Pt will complete medication management task with spv.  Baseline:  Goal status: INITIAL       LONG TERM GOALS: Target date: 09/29/22   Patient will increase auditory comprehension by recalling and verbalizing strategies to assist with active listening during unstructured conversations independently.  Baseline:  Goal status: INITIAL   2.   Patient will demonstrate understanding and verbalize use of functional memory\visual aids to recall important information and unstructured conversations independently  Baseline:  Goal status: INITIAL     ASSESSMENT:   CLINICAL IMPRESSION: Pt is a 82 yo male who presents to [place] for  evaluation for cognition 2/2 to dementia diagnosis. see tx note. SLP rec skilled ST services to address cognitive communication impairment to maximize functional communication and independence.      OBJECTIVE IMPAIRMENTS: include attention, memory, awareness, and executive functioning. These impairments are limiting patient from ADLs/IADLs and effectively communicating at home and in community. Factors affecting potential to achieve goals and functional outcome are ability to learn/carryover information.. Patient will benefit from skilled SLP services to address above impairments and improve overall function.   REHAB POTENTIAL: Good   PLAN:   SLP FREQUENCY: 2x/week   SLP  DURATION: 8 weeks   PLANNED INTERVENTIONS: Environmental controls, Cueing hierachy, Cognitive reorganization, Internal/external aids, Functional tasks, SLP instruction and feedback, and Compensatory strategies       Verdene Lennert, CCC-SLP 08/15/2022, 9:35 AM

## 2022-08-17 NOTE — Progress Notes (Unsigned)
Cardiology Clinic Note   Patient Name: Patrick Macias Date of Encounter: 08/18/2022  Primary Care Provider:  Crist Infante, MD Primary Cardiologist:  Sinclair Grooms, MD (Inactive)  Patient Profile    Patrick Macias is a 82 y.o. male with a past medical history of CAD per coronary calcium scoring, NSVT/PVCs, hypertension, dyslipidemia, carotid artery disease, anxiety, MGUS, TIA, dementia who presents to the clinic today for preoperative risk assessment for endoscopy and follow-up.  Past Medical History    Past Medical History:  Diagnosis Date   Anxiety    Aortic atherosclerosis (HCC)    BPH (benign prostatic hyperplasia)    Carotid atherosclerosis    Cerumen impaction    Coronary artery calcification    Dyslipidemia    Frequent PVCs    H/O lithotripsy    Hearing decreased, bilateral    Hx of colonic polyps    IFG (impaired fasting glucose)    Macrocytic anemia    Macrocytosis    Memory loss    MGUS (monoclonal gammopathy of unknown significance)    Nephrolithiasis    TIA (transient ischemic attack)    Vitamin D deficiency    Past Surgical History:  Procedure Laterality Date   CATARACT EXTRACTION, BILATERAL     ESOPHAGEAL MANOMETRY N/A 04/16/2022   Procedure: ESOPHAGEAL MANOMETRY (EM);  Surgeon: Arta Silence, MD;  Location: [facility];  Service: Gastroenterology;  Laterality: N/A;   PROSTATECTOMY     Partial    Allergies  No Known Allergies  History of Present Illness    [name] has a past medical history of: CAD. Cardiac calcium scoring 02/04/2019: Coronary calcium score 846 (75th percentile).  Aortic atherosclerosis. Nuclear stress test 07/17/2022: Low risk study.  Unchanged from previous in 2020. NSVT/PVCs. Echo 04/01/2022: EF 50 to 55%.  Mild RVE.  Mild MR.  Mild to moderate AR.  RA pressure 8 mmHg. 5-day ZIO 05/16/2022: Predominant underlying rhythm sinus rhythm.  17 episodes of NSVT, longest 6 beats.  12 beat SVT at 105 bpm.  No A-fib.   Ventricular ectopic burden 28%. Hypertension. Dyslipidemia. Lipid panel per [facility] 05/07/2022: LDL 35, HDL 47, TG 86, total 99. Carotid artery disease. Carotid ultrasound 03/31/2022: Bilateral ICA 1 to 39%. Anxiety. MGUS. TIA. Dementia.  Mr. Seabold was first evaluated by Dr. Tamala Julian on 05/11/2019 for history of CAD and a high risk coronary calcium score at the request of Dr. Joylene Draft, PCP.  Nuclear stress testing showed no reversible ischemia and was a low risk study.  Patient was seen in follow-up by Melina Copa, PA-C on 03/18/2022.  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  Patient had complaints of fatigue after activity.  Patient wore 5-day ZIO which showed 17 episodes of NSVT and 28% ventricular ectopy burden (as outlined above).  Patient was started on metoprolol.  Echo showed low normal LV function (further details outlined above).  Nuclear stress test was a low risk study unchanged from previous in 2020.  Patient was last seen in the office by Dr. Myles Gip on 07/22/2022.  At that time he reported occasional weakness and lightheadedness suspected to be related to frequent PVCs.  Metoprolol was switched to propranolol.  Today, patient is accompanied by his wife. He is doing well. He never felt his PVCs but did complain of occasional weakness and lightheadedness. He reports that has improved since starting propranolol. HR upper 40s today,  but consistent with prior baseline. Patient denies shortness of breath or dyspnea on exertion. No chest pain, pressure, or tightness. Denies dizziness, pre-syncope, syncope, lower extremity edema, orthopnea, or PND. No palpitations.   He also needs preoperative risk assessment for an upcoming EGD for botox injection secondary to esophageal stricture. He stays active by walking and gardening. He is able to walk up a flight of stairs without difficulty.    Home Medications     Current Meds  Medication Sig   acetaminophen (TYLENOL) 500 MG tablet Take 1,000 mg by mouth every 6 (six) hours as needed for headache (pain).   aspirin EC 81 MG tablet Take 1 tablet (81 mg total) by mouth daily.   buPROPion (WELLBUTRIN XL) 150 MG 24 hr tablet Take 150 mg by mouth every morning.   cholecalciferol (VITAMIN D3) 25 MCG (1000 UNIT) tablet Take 1,000 Units by mouth daily.   donepezil (ARICEPT) 10 MG tablet Take 1 tablet (10 mg total) by mouth at bedtime.   escitalopram (LEXAPRO) 20 MG tablet Take 20 mg by mouth every morning.   memantine (NAMENDA) 10 MG tablet Take 1 tablet (10 mg total) by mouth 2 (two) times daily.   Multiple Vitamin (MULTIVITAMIN) tablet Take 1 tablet by mouth daily.   propranolol ER (INDERAL LA) 60 MG 24 hr capsule Take 1 capsule (60 mg total) by mouth daily.   rosuvastatin (CRESTOR) 20 MG tablet Take 20 mg by mouth daily.    Family History    Family History  Problem Relation Age of Onset   Leukemia Mother    Breast cancer Mother    Other Father        unknown medical history   Sudden death Neg Hx    Hypertension Neg Hx    Hyperlipidemia Neg Hx    Diabetes Neg Hx    Heart attack Neg Hx    He indicated that his mother is deceased. He indicated that his father is deceased. He indicated that the status of his neg hx is unknown.   Social History    Social History   Socioeconomic History   Marital status: Married    Spouse name: [name]   Number of children: 2   Years of education: college   Highest education level: Bachelor's degree (e.g., BA, AB, BS)  Occupational History   Occupation: Insurance   Tobacco Use   Smoking status: Never   Smokeless tobacco: Never  Substance and Sexual Activity   Alcohol use: Not Currently   Drug use: Never   Sexual activity: Not on file  Other Topics Concern   Not on file  Social History Narrative   Lives at home with his wife.   Left-handed.   Two cups caffeine per day.   Social Determinants of  Health   Financial Resource Strain: Not on file  Food Insecurity: Not on file  Transportation Needs: Not on file  Physical Activity: Not on file  Stress: Not on file  Social Connections: Not on file  Intimate Partner Violence: Not on file     Review of Systems    General:  No chills, fever, night sweats or weight changes.  Cardiovascular:  No chest pain, dyspnea on exertion, edema, orthopnea, palpitations, paroxysmal nocturnal dyspnea. Dermatological: No rash, lesions/masses Respiratory: No cough, dyspnea Urologic: No hematuria, dysuria Abdominal:   No nausea, vomiting, diarrhea, bright red blood per rectum, melena, or hematemesis Neurologic:  No visual changes, weakness, changes in mental status. All other systems reviewed and are  otherwise negative except as noted above.  Physical Exam    VS:  BP 136/76   Pulse (!) 48   Ht 5' 11"$  (1.803 m)   Wt 177 lb 6.4 oz (80.5 kg)   SpO2 97%   BMI 24.74 kg/m  , BMI Body mass index is 24.74 kg/m. GEN:  Well nourished, well developed, in no acute distress. HEENT: Normal. Neck: Supple, no JVD, carotid bruits, or masses. Cardiac: RRR, no murmurs, rubs, or gallops. No clubbing, cyanosis, edema.  Radials/DP/PT 2+ and equal bilaterally.  Respiratory:  Respirations regular and unlabored, clear to auscultation bilaterally. GI: Soft, nontender, nondistended. MS: No deformity or atrophy. Skin: Warm and dry, no rash. Neuro: Strength and sensation are intact. Psych: Normal affect.  Accessory Clinical Findings    Recent Labs: 03/18/2022: Magnesium 2.4; TSH 0.795 05/05/2022: ALT 34; BUN 19; Creatinine 0.97; Hemoglobin 13.4; Platelet Count 209; Potassium 4.2; Sodium 139   Recent Lipid Panel    Component Value Date/Time   CHOL 86 06/02/2020 0145   TRIG 91 06/02/2020 0145   HDL 36 (L) 06/02/2020 0145   CHOLHDL 2.4 06/02/2020 0145   VLDL 18 06/02/2020 0145   LDLCALC 32 06/02/2020 0145    ECG personally reviewed by me today: Sinus  bradycardia, rate 48 bpm, 1st degree AV block.  No significant changes from 07/22/2022.    Assessment & Plan   Preoperative cardiovascular risk assessment. Endoscopy. Chart reviewed as part of pre-operative protocol coverage. Patient does not have a history of ischemic heart disease, PCI, heart failure, or stroke. According to the RCRI, patient has a 0.9% risk of MACE. Patient reports activity equivalent to 4.0 METS (walking, gardening). Based on ACC/AHA guidelines, DIVINE NAGARAJAN would be at acceptable risk for the planned procedure without further cardiovascular testing. Patient was advised that if he develops new symptoms prior to surgery to contact our office to arrange a follow-up appointment.  Secondary to the patient's comorbid conditions, we prefer to continue aspirin throughout the perioperative period. Given that this procedure is considered low bleeding risk, surgeon may hold 5-7 days prior to procedure if deemed necessary for increased bleeding risk. Surgeon may want to discuss holding aspirin therapy with PCP given history of TIA. However, the patient states he took this as recently as last night. I have advised him to reach out to his GI team to let them know. I will route this recommendation to the requesting party via Epic fax function.  CAD.  Coronary calcium score August 2020 846 (75th percentile).  Nuclear stress test January 2024 low risk study unchanged from 2020.  Patient denies chest pain, tightness or pressure. He is active with walking and gardening. Continue aspirin, propranolol, rosuvastatin. PVCs/NSVT with baseline sinus bradycardia.  5-day ZIO November 2023 showed 28% PVC burden.  Patient was started on propranolol by Dr. Myles Gip on 07/22/2022.  He reports feeling less fatigued and lightheaded since starting propranolol. EKG today shows sinus bradycardia, 1st degree AV block, heart rate 48 bpm. Given improvement in symptoms, we will continue present regimen but follow for symptoms  of symptomatic bradycardia. He denies dizziness or presyncope.  Continue propranolol.  Follow-up with Dr. Myles Gip as previously scheduled. Hypertension.  BP today 136/76. Patient denies headaches or dizziness.  Continue propranolol. Hyperlipidemia.  LDL November 2023 35, at goal.  Continue rosuvastatin.   Disposition: Keep scheduled follow-up with Dr. Myles Gip 09/18/2022. Return in 6 months to general cardiology or sooner as needed. Patient suggested to establish with Dr. Gasper Sells but prefers next  OV with [name] Dunn.   Justice [place]. Tyrus Wilms, DNP, NP-C     08/18/2022, 11:10 AM [place] [address] Office [phone] Fax [phone]

## 2022-08-18 ENCOUNTER — Ambulatory Visit: Payer: Medicare HMO | Attending: Physician Assistant | Admitting: Student

## 2022-08-18 ENCOUNTER — Encounter: Payer: Self-pay | Admitting: Student

## 2022-08-18 VITALS — BP 136/76 | HR 48 | Ht 71.0 in | Wt 177.4 lb

## 2022-08-18 DIAGNOSIS — I493 Ventricular premature depolarization: Secondary | ICD-10-CM

## 2022-08-18 DIAGNOSIS — I10 Essential (primary) hypertension: Secondary | ICD-10-CM | POA: Diagnosis not present

## 2022-08-18 DIAGNOSIS — I2510 Atherosclerotic heart disease of native coronary artery without angina pectoris: Secondary | ICD-10-CM | POA: Diagnosis not present

## 2022-08-18 DIAGNOSIS — R001 Bradycardia, unspecified: Secondary | ICD-10-CM | POA: Diagnosis not present

## 2022-08-18 DIAGNOSIS — Z01810 Encounter for preprocedural cardiovascular examination: Secondary | ICD-10-CM | POA: Diagnosis not present

## 2022-08-18 DIAGNOSIS — E785 Hyperlipidemia, unspecified: Secondary | ICD-10-CM

## 2022-08-18 NOTE — Patient Instructions (Signed)
Medication Instructions:  Your physician recommends that you continue on your current medications as directed. Please refer to the Current Medication list given to you today.  *If you need a refill on your cardiac medications before your next appointment, please call your pharmacy*   Lab Work: None ordered  If you have labs (blood work) drawn today and your tests are completely normal, you will receive your results only by: [place] (if you have MyChart) OR A paper copy in the mail If you have any lab test that is abnormal or we need to change your treatment, we will call you to review the results.   Testing/Procedures: None ordered   Follow-Up: At [facility], you and your health needs are our priority.  As part of our continuing mission to provide you with exceptional heart care, we have created designated Provider Care Teams.  These Care Teams include your primary Cardiologist (physician) and Advanced Practice Providers (APPs -  Physician Assistants and Nurse Practitioners) who all work together to provide you with the care you need, when you need it.  We recommend signing up for the patient portal called "MyChart".  Sign up information is provided on this After Visit Summary.  MyChart is used to connect with patients for Virtual Visits (Telemedicine).  Patients are able to view lab/test results, encounter notes, upcoming appointments, etc.  Non-urgent messages can be sent to your provider as well.   To learn more about what you can do with MyChart, go to [email or web address].    Your next appointment:   6 month(s)  Provider:   Rudean Haskell, MD      Other Instructions

## 2022-08-19 ENCOUNTER — Encounter: Payer: Self-pay | Admitting: Speech Pathology

## 2022-08-19 ENCOUNTER — Ambulatory Visit: Payer: Medicare HMO | Admitting: Speech Pathology

## 2022-08-19 DIAGNOSIS — R69 Illness, unspecified: Secondary | ICD-10-CM | POA: Diagnosis not present

## 2022-08-19 DIAGNOSIS — R41841 Cognitive communication deficit: Secondary | ICD-10-CM | POA: Diagnosis not present

## 2022-08-19 NOTE — Therapy (Addendum)
OUTPATIENT SPEECH LANGUAGE PATHOLOGY TREATMENT NOTE   Patient Name: Patrick Macias MRN: [id number] DOB:1941/05/04, 82 y.o., male Today's Date: 08/15/2022  PCP: Crist Infante, MD REFERRING PROVIDER: Suzzanne Cloud NP  END OF SESSION:   End of Session - 08/15/22 0935     Visit Number 4    Number of Visits 17    Date for SLP Re-Evaluation 09/30/22    SLP Start Time 0930    SLP Stop Time  1010    SLP Time Calculation (min) 40 min    Activity Tolerance Patient tolerated treatment well             Past Medical History:  Diagnosis Date   Anxiety    Aortic atherosclerosis ([place])    BPH (benign prostatic hyperplasia)    Carotid atherosclerosis    Cerumen impaction    Coronary artery calcification    Dyslipidemia    Frequent PVCs    H/O lithotripsy    Hearing decreased, bilateral    Hx of colonic polyps    IFG (impaired fasting glucose)    Macrocytic anemia    Macrocytosis    Memory loss    MGUS (monoclonal gammopathy of unknown significance)    Nephrolithiasis    TIA (transient ischemic attack)    Vitamin D deficiency    Past Surgical History:  Procedure Laterality Date   CATARACT EXTRACTION, BILATERAL     ESOPHAGEAL MANOMETRY N/A 04/16/2022   Procedure: ESOPHAGEAL MANOMETRY (EM);  Surgeon: Arta Silence, MD;  Location: [facility];  Service: Gastroenterology;  Laterality: N/A;   PROSTATECTOMY     Partial   Patient Active Problem List   Diagnosis Date Noted   Macrocytic anemia 04/02/2021   MGUS (monoclonal gammopathy of unknown significance) 12/28/2020   Cerebral vascular disease 08/01/2020   SIRS (systemic inflammatory response syndrome) (HCC) 0000000   Metabolic encephalopathy 0000000   Cognitive impairment 06/04/2020   TIA (transient ischemic attack) 06/01/2020   Dementia without behavioral disturbance ([place]) 06/01/2020   Anxiety 06/01/2020   HTN (hypertension) 06/01/2020   Right shoulder pain 08/10/2013    ONSET DATE: 07/30/22  REFERRING  DIAG:  [id number] (ICD-10-CM) - Dementia with behavioral disturbance (HCC)    THERAPY DIAG:  Cognitive communication deficit  Rationale for Evaluation and Treatment: Rehabilitation  SUBJECTIVE:   SUBJECTIVE STATEMENT: "I was frustrated."  PAIN:  Are you having pain? No    OBJECTIVE:   TODAY'S TREATMENT:                                                                                                                                         DATE:   08/19/22: Patient was seen for skilled ST services with focus on communication skills. Wife provided examples of communication breakdown that occurred when driving to doctor's office. SLP provided solution of using GPS to decrease stress while driving for [name]. Continued edu on communication strategies. Pt  reported she has a hard time being direct, as she also has communication difficulties since her stroke. [name] was provided with visual to take home reading the following: "I can't recall." And "Can you repeat that?" SLP continues to encourage pt repeating back information to increase comprehension. SLP encouraged use of labels in the house as pt wife reported difficulty finding items (in the fridge). To finish up communication strategies next session.   08/15/22: Patient was seen for skilled ST services this date. Patient was accompanied by wife this session. Reviewed success with communication strategies at home. SLP clarified all questions wife had regarding information. Pt appeared "lost" during today's session. SLP encouraged pt to repeat back words heard to increase processing and comprehension of verbal information. SLP suspects this was helpful, as several times prior to this, pt was unable to recall any information. Wife and pt identified strategies they would like to work on more at home re: providing more processing time, not filling in pt's ideas/sentences, having pt repeat back instructions. At times, pt nods and pretends he understands  information, or looks to wife to fill in gaps. SLP provided visual for pt to refer to when he could not remember information ( I can't recall) and encouraged honesty vs. "Fake it til you make it". Will continue to address communication strategies next session.   08/08/22: Patient was seen for skilled ST services this date.  Patient was accompanied by wife this session.  SLP reviewed scores from assessment with wife.  Wife reports she feels patient functions higher than what the score suggests.  SLP explained that assessments are not functional in nature; therefore, they are able to show [place] how we are functioning outside of the treatment room.  Wife reports patient cannot perform the following tasks effectively or efficiently re: navigating phone, remote, microwave, stove.  SLP suggested wife take pictures of items so SLP is able to create visual cues.  Wife also reports patient has difficulty with organization of prior work tasks (paperwork).  SLP to assist in developing a system to assist.  Began to occasion on communication strategies, as patient has difficulty comprehending verbal information.   08/05/22: Pt was seen for skilled ST services this date. SLP completed edu on cognitive-communication, purpose of speech therapy, and completed the CLQT.   Pt scored the following:  Attention: Mild Memory: Mild Executive functions: Mod Language: WNL Visuospatial skills: Mod  PATIENT EDUCATION: Education details: cog-comm Person educated: Patient and Spouse Education method: Theatre stage manager Education comprehension: verbalized understanding and needs further education     GOALS: Goals reviewed with patient? Yes   SHORT TERM GOALS: Target date: 08/29/22   Patient will increase auditory comprehension by recalling and verbalizing strategies to assist with active listening during conversation with min assist.  Baseline: Goal status: INITIAL   2.  Patient will comprehend functional memory or  visual aids for recall of important information during structured conversations with min assist.  Baseline:  Goal status: INITIAL   3.  Pt will complete medication management task with spv.  Baseline:  Goal status: INITIAL       LONG TERM GOALS: Target date: 09/29/22   Patient will increase auditory comprehension by recalling and verbalizing strategies to assist with active listening during unstructured conversations independently.  Baseline:  Goal status: INITIAL   2.   Patient will demonstrate understanding and verbalize use of functional memory\visual aids to recall important information and unstructured conversations independently  Baseline:  Goal status: INITIAL  ASSESSMENT:   CLINICAL IMPRESSION: Pt is a 82 yo male who presents to [place] for evaluation for cognition 2/2 to dementia diagnosis. see tx note. SLP rec skilled ST services to address cognitive communication impairment to maximize functional communication and independence.      OBJECTIVE IMPAIRMENTS: include attention, memory, awareness, and executive functioning. These impairments are limiting patient from ADLs/IADLs and effectively communicating at home and in community. Factors affecting potential to achieve goals and functional outcome are ability to learn/carryover information.. Patient will benefit from skilled SLP services to address above impairments and improve overall function.   REHAB POTENTIAL: Good   PLAN:   SLP FREQUENCY: 2x/week   SLP DURATION: 8 weeks   PLANNED INTERVENTIONS: Environmental controls, Cueing hierachy, Cognitive reorganization, Internal/external aids, Functional tasks, SLP instruction and feedback, and Compensatory strategies       Verdene Lennert, CCC-SLP 08/15/2022, 9:35 AM

## 2022-08-20 ENCOUNTER — Encounter (HOSPITAL_COMMUNITY): Payer: Self-pay | Admitting: Gastroenterology

## 2022-08-20 ENCOUNTER — Encounter (HOSPITAL_COMMUNITY)
Admission: RE | Disposition: A | Discharge status: Home / Self Care | Payer: Self-pay | Source: Home / Self Care | Attending: Gastroenterology

## 2022-08-20 ENCOUNTER — Other Ambulatory Visit: Payer: Self-pay

## 2022-08-20 ENCOUNTER — Ambulatory Visit (HOSPITAL_COMMUNITY): Payer: Medicare HMO | Admitting: Anesthesiology

## 2022-08-20 ENCOUNTER — Ambulatory Visit (HOSPITAL_COMMUNITY)
Admission: RE | Admit: 2022-08-20 | Discharge: 2022-08-20 | Disposition: A | Discharge status: Home / Self Care | Payer: Medicare HMO | Attending: Gastroenterology | Admitting: Gastroenterology

## 2022-08-20 ENCOUNTER — Ambulatory Visit (HOSPITAL_BASED_OUTPATIENT_CLINIC_OR_DEPARTMENT_OTHER): Payer: Medicare HMO | Admitting: Anesthesiology

## 2022-08-20 DIAGNOSIS — K224 Dyskinesia of esophagus: Secondary | ICD-10-CM | POA: Diagnosis not present

## 2022-08-20 DIAGNOSIS — F419 Anxiety disorder, unspecified: Secondary | ICD-10-CM | POA: Insufficient documentation

## 2022-08-20 DIAGNOSIS — R1310 Dysphagia, unspecified: Secondary | ICD-10-CM | POA: Insufficient documentation

## 2022-08-20 DIAGNOSIS — D132 Benign neoplasm of duodenum: Secondary | ICD-10-CM | POA: Diagnosis not present

## 2022-08-20 DIAGNOSIS — K449 Diaphragmatic hernia without obstruction or gangrene: Secondary | ICD-10-CM | POA: Insufficient documentation

## 2022-08-20 DIAGNOSIS — F0390 Unspecified dementia without behavioral disturbance: Secondary | ICD-10-CM | POA: Diagnosis not present

## 2022-08-20 DIAGNOSIS — R69 Illness, unspecified: Secondary | ICD-10-CM | POA: Diagnosis not present

## 2022-08-20 DIAGNOSIS — I10 Essential (primary) hypertension: Secondary | ICD-10-CM | POA: Diagnosis not present

## 2022-08-20 DIAGNOSIS — K317 Polyp of stomach and duodenum: Secondary | ICD-10-CM | POA: Diagnosis not present

## 2022-08-20 DIAGNOSIS — K222 Esophageal obstruction: Secondary | ICD-10-CM | POA: Insufficient documentation

## 2022-08-20 DIAGNOSIS — E039 Hypothyroidism, unspecified: Secondary | ICD-10-CM

## 2022-08-20 DIAGNOSIS — K2289 Other specified disease of esophagus: Secondary | ICD-10-CM | POA: Diagnosis not present

## 2022-08-20 HISTORY — PX: ESOPHAGOGASTRODUODENOSCOPY (EGD) WITH PROPOFOL: SHX5813

## 2022-08-20 HISTORY — PX: BIOPSY: SHX5522

## 2022-08-20 HISTORY — PX: BALLOON DILATION: SHX5330

## 2022-08-20 HISTORY — PX: BOTOX INJECTION: SHX5754

## 2022-08-20 SURGERY — ESOPHAGOGASTRODUODENOSCOPY (EGD) WITH PROPOFOL
Anesthesia: Monitor Anesthesia Care

## 2022-08-20 MED ORDER — PROPOFOL 10 MG/ML IV BOLUS
INTRAVENOUS | Status: DC | PRN
  Administered 2022-08-20: 40 mg via INTRAVENOUS

## 2022-08-20 MED ORDER — SODIUM CHLORIDE (PF) 0.9 % IJ SOLN
100.0000 [IU] | Freq: Once | INTRAMUSCULAR | Status: AC
  Administered 2022-08-20: 5 mL via SUBMUCOSAL

## 2022-08-20 MED ORDER — PROPOFOL 1000 MG/100ML IV EMUL
INTRAVENOUS | Status: AC
  Filled 2022-08-20: qty 100

## 2022-08-20 MED ORDER — PROPOFOL 500 MG/50ML IV EMUL
INTRAVENOUS | Status: DC | PRN
  Administered 2022-08-20: 125 ug/kg/min via INTRAVENOUS

## 2022-08-20 MED ORDER — LACTATED RINGERS IV SOLN: INTRAVENOUS | Status: DC | PRN

## 2022-08-20 MED ORDER — LIDOCAINE 2% (20 MG/ML) 5 ML SYRINGE
INTRAMUSCULAR | Status: DC | PRN
  Administered 2022-08-20: 60 mg via INTRAVENOUS

## 2022-08-20 MED ORDER — SODIUM CHLORIDE 0.9 % IV SOLN: INTRAVENOUS | Status: DC

## 2022-08-20 MED ORDER — SODIUM CHLORIDE (PF) 0.9 % IJ SOLN
INTRAMUSCULAR | Status: AC
  Filled 2022-08-20: qty 10

## 2022-08-20 MED ORDER — ONABOTULINUMTOXINA 100 UNITS IJ SOLR
INTRAMUSCULAR | Status: AC
  Filled 2022-08-20: qty 100

## 2022-08-20 SURGICAL SUPPLY — 15 items

## 2022-08-20 NOTE — H&P (Signed)
[facility] H/P Note  Chief Complaint: dysphagia  HPI: Patrick Macias is an 82 y.o. male.  Returns for evaluation of dysphagia.  Past Medical History:  Diagnosis Date   Anxiety    Aortic atherosclerosis (HCC)    BPH (benign prostatic hyperplasia)    Carotid atherosclerosis    Cerumen impaction    Coronary artery calcification    Dyslipidemia    Frequent PVCs    H/O lithotripsy    Hearing decreased, bilateral    Hx of colonic polyps    IFG (impaired fasting glucose)    Macrocytic anemia    Macrocytosis    Memory loss    MGUS (monoclonal gammopathy of unknown significance)    Nephrolithiasis    TIA (transient ischemic attack)    Vitamin D deficiency     Past Surgical History:  Procedure Laterality Date   CATARACT EXTRACTION, BILATERAL     ESOPHAGEAL MANOMETRY N/A 04/16/2022   Procedure: ESOPHAGEAL MANOMETRY (EM);  Surgeon: Arta Silence, MD;  Location: [facility];  Service: Gastroenterology;  Laterality: N/A;   PROSTATECTOMY     Partial    Medications Prior to Admission  Medication Sig Dispense Refill   acetaminophen (TYLENOL) 500 MG tablet Take 1,000 mg by mouth every 6 (six) hours as needed for headache (pain).     aspirin EC 81 MG tablet Take 1 tablet (81 mg total) by mouth daily. (Patient taking differently: Take 81 mg by mouth at bedtime.) 90 tablet 3   buPROPion (WELLBUTRIN XL) 150 MG 24 hr tablet Take 150 mg by mouth every morning.     carboxymethylcellulose (REFRESH TEARS) 0.5 % SOLN Place 1 drop into both eyes 3 (three) times daily as needed (dry eyes).     cholecalciferol (VITAMIN D3) 25 MCG (1000 UNIT) tablet Take 1,000 Units by mouth See admin instructions. Take 1000 mg 5 days a week, Sun through [employer]     Cyanocobalamin (B-12) 3000 MCG CAPS Take 3,000 mcg by mouth daily.     donepezil (ARICEPT) 10 MG tablet Take 1 tablet (10 mg total) by mouth at bedtime. (Patient taking differently: Take 10 mg by mouth daily.) 90 tablet 4   escitalopram  (LEXAPRO) 20 MG tablet Take 20 mg by mouth every morning.     Melatonin 10 MG CAPS Take 20 mg by mouth at bedtime.     memantine (NAMENDA) 10 MG tablet Take 1 tablet (10 mg total) by mouth 2 (two) times daily. 180 tablet 4   Multiple Vitamin (MULTIVITAMIN) tablet Take 1 tablet by mouth daily.     propranolol ER (INDERAL LA) 60 MG 24 hr capsule Take 1 capsule (60 mg total) by mouth daily. 90 capsule 1   rosuvastatin (CRESTOR) 20 MG tablet Take 20 mg by mouth daily.      Allergies: No Known Allergies  Family History  Problem Relation Age of Onset   Leukemia Mother    Breast cancer Mother    Other Father        unknown medical history   Sudden death Neg Hx    Hypertension Neg Hx    Hyperlipidemia Neg Hx    Diabetes Neg Hx    Heart attack Neg Hx     Social History:  reports that he has never smoked. He has never used smokeless tobacco. He reports that he does not currently use alcohol. He reports that he does not use drugs.   ROS: As per HPI, all others negative   Blood pressure (!) 170/70,  pulse (!) 49, temperature (!) 97.1 F (36.2 C), temperature source Temporal, resp. rate 16, height 5' 11"$  (1.803 m), weight 79.4 kg, SpO2 97 %. General appearance: NAD HEENT:  [place]/AT, anicteric RESP:  No respiratory distress ABD:  Soft, non-tender NEURO:  A/O, no encephalopathy  No results found for this or any previous visit (from the past 48 hour(s)). No results found.  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 Endoscopy with possible esophageal balloon dilatation, possible botulinum toxin injection. Duodenal adenoma; can reassess at endoscopy. Risks (bleeding, infection, bowel perforation that could require surgery, sedation-related changes in cardiopulmonary systems), benefits (identification and possible treatment of source of symptoms, exclusion of certain causes of symptoms), and alternatives (watchful waiting, radiographic imaging studies, empiric medical  treatment) of upper endoscopy with possible dilatation (EGD +/- DIL +/- botox) were explained to patient/family in detail and patient wishes to proceed.   [name] 08/20/2022, 11:27 AM

## 2022-08-20 NOTE — Transfer of Care (Signed)
Immediate Anesthesia Transfer of Care Note  Patient: Patrick Macias  Procedure(s) Performed: ESOPHAGOGASTRODUODENOSCOPY (EGD) WITH PROPOFOL (Bilateral) BALLOON DILATION (Bilateral) BOTOX INJECTION BIOPSY  Patient Location: PACU  Anesthesia Type:MAC  Level of Consciousness: sedated and responds to stimulation  Airway & Oxygen Therapy: Patient Spontanous Breathing and Patient connected to face mask oxygen  Post-op Assessment: Report given to RN and Post -op Vital signs reviewed and stable  Post vital signs: Reviewed and stable  Last Vitals:  Vitals Value Taken Time  BP    Temp    Pulse 52 08/20/22 1157  Resp    SpO2 95 % 08/20/22 1157  Vitals shown include unvalidated device data.  Last Pain:  Vitals:   08/20/22 1020  TempSrc: Temporal  PainSc: 0-No pain         Complications: No notable events documented.

## 2022-08-20 NOTE — Anesthesia Preprocedure Evaluation (Signed)
Anesthesia Evaluation  Patient identified by MRN, date of birth, ID band Patient awake    Reviewed: Allergy & Precautions, NPO status , Patient's Chart, lab work & pertinent test results, reviewed documented beta blocker date and time   Airway Mallampati: I       Dental no notable dental hx.    Pulmonary neg pulmonary ROS   Pulmonary exam normal        Cardiovascular hypertension, Pt. on home beta blockers Normal cardiovascular exam     Neuro/Psych  PSYCHIATRIC DISORDERS Anxiety    Dementia    GI/Hepatic negative GI ROS, Neg liver ROS,,,  Endo/Other    Renal/GU   negative genitourinary   Musculoskeletal negative musculoskeletal ROS (+)    Abdominal Normal abdominal exam  (+)   Peds  Hematology   Anesthesia Other Findings   Reproductive/Obstetrics                             Anesthesia Physical Anesthesia Plan  ASA: 2  Anesthesia Plan: MAC   Post-op Pain Management:    Induction:   PONV Risk Score and Plan: Propofol infusion  Airway Management Planned: Natural Airway and Mask  Additional Equipment: None  Intra-op Plan:   Post-operative Plan:   Informed Consent: I have reviewed the patients History and Physical, chart, labs and discussed the procedure including the risks, benefits and alternatives for the proposed anesthesia with the patient or authorized representative who has indicated his/her understanding and acceptance.     Dental advisory given  Plan Discussed with: CRNA  Anesthesia Plan Comments:        Anesthesia Quick Evaluation

## 2022-08-20 NOTE — Op Note (Signed)
[facility] Patient Name: Patrick Macias Procedure Date: 08/20/2022 MRN: [id number] Attending MD: Arta Silence , MD, [id number] Date of Birth: 05-28-1941 CSN: [id number] Age: 83 Admit Type: Outpatient Procedure:                Upper GI endoscopy Indications:              Dysphagia, abnormal manometry (hypertensive LES) Providers:                Arta Silence, MD, Jaci Carrel, RN, Brien Mates, Technician Referring MD:             Arta Silence, MD Medicines:                Monitored Anesthesia Care Complications:            No immediate complications. Estimated Blood Loss:     Estimated blood loss: none. Procedure:                Pre-Anesthesia Assessment:                           - Prior to the procedure, a History and Physical                            was performed, and patient medications and                            allergies were reviewed. The patient's tolerance of                            previous anesthesia was also reviewed. The risks                            and benefits of the procedure and the sedation                            options and risks were discussed with the patient.                            All questions were answered, and informed consent                            was obtained. Prior Anticoagulants: The patient has                            taken no anticoagulant or antiplatelet agents. ASA                            Grade Assessment: II - A patient with mild systemic                            disease. After reviewing the risks and benefits,  the patient was deemed in satisfactory condition to                            undergo the procedure.                           After obtaining informed consent, the endoscope was                            passed under direct vision. Throughout the                            procedure, the patient's blood pressure, pulse, and                             oxygen saturations were monitored continuously. The                            [id number] Olympus endoscope was introduced                            through the mouth, and advanced to the second part                            of duodenum. The upper GI endoscopy was                            accomplished without difficulty. The patient                            tolerated the procedure well. Scope In: Scope Out: Findings:      A small hiatal hernia was present.      One benign-appearing, intrinsic mild stenosis was found. The stenosis       was traversed. A TTS dilator was passed through the scope. Dilation with       a 15-16.5-18 mm balloon dilator was performed to 17 mm.      Normal mucosa was found in the distal esophagus. Approximately 1 cm       proximal to GE junction, 100 units botulinum toxin (25 units = 1 mL,       each of 4 quadrants) was successfully injected.      The exam of the esophagus was otherwise normal.      The entire examined stomach was normal.      A single 15 mm sessile polyp was found in the first portion of the       duodenum. Biopsies were taken with a cold forceps for histology. Impression:               - Small hiatal hernia.                           - Benign-appearing esophageal stenosis. Dilated.                           - Normal mucosa was found in the distal esophagus.  Injected with botulinum toxin.                           - Normal stomach.                           - A single duodenal polyp. Biopsied. Moderate Sedation:      None Recommendation:           - Patient has a contact number available for                            emergencies. The signs and symptoms of potential                            delayed complications were discussed with the                            patient. Return to normal activities tomorrow.                            Written discharge instructions were  provided to the                            patient.                           - Discharge patient to home (via wheelchair).                           - Soft diet today.                           - Continue present medications.                           - Follow clinical response to esophageal dilatation.                           - Await pathology results.                           - Return to GI clinic (date not yet determined).                           - Return to referring physician as previously                            scheduled. Procedure Code(s):        --- Professional ---                           [phone], Esophagogastroduodenoscopy, flexible,                            transoral; with transendoscopic balloon dilation of  esophagus (less than 30 mm diameter)                           43239, 59, Esophagogastroduodenoscopy, flexible,                            transoral; with biopsy, single or multiple                           43236, 59, Esophagogastroduodenoscopy, flexible,                            transoral; with directed submucosal injection(s),                            any substance Diagnosis Code(s):        --- Professional ---                           K44.9, Diaphragmatic hernia without obstruction or                            gangrene                           K22.2, Esophageal obstruction                           K31.7, Polyp of stomach and duodenum                           R13.10, Dysphagia, unspecified CPT copyright 2022 [employer]. All rights reserved. The codes documented in this report are preliminary and upon coder review may  be revised to meet current compliance requirements. Arta Silence, MD 08/20/2022 12:15:40 PM This report has been signed electronically. Number of Addenda: 0

## 2022-08-20 NOTE — Anesthesia Procedure Notes (Signed)
Procedure Name: MAC Date/Time: 08/20/2022 11:30 AM  Performed by: Claudia Desanctis, CRNAPre-anesthesia Checklist: Patient identified, Emergency Drugs available, Suction available and Patient being monitored Patient Re-evaluated:Patient Re-evaluated prior to induction Oxygen Delivery Method: Simple face mask

## 2022-08-20 NOTE — Discharge Instructions (Signed)
YOU HAD AN ENDOSCOPIC PROCEDURE TODAY: Refer to the procedure report and other information in the discharge instructions given to you for any specific questions about what was found during the examination. If this information does not answer your questions, please call Dr. Celesta Aver office at [phone] to clarify.   YOU SHOULD EXPECT: Some feelings of bloating in the abdomen. Passage of more gas than usual. Walking can help get rid of the air that was put into your GI tract during the procedure and reduce the bloating. If you had a lower endoscopy (such as a colonoscopy or flexible sigmoidoscopy) you may notice spotting of blood in your stool or on the toilet paper. Some abdominal soreness may be present for a day or two, also.  DIET: Your first meal following the procedure should be a light meal and then it is ok to progress to your normal diet. A half-sandwich or bowl of soup is an example of a good first meal. Heavy or fried foods are harder to digest and may make you feel nauseous or bloated. Drink plenty of fluids but you should avoid alcoholic beverages for 24 hours.   ACTIVITY: Your care partner should take you home directly after the procedure. You should plan to take it easy, moving slowly for the rest of the day. You can resume normal activity the day after the procedure however YOU SHOULD NOT DRIVE, use power tools, machinery or perform tasks that involve climbing or major physical exertion for 24 hours (because of the sedation medicines used during the test).   SYMPTOMS TO REPORT IMMEDIATELY: A gastroenterologist can be reached at any hour. Please call [phone]  for any of the following symptoms:  Following lower endoscopy (colonoscopy, flexible sigmoidoscopy) Excessive amounts of blood in the stool  Significant tenderness, worsening of abdominal pains  Swelling of the abdomen that is new, acute  Fever of 100 or higher  Following upper endoscopy (EGD, EUS, ERCP, esophageal  dilation) Vomiting of blood or coffee ground material  New, significant abdominal pain  New, significant chest pain or pain under the shoulder blades  Painful or persistently difficult swallowing  New shortness of breath  Black, tarry-looking or red, bloody stools  FOLLOW UP:  If any biopsies were taken you will be contacted by phone or by letter within the next 1-3 weeks. Call [phone]  if you have not heard about the biopsies in 3 weeks.  Please also call with any specific questions about appointments or follow up tests.

## 2022-08-21 DIAGNOSIS — R8290 Unspecified abnormal findings in urine: Secondary | ICD-10-CM | POA: Diagnosis not present

## 2022-08-21 DIAGNOSIS — N200 Calculus of kidney: Secondary | ICD-10-CM | POA: Diagnosis not present

## 2022-08-22 ENCOUNTER — Ambulatory Visit: Payer: Medicare HMO | Admitting: Speech Pathology

## 2022-08-22 ENCOUNTER — Encounter (HOSPITAL_COMMUNITY): Payer: Self-pay | Admitting: Gastroenterology

## 2022-08-22 DIAGNOSIS — R41841 Cognitive communication deficit: Secondary | ICD-10-CM

## 2022-08-22 DIAGNOSIS — R69 Illness, unspecified: Secondary | ICD-10-CM | POA: Diagnosis not present

## 2022-08-22 NOTE — Anesthesia Postprocedure Evaluation (Signed)
Anesthesia Post Note  Patient: Patrick Macias  Procedure(s) Performed: ESOPHAGOGASTRODUODENOSCOPY (EGD) WITH PROPOFOL (Bilateral) BALLOON DILATION (Bilateral) BOTOX INJECTION BIOPSY     Patient location during evaluation: Endoscopy Anesthesia Type: MAC Level of consciousness: awake Pain management: pain level controlled Vital Signs Assessment: post-procedure vital signs reviewed and stable Respiratory status: spontaneous breathing Cardiovascular status: stable Postop Assessment: no apparent nausea or vomiting Anesthetic complications: no  No notable events documented.  Last Vitals:  Vitals:   08/20/22 1210 08/20/22 1220  BP: (!) 143/61 (!) 141/55  Pulse: (!) 49 (!) 47  Resp: 15 14  Temp:    SpO2: 100% 99%    Last Pain:  Vitals:   08/21/22 1413  TempSrc:   PainSc: 0-No pain   Pain Goal:                   Huston Foley

## 2022-08-22 NOTE — Therapy (Signed)
OUTPATIENT SPEECH LANGUAGE PATHOLOGY TREATMENT NOTE   Patient Name: Patrick Macias MRN: [id number] DOB:[birth year], 82 y.o., male Today's Date: 08/22/2022  PCP: Crist Infante, MD REFERRING PROVIDER: Suzzanne Cloud NP  END OF SESSION:   End of Session - 08/22/22 0935     Visit Number 6    Number of Visits 17    Date for SLP Re-Evaluation 09/30/22    SLP Start Time 0930    SLP Stop Time  1010    SLP Time Calculation (min) 40 min    Activity Tolerance Patient tolerated treatment well             Past Medical History:  Diagnosis Date   Anxiety    Aortic atherosclerosis ([place])    BPH (benign prostatic hyperplasia)    Carotid atherosclerosis    Cerumen impaction    Coronary artery calcification    Dyslipidemia    Frequent PVCs    H/O lithotripsy    Hearing decreased, bilateral    Hx of colonic polyps    IFG (impaired fasting glucose)    Macrocytic anemia    Macrocytosis    Memory loss    MGUS (monoclonal gammopathy of unknown significance)    Nephrolithiasis    TIA (transient ischemic attack)    Vitamin D deficiency    Past Surgical History:  Procedure Laterality Date   BALLOON DILATION Bilateral 08/20/2022   Procedure: BALLOON DILATION;  Surgeon: Arta Silence, MD;  Location: [facility];  Service: Gastroenterology;  Laterality: Bilateral;   BIOPSY  08/20/2022   Procedure: BIOPSY;  Surgeon: Arta Silence, MD;  Location: [facility];  Service: Gastroenterology;;   BOTOX INJECTION N/A 08/20/2022   Procedure: BOTOX INJECTION;  Surgeon: Arta Silence, MD;  Location: [facility];  Service: Gastroenterology;  Laterality: N/A;  100 units   CATARACT EXTRACTION, BILATERAL     ESOPHAGEAL MANOMETRY N/A 04/16/2022   Procedure: ESOPHAGEAL MANOMETRY (EM);  Surgeon: Arta Silence, MD;  Location: [facility];  Service: Gastroenterology;  Laterality: N/A;   ESOPHAGOGASTRODUODENOSCOPY (EGD) WITH PROPOFOL Bilateral 08/20/2022   Procedure: ESOPHAGOGASTRODUODENOSCOPY (EGD)  WITH PROPOFOL;  Surgeon: Arta Silence, MD;  Location: [facility];  Service: Gastroenterology;  Laterality: Bilateral;   PROSTATECTOMY     Partial   Patient Active Problem List   Diagnosis Date Noted   Macrocytic anemia 04/02/2021   MGUS (monoclonal gammopathy of unknown significance) 12/28/2020   Cerebral vascular disease 08/01/2020   SIRS (systemic inflammatory response syndrome) (HCC) 0000000   Metabolic encephalopathy 0000000   Cognitive impairment 06/04/2020   TIA (transient ischemic attack) 06/01/2020   Dementia without behavioral disturbance ([place]) 06/01/2020   Anxiety 06/01/2020   HTN (hypertension) 06/01/2020   Right shoulder pain 08/10/2013    ONSET DATE: 07/30/22  REFERRING DIAG:  [id number] (ICD-10-CM) - Dementia with behavioral disturbance (HCC)    THERAPY DIAG:  Cognitive communication deficit  Rationale for Evaluation and Treatment: Rehabilitation  SUBJECTIVE:   SUBJECTIVE STATEMENT: "I cannot recall"  PAIN:  Are you having pain? No    OBJECTIVE:   TODAY'S TREATMENT:  DATE:   08/22/22: Patient was seen for skilled ST services with focus on cognitive skills. Wife had provided SLP with pictures of items that pt had difficulty navigating at home. SLP went through each picture with patient to identify where errors were being made, so SLP could make a guide to assist. Wife reports benefit from labeling items. SLP assisted pt with telephone - teaching "SIRI, call ____ ([phone])" in case of emergency. SLP updated pt's favorites to include only emergency contacts. Pt demonstrated carry over of "I cannot recall" at the end of session without any cueing when asked a detail. Pt reports he has been working on using that phrase. Cont with this next session (or begin memory strategoes).   08/19/22: Patient was seen for skilled ST services  with focus on communication skills. Wife provided examples of communication breakdown that occurred when driving to doctor's office. SLP provided solution of using GPS to decrease stress while driving for [name]. Continued edu on communication strategies. 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 [name] was provided with visual to take home reading the following: "I can't recall." And "Can you repeat that?" SLP continues to encourage pt repeating back information to increase comprehension. SLP encouraged use of labels in the house as pt wife reported difficulty finding items (in the fridge). To finish up communication strategies next session.   08/15/22: Patient was seen for skilled ST services this date. Patient was accompanied by wife this session. Reviewed success with communication strategies at home. SLP clarified all questions wife had regarding information. Pt appeared "lost" during today's session. SLP encouraged pt to repeat back words heard to increase processing and comprehension of verbal information. SLP suspects this was helpful, as several times prior to this, pt was unable to recall any information. Wife and pt identified strategies they would like to work on more at home re: providing more processing time, not filling in pt's ideas/sentences, having pt repeat back instructions. At times, pt nods and pretends he understands information, or looks to wife to fill in gaps. SLP provided visual for pt to refer to when he could not remember information ( I can't recall) and encouraged honesty vs. "Fake it til you make it". Will continue to address communication strategies next session.   08/08/22: Patient was seen for skilled ST services this date.  Patient was accompanied by wife this session.  SLP reviewed scores from assessment with wife.  Wife reports she feels patient functions higher than what the score suggests.  SLP explained that  assessments are not functional in nature; therefore, they are able to show [place] how we are functioning outside of the treatment room.  Wife reports patient cannot perform the following tasks effectively or efficiently re: navigating phone, remote, microwave, stove.  SLP suggested wife take pictures of items so SLP is able to create visual cues.  Wife also reports patient has difficulty with organization of prior work tasks (paperwork).  SLP to assist in developing a system to assist.  Began to occasion on communication strategies, as patient has difficulty comprehending verbal information.   08/05/22: Pt was seen for skilled ST services this date. SLP completed edu on cognitive-communication, purpose of speech therapy, and completed the CLQT.   Pt scored the following:  Attention: Mild Memory: Mild Executive functions: Mod Language: WNL Visuospatial skills: Mod  PATIENT EDUCATION: Education details: cog-comm Person educated: Patient and Spouse Education method: Theatre stage manager Education comprehension:  verbalized understanding and needs further education     GOALS: Goals reviewed with patient? Yes   SHORT TERM GOALS: Target date: 08/29/22   Patient will increase auditory comprehension by recalling and verbalizing strategies to assist with active listening during conversation with min assist.  Baseline: Goal status: INITIAL   2.  Patient will comprehend functional memory or visual aids for recall of important information during structured conversations with min assist.  Baseline:  Goal status: INITIAL   3.  Pt will complete medication management task with spv.  Baseline:  Goal status: INITIAL       LONG TERM GOALS: Target date: 09/29/22   Patient will increase auditory comprehension by recalling and verbalizing strategies to assist with active listening during unstructured conversations independently.  Baseline:  Goal status: INITIAL   2.   Patient will demonstrate  understanding and verbalize use of functional memory\visual aids to recall important information and unstructured conversations independently  Baseline:  Goal status: INITIAL     ASSESSMENT:   CLINICAL IMPRESSION: Pt is a 82 yo male who presents to [facility] for evaluation for cognition 2/2 to dementia diagnosis. see tx note. SLP rec skilled ST services to address cognitive communication impairment to maximize functional communication and independence.      OBJECTIVE IMPAIRMENTS: include attention, memory, awareness, and executive functioning. These impairments are limiting patient from ADLs/IADLs and effectively communicating at home and in community. Factors affecting potential to achieve goals and functional outcome are ability to learn/carryover information.. Patient will benefit from skilled SLP services to address above impairments and improve overall function.   REHAB POTENTIAL: Good   PLAN:   SLP FREQUENCY: 2x/week   SLP DURATION: 8 weeks   PLANNED INTERVENTIONS: Environmental controls, Cueing hierachy, Cognitive reorganization, Internal/external aids, Functional tasks, SLP instruction and feedback, and Compensatory strategies       Verdene Lennert, CCC-SLP 08/22/2022, 9:36 AM

## 2022-08-23 LAB — SURGICAL PATHOLOGY

## 2022-08-26 ENCOUNTER — Ambulatory Visit: Payer: Medicare HMO | Admitting: Speech Pathology

## 2022-08-26 DIAGNOSIS — R69 Illness, unspecified: Secondary | ICD-10-CM | POA: Diagnosis not present

## 2022-08-26 DIAGNOSIS — R41841 Cognitive communication deficit: Secondary | ICD-10-CM | POA: Diagnosis not present

## 2022-08-26 NOTE — Therapy (Signed)
OUTPATIENT SPEECH LANGUAGE PATHOLOGY TREATMENT NOTE   Patient Name: Patrick Macias MRN: [id number] DOB:1941-04-15, 82 y.o., male Today's Date: 08/22/2022  PCP: Patrick Infante, MD REFERRING PROVIDER: Suzzanne Cloud NP  END OF SESSION:   End of Session - 08/22/22 0935     Visit Number 6    Number of Visits 17    Date for SLP Re-Evaluation 09/30/22    SLP Start Time 0930    SLP Stop Time  1010    SLP Time Calculation (min) 40 min    Activity Tolerance Patient tolerated treatment well             Past Medical History:  Diagnosis Date   Anxiety    Aortic atherosclerosis ([place])    BPH (benign prostatic hyperplasia)    Carotid atherosclerosis    Cerumen impaction    Coronary artery calcification    Dyslipidemia    Frequent PVCs    H/O lithotripsy    Hearing decreased, bilateral    Hx of colonic polyps    IFG (impaired fasting glucose)    Macrocytic anemia    Macrocytosis    Memory loss    MGUS (monoclonal gammopathy of unknown significance)    Nephrolithiasis    TIA (transient ischemic attack)    Vitamin D deficiency    Past Surgical History:  Procedure Laterality Date   BALLOON DILATION Bilateral 08/20/2022   Procedure: BALLOON DILATION;  Surgeon: Arta Silence, MD;  Location: [facility];  Service: Gastroenterology;  Laterality: Bilateral;   BIOPSY  08/20/2022   Procedure: BIOPSY;  Surgeon: Arta Silence, MD;  Location: [facility];  Service: Gastroenterology;;   BOTOX INJECTION N/A 08/20/2022   Procedure: BOTOX INJECTION;  Surgeon: Arta Silence, MD;  Location: [facility];  Service: Gastroenterology;  Laterality: N/A;  100 units   CATARACT EXTRACTION, BILATERAL     ESOPHAGEAL MANOMETRY N/A 04/16/2022   Procedure: ESOPHAGEAL MANOMETRY (EM);  Surgeon: Arta Silence, MD;  Location: [facility];  Service: Gastroenterology;  Laterality: N/A;   ESOPHAGOGASTRODUODENOSCOPY (EGD) WITH PROPOFOL Bilateral 08/20/2022   Procedure: ESOPHAGOGASTRODUODENOSCOPY (EGD)  WITH PROPOFOL;  Surgeon: Arta Silence, MD;  Location: [facility];  Service: Gastroenterology;  Laterality: Bilateral;   PROSTATECTOMY     Partial   Patient Active Problem List   Diagnosis Date Noted   Macrocytic anemia 04/02/2021   MGUS (monoclonal gammopathy of unknown significance) 12/28/2020   Cerebral vascular disease 08/01/2020   SIRS (systemic inflammatory response syndrome) (HCC) 0000000   Metabolic encephalopathy 0000000   Cognitive impairment 06/04/2020   TIA (transient ischemic attack) 06/01/2020   Dementia without behavioral disturbance ([place]) 06/01/2020   Anxiety 06/01/2020   HTN (hypertension) 06/01/2020   Right shoulder pain 08/10/2013    ONSET DATE: 07/30/22  REFERRING DIAG:  [id number] (ICD-10-CM) - Dementia with behavioral disturbance (HCC)    THERAPY DIAG:  Cognitive communication deficit  Rationale for Evaluation and Treatment: Rehabilitation  SUBJECTIVE:   SUBJECTIVE STATEMENT: "I practiced that!"  PAIN:  Are you having pain? Yes  -Lumbar back pain -4/10  -Went to sleep last night and it improved from an 8/10.   OBJECTIVE:   TODAY'S TREATMENT:  DATE:   08/26/22: Patient was seen for skilled ST services with focus on cognitive skills. Provided wife and pt with printed pictures of remote, microwave, and phone. Pt was able to sequence using pictures and demonstrate use of remote and phone with minA verbal cues. SLP began edu on memory strategies this session. Pt is currently not using a calendar independently; however, SLP suggested using one to assist with appointments. Wife to use a "weekly" calendar and review with him every day. SLP also recommended pt get a notebook and use this to write down questions to increase recall of important information (I.e. questions for doc, SLP etc.)     08/22/22: Patient was seen  for skilled ST services with focus on cognitive skills. Wife had provided SLP with pictures of items that pt had difficulty navigating at home. SLP went through each picture with patient to identify where errors were being made, so SLP could make a guide to assist. Wife reports benefit from labeling items. SLP assisted pt with telephone - teaching "SIRI, call ____ (911)" in case of emergency. SLP updated pt's favorites to include only emergency contacts. Pt demonstrated carry over of "I cannot recall" at the end of session without any cueing when asked a detail. Pt reports he has been working on using that phrase. Cont with this next session (or begin memory strategies).   08/19/22: Patient was seen for skilled ST services with focus on communication skills. Wife provided examples of communication breakdown that occurred when driving to doctor's office. SLP provided solution of using GPS to decrease stress while driving for [name]. Continued edu on communication strategies. 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 [name] was provided with visual to take home reading the following: "I can't recall." And "Can you repeat that?" SLP continues to encourage pt repeating back information to increase comprehension. SLP encouraged use of labels in the house as pt wife reported difficulty finding items (in the fridge). To finish up communication strategies next session.   08/15/22: Patient was seen for skilled ST services this date. Patient was accompanied by wife this session. Reviewed success with communication strategies at home. SLP clarified all questions wife had regarding information. Pt appeared "lost" during today's session. SLP encouraged pt to repeat back words heard to increase processing and comprehension of verbal information. SLP suspects this was helpful, as several times prior to this, pt was unable to recall any information. Wife and pt  identified strategies they would like to work on more at home re: providing more processing time, not filling in pt's ideas/sentences, having pt repeat back instructions. At times, pt nods and pretends he understands information, or looks to wife to fill in gaps. SLP provided visual for pt to refer to when he could not remember information ( I can't recall) and encouraged honesty vs. "Fake it til you make it". Will continue to address communication strategies next session.   08/08/22: Patient was seen for skilled ST services this date.  Patient was accompanied by wife this session.  SLP reviewed scores from assessment with wife.  Wife reports she feels patient functions higher than what the score suggests.  SLP explained that assessments are not functional in nature; therefore, they are able to show [place] how we are functioning outside of the treatment room.  Wife reports patient cannot perform the following tasks effectively or efficiently re: navigating phone, remote, microwave, stove.  SLP suggested wife take pictures  of items so SLP is able to create visual cues.  Wife also reports patient has difficulty with organization of prior work tasks (paperwork).  SLP to assist in developing a system to assist.  Began to occasion on communication strategies, as patient has difficulty comprehending verbal information.   08/05/22: Pt was seen for skilled ST services this date. SLP completed edu on cognitive-communication, purpose of speech therapy, and completed the CLQT.   Pt scored the following:  Attention: Mild Memory: Mild Executive functions: Mod Language: WNL Visuospatial skills: Mod  PATIENT EDUCATION: Education details: cog-comm Person educated: Patient and Spouse Education method: Theatre stage manager Education comprehension: verbalized understanding and needs further education     GOALS: Goals reviewed with patient? Yes   SHORT TERM GOALS: Target date: 08/29/22   Patient will increase  auditory comprehension by recalling and verbalizing strategies to assist with active listening during conversation with min assist.  Baseline: Goal status: INITIAL   2.  Patient will comprehend functional memory or visual aids for recall of important information during structured conversations with min assist.  Baseline:  Goal status: INITIAL   3.  Pt will complete medication management task with spv.  Baseline:  Goal status: INITIAL       LONG TERM GOALS: Target date: 09/29/22   Patient will increase auditory comprehension by recalling and verbalizing strategies to assist with active listening during unstructured conversations independently.  Baseline:  Goal status: INITIAL   2.   Patient will demonstrate understanding and verbalize use of functional memory\visual aids to recall important information and unstructured conversations independently  Baseline:  Goal status: INITIAL     ASSESSMENT:   CLINICAL IMPRESSION: Pt is a 82 yo male who presents to [place] for evaluation for cognition 2/2 to dementia diagnosis. see tx note. SLP rec skilled ST services to address cognitive communication impairment to maximize functional communication and independence.      OBJECTIVE IMPAIRMENTS: include attention, memory, awareness, and executive functioning. These impairments are limiting patient from ADLs/IADLs and effectively communicating at home and in community. Factors affecting potential to achieve goals and functional outcome are ability to learn/carryover information.. Patient will benefit from skilled SLP services to address above impairments and improve overall function.   REHAB POTENTIAL: Good   PLAN:   SLP FREQUENCY: 2x/week   SLP DURATION: 8 weeks   PLANNED INTERVENTIONS: Environmental controls, Cueing hierachy, Cognitive reorganization, Internal/external aids, Functional tasks, SLP instruction and feedback, and Compensatory strategies       Verdene Lennert,  CCC-SLP 08/22/2022, 9:36 AM

## 2022-08-29 ENCOUNTER — Ambulatory Visit: Payer: Medicare HMO | Admitting: Speech Pathology

## 2022-09-02 ENCOUNTER — Ambulatory Visit: Payer: Medicare HMO | Attending: Neurology | Admitting: Speech Pathology

## 2022-09-02 DIAGNOSIS — R41841 Cognitive communication deficit: Secondary | ICD-10-CM | POA: Insufficient documentation

## 2022-09-03 NOTE — Therapy (Signed)
OUTPATIENT SPEECH LANGUAGE PATHOLOGY TREATMENT NOTE   Patient Name: Patrick Macias MRN: [id number] DOB:04-30-1941, 82 y.o., male Today's Date: 08/22/2022  PCP: Patrick Infante, MD REFERRING PROVIDER: Suzzanne Cloud NP  END OF SESSION:   End of Session - 08/22/22 0935     Visit Number 6    Number of Visits 17    Date for SLP Re-Evaluation 09/30/22    SLP Start Time 0930    SLP Stop Time  1010    SLP Time Calculation (min) 40 min    Activity Tolerance Patient tolerated treatment well             Past Medical History:  Diagnosis Date   Anxiety    Aortic atherosclerosis ([place])    BPH (benign prostatic hyperplasia)    Carotid atherosclerosis    Cerumen impaction    Coronary artery calcification    Dyslipidemia    Frequent PVCs    H/O lithotripsy    Hearing decreased, bilateral    Hx of colonic polyps    IFG (impaired fasting glucose)    Macrocytic anemia    Macrocytosis    Memory loss    MGUS (monoclonal gammopathy of unknown significance)    Nephrolithiasis    TIA (transient ischemic attack)    Vitamin D deficiency    Past Surgical History:  Procedure Laterality Date   BALLOON DILATION Bilateral 08/20/2022   Procedure: BALLOON DILATION;  Surgeon: Patrick Silence, MD;  Location: [facility];  Service: Gastroenterology;  Laterality: Bilateral;   BIOPSY  08/20/2022   Procedure: BIOPSY;  Surgeon: Patrick Silence, MD;  Location: [facility];  Service: Gastroenterology;;   BOTOX INJECTION N/A 08/20/2022   Procedure: BOTOX INJECTION;  Surgeon: Patrick Silence, MD;  Location: [facility];  Service: Gastroenterology;  Laterality: N/A;  100 units   CATARACT EXTRACTION, BILATERAL     ESOPHAGEAL MANOMETRY N/A 04/16/2022   Procedure: ESOPHAGEAL MANOMETRY (EM);  Surgeon: Patrick Silence, MD;  Location: [facility];  Service: Gastroenterology;  Laterality: N/A;   ESOPHAGOGASTRODUODENOSCOPY (EGD) WITH PROPOFOL Bilateral 08/20/2022   Procedure: ESOPHAGOGASTRODUODENOSCOPY (EGD)  WITH PROPOFOL;  Surgeon: Patrick Silence, MD;  Location: [facility];  Service: Gastroenterology;  Laterality: Bilateral;   PROSTATECTOMY     Partial   Patient Active Problem List   Diagnosis Date Noted   Macrocytic anemia 04/02/2021   MGUS (monoclonal gammopathy of unknown significance) 12/28/2020   Cerebral vascular disease 08/01/2020   SIRS (systemic inflammatory response syndrome) (HCC) 0000000   Metabolic encephalopathy 0000000   Cognitive impairment 06/04/2020   TIA (transient ischemic attack) 06/01/2020   Dementia without behavioral disturbance ([place]) 06/01/2020   Anxiety 06/01/2020   HTN (hypertension) 06/01/2020   Right shoulder pain 08/10/2013    ONSET DATE: 07/30/22  REFERRING DIAG:  [id number] (ICD-10-CM) - Dementia with behavioral disturbance (HCC)    THERAPY DIAG:  Cognitive communication deficit  Rationale for Evaluation and Treatment: Rehabilitation  SUBJECTIVE:   SUBJECTIVE STATEMENT: "I practiced that!"  PAIN:  Are you having pain? Yes  -Lumbar back pain -4/10  -Went to sleep last night and it improved from an 8/10.   OBJECTIVE:   TODAY'S TREATMENT:  DATE:   09/02/22: Patient was seen for skilled ST services with focus on cognitive skills. Continued edu on memory strategies this session. Wife reports successful use of calendar and notebook (pt wife prompting for use). Pt has carried over, "I cannot recall" when confused/can't remember. SLP cont to reinforce and encourage pt to repeat information back to increase comprehension of auditory information. SLP used orthographic cue if pt had difficulty with hearing. To cont next session.   08/26/22: Patient was seen for skilled ST services with focus on cognitive skills. Provided wife and pt with printed pictures of remote, microwave, and phone. Pt was able to sequence using  pictures and demonstrate use of remote and phone with minA verbal cues. SLP began edu on memory strategies this session. Pt is currently not using a calendar independently; however, SLP suggested using one to assist with appointments. Wife to use a "weekly" calendar and review with him every day. SLP also recommended pt get a notebook and use this to write down questions to increase recall of important information (I.e. questions for doc, SLP etc.)     08/22/22: Patient was seen for skilled ST services with focus on cognitive skills. Wife had provided SLP with pictures of items that pt had difficulty navigating at home. SLP went through each picture with patient to identify where errors were being made, so SLP could make a guide to assist. Wife reports benefit from labeling items. SLP assisted pt with telephone - teaching "Patrick Macias, call ____ (911)" in case of emergency. SLP updated pt's favorites to include only emergency contacts. Pt demonstrated carry over of "I cannot recall" at the end of session without any cueing when asked a detail. Pt reports he has been working on using that phrase. Cont with this next session (or begin memory strategies).   08/19/22: Patient was seen for skilled ST services with focus on communication skills. Wife provided examples of communication breakdown that occurred when driving to doctor's office. SLP provided solution of using GPS to decrease stress while driving for Patrick Macias. Continued edu on communication strategies. 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 Patrick Macias was provided with visual to take home reading the following: "I can't recall." And "Can you repeat that?" SLP continues to encourage pt repeating back information to increase comprehension. SLP encouraged use of labels in the house as pt wife reported difficulty finding items (in the fridge). To finish up communication strategies next session.   08/15/22:  Patient was seen for skilled ST services this date. Patient was accompanied by wife this session. Reviewed success with communication strategies at home. SLP clarified all questions wife had regarding information. Pt appeared "lost" during today's session. SLP encouraged pt to repeat back words heard to increase processing and comprehension of verbal information. SLP suspects this was helpful, as several times prior to this, pt was unable to recall any information. Wife and pt identified strategies they would like to work on more at home re: providing more processing time, not filling in pt's ideas/sentences, having pt repeat back instructions. At times, pt nods and pretends he understands information, or looks to wife to fill in gaps. SLP provided visual for pt to refer to when he could not remember information ( I can't recall) and encouraged honesty vs. "Fake it til you make it". Will continue to address communication strategies next session.   08/08/22: Patient was seen for skilled ST services this date.  Patient was  accompanied by wife this session.  SLP reviewed scores from assessment with wife.  Wife reports she feels patient functions higher than what the score suggests.  SLP explained that assessments are not functional in nature; therefore, they are able to show [place] how we are functioning outside of the treatment room.  Wife reports patient cannot perform the following tasks effectively or efficiently re: navigating phone, remote, microwave, stove.  SLP suggested wife take pictures of items so SLP is able to create visual cues.  Wife also reports patient has difficulty with organization of prior work tasks (paperwork).  SLP to assist in developing a system to assist.  Began to occasion on communication strategies, as patient has difficulty comprehending verbal information.   08/05/22: Pt was seen for skilled ST services this date. SLP completed edu on cognitive-communication, purpose of speech therapy, and  completed the CLQT.   Pt scored the following:  Attention: Mild Memory: Mild Executive functions: Mod Language: WNL Visuospatial skills: Mod  PATIENT EDUCATION: Education details: cog-comm Person educated: Patient and Spouse Education method: Theatre stage manager Education comprehension: verbalized understanding and needs further education     GOALS: Goals reviewed with patient? Yes   SHORT TERM GOALS: Target date: 08/29/22   Patient will increase auditory comprehension by recalling and verbalizing strategies to assist with active listening during conversation with min assist.  Baseline: Goal status: NOT MET   2.  Patient will comprehend functional memory or visual aids for recall of important information during structured conversations with min assist.  Baseline:  Goal status: NOT MET   3.  Pt will complete medication management task with spv.  Baseline:  Goal status: NOT MET       LONG TERM GOALS: Target date: 09/29/22   Patient will increase auditory comprehension by recalling and verbalizing strategies to assist with active listening during unstructured conversations independently.  Baseline:  Goal status: INITIAL   2.   Patient will demonstrate understanding and verbalize use of functional memory\visual aids to recall important information and unstructured conversations independently  Baseline:  Goal status: INITIAL     ASSESSMENT:   CLINICAL IMPRESSION: Pt is a 82 yo male who presents to [place] for evaluation for cognition 2/2 to dementia diagnosis. see tx note. SLP rec skilled ST services to address cognitive communication impairment to maximize functional communication and independence.      OBJECTIVE IMPAIRMENTS: include attention, memory, awareness, and executive functioning. These impairments are limiting patient from ADLs/IADLs and effectively communicating at home and in community. Factors affecting potential to achieve goals and functional outcome are  ability to learn/carryover information.. Patient will benefit from skilled SLP services to address above impairments and improve overall function.   REHAB POTENTIAL: Good   PLAN:   SLP FREQUENCY: 2x/week   SLP DURATION: 8 weeks   PLANNED INTERVENTIONS: Environmental controls, Cueing hierachy, Cognitive reorganization, Internal/external aids, Functional tasks, SLP instruction and feedback, and Compensatory strategies       Verdene Lennert, CCC-SLP 08/22/2022, 9:36 AM

## 2022-09-05 ENCOUNTER — Encounter: Payer: Self-pay | Admitting: Speech Pathology

## 2022-09-05 ENCOUNTER — Ambulatory Visit: Payer: Medicare HMO | Admitting: Speech Pathology

## 2022-09-05 DIAGNOSIS — R41841 Cognitive communication deficit: Secondary | ICD-10-CM | POA: Diagnosis not present

## 2022-09-05 NOTE — Therapy (Signed)
OUTPATIENT SPEECH LANGUAGE PATHOLOGY TREATMENT NOTE   Patient Name: Patrick Macias MRN: [id number] DOB:Oct 27, 1940, 82 y.o., male Today's Date: 09/05/2022  PCP: Crist Infante, MD REFERRING PROVIDER: Suzzanne Cloud NP  END OF SESSION:   End of Session - 09/05/22 0942     Visit Number 9    Number of Visits 17    Date for SLP Re-Evaluation 09/30/22    SLP Start Time 0940    SLP Stop Time  [id number]    SLP Time Calculation (min) 35 min    Activity Tolerance Patient tolerated treatment well             Past Medical History:  Diagnosis Date   Anxiety    Aortic atherosclerosis ([place])    BPH (benign prostatic hyperplasia)    Carotid atherosclerosis    Cerumen impaction    Coronary artery calcification    Dyslipidemia    Frequent PVCs    H/O lithotripsy    Hearing decreased, bilateral    Hx of colonic polyps    IFG (impaired fasting glucose)    Macrocytic anemia    Macrocytosis    Memory loss    MGUS (monoclonal gammopathy of unknown significance)    Nephrolithiasis    TIA (transient ischemic attack)    Vitamin D deficiency    Past Surgical History:  Procedure Laterality Date   BALLOON DILATION Bilateral 08/20/2022   Procedure: BALLOON DILATION;  Surgeon: Arta Silence, MD;  Location: [facility];  Service: Gastroenterology;  Laterality: Bilateral;   BIOPSY  08/20/2022   Procedure: BIOPSY;  Surgeon: Arta Silence, MD;  Location: [facility];  Service: Gastroenterology;;   BOTOX INJECTION N/A 08/20/2022   Procedure: BOTOX INJECTION;  Surgeon: Arta Silence, MD;  Location: [facility];  Service: Gastroenterology;  Laterality: N/A;  100 units   CATARACT EXTRACTION, BILATERAL     ESOPHAGEAL MANOMETRY N/A 04/16/2022   Procedure: ESOPHAGEAL MANOMETRY (EM);  Surgeon: Arta Silence, MD;  Location: [facility];  Service: Gastroenterology;  Laterality: N/A;   ESOPHAGOGASTRODUODENOSCOPY (EGD) WITH PROPOFOL Bilateral 08/20/2022   Procedure: ESOPHAGOGASTRODUODENOSCOPY (EGD)  WITH PROPOFOL;  Surgeon: Arta Silence, MD;  Location: [facility];  Service: Gastroenterology;  Laterality: Bilateral;   PROSTATECTOMY     Partial   Patient Active Problem List   Diagnosis Date Noted   Macrocytic anemia 04/02/2021   MGUS (monoclonal gammopathy of unknown significance) 12/28/2020   Cerebral vascular disease 08/01/2020   SIRS (systemic inflammatory response syndrome) (HCC) 0000000   Metabolic encephalopathy 0000000   Cognitive impairment 06/04/2020   TIA (transient ischemic attack) 06/01/2020   Dementia without behavioral disturbance ([place]) 06/01/2020   Anxiety 06/01/2020   HTN (hypertension) 06/01/2020   Right shoulder pain 08/10/2013    ONSET DATE: 07/30/22  REFERRING DIAG:  [id number] (ICD-10-CM) - Dementia with behavioral disturbance (HCC)    THERAPY DIAG:  Cognitive communication deficit  Rationale for Evaluation and Treatment: Rehabilitation  SUBJECTIVE:   SUBJECTIVE STATEMENT: Pt has difficulty with time management as reported by wife - late today.  PAIN:  Are you having pain? No    OBJECTIVE:   TODAY'S TREATMENT:  DATE:   09/05/22: Patient was seen for skilled ST services with focus on cognitive skills.  Patient and wife were late to treatment this date.  Wife reported that he had a communication breakdown in which she was telling patient therapy was at 34 and patient thought therapy was at 79.  SLP encouraged wife to reduce environmental distractions Re: turning off TV, so patient is able to focus fully on their conversation.  Patient reports frustration with reduced independence 2/2 to dementia.  SLP encouraged patient to do more things he enjoys re: fishing.  Reviewed importance of using repetition and clarification to decrease communication breakdowns.  To cont with edu on memory strategies next session.    09/02/22: Patient was seen for skilled ST services with focus on cognitive skills. Continued edu on memory strategies this session. Wife reports successful use of calendar and notebook (pt wife prompting for use). Pt has carried over, "I cannot recall" when confused/can't remember. SLP cont to reinforce and encourage pt to repeat information back to increase comprehension of auditory information. SLP used orthographic cue if pt had difficulty with hearing. To cont next session.   08/26/22: Patient was seen for skilled ST services with focus on cognitive skills. Provided wife and pt with printed pictures of remote, microwave, and phone. Pt was able to sequence using pictures and demonstrate use of remote and phone with minA verbal cues. SLP began edu on memory strategies this session. Pt is currently not using a calendar independently; however, SLP suggested using one to assist with appointments. Wife to use a "weekly" calendar and review with him every day. SLP also recommended pt get a notebook and use this to write down questions to increase recall of important information (I.e. questions for doc, SLP etc.)     08/22/22: Patient was seen for skilled ST services with focus on cognitive skills. Wife had provided SLP with pictures of items that pt had difficulty navigating at home. SLP went through each picture with patient to identify where errors were being made, so SLP could make a guide to assist. Wife reports benefit from labeling items. SLP assisted pt with telephone - teaching "SIRI, call ____ (911)" in case of emergency. SLP updated pt's favorites to include only emergency contacts. Pt demonstrated carry over of "I cannot recall" at the end of session without any cueing when asked a detail. Pt reports he has been working on using that phrase. Cont with this next session (or begin memory strategies).   08/19/22: Patient was seen for skilled ST services with focus on communication skills. Wife provided  examples of communication breakdown that occurred when driving to doctor's office. SLP provided solution of using GPS to decrease stress while driving for [name]. Continued edu on communication strategies. 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 [name] was provided with visual to take home reading the following: "I can't recall." And "Can you repeat that?" SLP continues to encourage pt repeating back information to increase comprehension. SLP encouraged use of labels in the house as pt wife reported difficulty finding items (in the fridge). To finish up communication strategies next session.   08/15/22: Patient was seen for skilled ST services this date. Patient was accompanied by wife this session. Reviewed success with communication strategies at home. SLP clarified all questions wife had regarding information. Pt appeared "lost" during today's session. SLP encouraged pt to repeat back words heard to increase processing and comprehension of  verbal information. SLP suspects this was helpful, as several times prior to this, pt was unable to recall any information. Wife and pt identified strategies they would like to work on more at home re: providing more processing time, not filling in pt's ideas/sentences, having pt repeat back instructions. At times, pt nods and pretends he understands information, or looks to wife to fill in gaps. SLP provided visual for pt to refer to when he could not remember information ( I can't recall) and encouraged honesty vs. "Fake it til you make it". Will continue to address communication strategies next session.   08/08/22: Patient was seen for skilled ST services this date.  Patient was accompanied by wife this session.  SLP reviewed scores from assessment with wife.  Wife reports she feels patient functions higher than what the score suggests.  SLP explained that assessments are not functional in nature; therefore,  they are able to show [place] how we are functioning outside of the treatment room.  Wife reports patient cannot perform the following tasks effectively or efficiently re: navigating phone, remote, microwave, stove.  SLP suggested wife take pictures of items so SLP is able to create visual cues.  Wife also reports patient has difficulty with organization of prior work tasks (paperwork).  SLP to assist in developing a system to assist.  Began to occasion on communication strategies, as patient has difficulty comprehending verbal information.   08/05/22: Pt was seen for skilled ST services this date. SLP completed edu on cognitive-communication, purpose of speech therapy, and completed the CLQT.   Pt scored the following:  Attention: Mild Memory: Mild Executive functions: Mod Language: WNL Visuospatial skills: Mod  PATIENT EDUCATION: Education details: cog-comm Person educated: Patient and Spouse Education method: Theatre stage manager Education comprehension: verbalized understanding and needs further education     GOALS: Goals reviewed with patient? Yes   SHORT TERM GOALS: Target date: 08/29/22   Patient will increase auditory comprehension by recalling and verbalizing strategies to assist with active listening during conversation with min assist.  Baseline: Goal status: NOT MET   2.  Patient will comprehend functional memory or visual aids for recall of important information during structured conversations with min assist.  Baseline:  Goal status: NOT MET   3.  Pt will complete medication management task with spv.  Baseline:  Goal status: NOT MET       LONG TERM GOALS: Target date: 09/29/22   Patient will increase auditory comprehension by recalling and verbalizing strategies to assist with active listening during unstructured conversations independently.  Baseline:  Goal status: INITIAL   2.   Patient will demonstrate understanding and verbalize use of functional memory\visual  aids to recall important information and unstructured conversations independently  Baseline:  Goal status: INITIAL     ASSESSMENT:   CLINICAL IMPRESSION: Pt is a 82 yo male who presents to [place] for evaluation for cognition 2/2 to dementia diagnosis. see tx note. SLP rec skilled ST services to address cognitive communication impairment to maximize functional communication and independence.      OBJECTIVE IMPAIRMENTS: include attention, memory, awareness, and executive functioning. These impairments are limiting patient from ADLs/IADLs and effectively communicating at home and in community. Factors affecting potential to achieve goals and functional outcome are ability to learn/carryover information.. Patient will benefit from skilled SLP services to address above impairments and improve overall function.   REHAB POTENTIAL: Good   PLAN:   SLP FREQUENCY: 2x/week   SLP DURATION: 8 weeks  PLANNED INTERVENTIONS: Environmental controls, Cueing hierachy, Cognitive reorganization, Internal/external aids, Functional tasks, SLP instruction and feedback, and Compensatory strategies       Verdene Lennert, CCC-SLP 09/05/2022, 9:43 AM

## 2022-09-09 ENCOUNTER — Ambulatory Visit: Payer: Medicare HMO | Admitting: Speech Pathology

## 2022-09-09 ENCOUNTER — Encounter: Payer: Self-pay | Admitting: Speech Pathology

## 2022-09-09 DIAGNOSIS — R41841 Cognitive communication deficit: Secondary | ICD-10-CM

## 2022-09-11 NOTE — Therapy (Signed)
OUTPATIENT SPEECH LANGUAGE PATHOLOGY TREATMENT NOTE   Patient Name: Patrick Macias MRN: [id number] DOB:Sep 10, 1940, 82 y.o., male Today's Date: 09/11/2022  PCP: Crist Infante, MD REFERRING PROVIDER: Suzzanne Cloud NP  Speech Therapy Progress Note  Dates of Reporting Period: 07/31/22 to present  Subjective Statement: "I can't recall"  Objective: See previous tx notes.  Goal Update: Pt and wife are making improvement towards goals  Plan: Cont skilled ST services with focus on edu  Reason Skilled Services are Required: To maximize functional communication     END OF SESSION:   End of Session - 09/09/22 1126     Visit Number 10    Number of Visits 17    Date for SLP Re-Evaluation 09/30/22    SLP Start Time 0933    SLP Stop Time  [id number]    SLP Time Calculation (min) 42 min    Activity Tolerance Patient tolerated treatment well             Past Medical History:  Diagnosis Date   Anxiety    Aortic atherosclerosis (HCC)    BPH (benign prostatic hyperplasia)    Carotid atherosclerosis    Cerumen impaction    Coronary artery calcification    Dyslipidemia    Frequent PVCs    H/O lithotripsy    Hearing decreased, bilateral    Hx of colonic polyps    IFG (impaired fasting glucose)    Macrocytic anemia    Macrocytosis    Memory loss    MGUS (monoclonal gammopathy of unknown significance)    Nephrolithiasis    TIA (transient ischemic attack)    Vitamin D deficiency    Past Surgical History:  Procedure Laterality Date   BALLOON DILATION Bilateral 08/20/2022   Procedure: BALLOON DILATION;  Surgeon: Arta Silence, MD;  Location: [facility];  Service: Gastroenterology;  Laterality: Bilateral;   BIOPSY  08/20/2022   Procedure: BIOPSY;  Surgeon: Arta Silence, MD;  Location: [facility];  Service: Gastroenterology;;   BOTOX INJECTION N/A 08/20/2022   Procedure: BOTOX INJECTION;  Surgeon: Arta Silence, MD;  Location: [facility];  Service: Gastroenterology;   Laterality: N/A;  100 units   CATARACT EXTRACTION, BILATERAL     ESOPHAGEAL MANOMETRY N/A 04/16/2022   Procedure: ESOPHAGEAL MANOMETRY (EM);  Surgeon: Arta Silence, MD;  Location: [facility];  Service: Gastroenterology;  Laterality: N/A;   ESOPHAGOGASTRODUODENOSCOPY (EGD) WITH PROPOFOL Bilateral 08/20/2022   Procedure: ESOPHAGOGASTRODUODENOSCOPY (EGD) WITH PROPOFOL;  Surgeon: Arta Silence, MD;  Location: [facility];  Service: Gastroenterology;  Laterality: Bilateral;   PROSTATECTOMY     Partial   Patient Active Problem List   Diagnosis Date Noted   Macrocytic anemia 04/02/2021   MGUS (monoclonal gammopathy of unknown significance) 12/28/2020   Cerebral vascular disease 08/01/2020   SIRS (systemic inflammatory response syndrome) (HCC) 0000000   Metabolic encephalopathy 0000000   Cognitive impairment 06/04/2020   TIA (transient ischemic attack) 06/01/2020   Dementia without behavioral disturbance ([place]) 06/01/2020   Anxiety 06/01/2020   HTN (hypertension) 06/01/2020   Right shoulder pain 08/10/2013    ONSET DATE: 07/30/22  REFERRING DIAG:  [id number] (ICD-10-CM) - Dementia with behavioral disturbance (HCC)    THERAPY DIAG:  Cognitive communication deficit  Rationale for Evaluation and Treatment: Rehabilitation  SUBJECTIVE:   SUBJECTIVE STATEMENT: Pt has difficulty with time management as reported by wife - late today.  PAIN:  Are you having pain? No    OBJECTIVE:   TODAY'S TREATMENT:  DATE:   09/09/22: Patient was seen for skilled ST services with focus on cognitive skills. SLP provided pt and wife with edu on fishing groups for veterans. SLP provided handouts for wife to follow up. Continued edu on memory strategies. Pt continues to use calendars, but requires minA to navigate.   09/05/22: Patient was seen for skilled ST services  with focus on cognitive skills.  Patient and wife were late to treatment this date.  Wife reported that he had a communication breakdown in which she was telling patient therapy was at 77 and patient thought therapy was at 32.  SLP encouraged wife to reduce environmental distractions Re: turning off TV, so patient is able to focus fully on their conversation.  Patient reports frustration with reduced independence 2/2 to dementia.  SLP encouraged patient to do more things he enjoys re: fishing.  Reviewed importance of using repetition and clarification to decrease communication breakdowns.  To cont with edu on memory strategies next session.   09/02/22: Patient was seen for skilled ST services with focus on cognitive skills. Continued edu on memory strategies this session. Wife reports successful use of calendar and notebook (pt wife prompting for use). Pt has carried over, "I cannot recall" when confused/can't remember. SLP cont to reinforce and encourage pt to repeat information back to increase comprehension of auditory information. SLP used orthographic cue if pt had difficulty with hearing. To cont next session.   08/26/22: Patient was seen for skilled ST services with focus on cognitive skills. Provided wife and pt with printed pictures of remote, microwave, and phone. Pt was able to sequence using pictures and demonstrate use of remote and phone with minA verbal cues. SLP began edu on memory strategies this session. Pt is currently not using a calendar independently; however, SLP suggested using one to assist with appointments. Wife to use a "weekly" calendar and review with him every day. SLP also recommended pt get a notebook and use this to write down questions to increase recall of important information (I.e. questions for doc, SLP etc.)     08/22/22: Patient was seen for skilled ST services with focus on cognitive skills. Wife had provided SLP with pictures of items that pt had difficulty navigating  at home. SLP went through each picture with patient to identify where errors were being made, so SLP could make a guide to assist. Wife reports benefit from labeling items. SLP assisted pt with telephone - teaching "SIRI, call ____ (911)" in case of emergency. SLP updated pt's favorites to include only emergency contacts. Pt demonstrated carry over of "I cannot recall" at the end of session without any cueing when asked a detail. Pt reports he has been working on using that phrase. Cont with this next session (or begin memory strategies).   08/19/22: Patient was seen for skilled ST services with focus on communication skills. Wife provided examples of communication breakdown that occurred when driving to doctor's office. SLP provided solution of using GPS to decrease stress while driving for [name]. Continued edu on communication strategies. 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 [name] was provided with visual to take home reading the following: "I can't recall." And "Can you repeat that?" SLP continues to encourage pt repeating back information to increase comprehension. SLP encouraged use of labels in the house as pt wife reported difficulty finding items (in the fridge). To finish up communication strategies next session.   08/15/22: Patient  was seen for skilled ST services this date. Patient was accompanied by wife this session. Reviewed success with communication strategies at home. SLP clarified all questions wife had regarding information. Pt appeared "lost" during today's session. SLP encouraged pt to repeat back words heard to increase processing and comprehension of verbal information. SLP suspects this was helpful, as several times prior to this, pt was unable to recall any information. Wife and pt identified strategies they would like to work on more at home re: providing more processing time, not filling in pt's ideas/sentences, having  pt repeat back instructions. At times, pt nods and pretends he understands information, or looks to wife to fill in gaps. SLP provided visual for pt to refer to when he could not remember information ( I can't recall) and encouraged honesty vs. "Fake it til you make it". Will continue to address communication strategies next session.   08/08/22: Patient was seen for skilled ST services this date.  Patient was accompanied by wife this session.  SLP reviewed scores from assessment with wife.  Wife reports she feels patient functions higher than what the score suggests.  SLP explained that assessments are not functional in nature; therefore, they are able to show [place] how we are functioning outside of the treatment room.  Wife reports patient cannot perform the following tasks effectively or efficiently re: navigating phone, remote, microwave, stove.  SLP suggested wife take pictures of items so SLP is able to create visual cues.  Wife also reports patient has difficulty with organization of prior work tasks (paperwork).  SLP to assist in developing a system to assist.  Began to occasion on communication strategies, as patient has difficulty comprehending verbal information.   08/05/22: Pt was seen for skilled ST services this date. SLP completed edu on cognitive-communication, purpose of speech therapy, and completed the CLQT.   Pt scored the following:  Attention: Mild Memory: Mild Executive functions: Mod Language: WNL Visuospatial skills: Mod  PATIENT EDUCATION: Education details: cog-comm Person educated: Patient and Spouse Education method: Theatre stage manager Education comprehension: verbalized understanding and needs further education     GOALS: Goals reviewed with patient? Yes   SHORT TERM GOALS: Target date: 08/29/22   Patient will increase auditory comprehension by recalling and verbalizing strategies to assist with active listening during conversation with min assist.  Baseline: Goal  status: NOT MET   2.  Patient will comprehend functional memory or visual aids for recall of important information during structured conversations with min assist.  Baseline:  Goal status: NOT MET   3.  Pt will complete medication management task with spv.  Baseline:  Goal status: NOT MET       LONG TERM GOALS: Target date: 09/29/22   Patient will increase auditory comprehension by recalling and verbalizing strategies to assist with active listening during unstructured conversations independently.  Baseline:  Goal status: INITIAL   2.   Patient will demonstrate understanding and verbalize use of functional memory\visual aids to recall important information and unstructured conversations independently  Baseline:  Goal status: INITIAL     ASSESSMENT:   CLINICAL IMPRESSION: Pt is a 82 yo male who presents to [place] for evaluation for cognition 2/2 to dementia diagnosis. see tx note. SLP rec skilled ST services to address cognitive communication impairment to maximize functional communication and independence.      OBJECTIVE IMPAIRMENTS: include attention, memory, awareness, and executive functioning. These impairments are limiting patient from ADLs/IADLs and effectively communicating at home and in  community. Factors affecting potential to achieve goals and functional outcome are ability to learn/carryover information.. Patient will benefit from skilled SLP services to address above impairments and improve overall function.   REHAB POTENTIAL: Good   PLAN:   SLP FREQUENCY: 2x/week   SLP DURATION: 8 weeks   PLANNED INTERVENTIONS: Environmental controls, Cueing hierachy, Cognitive reorganization, Internal/external aids, Functional tasks, SLP instruction and feedback, and Compensatory strategies       [employer], CCC-SLP 09/11/2022, 11:26 AM

## 2022-09-12 ENCOUNTER — Ambulatory Visit: Payer: Medicare HMO | Admitting: Speech Pathology

## 2022-09-12 DIAGNOSIS — R41841 Cognitive communication deficit: Secondary | ICD-10-CM | POA: Diagnosis not present

## 2022-09-12 NOTE — Therapy (Signed)
OUTPATIENT SPEECH LANGUAGE PATHOLOGY TREATMENT NOTE   Patient Name: Patrick Macias MRN: [id number] DOB:17-Jul-1940, 82 y.o., male Today's Date: 09/12/2022  PCP: Crist Infante, MD REFERRING PROVIDER: Suzzanne Cloud NP  Speech Therapy Progress Note  Dates of Reporting Period: 07/31/22 to present  Subjective Statement: "I can't recall"  Objective: See previous tx notes.  Goal Update: Pt and wife are making improvement towards goals  Plan: Cont skilled ST services with focus on edu  Reason Skilled Services are Required: To maximize functional communication     END OF SESSION:   End of Session - 09/09/22 1126     Visit Number 10    Number of Visits 17    Date for SLP Re-Evaluation 09/30/22    SLP Start Time 0933    SLP Stop Time  [id number]    SLP Time Calculation (min) 42 min    Activity Tolerance Patient tolerated treatment well             Past Medical History:  Diagnosis Date   Anxiety    Aortic atherosclerosis (HCC)    BPH (benign prostatic hyperplasia)    Carotid atherosclerosis    Cerumen impaction    Coronary artery calcification    Dyslipidemia    Frequent PVCs    H/O lithotripsy    Hearing decreased, bilateral    Hx of colonic polyps    IFG (impaired fasting glucose)    Macrocytic anemia    Macrocytosis    Memory loss    MGUS (monoclonal gammopathy of unknown significance)    Nephrolithiasis    TIA (transient ischemic attack)    Vitamin D deficiency    Past Surgical History:  Procedure Laterality Date   BALLOON DILATION Bilateral 08/20/2022   Procedure: BALLOON DILATION;  Surgeon: Arta Silence, MD;  Location: [facility];  Service: Gastroenterology;  Laterality: Bilateral;   BIOPSY  08/20/2022   Procedure: BIOPSY;  Surgeon: Arta Silence, MD;  Location: [facility];  Service: Gastroenterology;;   BOTOX INJECTION N/A 08/20/2022   Procedure: BOTOX INJECTION;  Surgeon: Arta Silence, MD;  Location: [facility];  Service: Gastroenterology;   Laterality: N/A;  100 units   CATARACT EXTRACTION, BILATERAL     ESOPHAGEAL MANOMETRY N/A 04/16/2022   Procedure: ESOPHAGEAL MANOMETRY (EM);  Surgeon: Arta Silence, MD;  Location: [facility];  Service: Gastroenterology;  Laterality: N/A;   ESOPHAGOGASTRODUODENOSCOPY (EGD) WITH PROPOFOL Bilateral 08/20/2022   Procedure: ESOPHAGOGASTRODUODENOSCOPY (EGD) WITH PROPOFOL;  Surgeon: Arta Silence, MD;  Location: [facility];  Service: Gastroenterology;  Laterality: Bilateral;   PROSTATECTOMY     Partial   Patient Active Problem List   Diagnosis Date Noted   Macrocytic anemia 04/02/2021   MGUS (monoclonal gammopathy of unknown significance) 12/28/2020   Cerebral vascular disease 08/01/2020   SIRS (systemic inflammatory response syndrome) (HCC) 0000000   Metabolic encephalopathy 0000000   Cognitive impairment 06/04/2020   TIA (transient ischemic attack) 06/01/2020   Dementia without behavioral disturbance ([place]) 06/01/2020   Anxiety 06/01/2020   HTN (hypertension) 06/01/2020   Right shoulder pain 08/10/2013    ONSET DATE: 07/30/22  REFERRING DIAG:  [id number] (ICD-10-CM) - Dementia with behavioral disturbance (HCC)    THERAPY DIAG:  Cognitive communication deficit  Rationale for Evaluation and Treatment: Rehabilitation  SUBJECTIVE:   SUBJECTIVE STATEMENT: "I missed that last part."  PAIN:  Are you having pain? No    OBJECTIVE:   TODAY'S TREATMENT:  DATE:   09/12/22: Patient was seen for skilled ST services with focus on cognitive-communication skills.  Wife reports that patient had a stressful past 2 days and this has certainly impacted his thinking skills.  SLP provided education on the spoon theory.  SLP encouraged patient to repeat information back to increase consolidation of information.  Patient was noted to have more success in  repeating information back which increased his ability to provide appropriate responses to questions.  Patient completed organization HEP re: organizing items on grocery list into their corresponding category.  Patient required modA verbal and gestural cues to complete task.  SLP encouraged patient to check items off when reviewing his work to ensure items have not been missed.  SLP also encouraged patient to take brain breaks when feeling fatigued and "come back later".  Continue with strategies next session.  09/09/22: Patient was seen for skilled ST services with focus on cognitive skills. SLP provided pt and wife with edu on fishing groups for veterans. SLP provided handouts for wife to follow up. Continued edu on memory strategies. Pt continues to use calendars, but requires minA to navigate.   09/05/22: Patient was seen for skilled ST services with focus on cognitive skills.  Patient and wife were late to treatment this date.  Wife reported that he had a communication breakdown in which she was telling patient therapy was at 10 and patient thought therapy was at 59.  SLP encouraged wife to reduce environmental distractions Re: turning off TV, so patient is able to focus fully on their conversation.  Patient reports frustration with reduced independence 2/2 to dementia.  SLP encouraged patient to do more things he enjoys re: fishing.  Reviewed importance of using repetition and clarification to decrease communication breakdowns.  To cont with edu on memory strategies next session.   09/02/22: Patient was seen for skilled ST services with focus on cognitive skills. Continued edu on memory strategies this session. Wife reports successful use of calendar and notebook (pt wife prompting for use). Pt has carried over, "I cannot recall" when confused/can't remember. SLP cont to reinforce and encourage pt to repeat information back to increase comprehension of auditory information. SLP used orthographic cue if pt  had difficulty with hearing. To cont next session.   08/26/22: Patient was seen for skilled ST services with focus on cognitive skills. Provided wife and pt with printed pictures of remote, microwave, and phone. Pt was able to sequence using pictures and demonstrate use of remote and phone with minA verbal cues. SLP began edu on memory strategies this session. Pt is currently not using a calendar independently; however, SLP suggested using one to assist with appointments. Wife to use a "weekly" calendar and review with him every day. SLP also recommended pt get a notebook and use this to write down questions to increase recall of important information (I.e. questions for doc, SLP etc.)     08/22/22: Patient was seen for skilled ST services with focus on cognitive skills. Wife had provided SLP with pictures of items that pt had difficulty navigating at home. SLP went through each picture with patient to identify where errors were being made, so SLP could make a guide to assist. Wife reports benefit from labeling items. SLP assisted pt with telephone - teaching "SIRI, call ____ (911)" in case of emergency. SLP updated pt's favorites to include only emergency contacts. Pt demonstrated carry over of "I cannot recall" at the end of session without any cueing when asked a  detail. Pt reports he has been working on using that phrase. Cont with this next session (or begin memory strategies).   08/19/22: Patient was seen for skilled ST services with focus on communication skills. Wife provided examples of communication breakdown that occurred when driving to doctor's office. SLP provided solution of using GPS to decrease stress while driving for [name]. Continued edu on communication strategies. 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 [name] was provided with visual to take home reading the following: "I can't recall." And "Can you repeat that?" SLP  continues to encourage pt repeating back information to increase comprehension. SLP encouraged use of labels in the house as pt wife reported difficulty finding items (in the fridge). To finish up communication strategies next session.   08/15/22: Patient was seen for skilled ST services this date. Patient was accompanied by wife this session. Reviewed success with communication strategies at home. SLP clarified all questions wife had regarding information. Pt appeared "lost" during today's session. SLP encouraged pt to repeat back words heard to increase processing and comprehension of verbal information. SLP suspects this was helpful, as several times prior to this, pt was unable to recall any information. Wife and pt identified strategies they would like to work on more at home re: providing more processing time, not filling in pt's ideas/sentences, having pt repeat back instructions. At times, pt nods and pretends he understands information, or looks to wife to fill in gaps. SLP provided visual for pt to refer to when he could not remember information ( I can't recall) and encouraged honesty vs. "Fake it til you make it". Will continue to address communication strategies next session.   08/08/22: Patient was seen for skilled ST services this date.  Patient was accompanied by wife this session.  SLP reviewed scores from assessment with wife.  Wife reports she feels patient functions higher than what the score suggests.  SLP explained that assessments are not functional in nature; therefore, they are able to show [place] how we are functioning outside of the treatment room.  Wife reports patient cannot perform the following tasks effectively or efficiently re: navigating phone, remote, microwave, stove.  SLP suggested wife take pictures of items so SLP is able to create visual cues.  Wife also reports patient has difficulty with organization of prior work tasks (paperwork).  SLP to assist in developing a system to  assist.  Began to occasion on communication strategies, as patient has difficulty comprehending verbal information.   08/05/22: Pt was seen for skilled ST services this date. SLP completed edu on cognitive-communication, purpose of speech therapy, and completed the CLQT.   Pt scored the following:  Attention: Mild Memory: Mild Executive functions: Mod Language: WNL Visuospatial skills: Mod  PATIENT EDUCATION: Education details: cog-comm Person educated: Patient and Spouse Education method: Theatre stage manager Education comprehension: verbalized understanding and needs further education     GOALS: Goals reviewed with patient? Yes   SHORT TERM GOALS: Target date: 08/29/22   Patient will increase auditory comprehension by recalling and verbalizing strategies to assist with active listening during conversation with min assist.  Baseline: Goal status: NOT MET   2.  Patient will comprehend functional memory or visual aids for recall of important information during structured conversations with min assist.  Baseline:  Goal status: NOT MET   3.  Pt will complete medication management task with spv.  Baseline:  Goal status: NOT MET  LONG TERM GOALS: Target date: 09/29/22   Patient will increase auditory comprehension by recalling and verbalizing strategies to assist with active listening during unstructured conversations independently.  Baseline:  Goal status: INITIAL   2.   Patient will demonstrate understanding and verbalize use of functional memory\visual aids to recall important information and unstructured conversations independently  Baseline:  Goal status: INITIAL     ASSESSMENT:   CLINICAL IMPRESSION: Pt is a 82 yo male who presents to [place] for evaluation for cognition 2/2 to dementia diagnosis. see tx note. SLP rec skilled ST services to address cognitive communication impairment to maximize functional communication and independence.      OBJECTIVE  IMPAIRMENTS: include attention, memory, awareness, and executive functioning. These impairments are limiting patient from ADLs/IADLs and effectively communicating at home and in community. Factors affecting potential to achieve goals and functional outcome are ability to learn/carryover information.. Patient will benefit from skilled SLP services to address above impairments and improve overall function.   REHAB POTENTIAL: Good   PLAN:   SLP FREQUENCY: 2x/week   SLP DURATION: 8 weeks   PLANNED INTERVENTIONS: Environmental controls, Cueing hierachy, Cognitive reorganization, Internal/external aids, Functional tasks, SLP instruction and feedback, and Compensatory strategies       Verdene Lennert, CCC-SLP 09/12/2022, 10:43 AM

## 2022-09-16 ENCOUNTER — Ambulatory Visit: Payer: Medicare HMO | Admitting: Speech Pathology

## 2022-09-18 ENCOUNTER — Encounter: Payer: Self-pay | Admitting: Cardiovascular Disease

## 2022-09-18 ENCOUNTER — Encounter: Payer: Self-pay | Admitting: Speech Pathology

## 2022-09-18 ENCOUNTER — Ambulatory Visit: Payer: Medicare HMO | Attending: Cardiovascular Disease | Admitting: Cardiovascular Disease

## 2022-09-18 ENCOUNTER — Ambulatory Visit: Payer: Medicare HMO | Admitting: Speech Pathology

## 2022-09-18 VITALS — BP 108/60 | HR 56 | Ht 71.0 in | Wt 181.0 lb

## 2022-09-18 DIAGNOSIS — R41841 Cognitive communication deficit: Secondary | ICD-10-CM

## 2022-09-18 DIAGNOSIS — I493 Ventricular premature depolarization: Secondary | ICD-10-CM

## 2022-09-18 NOTE — Progress Notes (Signed)
Electrophysiology Office Note:    Date:  09/18/2022   ID:  Patrick Macias, DOB [birth year], MRN [id number]  PCP:  Crist Infante, [place] Providers Cardiologist:  Sinclair Grooms, MD (Inactive) Electrophysiologist:  Melida Quitter, MD     Referring MD: Crist Infante, MD   History of Present Illness:    Patrick Macias is a 82 y.o. male with a hx listed below, significant for vascular disease including elevated coronary calcium score, MGUS, macrocytic anemia referred for arrhythmia management.  He has a history of frequent PVCs.  During a clinic visit in October 2023, his heart rate registered 30-40 bpm due to frequent PVCs resulting in electro-apical dissociation.  At her last visit, we switched from metoprolol to propranolol.  He is not sure whether or not he started taking magnesium taurate.  He reports that he feels great.  He has no fatigue or palpitations.   He does have occasional weakness and lightheadedness.  No chest pain, palpitations, syncope, near syncope.   Past Medical History:  Diagnosis Date   Anxiety    Aortic atherosclerosis (HCC)    BPH (benign prostatic hyperplasia)    Carotid atherosclerosis    Cerumen impaction    Coronary artery calcification    Dyslipidemia    Frequent PVCs    H/O lithotripsy    Hearing decreased, bilateral    Hx of colonic polyps    IFG (impaired fasting glucose)    Macrocytic anemia    Macrocytosis    Memory loss    MGUS (monoclonal gammopathy of unknown significance)    Nephrolithiasis    TIA (transient ischemic attack)    Vitamin D deficiency     Past Surgical History:  Procedure Laterality Date   BALLOON DILATION Bilateral 08/20/2022   Procedure: BALLOON DILATION;  Surgeon: Arta Silence, MD;  Location: [facility];  Service: Gastroenterology;  Laterality: Bilateral;   BIOPSY  08/20/2022   Procedure: BIOPSY;  Surgeon: Arta Silence, MD;  Location: [facility];  Service: Gastroenterology;;    BOTOX INJECTION N/A 08/20/2022   Procedure: BOTOX INJECTION;  Surgeon: Arta Silence, MD;  Location: [facility];  Service: Gastroenterology;  Laterality: N/A;  100 units   CATARACT EXTRACTION, BILATERAL     ESOPHAGEAL MANOMETRY N/A 04/16/2022   Procedure: ESOPHAGEAL MANOMETRY (EM);  Surgeon: Arta Silence, MD;  Location: [facility];  Service: Gastroenterology;  Laterality: N/A;   ESOPHAGOGASTRODUODENOSCOPY (EGD) WITH PROPOFOL Bilateral 08/20/2022   Procedure: ESOPHAGOGASTRODUODENOSCOPY (EGD) WITH PROPOFOL;  Surgeon: Arta Silence, MD;  Location: [facility];  Service: Gastroenterology;  Laterality: Bilateral;   PROSTATECTOMY     Partial    Current Medications: Current Meds  Medication Sig   aspirin EC 81 MG tablet Take 1 tablet (81 mg total) by mouth daily.   donepezil (ARICEPT) 10 MG tablet Take 1 tablet (10 mg total) by mouth at bedtime.     Allergies:   Patient has no known allergies.   Social History   Socioeconomic History   Marital status: Married    Spouse name: [name]   Number of children: 2   Years of education: college   Highest education level: Bachelor's degree (e.g., BA, AB, BS)  Occupational History   Occupation: Insurance   Tobacco Use   Smoking status: Never   Smokeless tobacco: Never  Substance and Sexual Activity   Alcohol use: Not Currently   Drug use: Never   Sexual activity: Not on file  Other Topics Concern   Not  on file  Social History Narrative   Lives at home with his wife.   Left-handed.   Two cups caffeine per day.   Social Determinants of Health   Financial Resource Strain: Not on file  Food Insecurity: Not on file  Transportation Needs: Not on file  Physical Activity: Not on file  Stress: Not on file  Social Connections: Not on file     Family History: The patient's family history includes Breast cancer in his mother; Leukemia in his mother; Other in his father. There is no history of Sudden death, Hypertension,  Hyperlipidemia, Diabetes, or Heart attack.  ROS:   Please see the history of present illness.    All other systems reviewed and are negative.  EKGs/Labs/Other Studies Reviewed Today:     TTE: July 12, 2022 EF 50-55%  Monitor: 5 day zio patch 04/2022 Sinus rhythm 25.6% PVC burden, occasionally occurring in bigeminy. There were a few epsiodes of NSVT  Lexiscan stress test:    Lexiscan stress showed no EKG changes to suggest ischemia   Myoview scan showed normal perfusion   LVEF on gating calculated at 54% with normal wall motion  Visually, LVEF appears greater   Prior study available for comparison from 05/17/2019. no change  EKG:  Last EKG results: today - sinus bradycardia with PVCs Recent Labs: 03/18/2022: Magnesium 2.4; TSH 0.795 05/05/2022: ALT 34; BUN 19; Creatinine 0.97; Hemoglobin 13.4; Platelet Count 209; Potassium 4.2; Sodium 139     Physical Exam:    VS:  BP 108/60   Pulse (!) 56   Ht 5\' 11"  (1.803 m)   Wt 181 lb (82.1 kg)   SpO2 98%   BMI 25.24 kg/m     Wt Readings from Last 3 Encounters:  09/18/22 181 lb (82.1 kg)  08/20/22 175 lb (79.4 kg)  08/18/22 177 lb 6.4 oz (80.5 kg)     GEN:  Well nourished, well developed in no acute distress CARDIAC: RRR, no murmurs, rubs, gallops RESPIRATORY:  Normal work of breathing MUSCULOSKELETAL: no edema    ASSESSMENT & PLAN:    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 Possible symptoms of intermittent weakness and lightheadedness have resolved. No PVCs on ECG today. Continue propranolol 60.  NSVT: no intervention indicated        Medication Adjustments/Labs and Tests Ordered: Current medicines are reviewed at length with the patient today.  Concerns regarding medicines are outlined above.  No orders of the defined types were placed in this encounter.  No orders of the defined types were placed in this encounter.    Signed, Melida Quitter, MD  09/18/2022 3:04 PM    [place]

## 2022-09-18 NOTE — Therapy (Signed)
OUTPATIENT SPEECH LANGUAGE PATHOLOGY TREATMENT NOTE   Patient Name: Patrick Macias MRN: [id number] DOB:04-25-1941, 82 y.o., male Today's Date: 09/18/2022  PCP: Crist Infante, MD REFERRING PROVIDER: Suzzanne Cloud NP  Speech Therapy Progress Note  Dates of Reporting Period: 07/31/22 to present  Subjective Statement: "I can't recall"  Objective: See previous tx notes.  Goal Update: Pt and wife are making improvement towards goals  Plan: Cont skilled ST services with focus on edu  Reason Skilled Services are Required: To maximize functional communication     END OF SESSION:   End of Session - 09/09/22 1126     Visit Number 10    Number of Visits 17    Date for SLP Re-Evaluation 09/30/22    SLP Start Time 0933    SLP Stop Time  [id number]    SLP Time Calculation (min) 42 min    Activity Tolerance Patient tolerated treatment well             Past Medical History:  Diagnosis Date   Anxiety    Aortic atherosclerosis (HCC)    BPH (benign prostatic hyperplasia)    Carotid atherosclerosis    Cerumen impaction    Coronary artery calcification    Dyslipidemia    Frequent PVCs    H/O lithotripsy    Hearing decreased, bilateral    Hx of colonic polyps    IFG (impaired fasting glucose)    Macrocytic anemia    Macrocytosis    Memory loss    MGUS (monoclonal gammopathy of unknown significance)    Nephrolithiasis    TIA (transient ischemic attack)    Vitamin D deficiency    Past Surgical History:  Procedure Laterality Date   BALLOON DILATION Bilateral 08/20/2022   Procedure: BALLOON DILATION;  Surgeon: Arta Silence, MD;  Location: [facility];  Service: Gastroenterology;  Laterality: Bilateral;   BIOPSY  08/20/2022   Procedure: BIOPSY;  Surgeon: Arta Silence, MD;  Location: [facility];  Service: Gastroenterology;;   BOTOX INJECTION N/A 08/20/2022   Procedure: BOTOX INJECTION;  Surgeon: Arta Silence, MD;  Location: [facility];  Service: Gastroenterology;   Laterality: N/A;  100 units   CATARACT EXTRACTION, BILATERAL     ESOPHAGEAL MANOMETRY N/A 04/16/2022   Procedure: ESOPHAGEAL MANOMETRY (EM);  Surgeon: Arta Silence, MD;  Location: [facility];  Service: Gastroenterology;  Laterality: N/A;   ESOPHAGOGASTRODUODENOSCOPY (EGD) WITH PROPOFOL Bilateral 08/20/2022   Procedure: ESOPHAGOGASTRODUODENOSCOPY (EGD) WITH PROPOFOL;  Surgeon: Arta Silence, MD;  Location: [facility];  Service: Gastroenterology;  Laterality: Bilateral;   PROSTATECTOMY     Partial   Patient Active Problem List   Diagnosis Date Noted   Macrocytic anemia 04/02/2021   MGUS (monoclonal gammopathy of unknown significance) 12/28/2020   Cerebral vascular disease 08/01/2020   SIRS (systemic inflammatory response syndrome) (HCC) 0000000   Metabolic encephalopathy 0000000   Cognitive impairment 06/04/2020   TIA (transient ischemic attack) 06/01/2020   Dementia without behavioral disturbance ([place]) 06/01/2020   Anxiety 06/01/2020   HTN (hypertension) 06/01/2020   Right shoulder pain 08/10/2013    ONSET DATE: 07/30/22  REFERRING DIAG:  [id number] (ICD-10-CM) - Dementia with behavioral disturbance (HCC)    THERAPY DIAG:  Cognitive communication deficit  Rationale for Evaluation and Treatment: Rehabilitation  SUBJECTIVE:   SUBJECTIVE STATEMENT: "I missed that last part."  PAIN:  Are you having pain? No    OBJECTIVE:   TODAY'S TREATMENT:  DATE:    09/18/22: Patient was seen for skilled ST services with focus on cognitive-communication skills. SLP briefly reviewed external strategies discussed in previous sessions. Began edu on internal strategies re: repetition, clarification, rehearsal, and chunking. Pt reported that he believes repetition and clarification to be most useful to him. SLP in agreement. Will cont next session.    09/12/22: Patient was seen for skilled ST services with focus on cognitive-communication skills.  Wife reports that patient had a stressful past 2 days and this has certainly impacted his thinking skills.  SLP provided education on the spoon theory.  SLP encouraged patient to repeat information back to increase consolidation of information.  Patient was noted to have more success in repeating information back which increased his ability to provide appropriate responses to questions.  Patient completed organization HEP re: organizing items on grocery list into their corresponding category.  Patient required modA verbal and gestural cues to complete task.  SLP encouraged patient to check items off when reviewing his work to ensure items have not been missed.  SLP also encouraged patient to take brain breaks when feeling fatigued and "come back later".  Continue with strategies next session.  09/09/22: Patient was seen for skilled ST services with focus on cognitive skills. SLP provided pt and wife with edu on fishing groups for veterans. SLP provided handouts for wife to follow up. Continued edu on memory strategies. Pt continues to use calendars, but requires minA to navigate.   09/05/22: Patient was seen for skilled ST services with focus on cognitive skills.  Patient and wife were late to treatment this date.  Wife reported that he had a communication breakdown in which she was telling patient therapy was at 35 and patient thought therapy was at 51.  SLP encouraged wife to reduce environmental distractions Re: turning off TV, so patient is able to focus fully on their conversation.  Patient reports frustration with reduced independence 2/2 to dementia.  SLP encouraged patient to do more things he enjoys re: fishing.  Reviewed importance of using repetition and clarification to decrease communication breakdowns.  To cont with edu on memory strategies next session.   09/02/22: Patient was seen for skilled ST  services with focus on cognitive skills. Continued edu on memory strategies this session. Wife reports successful use of calendar and notebook (pt wife prompting for use). Pt has carried over, "I cannot recall" when confused/can't remember. SLP cont to reinforce and encourage pt to repeat information back to increase comprehension of auditory information. SLP used orthographic cue if pt had difficulty with hearing. To cont next session.   08/26/22: Patient was seen for skilled ST services with focus on cognitive skills. Provided wife and pt with printed pictures of remote, microwave, and phone. Pt was able to sequence using pictures and demonstrate use of remote and phone with minA verbal cues. SLP began edu on memory strategies this session. Pt is currently not using a calendar independently; however, SLP suggested using one to assist with appointments. Wife to use a "weekly" calendar and review with him every day. SLP also recommended pt get a notebook and use this to write down questions to increase recall of important information (I.e. questions for doc, SLP etc.)     08/22/22: Patient was seen for skilled ST services with focus on cognitive skills. Wife had provided SLP with pictures of items that pt had difficulty navigating at home. SLP went through each picture with patient to identify where errors were being made,  so SLP could make a guide to assist. Wife reports benefit from labeling items. SLP assisted pt with telephone - teaching "SIRI, call ____ (911)" in case of emergency. SLP updated pt's favorites to include only emergency contacts. Pt demonstrated carry over of "I cannot recall" at the end of session without any cueing when asked a detail. Pt reports he has been working on using that phrase. Cont with this next session (or begin memory strategies).   08/19/22: Patient was seen for skilled ST services with focus on communication skills. Wife provided examples of communication breakdown that  occurred when driving to doctor's office. SLP provided solution of using GPS to decrease stress while driving for [name]. Continued edu on communication strategies. 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 [name] was provided with visual to take home reading the following: "I can't recall." And "Can you repeat that?" SLP continues to encourage pt repeating back information to increase comprehension. SLP encouraged use of labels in the house as pt wife reported difficulty finding items (in the fridge). To finish up communication strategies next session.   08/15/22: Patient was seen for skilled ST services this date. Patient was accompanied by wife this session. Reviewed success with communication strategies at home. SLP clarified all questions wife had regarding information. Pt appeared "lost" during today's session. SLP encouraged pt to repeat back words heard to increase processing and comprehension of verbal information. SLP suspects this was helpful, as several times prior to this, pt was unable to recall any information. Wife and pt identified strategies they would like to work on more at home re: providing more processing time, not filling in pt's ideas/sentences, having pt repeat back instructions. At times, pt nods and pretends he understands information, or looks to wife to fill in gaps. SLP provided visual for pt to refer to when he could not remember information ( I can't recall) and encouraged honesty vs. "Fake it til you make it". Will continue to address communication strategies next session.   08/08/22: Patient was seen for skilled ST services this date.  Patient was accompanied by wife this session.  SLP reviewed scores from assessment with wife.  Wife reports she feels patient functions higher than what the score suggests.  SLP explained that assessments are not functional in nature; therefore, they are able to show [place] how we are  functioning outside of the treatment room.  Wife reports patient cannot perform the following tasks effectively or efficiently re: navigating phone, remote, microwave, stove.  SLP suggested wife take pictures of items so SLP is able to create visual cues.  Wife also reports patient has difficulty with organization of prior work tasks (paperwork).  SLP to assist in developing a system to assist.  Began to occasion on communication strategies, as patient has difficulty comprehending verbal information.   08/05/22: Pt was seen for skilled ST services this date. SLP completed edu on cognitive-communication, purpose of speech therapy, and completed the CLQT.   Pt scored the following:  Attention: Mild Memory: Mild Executive functions: Mod Language: WNL Visuospatial skills: Mod  PATIENT EDUCATION: Education details: cog-comm Person educated: Patient and Spouse Education method: Theatre stage manager Education comprehension: verbalized understanding and needs further education     GOALS: Goals reviewed with patient? Yes   SHORT TERM GOALS: Target date: 08/29/22   Patient will increase auditory comprehension by recalling and verbalizing strategies to assist with active listening during conversation with min  assist.  Baseline: Goal status: NOT MET   2.  Patient will comprehend functional memory or visual aids for recall of important information during structured conversations with min assist.  Baseline:  Goal status: NOT MET   3.  Pt will complete medication management task with spv.  Baseline:  Goal status: NOT MET       LONG TERM GOALS: Target date: 09/29/22   Patient will increase auditory comprehension by recalling and verbalizing strategies to assist with active listening during unstructured conversations independently.  Baseline:  Goal status: INITIAL   2.   Patient will demonstrate understanding and verbalize use of functional memory\visual aids to recall important information  and unstructured conversations independently  Baseline:  Goal status: INITIAL     ASSESSMENT:   CLINICAL IMPRESSION: Pt is a 82 yo male who presents to [facility] for evaluation for cognition 2/2 to dementia diagnosis. see tx note. SLP rec skilled ST services to address cognitive communication impairment to maximize functional communication and independence.      OBJECTIVE IMPAIRMENTS: include attention, memory, awareness, and executive functioning. These impairments are limiting patient from ADLs/IADLs and effectively communicating at home and in community. Factors affecting potential to achieve goals and functional outcome are ability to learn/carryover information.. Patient will benefit from skilled SLP services to address above impairments and improve overall function.   REHAB POTENTIAL: Good   PLAN:   SLP FREQUENCY: 2x/week   SLP DURATION: 8 weeks   PLANNED INTERVENTIONS: Environmental controls, Cueing hierachy, Cognitive reorganization, Internal/external aids, Functional tasks, SLP instruction and feedback, and Compensatory strategies       Verdene Lennert, CCC-SLP 09/18/2022, 12:54 PM

## 2022-09-18 NOTE — Patient Instructions (Signed)
Medication Instructions:  Your physician recommends that you continue on your current medications as directed. Please refer to the Current Medication list given to you today.  *If you need a refill on your cardiac medications before your next appointment, please call your pharmacy*  Lab Work: None ordered  Testing/Procedures: None ordered  Follow-Up: At [facility], you and your health needs are our priority.  As part of our continuing mission to provide you with exceptional heart care, we have created designated Provider Care Teams.  These Care Teams include your primary Cardiologist (physician) and Advanced Practice Providers (APPs -  Physician Assistants and Nurse Practitioners) who all work together to provide you with the care you need, when you need it.  Your next appointment:   1 year(s)  The format for your next appointment:   In Person  Provider:   You may see Augustus E Mealor, MD or one of the following Advanced Practice Providers on your designated Care Team:   Renee Ursuy, PA-C Michael "Andy" Tillery, PA-C Suzann Riddle, NP{  

## 2022-09-23 ENCOUNTER — Ambulatory Visit: Payer: Medicare HMO | Admitting: Speech Pathology

## 2022-09-23 DIAGNOSIS — R41841 Cognitive communication deficit: Secondary | ICD-10-CM

## 2022-09-24 ENCOUNTER — Encounter: Payer: Self-pay | Admitting: Speech Pathology

## 2022-09-24 NOTE — Therapy (Signed)
OUTPATIENT SPEECH LANGUAGE PATHOLOGY TREATMENT & DISCHARGE NOTE   Patient Name: Patrick Macias MRN: [id number] DOB:[birth year], 82 y.o., male Today's Date: 09/24/2022  PCP: Crist Infante, MD REFERRING PROVIDER: Suzzanne Cloud NP  SPEECH THERAPY DISCHARGE SUMMARY  Visits from Start of Care: 13  Current functional level related to goals / functional outcomes: Pt made progress towards all goals with help from wife. Pt is currently using a calendar, notebook, and auditory comprehension strategies to improve function.   Remaining deficits: Memory, awareness, comprehension   Education / Equipment: Completed   Patient agrees to discharge. Patient goals were met. Patient is being discharged due to the patient's request..- pt and wife are requesting a break from therapy after this most recent cycle. To return in a few months.     END OF SESSION:   End of Session - 09/09/22 1126     Visit Number 10    Number of Visits 17    Date for SLP Re-Evaluation 09/30/22    SLP Start Time 0933    SLP Stop Time  [id number]    SLP Time Calculation (min) 42 min    Activity Tolerance Patient tolerated treatment well             Past Medical History:  Diagnosis Date   Anxiety    Aortic atherosclerosis (HCC)    BPH (benign prostatic hyperplasia)    Carotid atherosclerosis    Cerumen impaction    Coronary artery calcification    Dyslipidemia    Frequent PVCs    H/O lithotripsy    Hearing decreased, bilateral    Hx of colonic polyps    IFG (impaired fasting glucose)    Macrocytic anemia    Macrocytosis    Memory loss    MGUS (monoclonal gammopathy of unknown significance)    Nephrolithiasis    TIA (transient ischemic attack)    Vitamin D deficiency    Past Surgical History:  Procedure Laterality Date   BALLOON DILATION Bilateral 08/20/2022   Procedure: BALLOON DILATION;  Surgeon: Arta Silence, MD;  Location: [facility];  Service: Gastroenterology;  Laterality: Bilateral;   BIOPSY   08/20/2022   Procedure: BIOPSY;  Surgeon: Arta Silence, MD;  Location: [facility];  Service: Gastroenterology;;   BOTOX INJECTION N/A 08/20/2022   Procedure: BOTOX INJECTION;  Surgeon: Arta Silence, MD;  Location: [facility];  Service: Gastroenterology;  Laterality: N/A;  100 units   CATARACT EXTRACTION, BILATERAL     ESOPHAGEAL MANOMETRY N/A 04/16/2022   Procedure: ESOPHAGEAL MANOMETRY (EM);  Surgeon: Arta Silence, MD;  Location: [facility];  Service: Gastroenterology;  Laterality: N/A;   ESOPHAGOGASTRODUODENOSCOPY (EGD) WITH PROPOFOL Bilateral 08/20/2022   Procedure: ESOPHAGOGASTRODUODENOSCOPY (EGD) WITH PROPOFOL;  Surgeon: Arta Silence, MD;  Location: [facility];  Service: Gastroenterology;  Laterality: Bilateral;   PROSTATECTOMY     Partial   Patient Active Problem List   Diagnosis Date Noted   Macrocytic anemia 04/02/2021   MGUS (monoclonal gammopathy of unknown significance) 12/28/2020   Cerebral vascular disease 08/01/2020   SIRS (systemic inflammatory response syndrome) (HCC) 0000000   Metabolic encephalopathy 0000000   Cognitive impairment 06/04/2020   TIA (transient ischemic attack) 06/01/2020   Dementia without behavioral disturbance ([place]) 06/01/2020   Anxiety 06/01/2020   HTN (hypertension) 06/01/2020   Right shoulder pain 08/10/2013    ONSET DATE: 07/30/22  REFERRING DIAG:  [id number] (ICD-10-CM) - Dementia with behavioral disturbance (HCC)    THERAPY DIAG:  Cognitive communication deficit  Rationale for Evaluation and Treatment: Rehabilitation  SUBJECTIVE:   SUBJECTIVE STATEMENT: "I don't agree."  PAIN:  Are you having pain? No    OBJECTIVE:   TODAY'S TREATMENT:                                                                                                                                         DATE:   09/23/22: Patient was seen for skilled ST services with focus on cognitive-communication skills. Wife brought up point of contention  between the two of them regarding him maneuvering the boat and fishing alone. SLP assisted pt and wife in navigating the conversation and provided pt with opportunities to express himself. Also provided cues to aid and ensure comprehension of the reason "why" family does not feel comfortable with him fishing alone. Completed review of memory strategies. To discharge today.   09/18/22: Patient was seen for skilled ST services with focus on cognitive-communication skills. SLP briefly reviewed external strategies discussed in previous sessions. Began edu on internal strategies re: repetition, clarification, rehearsal, and chunking. Pt reported that he believes repetition and clarification to be most useful to him. SLP in agreement. Will cont next session.   09/12/22: Patient was seen for skilled ST services with focus on cognitive-communication skills.  Wife reports that patient had a stressful past 2 days and this has certainly impacted his thinking skills.  SLP provided education on the spoon theory.  SLP encouraged patient to repeat information back to increase consolidation of information.  Patient was noted to have more success in repeating information back which increased his ability to provide appropriate responses to questions.  Patient completed organization HEP re: organizing items on grocery list into their corresponding category.  Patient required modA verbal and gestural cues to complete task.  SLP encouraged patient to check items off when reviewing his work to ensure items have not been missed.  SLP also encouraged patient to take brain breaks when feeling fatigued and "come back later".  Continue with strategies next session.  09/09/22: Patient was seen for skilled ST services with focus on cognitive skills. SLP provided pt and wife with edu on fishing groups for veterans. SLP provided handouts for wife to follow up. Continued edu on memory strategies. Pt continues to use calendars, but requires minA  to navigate.   09/05/22: Patient was seen for skilled ST services with focus on cognitive skills.  Patient and wife were late to treatment this date.  Wife reported that he had a communication breakdown in which she was telling patient therapy was at 76 and patient thought therapy was at 62.  SLP encouraged wife to reduce environmental distractions Re: turning off TV, so patient is able to focus fully on their conversation.  Patient reports frustration with reduced independence 2/2 to dementia.  SLP encouraged patient to do more things he enjoys re: fishing.  Reviewed importance of using repetition and clarification to decrease communication breakdowns.  To cont with edu on memory strategies next session.   09/02/22: Patient was seen for skilled ST services with focus on cognitive skills. Continued edu on memory strategies this session. Wife reports successful use of calendar and notebook (pt wife prompting for use). Pt has carried over, "I cannot recall" when confused/can't remember. SLP cont to reinforce and encourage pt to repeat information back to increase comprehension of auditory information. SLP used orthographic cue if pt had difficulty with hearing. To cont next session.   08/26/22: Patient was seen for skilled ST services with focus on cognitive skills. Provided wife and pt with printed pictures of remote, microwave, and phone. Pt was able to sequence using pictures and demonstrate use of remote and phone with minA verbal cues. SLP began edu on memory strategies this session. Pt is currently not using a calendar independently; however, SLP suggested using one to assist with appointments. Wife to use a "weekly" calendar and review with him every day. SLP also recommended pt get a notebook and use this to write down questions to increase recall of important information (I.e. questions for doc, SLP etc.)     08/22/22: Patient was seen for skilled ST services with focus on cognitive skills. Wife had  provided SLP with pictures of items that pt had difficulty navigating at home. SLP went through each picture with patient to identify where errors were being made, so SLP could make a guide to assist. Wife reports benefit from labeling items. SLP assisted pt with telephone - teaching "SIRI, call ____ (911)" in case of emergency. SLP updated pt's favorites to include only emergency contacts. Pt demonstrated carry over of "I cannot recall" at the end of session without any cueing when asked a detail. Pt reports he has been working on using that phrase. Cont with this next session (or begin memory strategies).   08/19/22: Patient was seen for skilled ST services with focus on communication skills. Wife provided examples of communication breakdown that occurred when driving to doctor's office. SLP provided solution of using GPS to decrease stress while driving for [name]. Continued edu on communication strategies. 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 [name] was provided with visual to take home reading the following: "I can't recall." And "Can you repeat that?" SLP continues to encourage pt repeating back information to increase comprehension. SLP encouraged use of labels in the house as pt wife reported difficulty finding items (in the fridge). To finish up communication strategies next session.   08/15/22: Patient was seen for skilled ST services this date. Patient was accompanied by wife this session. Reviewed success with communication strategies at home. SLP clarified all questions wife had regarding information. Pt appeared "lost" during today's session. SLP encouraged pt to repeat back words heard to increase processing and comprehension of verbal information. SLP suspects this was helpful, as several times prior to this, pt was unable to recall any information. Wife and pt identified strategies they would like to work on more at home re:  providing more processing time, not filling in pt's ideas/sentences, having pt repeat back instructions. At times, pt nods and pretends he understands information, or looks to wife to fill in gaps. SLP provided visual for pt to refer to when he could not remember information ( I can't recall) and encouraged honesty vs. "Fake it til you make it". Will continue to address communication strategies next session.   08/08/22: Patient was seen for  skilled ST services this date.  Patient was accompanied by wife this session.  SLP reviewed scores from assessment with wife.  Wife reports she feels patient functions higher than what the score suggests.  SLP explained that assessments are not functional in nature; therefore, they are able to show [place] how we are functioning outside of the treatment room.  Wife reports patient cannot perform the following tasks effectively or efficiently re: navigating phone, remote, microwave, stove.  SLP suggested wife take pictures of items so SLP is able to create visual cues.  Wife also reports patient has difficulty with organization of prior work tasks (paperwork).  SLP to assist in developing a system to assist.  Began to occasion on communication strategies, as patient has difficulty comprehending verbal information.   08/05/22: Pt was seen for skilled ST services this date. SLP completed edu on cognitive-communication, purpose of speech therapy, and completed the CLQT.   Pt scored the following:  Attention: Mild Memory: Mild Executive functions: Mod Language: WNL Visuospatial skills: Mod  PATIENT EDUCATION: Education details: cog-comm Person educated: Patient and Spouse Education method: Theatre stage manager Education comprehension: verbalized understanding and needs further education     GOALS: Goals reviewed with patient? Yes   SHORT TERM GOALS: Target date: 08/29/22   Patient will increase auditory comprehension by recalling and verbalizing strategies to assist  with active listening during conversation with min assist.  Baseline: Goal status: NOT MET   2.  Patient will comprehend functional memory or visual aids for recall of important information during structured conversations with min assist.  Baseline:  Goal status: NOT MET   3.  Pt will complete medication management task with spv.  Baseline:  Goal status: NOT MET       LONG TERM GOALS: Target date: 09/29/22   Patient will increase auditory comprehension by recalling and verbalizing strategies to assist with active listening during unstructured conversations independently.  Baseline:  Goal status: MET   2.   Patient will demonstrate understanding and verbalize use of functional memory\visual aids to recall important information and unstructured conversations independently  Baseline:  Goal status: MET     ASSESSMENT:   CLINICAL IMPRESSION: Pt is a 82 yo male who presents to [place] for evaluation for cognition 2/2 to dementia diagnosis. see tx note. To discharge from therapy services this date at request of pt and family.       OBJECTIVE IMPAIRMENTS: include attention, memory, awareness, and executive functioning. These impairments are limiting patient from ADLs/IADLs and effectively communicating at home and in community. Factors affecting potential to achieve goals and functional outcome are ability to learn/carryover information.. Patient will benefit from skilled SLP services to address above impairments and improve overall function.   REHAB POTENTIAL: Good   PLAN:   SLP FREQUENCY: 2x/week   SLP DURATION: 8 weeks   PLANNED INTERVENTIONS: Environmental controls, Cueing hierachy, Cognitive reorganization, Internal/external aids, Functional tasks, SLP instruction and feedback, and Compensatory strategies       [employer], CCC-SLP 09/24/2022, 9:15 AM

## 2022-10-07 DIAGNOSIS — N401 Enlarged prostate with lower urinary tract symptoms: Secondary | ICD-10-CM | POA: Diagnosis not present

## 2022-10-07 DIAGNOSIS — N138 Other obstructive and reflux uropathy: Secondary | ICD-10-CM | POA: Diagnosis not present

## 2022-10-07 DIAGNOSIS — N200 Calculus of kidney: Secondary | ICD-10-CM | POA: Diagnosis not present

## 2022-10-07 DIAGNOSIS — R972 Elevated prostate specific antigen [PSA]: Secondary | ICD-10-CM | POA: Diagnosis not present

## 2022-10-20 DIAGNOSIS — N3289 Other specified disorders of bladder: Secondary | ICD-10-CM | POA: Diagnosis not present

## 2022-10-20 DIAGNOSIS — M545 Low back pain, unspecified: Secondary | ICD-10-CM | POA: Diagnosis not present

## 2022-10-20 DIAGNOSIS — R972 Elevated prostate specific antigen [PSA]: Secondary | ICD-10-CM | POA: Diagnosis not present

## 2022-10-20 DIAGNOSIS — N400 Enlarged prostate without lower urinary tract symptoms: Secondary | ICD-10-CM | POA: Diagnosis not present

## 2022-10-30 DIAGNOSIS — M545 Low back pain, unspecified: Secondary | ICD-10-CM | POA: Diagnosis not present

## 2022-10-30 DIAGNOSIS — G8929 Other chronic pain: Secondary | ICD-10-CM | POA: Diagnosis not present

## 2022-11-11 ENCOUNTER — Ambulatory Visit: Payer: Medicare HMO | Attending: Family Medicine

## 2022-11-11 DIAGNOSIS — R279 Unspecified lack of coordination: Secondary | ICD-10-CM | POA: Diagnosis not present

## 2022-11-11 DIAGNOSIS — R2689 Other abnormalities of gait and mobility: Secondary | ICD-10-CM | POA: Diagnosis not present

## 2022-11-11 DIAGNOSIS — M5459 Other low back pain: Secondary | ICD-10-CM | POA: Insufficient documentation

## 2022-11-11 DIAGNOSIS — R41841 Cognitive communication deficit: Secondary | ICD-10-CM | POA: Diagnosis not present

## 2022-11-11 DIAGNOSIS — M6281 Muscle weakness (generalized): Secondary | ICD-10-CM | POA: Diagnosis not present

## 2022-11-11 NOTE — Therapy (Signed)
OUTPATIENT PHYSICAL THERAPY THORACOLUMBAR EVALUATION   Patient Name: Patrick Macias MRN: [id number] DOB:1941/02/17, 82 y.o., male Today's Date: 11/11/2022  END OF SESSION:  PT End of Session - 11/11/22 1231     Visit Number 1    Date for PT Re-Evaluation 01/20/23    Authorization Type [employer]    PT Start Time 1230    PT Stop Time 1315    PT Time Calculation (min) 45 min    Activity Tolerance Patient tolerated treatment well             Past Medical History:  Diagnosis Date   Anxiety    Aortic atherosclerosis (HCC)    BPH (benign prostatic hyperplasia)    Carotid atherosclerosis    Cerumen impaction    Coronary artery calcification    Dyslipidemia    Frequent PVCs    H/O lithotripsy    Hearing decreased, bilateral    Hx of colonic polyps    IFG (impaired fasting glucose)    Macrocytic anemia    Macrocytosis    Memory loss    MGUS (monoclonal gammopathy of unknown significance)    Nephrolithiasis    TIA (transient ischemic attack)    Vitamin D deficiency    Past Surgical History:  Procedure Laterality Date   BALLOON DILATION Bilateral 08/20/2022   Procedure: BALLOON DILATION;  Surgeon: Willis Modena, MD;  Location: [facility];  Service: Gastroenterology;  Laterality: Bilateral;   BIOPSY  08/20/2022   Procedure: BIOPSY;  Surgeon: Willis Modena, MD;  Location: [facility];  Service: Gastroenterology;;   BOTOX INJECTION N/A 08/20/2022   Procedure: BOTOX INJECTION;  Surgeon: Willis Modena, MD;  Location: [facility];  Service: Gastroenterology;  Laterality: N/A;  100 units   CATARACT EXTRACTION, BILATERAL     ESOPHAGEAL MANOMETRY N/A 04/16/2022   Procedure: ESOPHAGEAL MANOMETRY (EM);  Surgeon: Willis Modena, MD;  Location: [facility];  Service: Gastroenterology;  Laterality: N/A;   ESOPHAGOGASTRODUODENOSCOPY (EGD) WITH PROPOFOL Bilateral 08/20/2022   Procedure: ESOPHAGOGASTRODUODENOSCOPY (EGD) WITH PROPOFOL;  Surgeon: Willis Modena, MD;   Location: [facility];  Service: Gastroenterology;  Laterality: Bilateral;   PROSTATECTOMY     Partial   Patient Active Problem List   Diagnosis Date Noted   Macrocytic anemia 04/02/2021   MGUS (monoclonal gammopathy of unknown significance) 12/28/2020   Cerebral vascular disease 08/01/2020   SIRS (systemic inflammatory response syndrome) (HCC) 06/30/2020   Metabolic encephalopathy 06/30/2020   Cognitive impairment 06/04/2020   TIA (transient ischemic attack) 06/01/2020   Dementia without behavioral disturbance (HCC) 06/01/2020   Anxiety 06/01/2020   HTN (hypertension) 06/01/2020   Right shoulder pain 08/10/2013    PCP: Rodrigo Ran  REFERRING PROVIDER: Norman Herrlich  REFERRING DIAG:  M54.50 (ICD-10-CM) - Low back pain, unspecified  G89.29 (ICD-10-CM) - Other chronic pain    Rationale for Evaluation and Treatment: Rehabilitation  THERAPY DIAG:  Other low back pain  Cognitive communication deficit  Other abnormalities of gait and mobility  Unspecified lack of coordination  Muscle weakness (generalized)  ONSET DATE: 11/03/22  SUBJECTIVE:  SUBJECTIVE STATEMENT: I would say probably six months ago, I would say it about a 1 or 2 right now. I have some issues with memory and get frustrated.   PERTINENT HISTORY:  Dementia, anxiety, PTSD  PAIN:  Are you having pain? Yes: NPRS scale: 2/10 Pain location: low back Pain description: achy Aggravating factors: nothing really, walking a lot Relieving factors: pain meds once a day  PRECAUTIONS: None  WEIGHT BEARING RESTRICTIONS: No  FALLS:  Has patient fallen in last 6 months? 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   SCREENING FOR RED FLAGS: Bowel or bladder incontinence: No Spinal tumors: No Cauda equina syndrome: No Compression fracture: No Abdominal aneurysm: No  COGNITION: Overall cognitive status: Within functional limits for tasks assessed and Impaired     SENSATION: WFL  MUSCLE LENGTH: Hamstrings: mod tightness bilaterally  POSTURE: rounded shoulders and forward head  PALPATION: No TTP or pain   LUMBAR ROM: no pain  AROM eval  Flexion 75%   Extension 50%  Right lateral flexion Fib head  Left lateral flexion Fib head  Right rotation 75%  Left rotation 75%   (Blank rows = not tested)  LOWER EXTREMITY ROM:  grossly WNL  LOWER EXTREMITY MMT:  5/5   LUMBAR SPECIAL TESTS:  Straight leg raise test: Positive and FABER test: Negative  FUNCTIONAL TESTS:  5 times sit to stand: 12.63s   TODAY'S TREATMENT:                                                                                                                              DATE: 11/11/22- EVAL    PATIENT EDUCATION:  Education details: POC and HEP Person educated: Patient Education method: Explanation Education comprehension: verbalized understanding  HOME EXERCISE PROGRAM: Access Code: [email or web address] URL: [email or web address] Date: 11/11/2022 Prepared by: Cassie Freer  Exercises - Supine Bridge  - 1 x daily - 7 x weekly - 2 sets - 10 reps - Supine Lower Trunk Rotation  - 1 x daily - 7 x weekly - 2 sets - 10 reps - Seated Hamstring Stretch  - 1 x daily - 7 x weekly - 2 sets - 30 hold  ASSESSMENT:  CLINICAL IMPRESSION: Patient is a 82 y.o. male who was seen today for physical therapy evaluation and treatment for low back pain. He does have dementia and  some issues with processing at times. He also gets frustrated at times when speaking or having to follow directions. He does stagger slightly with gait especially if changing positions. Did not want to give him too many exercises,  as he had a hard time following the few that we went over today. Will require lots of verbal and tactile cueing throughout sessions. Patient presents with good mobility and strength, has some slight pain that he would like to work out. Will plan for 1x/wk to make it easy for him to follow POC.    REHAB POTENTIAL: Good  CLINICAL DECISION MAKING: Stable/uncomplicated  EVALUATION COMPLEXITY: Low   GOALS: Goals reviewed with patient? Yes  SHORT TERM GOALS: Target date: 12/16/22  Patient will be independent with initial HEP.  Goal status: INITIAL   LONG TERM GOALS: Target date: 01/20/23  Patient will be independent with advanced/ongoing HEP to improve outcomes and carryover.  Goal status: INITIAL  2.  Patient will report 75% improvement in low back pain to improve QOL.  Baseline: 2/10 Goal status: INITIAL  3.  Patient will demonstrate full pain free lumbar ROM to perform ADLs.   Baseline: see chart above Goal status: INITIAL  4.  Patient to demonstrate ability to achieve and maintain good spinal alignment/posturing and body mechanics needed for daily activities. Goal status: INITIAL   PLAN:  PT FREQUENCY: 1-2x/week  PT DURATION: 10 weeks  PLANNED INTERVENTIONS: Therapeutic exercises, Therapeutic activity, Neuromuscular re-education, Balance training, Gait training, Patient/Family education, Self Care, Joint mobilization, Stair training, Dry Needling, Cryotherapy, Moist heat, Traction, and Manual therapy.  PLAN FOR NEXT SESSION: gym activities to work on low back and core strengthening, stretch!    [place], PT 11/11/2022, 1:17 PM

## 2022-11-19 ENCOUNTER — Encounter: Payer: Self-pay | Admitting: Physical Therapy

## 2022-11-19 ENCOUNTER — Ambulatory Visit: Payer: Medicare HMO | Admitting: Physical Therapy

## 2022-11-19 DIAGNOSIS — R41841 Cognitive communication deficit: Secondary | ICD-10-CM | POA: Diagnosis not present

## 2022-11-19 DIAGNOSIS — M5459 Other low back pain: Secondary | ICD-10-CM

## 2022-11-19 DIAGNOSIS — M6281 Muscle weakness (generalized): Secondary | ICD-10-CM | POA: Diagnosis not present

## 2022-11-19 DIAGNOSIS — R279 Unspecified lack of coordination: Secondary | ICD-10-CM | POA: Diagnosis not present

## 2022-11-19 DIAGNOSIS — R2689 Other abnormalities of gait and mobility: Secondary | ICD-10-CM | POA: Diagnosis not present

## 2022-11-19 NOTE — Therapy (Signed)
OUTPATIENT PHYSICAL THERAPY THORACOLUMBAR TREATMENT   Patient Name: Patrick Macias MRN: [id number] DOB:06-18-1941, 82 y.o., male Today's Date: 11/19/2022  END OF SESSION:  PT End of Session - 11/19/22 0848     Visit Number 2    Date for PT Re-Evaluation 01/20/23    PT Start Time 0848    PT Stop Time 0930    PT Time Calculation (min) 42 min    Activity Tolerance Patient tolerated treatment well    Behavior During Therapy WFL for tasks assessed/performed             Past Medical History:  Diagnosis Date   Anxiety    Aortic atherosclerosis (HCC)    BPH (benign prostatic hyperplasia)    Carotid atherosclerosis    Cerumen impaction    Coronary artery calcification    Dyslipidemia    Frequent PVCs    H/O lithotripsy    Hearing decreased, bilateral    Hx of colonic polyps    IFG (impaired fasting glucose)    Macrocytic anemia    Macrocytosis    Memory loss    MGUS (monoclonal gammopathy of unknown significance)    Nephrolithiasis    TIA (transient ischemic attack)    Vitamin D deficiency    Past Surgical History:  Procedure Laterality Date   BALLOON DILATION Bilateral 08/20/2022   Procedure: BALLOON DILATION;  Surgeon: Willis Modena, MD;  Location: [facility];  Service: Gastroenterology;  Laterality: Bilateral;   BIOPSY  08/20/2022   Procedure: BIOPSY;  Surgeon: Willis Modena, MD;  Location: [facility];  Service: Gastroenterology;;   BOTOX INJECTION N/A 08/20/2022   Procedure: BOTOX INJECTION;  Surgeon: Willis Modena, MD;  Location: [facility];  Service: Gastroenterology;  Laterality: N/A;  100 units   CATARACT EXTRACTION, BILATERAL     ESOPHAGEAL MANOMETRY N/A 04/16/2022   Procedure: ESOPHAGEAL MANOMETRY (EM);  Surgeon: Willis Modena, MD;  Location: [facility];  Service: Gastroenterology;  Laterality: N/A;   ESOPHAGOGASTRODUODENOSCOPY (EGD) WITH PROPOFOL Bilateral 08/20/2022   Procedure: ESOPHAGOGASTRODUODENOSCOPY (EGD) WITH PROPOFOL;  Surgeon: Willis Modena, MD;  Location: [facility];  Service: Gastroenterology;  Laterality: Bilateral;   PROSTATECTOMY     Partial   Patient Active Problem List   Diagnosis Date Noted   Macrocytic anemia 04/02/2021   MGUS (monoclonal gammopathy of unknown significance) 12/28/2020   Cerebral vascular disease 08/01/2020   SIRS (systemic inflammatory response syndrome) (HCC) 06/30/2020   Metabolic encephalopathy 06/30/2020   Cognitive impairment 06/04/2020   TIA (transient ischemic attack) 06/01/2020   Dementia without behavioral disturbance (HCC) 06/01/2020   Anxiety 06/01/2020   HTN (hypertension) 06/01/2020   Right shoulder pain 08/10/2013    PCP: Rodrigo Ran  REFERRING PROVIDER: Norman Herrlich  REFERRING DIAG:  M54.50 (ICD-10-CM) - Low back pain, unspecified  G89.29 (ICD-10-CM) - Other chronic pain    Rationale for Evaluation and Treatment: Rehabilitation  THERAPY DIAG:  Other low back pain  Muscle weakness (generalized)  ONSET DATE: 11/03/22  SUBJECTIVE:  SUBJECTIVE STATEMENT: "Doing all right I guess" Pain when he got up then it got worst  PERTINENT HISTORY:  Dementia, anxiety, PTSD  PAIN:  Are you having pain? Yes: NPRS scale: 2/10 Pain location: low back Pain description: achy Aggravating factors: nothing really, walking a lot Relieving factors: pain meds once a day  PRECAUTIONS: None  WEIGHT BEARING RESTRICTIONS: No  FALLS:  Has patient fallen in last 6 months? No  LIVING ENVIRONMENT: Lives with: lives with their spouse Lives in: House/apartment Stairs: Yes: External: 3 steps; can reach both Has following equipment at home: Single point cane and Walker - 2 wheeled  OCCUPATION: retired  PLOF: Independent  PATIENT GOALS: to make my back quit hurting   NEXT MD VISIT:  12/11/22  OBJECTIVE:   DIAGNOSTIC FINDINGS:  Per patient he has a curve in lower lumbar spine   SCREENING FOR RED FLAGS: Bowel or bladder incontinence: No Spinal tumors: No Cauda equina syndrome: No Compression fracture: No Abdominal aneurysm: No  COGNITION: Overall cognitive status: Within functional limits for tasks assessed and Impaired     SENSATION: WFL  MUSCLE LENGTH: Hamstrings: mod tightness bilaterally  POSTURE: rounded shoulders and forward head  PALPATION: No TTP or pain   LUMBAR ROM: no pain  AROM eval  Flexion 75%   Extension 50%  Right lateral flexion Fib head  Left lateral flexion Fib head  Right rotation 75%  Left rotation 75%   (Blank rows = not tested)  LOWER EXTREMITY ROM:  grossly WNL  LOWER EXTREMITY MMT:  5/5   LUMBAR SPECIAL TESTS:  Straight leg raise test: Positive and FABER test: Negative  FUNCTIONAL TESTS:  5 times sit to stand: 12.63s   TODAY'S TREATMENT:                                                                                                                              DATE:  11/19/22 NuStep L 5 x 6 min  S2S OHP yellow ball 2x10 Shoulder Ext 10lb 2x10  HS curls 25lb 2x10  Leg Ext 10lb 2x10 Bridges x10 LE stretching HS, piriformis, single K2C, lower trunk rotation 11/11/22- EVAL    PATIENT EDUCATION:  Education details: POC and HEP Person educated: Patient Education method: Explanation Education comprehension: verbalized understanding  HOME EXERCISE PROGRAM: Access Code: [email or web address] URL: [email or web address] Date: 11/11/2022 Prepared by: Cassie Freer  Exercises - Supine Bridge  - 1 x daily - 7 x weekly - 2 sets - 10 reps - Supine Lower Trunk Rotation  - 1 x daily - 7 x weekly - 2 sets - 10 reps - Seated Hamstring Stretch  - 1 x daily - 7 x weekly - 2 sets - 30 hold  ASSESSMENT:  CLINICAL IMPRESSION: Patient is a 82 y.o. male who was seen today for physical therapy evaluation and treatment  for low back pain. He does have dementia and some issues with processing at times. He also gets frustrated at times when  speaking or having to follow directions. Pt  lots of verbal and tactile cueing throughout sessions. Pt had to mimic therapist with completing sit to stands OHP.  Postural weakness noted with shoulder extensions. Bilateral HS tightness with passive stretching.  REHAB POTENTIAL: Good  CLINICAL DECISION MAKING: Stable/uncomplicated  EVALUATION COMPLEXITY: Low   GOALS: Goals reviewed with patient? Yes  SHORT TERM GOALS: Target date: 12/16/22  Patient will be independent with initial HEP.  Goal status: INITIAL   LONG TERM GOALS: Target date: 01/20/23  Patient will be independent with advanced/ongoing HEP to improve outcomes and carryover.  Goal status: INITIAL  2.  Patient will report 75% improvement in low back pain to improve QOL.  Baseline: 2/10 Goal status: INITIAL  3.  Patient will demonstrate full pain free lumbar ROM to perform ADLs.   Baseline: see chart above Goal status: INITIAL  4.  Patient to demonstrate ability to achieve and maintain good spinal alignment/posturing and body mechanics needed for daily activities. Goal status: INITIAL   PLAN:  PT FREQUENCY: 1-2x/week  PT DURATION: 10 weeks  PLANNED INTERVENTIONS: Therapeutic exercises, Therapeutic activity, Neuromuscular re-education, Balance training, Gait training, Patient/Family education, Self Care, Joint mobilization, Stair training, Dry Needling, Cryotherapy, Moist heat, Traction, and Manual therapy.  PLAN FOR NEXT SESSION: gym activities to work on low back and core strengthening, stretch!    Grayce Sessions, PTA 11/19/2022, 8:48 AM

## 2022-11-25 NOTE — Therapy (Signed)
OUTPATIENT PHYSICAL THERAPY THORACOLUMBAR TREATMENT   Patient Name: Patrick Macias MRN: [id number] DOB:11-03-1940, 82 y.o., male Today's Date: 11/26/2022  END OF SESSION:  PT End of Session - 11/26/22 0801     Visit Number 3    Date for PT Re-Evaluation 01/20/23    PT Start Time 0800    PT Stop Time 0845    PT Time Calculation (min) 45 min    Activity Tolerance Patient tolerated treatment well    Behavior During Therapy WFL for tasks assessed/performed             Past Medical History:  Diagnosis Date   Anxiety    Aortic atherosclerosis (HCC)    BPH (benign prostatic hyperplasia)    Carotid atherosclerosis    Cerumen impaction    Coronary artery calcification    Dyslipidemia    Frequent PVCs    H/O lithotripsy    Hearing decreased, bilateral    Hx of colonic polyps    IFG (impaired fasting glucose)    Macrocytic anemia    Macrocytosis    Memory loss    MGUS (monoclonal gammopathy of unknown significance)    Nephrolithiasis    TIA (transient ischemic attack)    Vitamin D deficiency    Past Surgical History:  Procedure Laterality Date   BALLOON DILATION Bilateral 08/20/2022   Procedure: BALLOON DILATION;  Surgeon: Willis Modena, MD;  Location: [facility];  Service: Gastroenterology;  Laterality: Bilateral;   BIOPSY  08/20/2022   Procedure: BIOPSY;  Surgeon: Willis Modena, MD;  Location: [facility];  Service: Gastroenterology;;   BOTOX INJECTION N/A 08/20/2022   Procedure: BOTOX INJECTION;  Surgeon: Willis Modena, MD;  Location: [facility];  Service: Gastroenterology;  Laterality: N/A;  100 units   CATARACT EXTRACTION, BILATERAL     ESOPHAGEAL MANOMETRY N/A 04/16/2022   Procedure: ESOPHAGEAL MANOMETRY (EM);  Surgeon: Willis Modena, MD;  Location: [facility];  Service: Gastroenterology;  Laterality: N/A;   ESOPHAGOGASTRODUODENOSCOPY (EGD) WITH PROPOFOL Bilateral 08/20/2022   Procedure: ESOPHAGOGASTRODUODENOSCOPY (EGD) WITH PROPOFOL;  Surgeon: Willis Modena, MD;  Location: [facility];  Service: Gastroenterology;  Laterality: Bilateral;   PROSTATECTOMY     Partial   Patient Active Problem List   Diagnosis Date Noted   Macrocytic anemia 04/02/2021   MGUS (monoclonal gammopathy of unknown significance) 12/28/2020   Cerebral vascular disease 08/01/2020   SIRS (systemic inflammatory response syndrome) (HCC) 06/30/2020   Metabolic encephalopathy 06/30/2020   Cognitive impairment 06/04/2020   TIA (transient ischemic attack) 06/01/2020   Dementia without behavioral disturbance (HCC) 06/01/2020   Anxiety 06/01/2020   HTN (hypertension) 06/01/2020   Right shoulder pain 08/10/2013    PCP: Rodrigo Ran  REFERRING PROVIDER: Norman Herrlich  REFERRING DIAG:  M54.50 (ICD-10-CM) - Low back pain, unspecified  G89.29 (ICD-10-CM) - Other chronic pain    Rationale for Evaluation and Treatment: Rehabilitation  THERAPY DIAG:  Other low back pain  Muscle weakness (generalized)  Cognitive communication deficit  Other abnormalities of gait and mobility  ONSET DATE: 11/03/22  SUBJECTIVE:  SUBJECTIVE STATEMENT: Doing alright. Not having any pain right now.   PERTINENT HISTORY:  Dementia, anxiety, PTSD  PAIN:  Are you having pain? Yes: NPRS scale: 0/10 Pain location: low back Pain description: achy Aggravating factors: nothing really, walking a lot Relieving factors: pain meds once a day  PRECAUTIONS: None  WEIGHT BEARING RESTRICTIONS: No  FALLS:  Has patient fallen in last 6 months? No  LIVING ENVIRONMENT: Lives with: lives with their spouse Lives in: House/apartment Stairs: Yes: External: 3 steps; can reach both Has following equipment at home: Single point cane and Walker - 2 wheeled  OCCUPATION: retired  PLOF: Independent  PATIENT  GOALS: to make my back quit hurting   NEXT MD VISIT: 12/11/22  OBJECTIVE:   DIAGNOSTIC FINDINGS:  Per patient he has a curve in lower lumbar spine   SCREENING FOR RED FLAGS: Bowel or bladder incontinence: No Spinal tumors: No Cauda equina syndrome: No Compression fracture: No Abdominal aneurysm: No  COGNITION: Overall cognitive status: Within functional limits for tasks assessed and Impaired     SENSATION: WFL  MUSCLE LENGTH: Hamstrings: mod tightness bilaterally  POSTURE: rounded shoulders and forward head  PALPATION: No TTP or pain   LUMBAR ROM: no pain  AROM eval  Flexion 75%   Extension 50%  Right lateral flexion Fib head  Left lateral flexion Fib head  Right rotation 75%  Left rotation 75%   (Blank rows = not tested)  LOWER EXTREMITY ROM:  grossly WNL  LOWER EXTREMITY MMT:  5/5   LUMBAR SPECIAL TESTS:  Straight leg raise test: Positive and FABER test: Negative  FUNCTIONAL TESTS:  5 times sit to stand: 12.63s   TODAY'S TREATMENT:                                                                                                                              DATE:  11/26/22 NuStep L5 x46mins  Stretching HS, piriformis, SK2C, trunk rotations  Bridges 2x10  Rows and lats 25# 2x10 Leg press 20# 2x10 Calf stretch 30s  Calf raises 2x10  STS with chest press yellow ball 2x10   11/19/22 NuStep L 5 x 6 min  S2S OHP yellow ball 2x10 Shoulder Ext 10lb 2x10  HS curls 25lb 2x10  Leg Ext 10lb 2x10 Bridges x10 LE stretching HS, piriformis, single K2C, lower trunk rotation  11/11/22- EVAL    PATIENT EDUCATION:  Education details: POC and HEP Person educated: Patient Education method: Explanation Education comprehension: verbalized understanding  HOME EXERCISE PROGRAM: Access Code: [email or web address] URL: [email or web address] Date: 11/11/2022 Prepared by: Cassie Freer  Exercises - Supine Bridge  - 1 x daily - 7 x weekly - 2 sets - 10 reps -  Supine Lower Trunk Rotation  - 1 x daily - 7 x weekly - 2 sets - 10 reps - Seated Hamstring Stretch  - 1 x daily - 7 x weekly - 2 sets - 30 hold  ASSESSMENT:  CLINICAL IMPRESSION: Patient  reports no back pain at beginning on visit. He does have dementia and some issues with processing at times. Pt requires lots of verbal and tactile cueing throughout sessions.  PT has to demonstrate most of the interventions to patient.   REHAB POTENTIAL: Good  CLINICAL DECISION MAKING: Stable/uncomplicated  EVALUATION COMPLEXITY: Low   GOALS: Goals reviewed with patient? Yes  SHORT TERM GOALS: Target date: 12/16/22  Patient will be independent with initial HEP.  Goal status: INITIAL   LONG TERM GOALS: Target date: 01/20/23  Patient will be independent with advanced/ongoing HEP to improve outcomes and carryover.  Goal status: INITIAL  2.  Patient will report 75% improvement in low back pain to improve QOL.  Baseline: 2/10 Goal status: INITIAL  3.  Patient will demonstrate full pain free lumbar ROM to perform ADLs.   Baseline: see chart above Goal status: INITIAL  4.  Patient to demonstrate ability to achieve and maintain good spinal alignment/posturing and body mechanics needed for daily activities. Goal status: INITIAL   PLAN:  PT FREQUENCY: 1-2x/week  PT DURATION: 10 weeks  PLANNED INTERVENTIONS: Therapeutic exercises, Therapeutic activity, Neuromuscular re-education, Balance training, Gait training, Patient/Family education, Self Care, Joint mobilization, Stair training, Dry Needling, Cryotherapy, Moist heat, Traction, and Manual therapy.  PLAN FOR NEXT SESSION: gym activities to work on low back and core strengthening, stretch!    [place], PT 11/26/2022, 8:42 AM

## 2022-11-26 ENCOUNTER — Ambulatory Visit: Payer: Medicare HMO

## 2022-11-26 DIAGNOSIS — M5459 Other low back pain: Secondary | ICD-10-CM | POA: Diagnosis not present

## 2022-11-26 DIAGNOSIS — R41841 Cognitive communication deficit: Secondary | ICD-10-CM | POA: Diagnosis not present

## 2022-11-26 DIAGNOSIS — R2689 Other abnormalities of gait and mobility: Secondary | ICD-10-CM

## 2022-11-26 DIAGNOSIS — M6281 Muscle weakness (generalized): Secondary | ICD-10-CM

## 2022-11-26 DIAGNOSIS — R279 Unspecified lack of coordination: Secondary | ICD-10-CM | POA: Diagnosis not present

## 2022-12-02 NOTE — Therapy (Signed)
OUTPATIENT PHYSICAL THERAPY THORACOLUMBAR TREATMENT   Patient Name: Patrick Macias MRN: [id number] DOB:02-02-1941, 82 y.o., male Today's Date: 11/26/2022  END OF SESSION:  PT End of Session - 11/26/22 0801     Visit Number 3    Date for PT Re-Evaluation 01/20/23    PT Start Time 0800    PT Stop Time 0845    PT Time Calculation (min) 45 min    Activity Tolerance Patient tolerated treatment well    Behavior During Therapy WFL for tasks assessed/performed             Past Medical History:  Diagnosis Date   Anxiety    Aortic atherosclerosis (HCC)    BPH (benign prostatic hyperplasia)    Carotid atherosclerosis    Cerumen impaction    Coronary artery calcification    Dyslipidemia    Frequent PVCs    H/O lithotripsy    Hearing decreased, bilateral    Hx of colonic polyps    IFG (impaired fasting glucose)    Macrocytic anemia    Macrocytosis    Memory loss    MGUS (monoclonal gammopathy of unknown significance)    Nephrolithiasis    TIA (transient ischemic attack)    Vitamin D deficiency    Past Surgical History:  Procedure Laterality Date   BALLOON DILATION Bilateral 08/20/2022   Procedure: BALLOON DILATION;  Surgeon: Willis Modena, MD;  Location: [facility];  Service: Gastroenterology;  Laterality: Bilateral;   BIOPSY  08/20/2022   Procedure: BIOPSY;  Surgeon: Willis Modena, MD;  Location: [facility];  Service: Gastroenterology;;   BOTOX INJECTION N/A 08/20/2022   Procedure: BOTOX INJECTION;  Surgeon: Willis Modena, MD;  Location: [facility];  Service: Gastroenterology;  Laterality: N/A;  100 units   CATARACT EXTRACTION, BILATERAL     ESOPHAGEAL MANOMETRY N/A 04/16/2022   Procedure: ESOPHAGEAL MANOMETRY (EM);  Surgeon: Willis Modena, MD;  Location: [facility];  Service: Gastroenterology;  Laterality: N/A;   ESOPHAGOGASTRODUODENOSCOPY (EGD) WITH PROPOFOL Bilateral 08/20/2022   Procedure: ESOPHAGOGASTRODUODENOSCOPY (EGD) WITH PROPOFOL;  Surgeon: Willis Modena, MD;  Location: [facility];  Service: Gastroenterology;  Laterality: Bilateral;   PROSTATECTOMY     Partial   Patient Active Problem List   Diagnosis Date Noted   Macrocytic anemia 04/02/2021   MGUS (monoclonal gammopathy of unknown significance) 12/28/2020   Cerebral vascular disease 08/01/2020   SIRS (systemic inflammatory response syndrome) (HCC) 06/30/2020   Metabolic encephalopathy 06/30/2020   Cognitive impairment 06/04/2020   TIA (transient ischemic attack) 06/01/2020   Dementia without behavioral disturbance (HCC) 06/01/2020   Anxiety 06/01/2020   HTN (hypertension) 06/01/2020   Right shoulder pain 08/10/2013    PCP: Rodrigo Ran  REFERRING PROVIDER: Norman Herrlich  REFERRING DIAG:  M54.50 (ICD-10-CM) - Low back pain, unspecified  G89.29 (ICD-10-CM) - Other chronic pain    Rationale for Evaluation and Treatment: Rehabilitation  THERAPY DIAG:  Other low back pain  Muscle weakness (generalized)  Cognitive communication deficit  Other abnormalities of gait and mobility  ONSET DATE: 11/03/22  SUBJECTIVE:  SUBJECTIVE STATEMENT: My back is feeling better, thanks to you. Whatever you are doing is helping.   PERTINENT HISTORY:  Dementia, anxiety, PTSD  PAIN:  Are you having pain? Yes: NPRS scale: 0/10 Pain location: low back Pain description: achy Aggravating factors: nothing really, walking a lot Relieving factors: pain meds once a day  PRECAUTIONS: None  WEIGHT BEARING RESTRICTIONS: No  FALLS:  Has patient fallen in last 6 months? No  LIVING ENVIRONMENT: Lives with: lives with their spouse Lives in: House/apartment Stairs: Yes: External: 3 steps; can reach both Has following equipment at home: Single point cane and Walker - 2 wheeled  OCCUPATION:  retired  PLOF: Independent  PATIENT GOALS: to make my back quit hurting   NEXT MD VISIT: 12/11/22  OBJECTIVE:   DIAGNOSTIC FINDINGS:  Per patient he has a curve in lower lumbar spine   SCREENING FOR RED FLAGS: Bowel or bladder incontinence: No Spinal tumors: No Cauda equina syndrome: No Compression fracture: No Abdominal aneurysm: No  COGNITION: Overall cognitive status: Within functional limits for tasks assessed and Impaired     SENSATION: WFL  MUSCLE LENGTH: Hamstrings: mod tightness bilaterally  POSTURE: rounded shoulders and forward head  PALPATION: No TTP or pain   LUMBAR ROM: no pain  AROM eval  Flexion 75%   Extension 50%  Right lateral flexion Fib head  Left lateral flexion Fib head  Right rotation 75%  Left rotation 75%   (Blank rows = not tested)  LOWER EXTREMITY ROM:  grossly WNL  LOWER EXTREMITY MMT:  5/5   LUMBAR SPECIAL TESTS:  Straight leg raise test: Positive and FABER test: Negative  FUNCTIONAL TESTS:  5 times sit to stand: 12.63s   TODAY'S TREATMENT:                                                                                                                              DATE:  12/03/22 Walking outdoors  Shoulder ext 10# 2x10 Leg ext 10# 2x10 HS curls 25# 2x10 NuStep L5 x23mins  Calf stretch 30s  Rows and Lats 25# 2x10 Seated HS stretch 30s    11/26/22 NuStep L5 x47mins  Stretching HS, piriformis, SK2C, trunk rotations  Bridges 2x10  Rows and lats 25# 2x10 Leg press 20# 2x10 Calf stretch 30s  Calf raises 2x10  STS with chest press yellow ball 2x10   11/19/22 NuStep L 5 x 6 min  S2S OHP yellow ball 2x10 Shoulder Ext 10lb 2x10  HS curls 25lb 2x10  Leg Ext 10lb 2x10 Bridges x10 LE stretching HS, piriformis, single K2C, lower trunk rotation  11/11/22- EVAL    PATIENT EDUCATION:  Education details: POC and HEP Person educated: Patient Education method: Explanation Education comprehension: verbalized  understanding  HOME EXERCISE PROGRAM: Access Code: [email or web address] URL: [email or web address] Date: 11/11/2022 Prepared by: Cassie Freer  Exercises - Supine Bridge  - 1 x daily - 7 x weekly - 2 sets - 10 reps - Supine Lower  Trunk Rotation  - 1 x daily - 7 x weekly - 2 sets - 10 reps - Seated Hamstring Stretch  - 1 x daily - 7 x weekly - 2 sets - 30 hold  ASSESSMENT:  CLINICAL IMPRESSION: Patient reports his back is feeling better and he does not have pain. He does have dementia and some issues with processing at times. Pt requires lots of verbal and tactile cueing throughout sessions. Unable to understand step up sequencing even after a lot of cueing so we had to stop.   REHAB POTENTIAL: Good  CLINICAL DECISION MAKING: Stable/uncomplicated  EVALUATION COMPLEXITY: Low   GOALS: Goals reviewed with patient? Yes  SHORT TERM GOALS: Target date: 12/16/22  Patient will be independent with initial HEP.  Goal status: INITIAL   LONG TERM GOALS: Target date: 01/20/23  Patient will be independent with advanced/ongoing HEP to improve outcomes and carryover.  Goal status: INITIAL  2.  Patient will report 75% improvement in low back pain to improve QOL.  Baseline: 2/10 Goal status: INITIAL  3.  Patient will demonstrate full pain free lumbar ROM to perform ADLs.   Baseline: see chart above Goal status: INITIAL  4.  Patient to demonstrate ability to achieve and maintain good spinal alignment/posturing and body mechanics needed for daily activities. Goal status: INITIAL   PLAN:  PT FREQUENCY: 1-2x/week  PT DURATION: 10 weeks  PLANNED INTERVENTIONS: Therapeutic exercises, Therapeutic activity, Neuromuscular re-education, Balance training, Gait training, Patient/Family education, Self Care, Joint mobilization, Stair training, Dry Needling, Cryotherapy, Moist heat, Traction, and Manual therapy.  PLAN FOR NEXT SESSION: gym activities to work on low back and core  strengthening, stretch!    [place], PT 11/26/2022, 8:42 AM

## 2022-12-03 ENCOUNTER — Ambulatory Visit: Payer: Medicare HMO | Attending: Family Medicine

## 2022-12-03 DIAGNOSIS — M6281 Muscle weakness (generalized): Secondary | ICD-10-CM | POA: Diagnosis not present

## 2022-12-03 DIAGNOSIS — R41841 Cognitive communication deficit: Secondary | ICD-10-CM

## 2022-12-03 DIAGNOSIS — M5459 Other low back pain: Secondary | ICD-10-CM

## 2022-12-03 DIAGNOSIS — R2689 Other abnormalities of gait and mobility: Secondary | ICD-10-CM | POA: Diagnosis not present

## 2022-12-09 NOTE — Therapy (Signed)
OUTPATIENT PHYSICAL THERAPY THORACOLUMBAR TREATMENT   Patient Name: Patrick Macias MRN: [id number] DOB:[birth year], 82 y.o., male Today's Date: 12/10/2022  END OF SESSION:  PT End of Session - 12/10/22 0847     Visit Number 5    Date for PT Re-Evaluation 01/20/23    PT Start Time 0845    PT Stop Time 0930    PT Time Calculation (min) 45 min    Activity Tolerance Patient tolerated treatment well    Behavior During Therapy WFL for tasks assessed/performed             Past Medical History:  Diagnosis Date   Anxiety    Aortic atherosclerosis (HCC)    BPH (benign prostatic hyperplasia)    Carotid atherosclerosis    Cerumen impaction    Coronary artery calcification    Dyslipidemia    Frequent PVCs    H/O lithotripsy    Hearing decreased, bilateral    Hx of colonic polyps    IFG (impaired fasting glucose)    Macrocytic anemia    Macrocytosis    Memory loss    MGUS (monoclonal gammopathy of unknown significance)    Nephrolithiasis    TIA (transient ischemic attack)    Vitamin D deficiency    Past Surgical History:  Procedure Laterality Date   BALLOON DILATION Bilateral 08/20/2022   Procedure: BALLOON DILATION;  Surgeon: Willis Modena, MD;  Location: [facility];  Service: Gastroenterology;  Laterality: Bilateral;   BIOPSY  08/20/2022   Procedure: BIOPSY;  Surgeon: Willis Modena, MD;  Location: [facility];  Service: Gastroenterology;;   BOTOX INJECTION N/A 08/20/2022   Procedure: BOTOX INJECTION;  Surgeon: Willis Modena, MD;  Location: [facility];  Service: Gastroenterology;  Laterality: N/A;  100 units   CATARACT EXTRACTION, BILATERAL     ESOPHAGEAL MANOMETRY N/A 04/16/2022   Procedure: ESOPHAGEAL MANOMETRY (EM);  Surgeon: Willis Modena, MD;  Location: [facility];  Service: Gastroenterology;  Laterality: N/A;   ESOPHAGOGASTRODUODENOSCOPY (EGD) WITH PROPOFOL Bilateral 08/20/2022   Procedure: ESOPHAGOGASTRODUODENOSCOPY (EGD) WITH PROPOFOL;  Surgeon: Willis Modena, MD;  Location: [facility];  Service: Gastroenterology;  Laterality: Bilateral;   PROSTATECTOMY     Partial   Patient Active Problem List   Diagnosis Date Noted   Macrocytic anemia 04/02/2021   MGUS (monoclonal gammopathy of unknown significance) 12/28/2020   Cerebral vascular disease 08/01/2020   SIRS (systemic inflammatory response syndrome) (HCC) 06/30/2020   Metabolic encephalopathy 06/30/2020   Cognitive impairment 06/04/2020   TIA (transient ischemic attack) 06/01/2020   Dementia without behavioral disturbance (HCC) 06/01/2020   Anxiety 06/01/2020   HTN (hypertension) 06/01/2020   Right shoulder pain 08/10/2013    PCP: Rodrigo Ran  REFERRING PROVIDER: Norman Herrlich  REFERRING DIAG:  M54.50 (ICD-10-CM) - Low back pain, unspecified  G89.29 (ICD-10-CM) - Other chronic pain    Rationale for Evaluation and Treatment: Rehabilitation  THERAPY DIAG:  Other low back pain  Muscle weakness (generalized)  Cognitive communication deficit  Other abnormalities of gait and mobility  ONSET DATE: 11/03/22  SUBJECTIVE:  SUBJECTIVE STATEMENT: My back is fine right now, it was hurting yesterday but it is okay now.   PERTINENT HISTORY:  Dementia, anxiety, PTSD  PAIN:  Are you having pain? Yes: NPRS scale: 0/10 Pain location: low back Pain description: achy Aggravating factors: nothing really, walking a lot Relieving factors: pain meds once a day  PRECAUTIONS: None  WEIGHT BEARING RESTRICTIONS: No  FALLS:  Has patient fallen in last 6 months? No  LIVING ENVIRONMENT: Lives with: lives with their spouse Lives in: House/apartment Stairs: Yes: External: 3 steps; can reach both Has following equipment at home: Single point cane and Walker - 2 wheeled  OCCUPATION: retired  PLOF:  Independent  PATIENT GOALS: to make my back quit hurting   NEXT MD VISIT: 12/11/22  OBJECTIVE:   DIAGNOSTIC FINDINGS:  Per patient he has a curve in lower lumbar spine   SCREENING FOR RED FLAGS: Bowel or bladder incontinence: No Spinal tumors: No Cauda equina syndrome: No Compression fracture: No Abdominal aneurysm: No  COGNITION: Overall cognitive status: Within functional limits for tasks assessed and Impaired     SENSATION: WFL  MUSCLE LENGTH: Hamstrings: mod tightness bilaterally  POSTURE: rounded shoulders and forward head  PALPATION: No TTP or pain   LUMBAR ROM: no pain  AROM eval  Flexion 75%   Extension 50%  Right lateral flexion Fib head  Left lateral flexion Fib head  Right rotation 75%  Left rotation 75%   (Blank rows = not tested)  LOWER EXTREMITY ROM:  grossly WNL  LOWER EXTREMITY MMT:  5/5   LUMBAR SPECIAL TESTS:  Straight leg raise test: Positive and FABER test: Negative  FUNCTIONAL TESTS:  5 times sit to stand: 12.63s   TODAY'S TREATMENT:                                                                                                                              DATE:  12/10/22 NuStep L5 x56mins  Seated rows 35# 2x10 Lat pull downs 35# 2x10 Leg press 40# 2x10 Leg ext 15# 2x10 HS curls 25# 2x10 Pball roll outs  Walking around back building  12/03/22 Walking outdoors  Shoulder ext 10# 2x10 Leg ext 10# 2x10 HS curls 25# 2x10 NuStep L5 x23mins  Calf stretch 30s  Rows and Lats 25# 2x10 Seated HS stretch 30s    11/26/22 NuStep L5 x99mins  Stretching HS, piriformis, SK2C, trunk rotations  Bridges 2x10  Rows and lats 25# 2x10 Leg press 20# 2x10 Calf stretch 30s  Calf raises 2x10  STS with chest press yellow ball 2x10   11/19/22 NuStep L 5 x 6 min  S2S OHP yellow ball 2x10 Shoulder Ext 10lb 2x10  HS curls 25lb 2x10  Leg Ext 10lb 2x10 Bridges x10 LE stretching HS, piriformis, single K2C, lower trunk rotation  11/11/22- EVAL     PATIENT EDUCATION:  Education details: POC and HEP Person educated: Patient Education method: Explanation Education comprehension: verbalized understanding  HOME  EXERCISE PROGRAM: Access Code: [email or web address] URL: [email or web address] Date: 11/11/2022 Prepared by: Cassie Freer  Exercises - Supine Bridge  - 1 x daily - 7 x weekly - 2 sets - 10 reps - Supine Lower Trunk Rotation  - 1 x daily - 7 x weekly - 2 sets - 10 reps - Seated Hamstring Stretch  - 1 x daily - 7 x weekly - 2 sets - 30 hold  ASSESSMENT:  CLINICAL IMPRESSION: Patient reports his back is feeling better and he does not have pain right now. He does have dementia and some issues with processing at times. Pt requires lots of verbal and tactile cueing throughout sessions. Unable to straighten out legs completely with leg extensions. He veers to the left a lot when walking and does not clear his feet. At risk for tripping because of this.    REHAB POTENTIAL: Good  CLINICAL DECISION MAKING: Stable/uncomplicated  EVALUATION COMPLEXITY: Low   GOALS: Goals reviewed with patient? Yes  SHORT TERM GOALS: Target date: 12/16/22  Patient will be independent with initial HEP.  Goal status: INITIAL   LONG TERM GOALS: Target date: 01/20/23  Patient will be independent with advanced/ongoing HEP to improve outcomes and carryover.  Goal status: INITIAL  2.  Patient will report 75% improvement in low back pain to improve QOL.  Baseline: 2/10 Goal status: INITIAL  3.  Patient will demonstrate full pain free lumbar ROM to perform ADLs.   Baseline: see chart above Goal status: INITIAL  4.  Patient to demonstrate ability to achieve and maintain good spinal alignment/posturing and body mechanics needed for daily activities. Goal status: INITIAL   PLAN:  PT FREQUENCY: 1-2x/week  PT DURATION: 10 weeks  PLANNED INTERVENTIONS: Therapeutic exercises, Therapeutic activity, Neuromuscular re-education, Balance  training, Gait training, Patient/Family education, Self Care, Joint mobilization, Stair training, Dry Needling, Cryotherapy, Moist heat, Traction, and Manual therapy.  PLAN FOR NEXT SESSION: gym activities to work on low back and core strengthening, stretch   [employer], PT 12/10/2022, 9:25 AM

## 2022-12-10 ENCOUNTER — Ambulatory Visit: Payer: Medicare HMO

## 2022-12-10 DIAGNOSIS — M5459 Other low back pain: Secondary | ICD-10-CM

## 2022-12-10 DIAGNOSIS — R41841 Cognitive communication deficit: Secondary | ICD-10-CM | POA: Diagnosis not present

## 2022-12-10 DIAGNOSIS — M6281 Muscle weakness (generalized): Secondary | ICD-10-CM

## 2022-12-10 DIAGNOSIS — R2689 Other abnormalities of gait and mobility: Secondary | ICD-10-CM

## 2022-12-11 DIAGNOSIS — M47816 Spondylosis without myelopathy or radiculopathy, lumbar region: Secondary | ICD-10-CM | POA: Diagnosis not present

## 2022-12-17 ENCOUNTER — Ambulatory Visit: Payer: Medicare HMO | Admitting: Physical Therapy

## 2022-12-17 DIAGNOSIS — M6281 Muscle weakness (generalized): Secondary | ICD-10-CM

## 2022-12-17 DIAGNOSIS — R2689 Other abnormalities of gait and mobility: Secondary | ICD-10-CM | POA: Diagnosis not present

## 2022-12-17 DIAGNOSIS — M5459 Other low back pain: Secondary | ICD-10-CM

## 2022-12-17 DIAGNOSIS — R41841 Cognitive communication deficit: Secondary | ICD-10-CM | POA: Diagnosis not present

## 2022-12-17 NOTE — Therapy (Signed)
OUTPATIENT PHYSICAL THERAPY THORACOLUMBAR TREATMENT   Patient Name: Patrick Macias MRN: [id number] DOB:[birth year], 82 y.o., male Today's Date: 12/17/2022  END OF SESSION:  PT End of Session - 12/17/22 0844     Visit Number 6    Date for PT Re-Evaluation 01/20/23    PT Start Time 0845    PT Stop Time 0930    PT Time Calculation (min) 45 min    Activity Tolerance Patient tolerated treatment well    Behavior During Therapy WFL for tasks assessed/performed             Past Medical History:  Diagnosis Date   Anxiety    Aortic atherosclerosis (HCC)    BPH (benign prostatic hyperplasia)    Carotid atherosclerosis    Cerumen impaction    Coronary artery calcification    Dyslipidemia    Frequent PVCs    H/O lithotripsy    Hearing decreased, bilateral    Hx of colonic polyps    IFG (impaired fasting glucose)    Macrocytic anemia    Macrocytosis    Memory loss    MGUS (monoclonal gammopathy of unknown significance)    Nephrolithiasis    TIA (transient ischemic attack)    Vitamin D deficiency    Past Surgical History:  Procedure Laterality Date   BALLOON DILATION Bilateral 08/20/2022   Procedure: BALLOON DILATION;  Surgeon: Willis Modena, MD;  Location: [facility];  Service: Gastroenterology;  Laterality: Bilateral;   BIOPSY  08/20/2022   Procedure: BIOPSY;  Surgeon: Willis Modena, MD;  Location: [facility];  Service: Gastroenterology;;   BOTOX INJECTION N/A 08/20/2022   Procedure: BOTOX INJECTION;  Surgeon: Willis Modena, MD;  Location: [facility];  Service: Gastroenterology;  Laterality: N/A;  100 units   CATARACT EXTRACTION, BILATERAL     ESOPHAGEAL MANOMETRY N/A 04/16/2022   Procedure: ESOPHAGEAL MANOMETRY (EM);  Surgeon: Willis Modena, MD;  Location: [facility];  Service: Gastroenterology;  Laterality: N/A;   ESOPHAGOGASTRODUODENOSCOPY (EGD) WITH PROPOFOL Bilateral 08/20/2022   Procedure: ESOPHAGOGASTRODUODENOSCOPY (EGD) WITH PROPOFOL;  Surgeon: Willis Modena, MD;  Location: [facility];  Service: Gastroenterology;  Laterality: Bilateral;   PROSTATECTOMY     Partial   Patient Active Problem List   Diagnosis Date Noted   Macrocytic anemia 04/02/2021   MGUS (monoclonal gammopathy of unknown significance) 12/28/2020   Cerebral vascular disease 08/01/2020   SIRS (systemic inflammatory response syndrome) (HCC) 06/30/2020   Metabolic encephalopathy 06/30/2020   Cognitive impairment 06/04/2020   TIA (transient ischemic attack) 06/01/2020   Dementia without behavioral disturbance (HCC) 06/01/2020   Anxiety 06/01/2020   HTN (hypertension) 06/01/2020   Right shoulder pain 08/10/2013    PCP: Rodrigo Ran  REFERRING PROVIDER: Norman Herrlich  REFERRING DIAG:  M54.50 (ICD-10-CM) - Low back pain, unspecified  G89.29 (ICD-10-CM) - Other chronic pain    Rationale for Evaluation and Treatment: Rehabilitation  THERAPY DIAG:  Other low back pain  Muscle weakness (generalized)  ONSET DATE: 11/03/22  SUBJECTIVE:  SUBJECTIVE STATEMENT: Feeling ok  PERTINENT HISTORY:  Dementia, anxiety, PTSD  PAIN:  Are you having pain? Yes: NPRS scale: 0/10 Pain location: low back Pain description: achy Aggravating factors: nothing really, walking a lot Relieving factors: pain meds once a day  PRECAUTIONS: None  WEIGHT BEARING RESTRICTIONS: No  FALLS:  Has patient fallen in last 6 months? No  LIVING ENVIRONMENT: Lives with: lives with their spouse Lives in: House/apartment Stairs: Yes: External: 3 steps; can reach both Has following equipment at home: Single point cane and Walker - 2 wheeled  OCCUPATION: retired  PLOF: Independent  PATIENT GOALS: to make my back quit hurting   NEXT MD VISIT: 12/11/22  OBJECTIVE:   DIAGNOSTIC FINDINGS:  Per patient he  has a curve in lower lumbar spine   SCREENING FOR RED FLAGS: Bowel or bladder incontinence: No Spinal tumors: No Cauda equina syndrome: No Compression fracture: No Abdominal aneurysm: No  COGNITION: Overall cognitive status: Within functional limits for tasks assessed and Impaired     SENSATION: WFL  MUSCLE LENGTH: Hamstrings: mod tightness bilaterally  POSTURE: rounded shoulders and forward head  PALPATION: No TTP or pain   LUMBAR ROM: no pain  AROM eval 12/17/22  Flexion 75%  Limited 25%  Extension 50% WFL  Right lateral flexion Fib head WFL  Left lateral flexion Fib head WFL  Right rotation 75% WFL  Left rotation 75% WFL   (Blank rows = not tested)  LOWER EXTREMITY ROM:  grossly WNL  LOWER EXTREMITY MMT:  5/5   LUMBAR SPECIAL TESTS:  Straight leg raise test: Positive and FABER test: Negative  FUNCTIONAL TESTS:  5 times sit to stand: 12.63s   TODAY'S TREATMENT:                                                                                                                              DATE:  12/17/22 Bike L3 x 5 min UBE L2 x 3 min GOALS  S2S OHP yellow ball 2x10 Leg ext 15# 2x10 HS curls 35# 2x10 6in step ups x5 each Mod verbal and Tactiel cues for sequencing Seated Rows 35lb 2x10  12/10/22 NuStep L5 x13mins  Seated rows 35# 2x10 Lat pull downs 35# 2x10 Leg press 40# 2x10 Leg ext 15# 2x10 HS curls 25# 2x10 Pball roll outs  Walking around back building  12/03/22 Walking outdoors  Shoulder ext 10# 2x10 Leg ext 10# 2x10 HS curls 25# 2x10 NuStep L5 x77mins  Calf stretch 30s  Rows and Lats 25# 2x10 Seated HS stretch 30s    11/26/22 NuStep L5 x62mins  Stretching HS, piriformis, SK2C, trunk rotations  Bridges 2x10  Rows and lats 25# 2x10 Leg press 20# 2x10 Calf stretch 30s  Calf raises 2x10  STS with chest press yellow ball 2x10   PATIENT EDUCATION:  Education details: POC and HEP Person educated: Patient Education method:  Explanation Education comprehension: verbalized understanding  HOME EXERCISE PROGRAM: Access Code: [email or web address] URL: [email or web address]  Date: 11/11/2022 Prepared by: Cassie Freer  Exercises - Supine Bridge  - 1 x daily - 7 x weekly - 2 sets - 10 reps - Supine Lower Trunk Rotation  - 1 x daily - 7 x weekly - 2 sets - 10 reps - Seated Hamstring Stretch  - 1 x daily - 7 x weekly - 2 sets - 30 hold  ASSESSMENT:  CLINICAL IMPRESSION: Patient enters doing well. He has [progressed meeting some LTG's. He does have dementia and some issues with processing at times, requiring increase time. Pt requires lots of verbal and tactile cueing throughout sessions. Unable to straighten out legs completely with leg extensions. Increase resistance tolerated with seated rows and lats. Pt had a very difficult time maintaining sequencing ith step ups.   REHAB POTENTIAL: Good  CLINICAL DECISION MAKING: Stable/uncomplicated  EVALUATION COMPLEXITY: Low   GOALS: Goals reviewed with patient? Yes  SHORT TERM GOALS: Target date: 12/16/22  Patient will be independent with initial HEP.  Goal status: INITIAL   LONG TERM GOALS: Target date: 01/20/23  Patient will be independent with advanced/ongoing HEP to improve outcomes and carryover.  Goal status: INITIAL  2.  Patient will report 75% improvement in low back pain to improve QOL.  Baseline: 2/10 Goal status: Met 12/17/22  3.  Patient will demonstrate full pain free lumbar ROM to perform ADLs.   Baseline: see chart above Goal status: Progressing 12/17/22  4.  Patient to demonstrate ability to achieve and maintain good spinal alignment/posturing and body mechanics needed for daily activities. Goal status: Progressing 12/17/22   PLAN:  PT FREQUENCY: 1-2x/week  PT DURATION: 10 weeks  PLANNED INTERVENTIONS: Therapeutic exercises, Therapeutic activity, Neuromuscular re-education, Balance training, Gait training, Patient/Family education,  Self Care, Joint mobilization, Stair training, Dry Needling, Cryotherapy, Moist heat, Traction, and Manual therapy.  PLAN FOR NEXT SESSION: gym activities to work on low back and core strengthening, stretch   Grayce Sessions, PTA 12/17/2022, 8:47 AM

## 2022-12-23 NOTE — Therapy (Signed)
OUTPATIENT PHYSICAL THERAPY THORACOLUMBAR TREATMENT   Patient Name: Patrick Macias MRN: [id number] DOB:[birth year], 82 y.o., male Today's Date: 12/23/2022  END OF SESSION:    Past Medical History:  Diagnosis Date   Anxiety    Aortic atherosclerosis (HCC)    BPH (benign prostatic hyperplasia)    Carotid atherosclerosis    Cerumen impaction    Coronary artery calcification    Dyslipidemia    Frequent PVCs    H/O lithotripsy    Hearing decreased, bilateral    Hx of colonic polyps    IFG (impaired fasting glucose)    Macrocytic anemia    Macrocytosis    Memory loss    MGUS (monoclonal gammopathy of unknown significance)    Nephrolithiasis    TIA (transient ischemic attack)    Vitamin D deficiency    Past Surgical History:  Procedure Laterality Date   BALLOON DILATION Bilateral 08/20/2022   Procedure: BALLOON DILATION;  Surgeon: Willis Modena, MD;  Location: [facility];  Service: Gastroenterology;  Laterality: Bilateral;   BIOPSY  08/20/2022   Procedure: BIOPSY;  Surgeon: Willis Modena, MD;  Location: [facility];  Service: Gastroenterology;;   BOTOX INJECTION N/A 08/20/2022   Procedure: BOTOX INJECTION;  Surgeon: Willis Modena, MD;  Location: [facility];  Service: Gastroenterology;  Laterality: N/A;  100 units   CATARACT EXTRACTION, BILATERAL     ESOPHAGEAL MANOMETRY N/A 04/16/2022   Procedure: ESOPHAGEAL MANOMETRY (EM);  Surgeon: Willis Modena, MD;  Location: [facility];  Service: Gastroenterology;  Laterality: N/A;   ESOPHAGOGASTRODUODENOSCOPY (EGD) WITH PROPOFOL Bilateral 08/20/2022   Procedure: ESOPHAGOGASTRODUODENOSCOPY (EGD) WITH PROPOFOL;  Surgeon: Willis Modena, MD;  Location: [facility];  Service: Gastroenterology;  Laterality: Bilateral;   PROSTATECTOMY     Partial   Patient Active Problem List   Diagnosis Date Noted   Macrocytic anemia 04/02/2021   MGUS (monoclonal gammopathy of unknown significance) 12/28/2020   Cerebral vascular disease  08/01/2020   SIRS (systemic inflammatory response syndrome) (HCC) 06/30/2020   Metabolic encephalopathy 06/30/2020   Cognitive impairment 06/04/2020   TIA (transient ischemic attack) 06/01/2020   Dementia without behavioral disturbance (HCC) 06/01/2020   Anxiety 06/01/2020   HTN (hypertension) 06/01/2020   Right shoulder pain 08/10/2013    PCP: Rodrigo Ran  REFERRING PROVIDER: Norman Herrlich  REFERRING DIAG:  M54.50 (ICD-10-CM) - Low back pain, unspecified  G89.29 (ICD-10-CM) - Other chronic pain    Rationale for Evaluation and Treatment: Rehabilitation  THERAPY DIAG:  No diagnosis found.  ONSET DATE: 11/03/22  SUBJECTIVE:  SUBJECTIVE STATEMENT: The back is better   PERTINENT HISTORY:  Dementia, anxiety, PTSD  PAIN:  Are you having pain? Yes: NPRS scale: 0/10 Pain location: low back Pain description: achy Aggravating factors: nothing really, walking a lot Relieving factors: pain meds once a day  PRECAUTIONS: None  WEIGHT BEARING RESTRICTIONS: No  FALLS:  Has patient fallen in last 6 months? No  LIVING ENVIRONMENT: Lives with: lives with their spouse Lives in: House/apartment Stairs: Yes: External: 3 steps; can reach both Has following equipment at home: Single point cane and Walker - 2 wheeled  OCCUPATION: retired  PLOF: Independent  PATIENT GOALS: to make my back quit hurting   NEXT MD VISIT: 12/11/22  OBJECTIVE:   DIAGNOSTIC FINDINGS:  Per patient he has a curve in lower lumbar spine   SCREENING FOR RED FLAGS: Bowel or bladder incontinence: No Spinal tumors: No Cauda equina syndrome: No Compression fracture: No Abdominal aneurysm: No  COGNITION: Overall cognitive status: Within functional limits for tasks assessed and Impaired     SENSATION: WFL  MUSCLE  LENGTH: Hamstrings: mod tightness bilaterally  POSTURE: rounded shoulders and forward head  PALPATION: No TTP or pain   LUMBAR ROM: no pain  AROM eval 12/17/22  Flexion 75%  Limited 25%  Extension 50% WFL  Right lateral flexion Fib head WFL  Left lateral flexion Fib head WFL  Right rotation 75% WFL  Left rotation 75% WFL   (Blank rows = not tested)  LOWER EXTREMITY ROM:  grossly WNL  LOWER EXTREMITY MMT:  5/5   LUMBAR SPECIAL TESTS:  Straight leg raise test: Positive and FABER test: Negative  FUNCTIONAL TESTS:  5 times sit to stand: 12.63s   TODAY'S TREATMENT:                                                                                                                              DATE:  12/24/22 UBE L4 x2 mins each way NuStep L5 x40mins  Leg ext 15# 2x10 HS curls 35# 2x10 Shoulder ext 10# 2x10 Rows and lat pulls 35# 2x10 BlackTB ext 2x10   12/17/22 Bike L3 x 5 min UBE L2 x 3 min GOALS  S2S OHP yellow ball 2x10 Leg ext 15# 2x10 HS curls 35# 2x10 6in step ups x5 each Mod verbal and Tactiel cues for sequencing Seated Rows 35lb 2x10  12/10/22 NuStep L5 x57mins  Seated rows 35# 2x10 Lat pull downs 35# 2x10 Leg press 40# 2x10 Leg ext 15# 2x10 HS curls 25# 2x10 Pball roll outs  Walking around back building  12/03/22 Walking outdoors  Shoulder ext 10# 2x10 Leg ext 10# 2x10 HS curls 25# 2x10 NuStep L5 x38mins  Calf stretch 30s  Rows and Lats 25# 2x10 Seated HS stretch 30s    11/26/22 NuStep L5 x70mins  Stretching HS, piriformis, SK2C, trunk rotations  Bridges 2x10  Rows and lats 25# 2x10 Leg press 20# 2x10 Calf stretch 30s  Calf raises 2x10  STS with chest press yellow ball 2x10   PATIENT EDUCATION:  Education details: POC and HEP Person educated: Patient Education method: Explanation Education comprehension: verbalized understanding  HOME EXERCISE PROGRAM: Access Code: [email or web address] URL: [email or web address] Date:  11/11/2022 Prepared by: Cassie Freer  Exercises - Supine Bridge  - 1 x daily - 7 x weekly - 2 sets - 10 reps - Supine Lower Trunk Rotation  - 1 x daily - 7 x weekly - 2 sets - 10 reps - Seated Hamstring Stretch  - 1 x daily - 7 x weekly - 2 sets - 30 hold  ASSESSMENT:  CLINICAL IMPRESSION: Patient enters doing well. Reports his back is feeling better. He has dementia and some issues with processing at times, requiring increase time with certain tasks. Seemed to be very confused when PT demonstrated shoulder extension with cables. Took a break to sit down and re gather before continuing, reports a little bit of dizziness. Pt requires lots of verbal and tactile cueing throughout sessions. Does best with short, concise directions.   REHAB POTENTIAL: Good  CLINICAL DECISION MAKING: Stable/uncomplicated  EVALUATION COMPLEXITY: Low   GOALS: Goals reviewed with patient? Yes  SHORT TERM GOALS: Target date: 12/16/22  Patient will be independent with initial HEP.  Goal status: INITIAL   LONG TERM GOALS: Target date: 01/20/23  Patient will be independent with advanced/ongoing HEP to improve outcomes and carryover.  Goal status: INITIAL  2.  Patient will report 75% improvement in low back pain to improve QOL.  Baseline: 2/10 Goal status: Met 12/17/22  3.  Patient will demonstrate full pain free lumbar ROM to perform ADLs.   Baseline: see chart above Goal status: Progressing 12/17/22  4.  Patient to demonstrate ability to achieve and maintain good spinal alignment/posturing and body mechanics needed for daily activities. Goal status: Progressing 12/17/22   PLAN:  PT FREQUENCY: 1-2x/week  PT DURATION: 10 weeks  PLANNED INTERVENTIONS: Therapeutic exercises, Therapeutic activity, Neuromuscular re-education, Balance training, Gait training, Patient/Family education, Self Care, Joint mobilization, Stair training, Dry Needling, Cryotherapy, Moist heat, Traction, and Manual therapy.  PLAN  FOR NEXT SESSION: gym activities to work on low back and core strengthening, stretch   [employer], PT 12/23/2022, 4:34 PM

## 2022-12-24 ENCOUNTER — Ambulatory Visit: Payer: Medicare HMO

## 2022-12-24 DIAGNOSIS — R41841 Cognitive communication deficit: Secondary | ICD-10-CM | POA: Diagnosis not present

## 2022-12-24 DIAGNOSIS — M5459 Other low back pain: Secondary | ICD-10-CM | POA: Diagnosis not present

## 2022-12-24 DIAGNOSIS — M6281 Muscle weakness (generalized): Secondary | ICD-10-CM | POA: Diagnosis not present

## 2022-12-24 DIAGNOSIS — R2689 Other abnormalities of gait and mobility: Secondary | ICD-10-CM | POA: Diagnosis not present

## 2023-01-12 ENCOUNTER — Telehealth: Payer: Self-pay | Admitting: Neurology

## 2023-01-12 NOTE — Telephone Encounter (Signed)
Called and spoke to wife. Two and half weeks ago he started having short bursts of shaking and weakness lasting about 30 seconds for one time a day, now it is happening up to three times a day and she is scared he is going to fall. She states he sits down and feels better after a few moments. He has not changed any meds or missed any doses according to wife. Wants to know recommendations for the next steps.

## 2023-01-12 NOTE — Telephone Encounter (Signed)
Pt wife called. Stated pt is doing a lot of shaking and very week. Stated it's happening almost twice a day. Stated she don't know if it's coming from medication he is taking. She is requesting a call back from nurse.

## 2023-01-13 ENCOUNTER — Telehealth: Payer: Self-pay | Admitting: Cardiovascular Disease

## 2023-01-13 DIAGNOSIS — E785 Hyperlipidemia, unspecified: Secondary | ICD-10-CM | POA: Diagnosis not present

## 2023-01-13 DIAGNOSIS — R7301 Impaired fasting glucose: Secondary | ICD-10-CM | POA: Diagnosis not present

## 2023-01-13 DIAGNOSIS — G459 Transient cerebral ischemic attack, unspecified: Secondary | ICD-10-CM | POA: Diagnosis not present

## 2023-01-13 DIAGNOSIS — M810 Age-related osteoporosis without current pathological fracture: Secondary | ICD-10-CM | POA: Diagnosis not present

## 2023-01-13 DIAGNOSIS — F0390 Unspecified dementia without behavioral disturbance: Secondary | ICD-10-CM | POA: Diagnosis not present

## 2023-01-13 DIAGNOSIS — D472 Monoclonal gammopathy: Secondary | ICD-10-CM | POA: Diagnosis not present

## 2023-01-13 DIAGNOSIS — I700 Atherosclerosis of aorta: Secondary | ICD-10-CM | POA: Diagnosis not present

## 2023-01-13 DIAGNOSIS — I2510 Atherosclerotic heart disease of native coronary artery without angina pectoris: Secondary | ICD-10-CM | POA: Diagnosis not present

## 2023-01-13 DIAGNOSIS — I10 Essential (primary) hypertension: Secondary | ICD-10-CM | POA: Diagnosis not present

## 2023-01-13 DIAGNOSIS — I6381 Other cerebral infarction due to occlusion or stenosis of small artery: Secondary | ICD-10-CM | POA: Diagnosis not present

## 2023-01-13 DIAGNOSIS — R972 Elevated prostate specific antigen [PSA]: Secondary | ICD-10-CM | POA: Diagnosis not present

## 2023-01-13 NOTE — Telephone Encounter (Signed)
Would recommend visit with PCP to further evaluation, also let cardiology know, has had bradycardia in past.  With spells, have his wife check his blood pressure, heart rate.  Could be blood pressure drop/HR especially if occurs while upright. Make sure drinking enough water.  Has appointment here in a few weeks.

## 2023-01-13 NOTE — Telephone Encounter (Signed)
STAT if patient feels like he/she is going to faint   Are you dizzy now?   Do you feel faint or have you passed out?  No, but wife has concerns with patient possibly falling  Do you have any other symptoms?  Weakness, shakiness   Have you checked your HR and BP (record if available)?  149/70 laying down, 120/50 sitting up HR is in the 50's   Saw Dr. Waynard Edwards this morning--he decreased Propanolol from 60 MG to 20 MG (10 in AM + 10 in PM).

## 2023-01-13 NOTE — Telephone Encounter (Signed)
Called and spoke to pt wife and relayed information and she voiced understanding.

## 2023-01-15 ENCOUNTER — Other Ambulatory Visit: Payer: Self-pay | Admitting: Cardiovascular Disease

## 2023-01-15 NOTE — Telephone Encounter (Signed)
Patient's wife is calling to follow up. Patient's wife stated that the patient had 3 terrible falls yesterday. Patient's wife stated that two falls at 5pm-6pm and one late last night. Patient's wife stated that yesterday was the first day he took the Propanolol 10MG . Please advise

## 2023-01-15 NOTE — Telephone Encounter (Signed)
I spoke with the pts wife and they will continue to monitor since Dr Waynard Edwards decreased his med, I moved up his appt with Ronie Spies PA to 01/30/23.Marland Kitchen they will call if anything worsens.

## 2023-01-15 NOTE — Telephone Encounter (Signed)
149/70 laying down, 120/50 sitting up HR is in the 50's  Sitting 155/68 HR 55, standing 91/58 HR 69  Pt hs been having increasing dizzy spells over the past several weeks... she does not think it is related to positional changes only.   He is not having any headaches and SOB.   Dr Waynard Edwards had decreased his propranolol to 10 mg today.   Per neuro:  Glean Salvo, NP   01/13/23  7:47 AM Note Would recommend visit with PCP to further evaluation, also let cardiology know, has had bradycardia in past.  With spells, have his wife check his blood pressure, heart rate.  Could be blood pressure drop/HR especially if occurs while upright. Make sure drinking enough water.  Has appointment here in a few weeks.     He had a fall after coming in from the heat last night before dinner and was drinking water... but he got dizzy and fell but did not lose . He then fell 2 other times thereafter due to lightheadedness.    I will forward to Dr Nelly Laurence for his review and recommendations.

## 2023-01-19 ENCOUNTER — Other Ambulatory Visit (HOSPITAL_COMMUNITY): Payer: Self-pay | Admitting: *Deleted

## 2023-01-20 ENCOUNTER — Telehealth: Payer: Self-pay | Admitting: Interventional Cardiology

## 2023-01-20 NOTE — Telephone Encounter (Signed)
Spoke to wife. Continued low BPs/symptomatic. Stopped Propranolol this past Saturday per Dr. Waynard Edwards. They are seeing primary cardiology team next Friday for further eval on this matter, and keeping in close contact w/ PCP as well.  Aware I will forward this to Dr. Nelly Laurence for advisement of PVC control and appt if needed. Propranolol removed from [facility]. Recall in for 08/2023 w/ Mealor.

## 2023-01-20 NOTE — Telephone Encounter (Signed)
Pt c/o BP issue: STAT if pt c/o blurred vision, one-sided weakness or slurred speech  1. What are your last 5 BP readings? 116/56 HR 59 (sitting); 87/50 HR 49 (standing)  2. Are you having any other symptoms (ex. Dizziness, headache, blurred vision, passed out)? Dizziness, has the shakes  3. What is your BP issue?

## 2023-01-21 ENCOUNTER — Ambulatory Visit (HOSPITAL_COMMUNITY)
Admission: RE | Admit: 2023-01-21 | Discharge: 2023-01-21 | Disposition: A | Discharge status: Home / Self Care | Payer: Medicare HMO | Source: Ambulatory Visit | Attending: Internal Medicine | Admitting: Internal Medicine

## 2023-01-21 DIAGNOSIS — M810 Age-related osteoporosis without current pathological fracture: Secondary | ICD-10-CM | POA: Insufficient documentation

## 2023-01-21 MED ORDER — ZOLEDRONIC ACID 5 MG/100ML IV SOLN: 5.0000 mg | Freq: Once | INTRAVENOUS | Status: AC

## 2023-01-21 MED ORDER — ZOLEDRONIC ACID 5 MG/100ML IV SOLN
INTRAVENOUS | Status: AC
  Administered 2023-01-21: 5 mg via INTRAVENOUS
  Filled 2023-01-21: qty 100

## 2023-01-23 DIAGNOSIS — R001 Bradycardia, unspecified: Secondary | ICD-10-CM | POA: Diagnosis not present

## 2023-01-23 DIAGNOSIS — I493 Ventricular premature depolarization: Secondary | ICD-10-CM | POA: Diagnosis not present

## 2023-01-23 DIAGNOSIS — I2510 Atherosclerotic heart disease of native coronary artery without angina pectoris: Secondary | ICD-10-CM | POA: Diagnosis not present

## 2023-01-23 DIAGNOSIS — F419 Anxiety disorder, unspecified: Secondary | ICD-10-CM | POA: Diagnosis not present

## 2023-01-23 DIAGNOSIS — F0390 Unspecified dementia without behavioral disturbance: Secondary | ICD-10-CM | POA: Diagnosis not present

## 2023-01-23 DIAGNOSIS — R9431 Abnormal electrocardiogram [ECG] [EKG]: Secondary | ICD-10-CM | POA: Diagnosis not present

## 2023-01-23 DIAGNOSIS — E785 Hyperlipidemia, unspecified: Secondary | ICD-10-CM | POA: Diagnosis not present

## 2023-01-23 DIAGNOSIS — I951 Orthostatic hypotension: Secondary | ICD-10-CM | POA: Diagnosis not present

## 2023-01-23 DIAGNOSIS — I10 Essential (primary) hypertension: Secondary | ICD-10-CM | POA: Diagnosis not present

## 2023-01-23 DIAGNOSIS — G459 Transient cerebral ischemic attack, unspecified: Secondary | ICD-10-CM | POA: Diagnosis not present

## 2023-01-23 DIAGNOSIS — R296 Repeated falls: Secondary | ICD-10-CM | POA: Diagnosis not present

## 2023-01-23 DIAGNOSIS — I6381 Other cerebral infarction due to occlusion or stenosis of small artery: Secondary | ICD-10-CM | POA: Diagnosis not present

## 2023-01-23 NOTE — Telephone Encounter (Signed)
Attempted to contact patient - left message to call back   Mealor, Roberts Gaudy, MD  You; Baird Lyons, RN; [facility] [address] [place] hours ago (7:49 PM)   His PVCs seemed to resolve on propranolol.  I don't think bradycardia was a big issue for him in the past -- he did have low pulse rates with PVCs.  The hypotension is new; I agree with stopping antihypertensives if he is hypotensive, but it's interesting that he didn't actually have falls until he decreased the propranolol.  It may be worth placing a brief monitor to see if PVCs are contributing. I don't have much to offer without rhythm data.

## 2023-01-23 NOTE — Telephone Encounter (Signed)
Spoke with patients wife (DPR on file) patient currently headed to PCP appointment with patient and states will let us know what Dr Waynard Edwards decides to do. Informed of Dr Zoe Lan suggestion of a cardiac monitor, patient will follow up with [place] after Perini visit today.

## 2023-01-27 ENCOUNTER — Other Ambulatory Visit: Payer: Self-pay | Admitting: *Deleted

## 2023-01-27 DIAGNOSIS — R001 Bradycardia, unspecified: Secondary | ICD-10-CM

## 2023-01-28 ENCOUNTER — Ambulatory Visit: Payer: Medicare HMO | Admitting: Neurology

## 2023-01-29 NOTE — Progress Notes (Addendum)
Cardiology Office Note    Date:  01/30/2023  ID:  Patrick Macias, Patrick Macias 03-04-1941, MRN [id number] PCP:  Rodrigo Ran, MD  Cardiologist:  Lesleigh Noe, MD (Inactive)  Electrophysiologist:  Maurice Small, MD   Chief Complaint: dizziness  History of Present Illness: .    Patrick Macias is a 82 y.o. male with visit-pertinent history of coronary calcification, carotid artery disease, memory loss, anxiety, BPH, HTN, colon polyps, nephrolithasis, vit D deficiency, macrocytic anemia, MGUS, TIA, frequent PVCs, NSVT, mild-moderate AI, mild MR who is seen for follow-up.   He had a prior calcium score in 01/2019 with CAC 846 (75%ile), aortic atherosclerosis, followed by normal stress test 05/2019. Echocardiogram 05/2020 showed EF 60-65%, moderate RVE obtained during admission for possible TIA for which he was treated with ASA + Plavix for 3 weeks followed by ASA monotherapy. There was concern that this was either TIA versus related to his underlying cognitive impairment. CT angio head/neck at that time had shown no large vessel occlusion (20-30% prox R ICA and left carotid bulb). I recently met him in follow-up 02/2022. He was recently seen fall 2023 for HR registering 30s-40s at times on pulse ox, confirmed to be related to frequent PVCs not registering on pulse ox. F/u echo 04/01/22 showed EF 50-55%, mildly enlarged RV, mild-moderate AI, mild MR. Carotid duplex 03/2022 for R Carotid bruit was reassuring with only 1-39% BICA. 3 day monitor showed 28% PVC burden, 12 beats NSVT, 17 episodes of NSVT longest of 6 beats, prompting Toprol 25mg  daily and EP eval. Nuclear stress test done to exclude ischemia as cause of ectopy 06/2022 was negative for ischemia, EF 54% with normal WMA visually appearing greater. Saw Dr. Nelly Laurence 07/22/22 who switched metoprolol to propranolol and added magnesium taurate. His PVCs seemed to have resolved by 08/2022 EP f/u.  However, he returns for evaluation of dizziness. History is  provided by his family given his memory loss. About 2 months ago he began experiencing episodes of dizziness and diffuse tremors upon standing/moving about. His family began to notice his BP would drop into the 70s-80s at times with these episodes. They also saw HR in the 40s at times by BP cuff. He was seen by primary care and his propranolol was discontinued. He was then started on fludrocortisone 0.1mg  every other day with substantial improvement in symptoms. He still seems to be experiencing an orthostatic BP drop but is no longer symptomatic with this. Per phone notes Dr. Nelly Laurence had also recommended repeat event monitor which is being arranged. He has not had any formal syncope. He has also been referred to neurology and sees them in a week. He otherwise has not had any anginal symptoms.  Orthostatics today (a non-fludrocortisone day):  Lying 133/73 P 58 Sitting 114/55 P 62 Standing 56m 92/57 P 72 - no symptoms Standing 34m 110/63 P 72 - no symptoms  Labwork independently reviewed: 04/2022 Hgb 13.4, plt 209, K 4.2, Cr 0.97, LFTs ok 02/2022 TSH wnl, Mg 2.4    ROS: .    Please see the history of present illness.  All other systems are reviewed and otherwise negative.  Studies Reviewed: Marland Kitchen    EKG:  EKG is ordered today, personally reviewed, demonstrating NSR 60bpm, first degree AVB, nonspecific STTW changes, one fusion beat  CV Studies: Cardiac studies reviewed are outlined and summarized above. Otherwise please see EMR for full report.    Physical Exam:    VS:  BP (!) 114/55  Pulse 60   Ht 5\' 11"  (1.803 m)   Wt 176 lb 6.4 oz (80 kg)   SpO2 99%   BMI 24.60 kg/m    Wt Readings from Last 3 Encounters:  01/30/23 176 lb 6.4 oz (80 kg)  01/21/23 175 lb (79.4 kg)  09/18/22 181 lb (82.1 kg)    GEN: Well nourished, well developed in no acute distress NECK: No JVD; No carotid bruits CARDIAC: RRR, no murmurs, rubs, gallops RESPIRATORY:  Clear to auscultation without rales, wheezing  or rhonchi  ABDOMEN: Soft, non-tender, non-distended EXTREMITIES:  No edema; No acute deformity   Asessement and Plan:.    1. Dizziness with recent orthostatic hypotension - etiology of dizziness is most likely orthostasis. I think the reported HR in the 40s by monitor is likely a red herring related to his frequent PVCs which historically do not pick up well on either pulse ox or BP cuff. Regardless, his BP will not support beta blocker at this time. Agree with remaining off propranolol. Dr. Nelly Laurence plans monitor which I agree with. We will update his labs today. They report PCP did a week ago too and everything looked OK except PSA. His symptoms have improved substantially with fludocortisone so I would continue but increase to once daily given that his BP parameters still demonstrate a drop in orthostatic BP. They are aware to notify for edema. I told them to notify for any recurrent symptoms whatsoever in which I would recommend addition of midodrine. I agree with him seeing neurology to evaluate if perhaps there is an underlying component of autonomic neuropathy adjoining his diagnosis of dementia. We discussed adequate hydration and use of compression stockings, preferably thigh high, as well as slow and steady movements with hesitation before walking. We also discussed use of assistive aid. They have a walker at home. We'll also update echo to ensure no structural changes.  2. H/o PVCs, NSVT - propranolol on hold given recent hypotension. This is being managed by Dr. Nelly Laurence with event monitor planned.   3. Mild-moderate AI, mild MR - will be able to reassess by echo above.  4. Coronary calcification - no recent accelerating anginal symptoms. Able to do physical activity without symptoms during exertion. Lipids are managed by PCP. Continue ASA with caution and statin. If falling recurs, would need to consider discontinuation of ASA.  5. Carotid artery disease - mild disease on duplex. Remains on  ASA, statin with lipids followed by PCP. Consider repeat study in 2 years (03/2024) if clinically appropriate. Would not expect this to be contributing to his issues above.    Disposition: F/u with me in 2-3 weeks, and Dr. Izora Ribas to establish care for general cardiology in 3-4 months. Continue to f/u EP as per Dr. Morrie Sheldon previous recommendations.  Signed, Laurann Montana, PA-C

## 2023-01-30 ENCOUNTER — Ambulatory Visit: Payer: Medicare HMO | Admitting: Physician Assistant

## 2023-01-30 ENCOUNTER — Encounter: Payer: Self-pay | Admitting: Physician Assistant

## 2023-01-30 VITALS — BP 114/55 | HR 60 | Ht 71.0 in | Wt 176.4 lb

## 2023-01-30 DIAGNOSIS — I340 Nonrheumatic mitral (valve) insufficiency: Secondary | ICD-10-CM | POA: Diagnosis not present

## 2023-01-30 DIAGNOSIS — I2584 Coronary atherosclerosis due to calcified coronary lesion: Secondary | ICD-10-CM | POA: Diagnosis not present

## 2023-01-30 DIAGNOSIS — I6523 Occlusion and stenosis of bilateral carotid arteries: Secondary | ICD-10-CM

## 2023-01-30 DIAGNOSIS — I951 Orthostatic hypotension: Secondary | ICD-10-CM | POA: Diagnosis not present

## 2023-01-30 DIAGNOSIS — I4729 Other ventricular tachycardia: Secondary | ICD-10-CM

## 2023-01-30 DIAGNOSIS — I2510 Atherosclerotic heart disease of native coronary artery without angina pectoris: Secondary | ICD-10-CM | POA: Diagnosis not present

## 2023-01-30 DIAGNOSIS — I351 Nonrheumatic aortic (valve) insufficiency: Secondary | ICD-10-CM | POA: Diagnosis not present

## 2023-01-30 DIAGNOSIS — I493 Ventricular premature depolarization: Secondary | ICD-10-CM

## 2023-01-30 MED ORDER — FLUDROCORTISONE ACETATE 0.1 MG PO TABS: 0.1000 mg | ORAL_TABLET | Freq: Every day | ORAL | 3 refills | Status: DC

## 2023-01-30 NOTE — Patient Instructions (Signed)
Medication Instructions:  Your physician has recommended you make the following change in your medication:   Increase fludrocortisone 0.1 mg daily  *If you need a refill on your cardiac medications before your next appointment, please call your pharmacy*   Lab Work: BMET, CBC, TSH If you have labs (blood work) drawn today and your tests are completely normal, you will receive your results only by: MyChart Message (if you have MyChart) OR A paper copy in the mail If you have any lab test that is abnormal or we need to change your treatment, we will call you to review the results.   Testing/Procedures: Your physician has requested that you have an echocardiogram. Echocardiography is a painless test that uses sound waves to create images of your heart. It provides your doctor with information about the size and shape of your heart and how well your heart's chambers and valves are working. This procedure takes approximately one hour. There are no restrictions for this procedure. Please do NOT wear cologne, perfume, aftershave, or lotions (deodorant is allowed). Please arrive 15 minutes prior to your appointment time.  Follow-Up: At [facility], you and your health needs are our priority.  As part of our continuing mission to provide you with exceptional heart care, we have created designated Provider Care Teams.  These Care Teams include your primary Cardiologist (physician) and Advanced Practice Providers (APPs -  Physician Assistants and Nurse Practitioners) who all work together to provide you with the care you need, when you need it.   Your next appointment:   2-3 week(s)    8/19 or 8/26  Provider:   Ronie Spies, PA-C     Then, Dr Izora Ribas in 3- 4 monnths

## 2023-01-31 ENCOUNTER — Telehealth: Payer: Self-pay | Admitting: Cardiology

## 2023-01-31 NOTE — Telephone Encounter (Signed)
This morning I was notified by Ronie Spies PA-C that Patrick Macias had new AKI on his labs she had ordered yesterday. Ronie Spies is currently traveling, and is able to view results from epic, but is unable to contact Patrick Macias. She asked me to pass along the following recommendations  Patrick Macias is a 82 year old male who was seen by [facility] yesterday in clinic. At that time, Patrick Macias reported that he had been having dizziness, falls, and tremors when standing for the past 2 months. Patrick Macias had been seen by his PCP, and was found to have profound orthostasis. PCP had started fludrocortisone every other day, and Patrick Macias did have significant improvement in symptoms. Yesterday in clinic, Patrick Macias continued to be orthostatic with SBP dropping from 133>92 upon standing. On labs yesterday, BUN came back at 35 and creatinine elevated to 2.02. Patrick Macias is not on any medications that would cause him to be orthostatic or to have caused AKI. Kriste Basque is concerned that there may be something neurologic going on as Patrick Macias has newly developed dementia, tremors, and orthostatic hypotension. Given his new and unexplained AKI, [name] wants Patrick Macias to go to the ED for evaluation and possibly IV fluids.   I contacted and discussed with Patrick Macias's Wife, Patrick Macias. I passed along [name]'s recommendations that Patrick Macias should go to the ED for repeat lab work, possible IV fluids, and further evaluation. Patrick Macias's wife voiced understanding and agreement.   Jonita Albee, PA-C 01/31/2023 8:04 AM

## 2023-01-31 NOTE — Telephone Encounter (Signed)
Received a call from the patient's daughter Patrick Macias at 9:07 this morning.  Returned call back.  Patient's mother had just received a telephone call from Patrick Macias, [place] about blood work that was drawn yesterday.  They had several questions and were concerned about taking Patrick Macias to the emergency department.  Reviewed notes from Patrick Macias, [place] visit yesterday in clinic.  The patient had reported dizziness, falls, tremors when standing for the past 2 months.  He was seen by his PCP and was found to have profound orthostasis.  The PCP has started him on Florinef every other day and the patient did have significant improvement in his symptoms.  Yesterday in clinic patient continued to be orthostatic with systolic blood pressure dropping from 1 33-92 upon standing.  Labs came back with a BUN of 35 and creatinine elevated above his baseline of 2.02.  Patient was called and advised to go to the emergency department for further evaluation repeat lab work and possible IV fluids.  Patient has a baseline dementia and Patrick Macias was concerned as the last time he was hospitalized in the emergency department it exacerbated his symptoms.  They had several questions wanted to know if this was something that they had to do right away or if they could reevaluate.  We discussed his AKI and encouraged the emergency department visit.  The wife was brought in on three-way call and further discussed the patient's condition.  Yesterday at his visit he had endorsed having a large fluid intake when according to his wife today that was slightly exaggerated to what he typically takes.  They were given signs and symptoms to watch for as far as worsening dizziness, lightheadedness, falls, worsening tremors, decreased urine output or darkened urine color.  If they were unable to convince Patrick Macias to go to the emergency department for further evaluation this weekend and they were advised that they needed to follow-up with her  PCP first thing on Monday to have blood work repeated and have reevaluation done.   Patients daughter and his were appreciative of signs and symptoms to look for and understood the risk of him not going for further evaluation and could worsen the acute kidney injury that was found on his labs.  They were also encouraged to call back if they had any further questions.

## 2023-02-01 DIAGNOSIS — R001 Bradycardia, unspecified: Secondary | ICD-10-CM | POA: Diagnosis not present

## 2023-02-02 DIAGNOSIS — R296 Repeated falls: Secondary | ICD-10-CM | POA: Diagnosis not present

## 2023-02-02 DIAGNOSIS — I10 Essential (primary) hypertension: Secondary | ICD-10-CM | POA: Diagnosis not present

## 2023-02-02 DIAGNOSIS — F0390 Unspecified dementia without behavioral disturbance: Secondary | ICD-10-CM | POA: Diagnosis not present

## 2023-02-02 DIAGNOSIS — N179 Acute kidney failure, unspecified: Secondary | ICD-10-CM | POA: Diagnosis not present

## 2023-02-02 DIAGNOSIS — D472 Monoclonal gammopathy: Secondary | ICD-10-CM | POA: Diagnosis not present

## 2023-02-02 DIAGNOSIS — Z7689 Persons encountering health services in other specified circumstances: Secondary | ICD-10-CM | POA: Diagnosis not present

## 2023-02-02 DIAGNOSIS — I493 Ventricular premature depolarization: Secondary | ICD-10-CM | POA: Diagnosis not present

## 2023-02-02 DIAGNOSIS — R82998 Other abnormal findings in urine: Secondary | ICD-10-CM | POA: Diagnosis not present

## 2023-02-02 DIAGNOSIS — N390 Urinary tract infection, site not specified: Secondary | ICD-10-CM | POA: Diagnosis not present

## 2023-02-02 DIAGNOSIS — I6381 Other cerebral infarction due to occlusion or stenosis of small artery: Secondary | ICD-10-CM | POA: Diagnosis not present

## 2023-02-02 DIAGNOSIS — R001 Bradycardia, unspecified: Secondary | ICD-10-CM | POA: Diagnosis not present

## 2023-02-02 DIAGNOSIS — I951 Orthostatic hypotension: Secondary | ICD-10-CM | POA: Diagnosis not present

## 2023-02-03 NOTE — Progress Notes (Signed)
PATIENT: Patrick Macias DOB: 1941-05-15  REASON FOR VISIT: Follow up for Dementia HISTORY FROM: Patient, daughter, wife PRIMARY NEUROLOGIST: Dr. Terrace Macias   Assessment and Plan  1.  Dementia 2.  Recent episodes of orthostatic hypotension, falls 3.  Cerebrovascular disease  -Today MMSE 21/30 (24/29 Jul 2022) -Spells of hypotension, bradycardia have resolved, but unclear what led to resolution.  Took 1 dose of Florinef, had bump in creatinine, found to have significant UTI.  Awaiting UTI treatment.  Recommend closely monitoring BP/heart rate.  -Discussion with family, proceed with neuropsychological evaluation to better characterize memory deficits.  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 -For now, we will continue Namenda 10 mg twice daily, Aricept 10 mg AM.  We have continued to discuss reducing Aricept down to 5 mg if dizziness returns/less dreams.  Can continue melatonin at bedtime for sleep.  Continue wearing heart monitor for cardiac evaluation. -Asked his wife to contact us if the dizziness, orthostasis continues.  Be mindful to stay and slowly, stay hydrated, keep log of BP.  Of note, he is not orthostatic today. -Follow-up in 6 months or sooner if needed with Dr. Terrace Macias  HISTORY Patrick Macias is a 82 year old male, accompanied by his daughter Patrick Macias, seen in request by his primary care physician Dr. Rodrigo Macias for evaluation of mental status change, memory loss, generalized weakness, initial evaluation was on 08/01/2020,   I reviewed and summarized the referring note.  Past medical history Hyperlipidemia Prostate hypertrophy Anxiety   He has been working for over 40 years, long his own insurance business, his daughter-in-law works with him, he was noted to have gradual onset of memory loss over the past couple years,  become obvious since September 2021, daughter recorded Mini-Mental Status Examination by his primary care physician was 24/30, he was noted to have slow reaction time, need long time to finish conversation, often stop in the middle of the sentence.   This has become much more obvious since his hospital admission from January 1-13, 2022, before hospital admission, he developed hives on different part of his body, he has to take Zyrtec at home, with no help, was not able to sleep well due to itching rash, on the day he was admitted to the hospital on June 30, 2020, he was found to have low-grade fever, significant confusion, difficulty getting out of the bed, slurred speech, paramedic brought him to the emergency room,   Emergency room, fever, temperature 100.5, C-reactive protein 12.6,   CT angiogram of head and neck showed no significant large vessel disease   Personally personally reviewed MRI of the brain on 07/02/2020, compared to previous MRI in December 2021, in September 2021, no acute abnormality, advanced supratentorium, periventricular, right paramedian pons T2 flair signal abnormality, most consistent with small vessel disease,   Laboratory evaluations: CMP, calcium slightly decreased 8.8, glucose was mildly elevated 108, CBC showed hemoglobin of 11.1, ammonia 16, cyclic citrul peptide antibody were negative, hepatitis B surface antigen, HCV antibody, hepatitis a IgM, hepatitis B C IgM were all negative, pro calcitonin was normal, rheumatoid factor was elevated at 30.5, double-stranded DNA was negative, ANA was negative, CMV PCR, EBV DNA PCR, Lyme titer, varicella-zoster PCR, HIV, vitamin B1, vitamin D, RPR, ammonia, B12, folic acid, TSH, were all normal, A1c was 5.1, ESR was normal 8, negative UDS, the only abnormality was mildly decreased B12  level 221, is on B12 supplement.   CSF study: WBC 1, RBC 2, total protein of 41, glucose of 67, HSV 1/2 PCR negative   Extensive [facility] CSF  encephalopathy-autoimmune panels were negative   He also had a hospital admission on December 3-6, 2021, hospital admission for increased confusion, speech difficulty, unsteady on his feet, fidgety,   During today's interview, patient reported he is back to his baseline, wife and daughter reported that he is 75% back to his baseline, was noted to have significant memory loss, Mini-Mental Status Examination is 99 out of 30, often stopping the middle of the sentence, change subject, slow reaction time,   UPDATE February 11 2021: He is accompanied by his wife at today's visit, his daughter is on the phone, he is closing his business, over the past few months, he was noted to have increased difficulty, difficulty finishing up his symptoms, difficulty following through the conversation, sleeps a lot, he used to be very active, he is no longer motivated, during the day, he watches TV most of the time, take long naps during the day, has good appetite, he can still dress, tolerating, bathing himself, mild unsteady gait  Update July 30, 2021 SS: here today with daughter, Patrick Macias. Doing well, taking Aricept in AM due to nightmares at night. Still on Namenda 10 mg twice daily. Still doing own ADLs, lives with wife Patrick Macias who is patient here. Does the yard work, cleans inside. Driving only short distances. Reports 90%  back to baseline since Aricept was added, within 2 months, PCP couldn't believe the improvement! He shut his business down, but is back to talking with some clients. Most of trouble is with short term memory and word findings. Family can't believe how much better he is. Didn't do memory test today, reportedly traumatic for him. Very engaged, participatory today, laughing.   Update July 30, 2022 SS: here with his wife for joint appointment, doing some short distance driving, goes to doctor by himself. Biggest issue is how to use the phone, remote, has always been an issue. Wife manages his  medications, Aricept 10 mg AM and Namenda 10 mg BID.  Has been cardiology for how HR in 40's, no mention of Aricept as cause, work up has been ok. Wife doesn't want to reduce, has been very helpful for memory. Gets frustrated when on the phone trouble with word finding. I did his MMSE, he was reluctant to perform, has been frustrating he scored 24/30, missed all 3 recall.   Update February 04, 2023 SS: MMSE 21/30. Here with his wife, daughter. Mentions dizziness x 2 months, working up to 3 spells daily. Few falls. Having BP drops, HR drops with spells. Wearing heart monitor now. PCP gave him Florinef last week, last week was told his creatinine had doubled up to 2. Since Florinef has been stopped, rechecked was back to normal, but has significant UTI, waiting for culture before treating.  Last several months dealing with kidney stones, prostate issues, has seen urology.  Short term memory is poor, more trouble following instructions. Some concern about his balance.  He can do his ADLs, his wife worries about leaving him alone.  On waitlist for independent living at [facility]. Doesn't drive any more. He has had a few BP not orthostatic 120/70, 120/80.   REVIEW OF SYSTEMS: Out of a complete 14 system review of symptoms, the patient complains only of the following symptoms, and all other reviewed systems are negative.  See HPI  ALLERGIES: No Known Allergies  HOME MEDICATIONS: Outpatient Medications Prior to Visit  Medication Sig Dispense Refill   acetaminophen (TYLENOL) 500 MG tablet Take 1,000 mg by mouth every 6 (six) hours as needed for headache (pain).     aspirin EC 81 MG tablet Take 1 tablet (81 mg total) by mouth daily. 90 tablet 3   buPROPion (WELLBUTRIN XL) 150 MG 24 hr tablet Take 150 mg by mouth every morning.     carboxymethylcellulose (REFRESH TEARS) 0.5 % SOLN Place 1 drop into both eyes 3 (three) times daily as needed (dry eyes).     cholecalciferol (VITAMIN D3) 25 MCG (1000 UNIT) tablet  Take 1,000 Units by mouth See admin instructions. Take 1000 mg 5 days a week, Sun through [employer]     Cyanocobalamin (B-12) 3000 MCG CAPS Take 3,000 mcg by mouth daily.     donepezil (ARICEPT) 10 MG tablet Take 1 tablet (10 mg total) by mouth at bedtime. 90 tablet 4   escitalopram (LEXAPRO) 20 MG tablet Take 20 mg by mouth every morning.     Melatonin 10 MG CAPS Take 20 mg by mouth at bedtime.     memantine (NAMENDA) 10 MG tablet Take 1 tablet (10 mg total) by mouth 2 (two) times daily. 180 tablet 4   Multiple Vitamin (MULTIVITAMIN) tablet Take 1 tablet by mouth daily.     rosuvastatin (CRESTOR) 20 MG tablet Take 20 mg by mouth daily.     fludrocortisone (FLORINEF) 0.1 MG tablet Take 1 tablet (0.1 mg total) by mouth daily. 90 tablet 3   No facility-administered medications prior to visit.    PAST MEDICAL HISTORY: Past Medical History:  Diagnosis Date   Anxiety    Aortic atherosclerosis (HCC)    BPH (benign prostatic hyperplasia)    Carotid atherosclerosis    Cerumen impaction    Coronary artery calcification    Dyslipidemia    Frequent PVCs    H/O lithotripsy    Hearing decreased, bilateral    Hx of colonic polyps    IFG (impaired fasting glucose)    Macrocytic anemia    Macrocytosis    Memory loss    MGUS (monoclonal gammopathy of unknown significance)    Nephrolithiasis    TIA (transient ischemic attack)    Vitamin D deficiency     PAST SURGICAL HISTORY: Past Surgical History:  Procedure Laterality Date   BALLOON DILATION Bilateral 08/20/2022   Procedure: BALLOON DILATION;  Surgeon: Willis Modena, MD;  Location: [facility];  Service: Gastroenterology;  Laterality: Bilateral;   BIOPSY  08/20/2022   Procedure: BIOPSY;  Surgeon: Willis Modena, MD;  Location: [facility];  Service: Gastroenterology;;   BOTOX INJECTION N/A 08/20/2022   Procedure: BOTOX INJECTION;  Surgeon: Willis Modena, MD;  Location: [facility];  Service: Gastroenterology;  Laterality: N/A;  100  units   CATARACT EXTRACTION, BILATERAL     ESOPHAGEAL MANOMETRY N/A 04/16/2022   Procedure: ESOPHAGEAL MANOMETRY (EM);  Surgeon: Willis Modena, MD;  Location: [facility];  Service: Gastroenterology;  Laterality: N/A;   ESOPHAGOGASTRODUODENOSCOPY (EGD) WITH PROPOFOL Bilateral 08/20/2022   Procedure: ESOPHAGOGASTRODUODENOSCOPY (EGD) WITH PROPOFOL;  Surgeon: Willis Modena, MD;  Location: [facility];  Service: Gastroenterology;  Laterality: Bilateral;   PROSTATECTOMY     Partial    FAMILY HISTORY: Family History  Problem Relation Age of Onset   Leukemia Mother    Breast cancer Mother    Other Father        unknown medical history   Sudden death  Neg Hx    Hypertension Neg Hx    Hyperlipidemia Neg Hx    Diabetes Neg Hx    Heart attack Neg Hx     SOCIAL HISTORY: Social History   Socioeconomic History   Marital status: Married    Spouse name: Harriett Sine   Number of children: 2   Years of education: college   Highest education level: Bachelor's degree (e.g., BA, AB, BS)  Occupational History   Occupation: Insurance   Tobacco Use   Smoking status: Never   Smokeless tobacco: Never  Vaping Use   Vaping status: Never Used  Substance and Sexual Activity   Alcohol use: Not Currently   Drug use: Never   Sexual activity: Not Currently  Other Topics Concern   Not on file  Social History Narrative   Lives at home with his wife.   Left-handed.   Two cups caffeine per day.   Social Determinants of Health   Financial Resource Strain: Not on file  Food Insecurity: Not on file  Transportation Needs: Not on file  Physical Activity: Not on file  Stress: Not on file  Social Connections: Not on file  Intimate Partner Violence: Not on file   PHYSICAL EXAM  Vitals:   02/04/23 0847 02/04/23 0852  BP: (!) 145/73 126/60  Pulse: 63   Weight: 176 lb (79.8 kg)   Height: 5\' 11"  (1.803 m)     Body mass index is 24.55 kg/m.  Generalized: Well developed, in no acute distress      02/11/2021   10:27 AM  Montreal Cognitive Assessment   Visuospatial/ Executive (0/5) 0  Naming (0/3) 3  Attention: Read list of digits (0/2) 0  Attention: Read list of letters (0/1) 0  Attention: Serial 7 subtraction starting at 100 (0/3) 0  Language: Repeat phrase (0/2) 0  Language : Fluency (0/1) 0  Abstraction (0/2) 1  Delayed Recall (0/5) 0  Orientation (0/6) 4  Total 8      02/04/2023    8:47 AM 07/30/2022   10:00 AM 08/01/2020    9:10 AM  MMSE - Mini Mental State Exam  Orientation to time 3 4 5   Orientation to Place 5 5 5   Registration 2 3 3   Attention/ Calculation 2 4 0  Recall 2 0 2  Language- name 2 objects 2 2 2   Language- repeat 1 1 1   Language- follow 3 step command 3 3 3   Language- read & follow direction 1 1 1   Write a sentence 0 1 1  Copy design 0 0 0  Total score 21 24 23    Neurological examination  Mentation: Alert oriented to time, place, history taking. Follows all commands, is whitty, mild word finding trouble.  His wife provides most history. Cranial nerve II-XII: Pupils were equal round reactive to light. Extraocular movements were full, visual field were full on confrontational test. Facial sensation and strength were normal. Head turning and shoulder shrug  were normal and symmetric. Motor: The motor testing reveals 5 over 5 strength of all 4 extremities. Good symmetric motor tone is noted throughout.  No bradykinesia or rigidity were noted. Sensory: Sensory testing is intact to soft touch on all 4 extremities. No evidence of extinction is noted.  Coordination: Cerebellar testing reveals good finger-nose-finger and heel-to-shin bilaterally.  Gait and station: Gait is slightly wide based, left-sided back pain, antalgic with gait, is cautious Reflexes: Deep tendon reflexes are symmetric and normal bilaterally.   DIAGNOSTIC DATA (LABS, IMAGING, TESTING) -  I reviewed patient records, labs, notes, testing and imaging myself where available.  Lab Results   Component Value Date   WBC 6.7 01/30/2023   HGB 11.5 (L) 01/30/2023   HCT 33.0 (L) 01/30/2023   MCV 102 (H) 01/30/2023   PLT 228 01/30/2023      Component Value Date/Time   NA 140 01/30/2023 1549   K 3.9 01/30/2023 1549   CL 106 01/30/2023 1549   CO2 20 01/30/2023 1549   GLUCOSE 103 (H) 01/30/2023 1549   GLUCOSE 108 (H) 05/05/2022 1030   BUN 35 (H) 01/30/2023 1549   CREATININE 2.02 (H) 01/30/2023 1549   CREATININE 0.97 05/05/2022 1030   CALCIUM 9.5 01/30/2023 1549   PROT 7.0 05/05/2022 1030   ALBUMIN 4.2 05/05/2022 1030   AST 37 05/05/2022 1030   ALT 34 05/05/2022 1030   ALKPHOS 38 05/05/2022 1030   BILITOT 1.1 05/05/2022 1030   GFRNONAA >60 05/05/2022 1030   GFRAA  03/21/2008 0430    >60        The eGFR has been calculated using the MDRD equation. This calculation has not been validated in all clinical   Lab Results  Component Value Date   CHOL 86 06/02/2020   HDL 36 (L) 06/02/2020   LDLCALC 32 06/02/2020   TRIG 91 06/02/2020   CHOLHDL 2.4 06/02/2020   Lab Results  Component Value Date   HGBA1C 5.1 06/02/2020   Lab Results  Component Value Date   VITAMINB12 377 04/02/2021   Lab Results  Component Value Date   TSH 0.809 01/30/2023   Margie Ege, AGNP-C, DNP 02/04/2023, 8:55 AM [facility] [address], [address] [place], [place] [address] [phone]

## 2023-02-04 ENCOUNTER — Encounter: Payer: Self-pay | Admitting: Neurology

## 2023-02-04 ENCOUNTER — Ambulatory Visit: Payer: Medicare HMO | Admitting: Neurology

## 2023-02-04 VITALS — BP 126/60 | HR 63 | Ht 71.0 in | Wt 176.0 lb

## 2023-02-04 DIAGNOSIS — F0390 Unspecified dementia without behavioral disturbance: Secondary | ICD-10-CM | POA: Diagnosis not present

## 2023-02-04 DIAGNOSIS — I679 Cerebrovascular disease, unspecified: Secondary | ICD-10-CM

## 2023-02-04 DIAGNOSIS — F03918 Unspecified dementia, unspecified severity, with other behavioral disturbance: Secondary | ICD-10-CM

## 2023-02-04 DIAGNOSIS — D472 Monoclonal gammopathy: Secondary | ICD-10-CM | POA: Diagnosis not present

## 2023-02-04 MED ORDER — DONEPEZIL HCL 10 MG PO TABS: 10.0000 mg | ORAL_TABLET | Freq: Every day | ORAL | 4 refills | Status: DC

## 2023-02-04 MED ORDER — MEMANTINE HCL 10 MG PO TABS: 10.0000 mg | ORAL_TABLET | Freq: Two times a day (BID) | ORAL | 4 refills | Status: DC

## 2023-02-04 NOTE — Progress Notes (Signed)
Chart reviewed, agree above plan ?

## 2023-02-04 NOTE — Patient Instructions (Signed)
Referral to neuropsychological evaluation  Continue to wear heart monitor  Monitor BP, HR May consider reducing Aricept to 5 mg daily, continue Namenda 10 mg twice daily Call if falls continue

## 2023-02-09 ENCOUNTER — Encounter: Payer: Self-pay | Admitting: Internal Medicine

## 2023-02-09 ENCOUNTER — Telehealth: Payer: Self-pay | Admitting: Neurology

## 2023-02-09 ENCOUNTER — Telehealth: Payer: Self-pay | Admitting: Internal Medicine

## 2023-02-09 DIAGNOSIS — K5730 Diverticulosis of large intestine without perforation or abscess without bleeding: Secondary | ICD-10-CM | POA: Diagnosis not present

## 2023-02-09 DIAGNOSIS — M5136 Other intervertebral disc degeneration, lumbar region: Secondary | ICD-10-CM | POA: Diagnosis not present

## 2023-02-09 DIAGNOSIS — M545 Low back pain, unspecified: Secondary | ICD-10-CM | POA: Diagnosis not present

## 2023-02-09 DIAGNOSIS — N281 Cyst of kidney, acquired: Secondary | ICD-10-CM | POA: Diagnosis not present

## 2023-02-09 DIAGNOSIS — N200 Calculus of kidney: Secondary | ICD-10-CM | POA: Diagnosis not present

## 2023-02-09 NOTE — Telephone Encounter (Signed)
Referral for neuropsychology sent to [facility]. Phone: [phone], Fax: [phone].

## 2023-02-09 NOTE — Telephone Encounter (Signed)
Called spouse advised that Lorin Picket, NP with [facility] ordered heart monitor.  I can not determine how long pt is to wear monitor.  Reports pt is carrying around a phone with him.  Advised per our office that normally means a 30 day monitor.  However, to be for certain advised to f/u with ordering provider.  Spouse had no further concerns.

## 2023-02-09 NOTE — Telephone Encounter (Signed)
Patient's wife is calling because the patient has had a heart monitor on for a week now and was unsure when to take it off. Please advise.

## 2023-02-10 ENCOUNTER — Encounter: Payer: Self-pay | Admitting: Physician Assistant

## 2023-02-10 DIAGNOSIS — M47816 Spondylosis without myelopathy or radiculopathy, lumbar region: Secondary | ICD-10-CM | POA: Diagnosis not present

## 2023-02-10 NOTE — Telephone Encounter (Signed)
Error

## 2023-02-11 ENCOUNTER — Ambulatory Visit: Payer: Medicare HMO | Attending: Family Medicine

## 2023-02-11 DIAGNOSIS — R001 Bradycardia, unspecified: Secondary | ICD-10-CM

## 2023-02-16 ENCOUNTER — Ambulatory Visit: Payer: Medicare HMO | Admitting: Physician Assistant

## 2023-02-16 DIAGNOSIS — M5136 Other intervertebral disc degeneration, lumbar region: Secondary | ICD-10-CM | POA: Diagnosis not present

## 2023-02-16 DIAGNOSIS — M47816 Spondylosis without myelopathy or radiculopathy, lumbar region: Secondary | ICD-10-CM | POA: Diagnosis not present

## 2023-02-16 DIAGNOSIS — M5137 Other intervertebral disc degeneration, lumbosacral region: Secondary | ICD-10-CM | POA: Diagnosis not present

## 2023-02-16 DIAGNOSIS — M48061 Spinal stenosis, lumbar region without neurogenic claudication: Secondary | ICD-10-CM | POA: Diagnosis not present

## 2023-02-16 DIAGNOSIS — M5126 Other intervertebral disc displacement, lumbar region: Secondary | ICD-10-CM | POA: Diagnosis not present

## 2023-02-18 ENCOUNTER — Telehealth: Payer: Self-pay

## 2023-02-18 ENCOUNTER — Ambulatory Visit: Payer: Medicare HMO | Admitting: Internal Medicine

## 2023-02-18 NOTE — Telephone Encounter (Signed)
Records faxed pt living facility

## 2023-02-23 DIAGNOSIS — D649 Anemia, unspecified: Secondary | ICD-10-CM | POA: Diagnosis not present

## 2023-02-23 DIAGNOSIS — E785 Hyperlipidemia, unspecified: Secondary | ICD-10-CM | POA: Diagnosis not present

## 2023-02-23 DIAGNOSIS — I10 Essential (primary) hypertension: Secondary | ICD-10-CM | POA: Diagnosis not present

## 2023-02-23 LAB — COMPLETE METABOLIC PANEL WITH GFR: EGFR: 53.3

## 2023-02-23 NOTE — Progress Notes (Unsigned)
Cardiology Office Note    Date:  02/25/2023  ID:  Patrick Macias, Patrick Macias 11/14/1940, MRN [id number] PCP:  Rodrigo Ran, MD  Cardiologist:  Lesleigh Noe, MD (Inactive)  Electrophysiologist:  Maurice Small, MD   Chief Complaint: f/u orthostasis  History of Present Illness: .    Patrick Macias is a 82 y.o. male with visit-pertinent history of coronary calcification with last negative nuc 06/2022, right carotid bruit with only mild carotid artery disease, memory loss, anxiety, BPH, HTN, colon polyps, nephrolithasis, vit D deficiency, macrocytic anemia, MGUS, TIA, frequent PVCs, occasional PACs, NSVT, mild-moderate AI, mild MR who is seen for follow-up.   He had a prior calcium score 01/2019 with CAC 846 (75%ile), aortic atherosclerosis, followed by normal stress test 05/2019. Echocardiogram 05/2020 showed EF 60-65%, moderate RVE obtained during admission for possible TIA for which he was treated with ASA + Plavix for 3 weeks followed by ASA monotherapy. There was concern that this was either TIA versus related to underlying cognitive impairment. CT angio head/neck showed no large vessel occlusion (20-30% prox R ICA and left carotid bulb). He was evaluated 02/2022 for HR registering 30s-40s at times on pulse ox. His EKG showed frequent PVCs likely not picking up. 3 day monitor completed 04/2022 showed avg HR 65bpm, minimum 46bpm, 28% PVC burden, 12 beats NSVT, 17 episodes of NSVT longest of 6 beats, prompting Toprol 25mg  daily and EP eval. Echo 03/2022 showed EF 50-55%, mildly enlarged RV, mild-moderate AI, mild MR. Nuclear stress test done 06/2022 to exclude ischemia as cause of ectopy was negative for ischemia. He saw Dr. Nelly Laurence 06/2022 who switched metoprolol to propranolol and added magnesium. PVCs seemed to have resolved by 08/2022 EP f/u. He was re-evaluated here 01/30/23 for marked orthostasis. Primary care had stopped propranolol due to concern for bradycardia and hypotension, and added  fludrocortisone every other day with significant improvement in symptoms. He was still orthostatic at 01/30/23 OV with [place] so fludrocortisone was increased from every other day to daily. However, labs came back showing new AKI of unclear cause with Cr of 2, so recommended to go to ER, and he declined, preferring PCP follow-up instead. Repeat monitor requested by Dr. Nelly Laurence showed average HR 51bpm, ranging 46-106bpm, 11% PVCs, 3% PACs, one 6beat run NSVT, one 4 beat run NSVT. Repeat echo was done yesterday showing EF 50-55%, normal RV function, mild RVE, trivial MR, moderate LAE, mild-moderate AI.  He is seen for follow-up today with his wife. As in previous visits, she provides majority of the history and [name] is on the phone. He is cheerful and in good spirits. They report that he had repeat labs/eval by PCP after last OV and was treated for UTI. They report that PCP discontinued fludrocortisone. He was doing reasonably well from a dizziness standpoint off the fludrocortisone until he had another episode of dizziness yesterday stereotypical of what was happening earlier this summer associated with significant BP drop. They thought primary care rechecked kidney function but I do not see BMET since the one we did at our OV. They report labs done Monday as well, perhaps not crossing over. We do have a copy of a UPEP that was scanned in where Dr. Waynard Edwards had relayed results to Dr. Leonides Schanz (?concern for multiple myeloma, M spike listed as 0.5). He has not had any CP or palpitations, but his wife has noticed he's had increasing DOE over the last few weeks. He has not had any orthopnea, LEE  edema, syncope, fevers, chills, pleuritic chest pain, or cough.   Orthostatics today: Lying BP 116/62, HR 55 Sitting BP 116/70, HR 57 Standing BP 112/66, HR 62 Standing 59m BP 110/52, HR 66   Labwork independently reviewed: 01/30/23 TSH wnl, Hgb 11.5, plt 228, K 3.9, Cr 2.02 04/2022 Hgb 13.4, plt 209, K 4.2, Cr 0.97,  LFTs ok 02/2022 TSH wnl, Mg 2.4     ROS: .    Please see the history of present illness All other systems are reviewed and otherwise negative.  Studies Reviewed: Marland Kitchen    EKG:  EKG is ordered today, personally reviewed, demonstrating NSR 60bpm, baseline wander, nonspecific STTW changes, no PVCs  CV Studies: Cardiac studies reviewed are outlined and summarized above. Otherwise please see EMR for full report.   Current Reported Medications:.    Current Meds  Medication Sig   acetaminophen (TYLENOL) 500 MG tablet Take 1,000 mg by mouth every 6 (six) hours as needed for headache (pain).   aspirin EC 81 MG tablet Take 1 tablet (81 mg total) by mouth daily.   buPROPion (WELLBUTRIN XL) 150 MG 24 hr tablet Take 150 mg by mouth every morning.   carboxymethylcellulose (REFRESH TEARS) 0.5 % SOLN Place 1 drop into both eyes 3 (three) times daily as needed (dry eyes).   cholecalciferol (VITAMIN D3) 25 MCG (1000 UNIT) tablet Take 1,000 Units by mouth See admin instructions. Take 1000 mg 5 days a week, Sun through [employer]   Cyanocobalamin (B-12) 3000 MCG CAPS Take 3,000 mcg by mouth daily.   donepezil (ARICEPT) 10 MG tablet Take 1 tablet (10 mg total) by mouth at bedtime.   escitalopram (LEXAPRO) 20 MG tablet Take 20 mg by mouth every morning.   Melatonin 10 MG CAPS Take 20 mg by mouth at bedtime.   memantine (NAMENDA) 10 MG tablet Take 1 tablet (10 mg total) by mouth 2 (two) times daily.   Multiple Vitamin (MULTIVITAMIN) tablet Take 1 tablet by mouth daily.   rosuvastatin (CRESTOR) 20 MG tablet Take 20 mg by mouth daily.    Physical Exam:    VS:  BP 116/62 (BP Location: Left Arm, Cuff Size: Normal)   Pulse (!) 55   Ht 5\' 11"  (1.803 m)   Wt 177 lb 6.4 oz (80.5 kg)   SpO2 98%   BMI 24.74 kg/m    Wt Readings from Last 3 Encounters:  02/25/23 177 lb 6.4 oz (80.5 kg)  02/04/23 176 lb (79.8 kg)  01/30/23 176 lb 6.4 oz (80 kg)    GEN: Well nourished, well developed in no acute distress NECK: No  JVD; No carotid bruits CARDIAC: RRR, no murmurs, rubs, gallops RESPIRATORY:  Clear to auscultation without rales, wheezing or rhonchi  ABDOMEN: Soft, non-tender, non-distended EXTREMITIES:  No edema; No acute deformity   Asessement and Plan:.    1. Orthostatic hypotension - etiology somewhat unclear. He initially had improvement with fludrocortisone then discontinued by PCP per patient's wife. Labs earlier this month had shown significant AKI of unclear etiology. I had recommended ED eval and they did not wish to go. They report kidney function was subsequently repeated by primary care but I do not see these available. We will call Dr. Laurey Morale office to get a copy of recent bloodwork. Recent TSH wnl. He was doing alright off Florinef until he had a recurrent episode of dizziness yesterday. He is not acutely orthostatic today but BP is low-normal so I would recommend to start trial of midodrine 2.5mg  BID. He  is not tachycardic, tachypneic, or hypoxic and has no signs of VTE on exam or pleuritic chest pain. I am concerned if he does have evidence of myeloma that there is a component of autonomic neuropathy. Recommended to f/u Dr. Leonides Schanz as well.   2. DOE - unclear etiology. No acute ischemic changes on EKG. Exam benign. Echo unchanged. Previous nuclear stress test reassuring. Do not suspect this is from his PVCs as he previously had a higher burden in 2023 with zero symptoms. Check CBC, CMET, BNP, 2V CXR today. Recommend primary care/heme-onc follow-up as well. Will see how he does with midodrine. In the event that this is somehow related to chronotropic incompetence, will have him f/u with EP as well  - Dr. Nelly Laurence was also treating his frequent PVCs but he is now off BB therapy due to #1.  3. AKI - recheck today.  4. Frequent PVCs/NSVT - remains off beta blocker due to orthostatic hypotension. Does have evidence of acceptable baseline sinus bradycardia but thankfully no advanced heart block or  significant drops in HR. Follow up with EP given clinical updates. See #3 as well.  5. Mild-moderate AI, trace MR - stable, follow clinically, consider repeat echo in 1 year.  6. Coronary artery calcification - no recent anginal symptoms. Some DOE but not convinced this is ischemic in etiology acutely. With AKI, would be hesitant to pursue any sort of coronary evaluation involving dye either. Continue ASA and statin. Lipids managed by PCP.  7. Bilateral carotid stenosis - mild disease on duplex 04/2022. Remains on ASA, statin with lipids followed by PCP. Consider repeat study in 2 years (03/2024) if clinically appropriate. Would not expect this to be contributing to his issues above.   Addendum 02/25/2023 4:38pm - received updated fax from Dr. Laurey Morale office. Labs from 02/02/23 showed Cr 1.6, then 1.3 on 02/23/23. LFTs/B12 were normal and Hgb 11.1 02/23/23. Iron studies were reassuring. 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     Disposition: F/u with me in 3-4 weeks. Also f/u Dr. Nelly Laurence given PVC management, baseline bradycardia, discontinuation of BB earlier this summer.  Signed, Laurann Montana, PA-C

## 2023-02-24 ENCOUNTER — Ambulatory Visit (HOSPITAL_COMMUNITY): Payer: Medicare HMO | Attending: Cardiology

## 2023-02-24 DIAGNOSIS — I493 Ventricular premature depolarization: Secondary | ICD-10-CM | POA: Insufficient documentation

## 2023-02-24 DIAGNOSIS — I6523 Occlusion and stenosis of bilateral carotid arteries: Secondary | ICD-10-CM | POA: Insufficient documentation

## 2023-02-24 DIAGNOSIS — I351 Nonrheumatic aortic (valve) insufficiency: Secondary | ICD-10-CM | POA: Insufficient documentation

## 2023-02-24 DIAGNOSIS — I2510 Atherosclerotic heart disease of native coronary artery without angina pectoris: Secondary | ICD-10-CM | POA: Insufficient documentation

## 2023-02-24 DIAGNOSIS — I951 Orthostatic hypotension: Secondary | ICD-10-CM | POA: Insufficient documentation

## 2023-02-24 DIAGNOSIS — I4729 Other ventricular tachycardia: Secondary | ICD-10-CM | POA: Diagnosis not present

## 2023-02-24 DIAGNOSIS — I2584 Coronary atherosclerosis due to calcified coronary lesion: Secondary | ICD-10-CM | POA: Diagnosis not present

## 2023-02-24 DIAGNOSIS — I340 Nonrheumatic mitral (valve) insufficiency: Secondary | ICD-10-CM | POA: Diagnosis not present

## 2023-02-24 LAB — ECHOCARDIOGRAM COMPLETE
AR max vel: 2.88 cm2
AV Area VTI: 3.1 cm2
AV Area mean vel: 2.73 cm2
AV Mean grad: 4 mmHg
AV Peak grad: 7 mmHg
Ao pk vel: 1.33 m/s
Area-P 1/2: 2.9 cm2
P 1/2 time: 547 msec
S' Lateral: 2.9 cm

## 2023-02-25 ENCOUNTER — Encounter: Payer: Self-pay | Admitting: Physician Assistant

## 2023-02-25 ENCOUNTER — Ambulatory Visit: Payer: Medicare HMO | Attending: Physician Assistant | Admitting: Physician Assistant

## 2023-02-25 ENCOUNTER — Ambulatory Visit: Admission: RE | Admit: 2023-02-25 | Payer: Medicare HMO | Source: Ambulatory Visit

## 2023-02-25 VITALS — BP 116/62 | HR 55 | Ht 71.0 in | Wt 177.4 lb

## 2023-02-25 DIAGNOSIS — I6523 Occlusion and stenosis of bilateral carotid arteries: Secondary | ICD-10-CM | POA: Diagnosis not present

## 2023-02-25 DIAGNOSIS — I2584 Coronary atherosclerosis due to calcified coronary lesion: Secondary | ICD-10-CM | POA: Diagnosis not present

## 2023-02-25 DIAGNOSIS — I493 Ventricular premature depolarization: Secondary | ICD-10-CM | POA: Diagnosis not present

## 2023-02-25 DIAGNOSIS — I340 Nonrheumatic mitral (valve) insufficiency: Secondary | ICD-10-CM | POA: Diagnosis not present

## 2023-02-25 DIAGNOSIS — I951 Orthostatic hypotension: Secondary | ICD-10-CM | POA: Diagnosis not present

## 2023-02-25 DIAGNOSIS — I351 Nonrheumatic aortic (valve) insufficiency: Secondary | ICD-10-CM

## 2023-02-25 DIAGNOSIS — R0609 Other forms of dyspnea: Secondary | ICD-10-CM | POA: Diagnosis not present

## 2023-02-25 DIAGNOSIS — I2510 Atherosclerotic heart disease of native coronary artery without angina pectoris: Secondary | ICD-10-CM | POA: Diagnosis not present

## 2023-02-25 DIAGNOSIS — N179 Acute kidney failure, unspecified: Secondary | ICD-10-CM

## 2023-02-25 DIAGNOSIS — I4729 Other ventricular tachycardia: Secondary | ICD-10-CM

## 2023-02-25 MED ORDER — MIDODRINE HCL 2.5 MG PO TABS: 2.5000 mg | ORAL_TABLET | Freq: Two times a day (BID) | ORAL | 3 refills | Status: DC

## 2023-02-25 NOTE — Patient Instructions (Signed)
Medication Instructions:  Your physician has recommended you make the following change in your medication:  START: midodrine 2.5 mg by mouth twice daily with meal  *If you need a refill on your cardiac medications before your next appointment, please call your pharmacy*   Lab Work: CMP, BNP, CBC  If you have labs (blood work) drawn today and your tests are completely normal, you will receive your results only by: MyChart Message (if you have MyChart) OR A paper copy in the mail If you have any lab test that is abnormal or we need to change your treatment, we will call you to review the results.   Testing/Procedures: Your provider has requested that you have an office visit with Dr. Nelly Laurence next available.    Your provider has requested that you have a chest X-RAY   Chest X-ray Instructions:    1. You may have this done at the [facility], located in the [facility] on the 1st floor.    2. You do no have to have an appointment.    3. [address] [place], [place] [address]        [phone]        Monday - Friday  8:00 am - 5:00 pm    Follow-Up: At [facility], you and your health needs are our priority.  As part of our continuing mission to provide you with exceptional heart care, we have created designated Provider Care Teams.  These Care Teams include your primary Cardiologist (physician) and Advanced Practice Providers (APPs -  Physician Assistants and Nurse Practitioners) who all work together to provide you with the care you need, when you need it.    Your next appointment:   3-4 week(s)  Provider:   Ronie Spies, PA-C      Other Instructions Your provider is recommending that you have a follow up visit with Dr. Leonides Schanz.

## 2023-02-26 ENCOUNTER — Telehealth: Payer: Self-pay

## 2023-02-26 DIAGNOSIS — R0609 Other forms of dyspnea: Secondary | ICD-10-CM

## 2023-02-26 DIAGNOSIS — I351 Nonrheumatic aortic (valve) insufficiency: Secondary | ICD-10-CM

## 2023-02-26 DIAGNOSIS — I4729 Other ventricular tachycardia: Secondary | ICD-10-CM

## 2023-02-26 DIAGNOSIS — I340 Nonrheumatic mitral (valve) insufficiency: Secondary | ICD-10-CM

## 2023-02-26 LAB — CBC
Hematocrit: 32.1 % — ABNORMAL LOW (ref 37.5–51.0)
Hemoglobin: 10.7 g/dL — ABNORMAL LOW (ref 13.0–17.7)
MCH: 34.7 pg — ABNORMAL HIGH (ref 26.6–33.0)
MCHC: 33.3 g/dL (ref 31.5–35.7)
MCV: 104 fL — ABNORMAL HIGH (ref 79–97)
Platelets: 216 10*3/uL (ref 150–450)
RBC: 3.08 x10E6/uL — ABNORMAL LOW (ref 4.14–5.80)
RDW: 12.9 % (ref 11.6–15.4)
WBC: 5.1 10*3/uL (ref 3.4–10.8)

## 2023-02-26 LAB — COMPREHENSIVE METABOLIC PANEL
ALT: 32 IU/L (ref 0–44)
AST: 29 IU/L (ref 0–40)
Albumin: 4.4 g/dL (ref 3.8–4.8)
Alkaline Phosphatase: 43 IU/L — ABNORMAL LOW (ref 44–121)
BUN/Creatinine Ratio: 25 — ABNORMAL HIGH (ref 10–24)
BUN: 26 mg/dL (ref 8–27)
Bilirubin Total: 0.7 mg/dL (ref 0.0–1.2)
CO2: 23 mmol/L (ref 20–29)
Calcium: 9.2 mg/dL (ref 8.6–10.2)
Chloride: 107 mmol/L — ABNORMAL HIGH (ref 96–106)
Creatinine, Ser: 1.04 mg/dL (ref 0.76–1.27)
Globulin, Total: 2.5 g/dL (ref 1.5–4.5)
Glucose: 99 mg/dL (ref 70–99)
Potassium: 4.6 mmol/L (ref 3.5–5.2)
Sodium: 141 mmol/L (ref 134–144)
Total Protein: 6.9 g/dL (ref 6.0–8.5)
eGFR: 73 mL/min/{1.73_m2} (ref >59)

## 2023-02-26 LAB — PRO B NATRIURETIC PEPTIDE: NT-Pro BNP: 389 pg/mL (ref 0–486)

## 2023-02-26 NOTE — Telephone Encounter (Signed)
The patient has been notified of the result and verbalized understanding.  All questions (if any) were answered. Frutoso Schatz, RN 02/26/2023 8:54 AM  Cardiac MRI has been ordered.

## 2023-02-26 NOTE — Telephone Encounter (Signed)
-----   Message from Laurann Montana sent at 02/26/2023  8:46 AM EDT ----- Note: patient has dementia and his wife assistance with care. Please let them know that kidney function is back to baseline. His anemia is slightly worse than before. I recommend he follow up with Dr Leonides Schanz for evaluation. I also discussed his case with one of our doctors, Dr Gala Romney, given the constellation of symptoms with the shortness of breath, dizziness, dropping BP, PVCs. One of the things we would like to rule out is a heart condition called amyloid which can sometimes along with MGUS. Recommend setting up for cardiac MRI. If he is claustrophobic, let me know and I will send in Valium 5mg  1 hour prior to procedure. (It takes about 45 minutes to get all the pictures.)

## 2023-03-04 ENCOUNTER — Telehealth: Payer: Self-pay | Admitting: Hematology and Oncology

## 2023-03-04 ENCOUNTER — Telehealth: Payer: Self-pay | Admitting: Internal Medicine

## 2023-03-04 NOTE — Telephone Encounter (Signed)
Pt c/o medication issue:  1. Name of Medication: midodrine (PROAMATINE) 2.5 MG tablet   2. How are you currently taking this medication (dosage and times per day)?    3. Are you having a reaction (difficulty breathing--STAT)? no  4. What is your medication issue? Wife called to say the medication hasn't help. Causing patient dizziness. Would like to know if patient can just take it, in the evening time instead of twice a day.

## 2023-03-04 NOTE — Telephone Encounter (Signed)
Called spoke with pt spouse ok per DPR.  Reports pt started taking midodrine on Saturday took med as ordered until yesterday.  Did not give AM dose of midodrine today.   Reports pt has had x2 episodes of dizziness and leg weakness.  Will give PM dose.  Has not checked BP.  Advised to check BP prior to giving PM dose of midodrine and send in through my chart.  Reports does not know Password I have reset for pt. Reports they have a lot going on are due to move into [place] in November along with pt appointments feels overwhelmed.  Advised will send concern to provider to address if once daily dosing is appropriate.

## 2023-03-05 ENCOUNTER — Other Ambulatory Visit: Payer: Self-pay

## 2023-03-05 ENCOUNTER — Inpatient Hospital Stay: Payer: Medicare HMO | Attending: Hematology and Oncology

## 2023-03-05 DIAGNOSIS — D472 Monoclonal gammopathy: Secondary | ICD-10-CM | POA: Diagnosis not present

## 2023-03-05 LAB — CBC WITH DIFFERENTIAL (CANCER CENTER ONLY)
Abs Immature Granulocytes: 0.01 10*3/uL (ref 0.00–0.07)
Basophils Absolute: 0 10*3/uL (ref 0.0–0.1)
Basophils Relative: 1 %
Eosinophils Absolute: 0.2 10*3/uL (ref 0.0–0.5)
Eosinophils Relative: 5 %
HCT: 33.8 % — ABNORMAL LOW (ref 39.0–52.0)
Hemoglobin: 11.2 g/dL — ABNORMAL LOW (ref 13.0–17.0)
Immature Granulocytes: 0 %
Lymphocytes Relative: 41 %
Lymphs Abs: 1.6 10*3/uL (ref 0.7–4.0)
MCH: 35.3 pg — ABNORMAL HIGH (ref 26.0–34.0)
MCHC: 33.1 g/dL (ref 30.0–36.0)
MCV: 106.6 fL — ABNORMAL HIGH (ref 80.0–100.0)
Monocytes Absolute: 0.5 10*3/uL (ref 0.1–1.0)
Monocytes Relative: 12 %
Neutro Abs: 1.6 10*3/uL — ABNORMAL LOW (ref 1.7–7.7)
Neutrophils Relative %: 41 %
Platelet Count: 179 10*3/uL (ref 150–400)
RBC: 3.17 MIL/uL — ABNORMAL LOW (ref 4.22–5.81)
RDW: 13.3 % (ref 11.5–15.5)
WBC Count: 3.9 10*3/uL — ABNORMAL LOW (ref 4.0–10.5)
nRBC: 0 % (ref 0.0–0.2)

## 2023-03-05 LAB — CMP (CANCER CENTER ONLY)
ALT: 24 U/L (ref 0–44)
AST: 27 U/L (ref 15–41)
Albumin: 4.3 g/dL (ref 3.5–5.0)
Alkaline Phosphatase: 36 U/L — ABNORMAL LOW (ref 38–126)
Anion gap: 5 (ref 5–15)
BUN: 23 mg/dL (ref 8–23)
CO2: 28 mmol/L (ref 22–32)
Calcium: 9.1 mg/dL (ref 8.9–10.3)
Chloride: 108 mmol/L (ref 98–111)
Creatinine: 1.12 mg/dL (ref 0.61–1.24)
GFR, Estimated: >60 mL/min (ref >60)
Glucose, Bld: 99 mg/dL (ref 70–99)
Potassium: 4 mmol/L (ref 3.5–5.1)
Sodium: 141 mmol/L (ref 135–145)
Total Bilirubin: 0.9 mg/dL (ref 0.3–1.2)
Total Protein: 7 g/dL (ref 6.5–8.1)

## 2023-03-05 LAB — LACTATE DEHYDROGENASE: LDH: 170 U/L (ref 98–192)

## 2023-03-05 NOTE — Telephone Encounter (Signed)
Spoke with pt's wife, DPR and advised of all information below per Ronie Spies, PA-C.  Pt's wife states pt has seen neurologist who states Aricept may have to be decreased as this can cause dizziness overtime.  Pt's wife states she will try to obtain vital signs if another episode happens since they are intermittent.  She states they usually occur when pt is getting out of the car and going into the house.  She also reports pt continues to get "winded" but is glad the Xray is normal.  She will make a decision regarding Midodrine.  Will forward to Ronie Spies, PA-C for further review.  Pt's wife verbalizes understanding of all information discussed and thanked Charity fundraiser for calling.

## 2023-03-05 NOTE — Telephone Encounter (Signed)
Noted, await update on vitals. Continue plan for cMRI as previously recommended.

## 2023-03-06 LAB — KAPPA/LAMBDA LIGHT CHAINS
Kappa free light chain: 33.7 mg/L — ABNORMAL HIGH (ref 3.3–19.4)
Kappa, lambda light chain ratio: 2.42 — ABNORMAL HIGH (ref 0.26–1.65)
Lambda free light chains: 13.9 mg/L (ref 5.7–26.3)

## 2023-03-08 LAB — MULTIPLE MYELOMA PANEL, SERUM
Albumin SerPl Elph-Mcnc: 4 g/dL (ref 2.9–4.4)
Albumin/Glob SerPl: 1.5 (ref 0.7–1.7)
Alpha 1: 0.2 g/dL (ref 0.0–0.4)
Alpha2 Glob SerPl Elph-Mcnc: 0.5 g/dL (ref 0.4–1.0)
B-Globulin SerPl Elph-Mcnc: 0.7 g/dL (ref 0.7–1.3)
Gamma Glob SerPl Elph-Mcnc: 1.3 g/dL (ref 0.4–1.8)
Globulin, Total: 2.8 g/dL (ref 2.2–3.9)
IgA: 82 mg/dL (ref 61–437)
IgG (Immunoglobin G), Serum: 1349 mg/dL (ref 603–1613)
IgM (Immunoglobulin M), Srm: 68 mg/dL (ref 15–143)
M Protein SerPl Elph-Mcnc: 0.8 g/dL — ABNORMAL HIGH
Total Protein ELP: 6.8 g/dL (ref 6.0–8.5)

## 2023-03-09 ENCOUNTER — Telehealth: Payer: Self-pay | Admitting: Hematology and Oncology

## 2023-03-09 DIAGNOSIS — M47816 Spondylosis without myelopathy or radiculopathy, lumbar region: Secondary | ICD-10-CM | POA: Diagnosis not present

## 2023-03-11 ENCOUNTER — Inpatient Hospital Stay: Payer: Medicare HMO | Admitting: Physician Assistant

## 2023-03-11 ENCOUNTER — Ambulatory Visit: Payer: Medicare HMO | Admitting: Hematology and Oncology

## 2023-03-11 VITALS — BP 117/63 | HR 56 | Temp 97.5°F | Resp 18 | Wt 177.0 lb

## 2023-03-11 DIAGNOSIS — D472 Monoclonal gammopathy: Secondary | ICD-10-CM | POA: Diagnosis not present

## 2023-03-11 DIAGNOSIS — R339 Retention of urine, unspecified: Secondary | ICD-10-CM | POA: Diagnosis not present

## 2023-03-11 DIAGNOSIS — R972 Elevated prostate specific antigen [PSA]: Secondary | ICD-10-CM | POA: Diagnosis not present

## 2023-03-11 NOTE — Progress Notes (Signed)
[facility] [phone]   [phone]  PROGRESS NOTE  Patient Care Team: Rodrigo Ran, MD as PCP - General (Internal Medicine) Lyn Records, MD (Inactive) as PCP - Cardiology (Cardiology) Mealor, Roberts Gaudy, MD as PCP - Electrophysiology (Cardiology) Jaci Standard, MD as Consulting Physician (Hematology and Oncology)  Hematological/Oncological History 1) Labs from PCP, Dr. Myer Haff from [facility].  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   2) 12/28/2020: Establish care with Georga Kaufmann PA-C  3) 01/18/2021: Bone marrow biopsy: Normocellular marrow with mild polytypic plasmocytosis.   CHIEF COMPLAINTS/PURPOSE OF CONSULTATION:  "Monoclonal gammopathy of unknown significance "  HISTORY OF PRESENTING ILLNESS:  Patrick Macias 82 y.o. male returns today for a follow up for monoclonal gammopathy.  Patient is accompanied by his wife for this visit.  On exam today, Mr. Pamplona reports his energy is fairly stable. He has noticed over the last few months that he has shortness of breath with exertion. He denies nausea, vomiting or bowel habit changes. He denies easy bruising or signs of bleeding. He reports dizziness mainly with position changes. He needs to pause for a few seconds which minimizes the dizziness.  He is not currently having any issues with new bone or back pain.  He denies any fevers, chills, night sweats, shortness of breath, chest pain, cough, rash, headaches. He has no other complaints.  Rest of the 10 point ROS is below.  MEDICAL HISTORY:  Past Medical History:  Diagnosis Date   Anxiety    Aortic atherosclerosis (HCC)    BPH (benign prostatic hyperplasia)    Carotid atherosclerosis    Cerumen impaction    Coronary artery calcification    Dyslipidemia    Frequent PVCs    H/O lithotripsy    Hearing decreased, bilateral    Hx of colonic polyps     IFG (impaired fasting glucose)    Macrocytic anemia    Macrocytosis    Memory loss    MGUS (monoclonal gammopathy of unknown significance)    Nephrolithiasis    TIA (transient ischemic attack)    Vitamin D deficiency     SURGICAL HISTORY: Past Surgical History:  Procedure Laterality Date   BALLOON DILATION Bilateral 08/20/2022   Procedure: BALLOON DILATION;  Surgeon: Willis Modena, MD;  Location: [facility];  Service: Gastroenterology;  Laterality: Bilateral;   BIOPSY  08/20/2022   Procedure: BIOPSY;  Surgeon: Willis Modena, MD;  Location: [facility];  Service: Gastroenterology;;   BOTOX INJECTION N/A 08/20/2022   Procedure: BOTOX INJECTION;  Surgeon: Willis Modena, MD;  Location: [facility];  Service: Gastroenterology;  Laterality: N/A;  100 units   CATARACT EXTRACTION, BILATERAL     ESOPHAGEAL MANOMETRY N/A 04/16/2022   Procedure: ESOPHAGEAL MANOMETRY (EM);  Surgeon: Willis Modena, MD;  Location: [facility];  Service: Gastroenterology;  Laterality: N/A;   ESOPHAGOGASTRODUODENOSCOPY (EGD) WITH PROPOFOL Bilateral 08/20/2022   Procedure: ESOPHAGOGASTRODUODENOSCOPY (EGD) WITH PROPOFOL;  Surgeon: Willis Modena, MD;  Location: [facility];  Service: Gastroenterology;  Laterality: Bilateral;   PROSTATECTOMY     Partial    SOCIAL HISTORY: Social History   Socioeconomic History   Marital status: Married    Spouse name: Harriett Sine   Number of children: 2   Years of education: college   Highest education level: Bachelor's degree (e.g., BA, AB, BS)  Occupational History   Occupation: Insurance   Tobacco Use   Smoking status: Never  Smokeless tobacco: Never  Vaping Use   Vaping status: Never Used  Substance and Sexual Activity   Alcohol use: Not Currently   Drug use: Never   Sexual activity: Not Currently  Other Topics Concern   Not on file  Social History Narrative   Lives at home with his wife.   Left-handed.   Two cups caffeine per day.   Social  Determinants of Health   Financial Resource Strain: Not on file  Food Insecurity: Not on file  Transportation Needs: Not on file  Physical Activity: Not on file  Stress: Not on file  Social Connections: Not on file  Intimate Partner Violence: Not on file    FAMILY HISTORY: Family History  Problem Relation Age of Onset   Leukemia Mother    Breast cancer Mother    Other Father        unknown medical history   Sudden death Neg Hx    Hypertension Neg Hx    Hyperlipidemia Neg Hx    Diabetes Neg Hx    Heart attack Neg Hx     ALLERGIES:  has No Known Allergies.  MEDICATIONS:  Current Outpatient Medications  Medication Sig Dispense Refill   acetaminophen (TYLENOL) 500 MG tablet Take 1,000 mg by mouth every 6 (six) hours as needed for headache (pain).     aspirin EC 81 MG tablet Take 1 tablet (81 mg total) by mouth daily. 90 tablet 3   buPROPion (WELLBUTRIN XL) 150 MG 24 hr tablet Take 150 mg by mouth every morning.     carboxymethylcellulose (REFRESH TEARS) 0.5 % SOLN Place 1 drop into both eyes 3 (three) times daily as needed (dry eyes).     cholecalciferol (VITAMIN D3) 25 MCG (1000 UNIT) tablet Take 1,000 Units by mouth See admin instructions. Take 1000 mg 5 days a week, Sun through [employer]     Cyanocobalamin (B-12) 3000 MCG CAPS Take 3,000 mcg by mouth daily.     donepezil (ARICEPT) 10 MG tablet Take 1 tablet (10 mg total) by mouth at bedtime. 90 tablet 4   escitalopram (LEXAPRO) 20 MG tablet Take 20 mg by mouth every morning.     melatonin 5 MG TABS Take 20 mg by mouth at bedtime.     memantine (NAMENDA) 10 MG tablet Take 1 tablet (10 mg total) by mouth 2 (two) times daily. 180 tablet 4   midodrine (PROAMATINE) 2.5 MG tablet Take 1 tablet (2.5 mg total) by mouth 2 (two) times daily with a meal. (Patient taking differently: Take 2.5 mg by mouth 2 (two) times daily with a meal. Wife stopped AM meds due to dizziness) 180 tablet 3   Multiple Vitamin (MULTIVITAMIN) tablet Take 1 tablet  by mouth daily.     rosuvastatin (CRESTOR) 20 MG tablet Take 20 mg by mouth daily.     No current facility-administered medications for this visit.    REVIEW OF SYSTEMS:   Constitutional: ( - ) fevers, ( - )  chills , ( - ) night sweats Eyes: ( - ) blurriness of vision, ( - ) double vision, ( - ) watery eyes Ears, nose, mouth, throat, and face: ( - ) mucositis, ( - ) sore throat Respiratory: ( - ) cough, ( +) dyspnea, ( - ) wheezes Cardiovascular: ( - ) palpitation, ( - ) chest discomfort, ( - ) lower extremity swelling Gastrointestinal:  ( - ) nausea, ( - ) heartburn, ( - ) change in bowel habits Skin: ( - )  abnormal skin rashes Lymphatics: ( - ) new lymphadenopathy, ( - ) easy bruising Neurological: ( - ) numbness, ( - ) tingling, ( - ) new weaknesses Behavioral/Psych: ( - ) mood change, ( - ) new changes  All other systems were reviewed with the patient and are negative.  PHYSICAL EXAMINATION: ECOG PERFORMANCE STATUS: 0 - Asymptomatic  Vitals:   03/11/23 1030  BP: 117/63  Pulse: (!) 56  Resp: 18  Temp: (!) 97.5 F (36.4 C)  SpO2: 100%    Filed Weights   03/11/23 1030  Weight: 177 lb (80.3 kg)     GENERAL: well appearing male in NAD  SKIN: skin color, texture, turgor are normal, no rashes or significant lesions EYES: conjunctiva are pink and non-injected, sclera clear OROPHARYNX: no exudate, no erythema; lips, buccal mucosa, and tongue normal  LYMPH:  no palpable lymphadenopathy in the cervical or supraclavicular lymph nodes.  LUNGS: clear to auscultation and percussion with normal breathing effort HEART: regular rate & rhythm and no murmurs and no lower extremity edema Musculoskeletal: no cyanosis of digits and no clubbing  PSYCH: alert & oriented x 3, fluent speech NEURO: no focal motor/sensory deficits  LABORATORY DATA:  I have reviewed the data as listed    Latest Ref Rng & Units 03/05/2023    9:10 AM 02/25/2023   10:55 AM 01/30/2023    3:49 PM  CBC  WBC 4.0  - 10.5 K/uL 3.9  5.1  6.7   Hemoglobin 13.0 - 17.0 g/dL 29.5  28.4  13.2   Hematocrit 39.0 - 52.0 % 33.8  32.1  33.0   Platelets 150 - 400 K/uL 179  216  228        Latest Ref Rng & Units 03/05/2023    9:10 AM 02/25/2023   10:55 AM 01/30/2023    3:49 PM  CMP  Glucose 70 - 99 mg/dL 99  99  440   BUN 8 - 23 mg/dL 23  26  35   Creatinine 0.61 - 1.24 mg/dL 1.02  7.25  3.66   Sodium 135 - 145 mmol/L 141  141  140   Potassium 3.5 - 5.1 mmol/L 4.0  4.6  3.9   Chloride 98 - 111 mmol/L 108  107  106   CO2 22 - 32 mmol/L 28  23  20    Calcium 8.9 - 10.3 mg/dL 9.1  9.2  9.5   Total Protein 6.5 - 8.1 g/dL 7.0  6.9    Total Bilirubin 0.3 - 1.2 mg/dL 0.9  0.7    Alkaline Phos 38 - 126 U/L 36  43    AST 15 - 41 U/L 27  29    ALT 0 - 44 U/L 24  32     ASSESSMENT & PLAN QUALYN PONZIO  is a 82 y.o. male returns for a follow up for monoclonal gammopathy.    #IgG Kappa MGUS: --Bone marrow biopsy was recommended due to elevated serum free light chain ration of 2.46. Patient underwent biopsy on 01/18/2021. Pathology revealed normocellular marrow with mild polytypic plasmocytosis. Findings consistent with MGUS.  PLAN: --Labs from 03/05/2023 showed stable anemia with Hgb 11.2, MCV 106.6. Creatinine, LFTs and calcium levels are normal. SPEP showed overall stable M protein measuring 0.8 g/dL and kappa light chain 4.40, ratio 2.42.  --Most recent UPEP from 04/28/2022 showed bence jones proteins, kappa type. Next UPEP due now.  --Most recent bone met survey from 04/28/2022 showed no lytic lesions. Next one scheduled  on 03/18/2023.  --Pending upcoming UPEP and bone met survey, we will continue with surveillance and RTC in 6 months   #Macrocytic anemia: --Hgb is 11.2, MCV 106.6. Overall stable in the last several months.  --Vitamin B12, folate, and methylmalonic levels were checked on 04/02/2021 without any deficiencies.  --Continue to monitor.   No orders of the defined types were placed in this  encounter.   All questions were answered. The patient knows to call the clinic with any problems, questions or concerns.  I have spent a total of 25 minutes minutes of face-to-face and non-face-to-face time, preparing to see the patient, performing a medically appropriate examination, counseling and educating the patient, ordering tests, documenting clinical information in the electronic health record, communicating results to the patient, and care coordination.    Georga Kaufmann PA-C Dept of Hematology and Oncology [facility] Phone: [phone]

## 2023-03-16 ENCOUNTER — Telehealth: Payer: Self-pay | Admitting: Physician Assistant

## 2023-03-16 NOTE — Telephone Encounter (Signed)
I called patient's wife, Patrick Macias, to confirm that I received and reviewed the 24 hour UPEP from patient's PCP. It was done on 02/04/2023 and findings show M spike detected measuring 27 mg/24 hr. I reviewed that these findings are still consistent with MGUS. Next repeat 24 UPEP will be in one year.   Patient is scheduled for bone met survey on 03/18/2023. Pending those results, plan will be to continue on surveillance and return in 6 months for repeat labs.    Patrick Macias expressed understanding of the plan.

## 2023-03-16 NOTE — Telephone Encounter (Signed)
I spoke to the pts wife and she is concerned that someone called hr to move up the pts MRI sooner but had to resort to having it in [place] which she declines.   She is asking if he has to have it and when... does it need to be moved up. Se is uneasy with him having to lay in the MRI machine.   They are seeing Dr Nelly Laurence in a couple of days and can further discuss with him at that OV.

## 2023-03-16 NOTE — Telephone Encounter (Signed)
Pt's wife would like a callback regarding upcoming MRI. She has questions as to the location of the test, possible sooner appt according to PA as well as why he needs this done being that he just had one. Please advise

## 2023-03-18 ENCOUNTER — Ambulatory Visit (HOSPITAL_COMMUNITY)
Admission: RE | Admit: 2023-03-18 | Discharge: 2023-03-18 | Disposition: A | Discharge status: Home / Self Care | Payer: Medicare HMO | Source: Ambulatory Visit | Attending: Physician Assistant | Admitting: Physician Assistant

## 2023-03-18 DIAGNOSIS — M47812 Spondylosis without myelopathy or radiculopathy, cervical region: Secondary | ICD-10-CM | POA: Diagnosis not present

## 2023-03-18 DIAGNOSIS — D472 Monoclonal gammopathy: Secondary | ICD-10-CM | POA: Insufficient documentation

## 2023-03-19 ENCOUNTER — Ambulatory Visit: Payer: Medicare HMO | Attending: Cardiovascular Disease | Admitting: Cardiovascular Disease

## 2023-03-19 ENCOUNTER — Encounter: Payer: Self-pay | Admitting: Cardiovascular Disease

## 2023-03-19 VITALS — BP 108/60 | HR 56 | Ht 71.0 in | Wt 178.6 lb

## 2023-03-19 DIAGNOSIS — I4729 Other ventricular tachycardia: Secondary | ICD-10-CM | POA: Diagnosis not present

## 2023-03-19 NOTE — Progress Notes (Signed)
Electrophysiology Office Note:    Date:  03/19/2023   ID:  Patrick Macias, DOB [birth year], MRN [id number]  PCP:  Patrick Ran, MD    HeartCare Providers Cardiologist:  Lesleigh Noe, MD (Inactive) Electrophysiologist:  Maurice Small, MD     Referring MD: Patrick Ran, MD   History of Present Illness:    Patrick Macias is a 82 y.o. male with a hx listed below, significant for vascular disease including elevated coronary calcium score, MGUS, macrocytic anemia referred for arrhythmia management.  He has a history of frequent PVCs.  During a clinic visit in October 2023, his heart rate registered 30-40 bpm due to frequent PVCs resulting in electro-apical dissociation.  At her last visit, we switched from metoprolol to propranolol.  He is not sure whether or not he started taking magnesium taurate.  He reports that he feels great.  He has no fatigue or palpitations.   He does have occasional weakness and lightheadedness.  No chest pain, palpitations, syncope, near syncope.      EKGs/Labs/Other Studies Reviewed Today:     TTE: July 15, 2022 EF 50-55%  TTE: 02/24/2023 EF 50-55%. Moderately dilated LA   Monitor: 5 day zio patch 04/2022 Sinus rhythm 25.6% PVC burden, occasionally occurring in bigeminy. There were a few epsiodes of NSVT  Monitor 12/13/2022   Patient had a minimum heart rate of 46 bpm, maximum heart rate of 106 bpm, and average heart rate of 51 bpm.   Predominant underlying rhythm was sinus rhythm.   One six beat run of slow NSVT, one 4 beat run of NSVT   Isolated PACs were occasional (3.0%).   Isolated PVCs were frequent (11%).   No evidence of significant heart block .   No symptoms.  Lexiscan stress test:    Lexiscan stress showed no EKG changes to suggest ischemia   Myoview scan showed normal perfusion   LVEF on gating calculated at 54% with normal wall motion  Visually, LVEF appears greater   Prior study available for comparison from  05/17/2019. no change  EKG:  Last EKG results: today - sinus bradycardia with PVCs Recent Labs: 01/30/2023: TSH 0.809 02/25/2023: NT-Pro BNP 389 03/05/2023: ALT 24; BUN 23; Creatinine 1.12; Hemoglobin 11.2; Platelet Count 179; Potassium 4.0; Sodium 141     Physical Exam:    VS:  BP 108/60   Pulse (!) 56   Ht 5\' 11"  (1.803 m)   Wt 178 lb 9.6 oz (81 kg)   SpO2 98%   BMI 24.91 kg/m     Wt Readings from Last 3 Encounters:  03/19/23 178 lb 9.6 oz (81 kg)  03/11/23 177 lb (80.3 kg)  02/25/23 177 lb 6.4 oz (80.5 kg)     GEN:  Well nourished, well developed in no acute distress CARDIAC: RRR, no murmurs, rubs, gallops RESPIRATORY:  Normal work of breathing MUSCULOSKELETAL: no edema    ASSESSMENT & PLAN:    Frequent PVCs:  EF is normal.  Burden has decreased from 25% May 22, 2022 to 11% in June 2024 Symptoms of fatigue and shortness of breath have gotten worse despite improvement in PVC burden --PVCs did not appear to be the cause  Sinus bradycardia Possible chronotropic incompetence We will order routine treadmill test  NSVT: no intervention indicated        Medication Adjustments/Labs and Tests Ordered: Current medicines are reviewed at length with the patient today.  Concerns regarding medicines are outlined above.  Orders Placed This Encounter  Procedures   EKG 12-Lead   No orders of the defined types were placed in this encounter.    Signed, Maurice Small, MD  03/19/2023 3:02 PM    [facility]

## 2023-03-19 NOTE — Patient Instructions (Signed)
Medication Instructions:  Your physician recommends that you continue on your current medications as directed. Please refer to the Current Medication list given to you today. *If you need a refill on your cardiac medications before your next appointment, please call your pharmacy*   Testing/Procedures: Exercise Treadmill Test   Follow-Up: At [facility], you and your health needs are our priority.  As part of our continuing mission to provide you with exceptional heart care, we have created designated Provider Care Teams.  These Care Teams include your primary Cardiologist (physician) and Advanced Practice Providers (APPs -  Physician Assistants and Nurse Practitioners) who all work together to provide you with the care you need, when you need it.  We recommend signing up for the patient portal called "MyChart".  Sign up information is provided on this After Visit Summary.  MyChart is used to connect with patients for Virtual Visits (Telemedicine).  Patients are able to view lab/test results, encounter notes, upcoming appointments, etc.  Non-urgent messages can be sent to your provider as well.   To learn more about what you can do with MyChart, go to [email or web address].    Your next appointment:   6 week(s)  Provider:   York Pellant, MD

## 2023-03-25 DIAGNOSIS — M48062 Spinal stenosis, lumbar region with neurogenic claudication: Secondary | ICD-10-CM | POA: Diagnosis not present

## 2023-03-25 DIAGNOSIS — M47816 Spondylosis without myelopathy or radiculopathy, lumbar region: Secondary | ICD-10-CM | POA: Diagnosis not present

## 2023-03-26 ENCOUNTER — Ambulatory Visit: Payer: Medicare HMO

## 2023-03-26 DIAGNOSIS — G3183 Dementia with Lewy bodies: Secondary | ICD-10-CM | POA: Diagnosis not present

## 2023-03-26 DIAGNOSIS — F02A18 Dementia in other diseases classified elsewhere, mild, with other behavioral disturbance: Secondary | ICD-10-CM | POA: Diagnosis not present

## 2023-03-30 NOTE — Progress Notes (Unsigned)
Cardiology Office Note    Date:  03/30/2023  ID:  Patrick Macias, Patrick Macias [birth year], MRN [id number] PCP:  Rodrigo Ran, MD  Cardiologist:  Lesleigh Noe, MD (Inactive)  Electrophysiologist:  Maurice Small, MD   Chief Complaint: ***  History of Present Illness: .    Patrick Macias is a 82 y.o. male with visit-pertinent history of coronary calcification with last negative nuc 06/2022, right carotid bruit with only mild carotid artery disease, memory loss, anxiety, BPH, HTN, colon polyps, nephrolithasis, vit D deficiency, macrocytic anemia, MGUS, TIA, frequent PVCs, occasional PACs, NSVT, mild-moderate AI, mild MR who is seen for follow-up.   He had a prior calcium score 01/2019 with CAC 846 (75%ile)  followed by normal stress test 05/2019. Echocardiogram 05/2020 showed normal LVEF during admission for possible TIA for which he was treated with ASA + Plavix for 3 weeks followed by ASA monotherapy. There was concern that this was either TIA versus underlying cognitive impairment. CT angio head/neck showed no large vessel occlusion (20-30% prox R ICA and left carotid bulb). He was evaluated 02/2022 for HR registering 30s-40s at times on pulse ox. His EKG showed frequent PVCs likely not picking up. 3 day monitor completed 04/2022 showed avg HR 65bpm, minimum 46bpm, 28% PVC burden, 12 beats NSVT, 17 episodes of NSVT longest of 6 beats, prompting Toprol 25mg  daily and EP eval. Echo 03/2022 showed EF 50-55%, mildly enlarged RV, mild-moderate AI, mild MR. Nuclear stress test done 06/2022 to exclude ischemia as cause of ectopy was negative for ischemia. He saw Dr. Nelly Laurence 06/2022 who switched metoprolol to propranolol and added magnesium. PVCs seemed to have resolved by 08/2022 EP f/u. He was re-evaluated here 01/30/23 for marked orthostasis. Primary care had stopped propranolol due to concern for bradycardia and hypotension, and added fludrocortisone every other day with significant improvement in symptoms. He  was still orthostatic at 01/30/23 OV with [place] so fludrocortisone was increased from every other day to daily. However, labs came back showing new AKI of unclear cause with Cr of 2.02, so recommended to go to ER, and he declined, preferring PCP follow-up instead. Repeat monitor off propranolol requested by Dr. Nelly Laurence showed average HR 51bpm, ranging 46-106bpm, 11% PVCs, 3% PACs, one 6beat run NSVT, one 4 beat run NSVT. Repeat echo 02/24/23 showed EF 50-55%, normal RV function, mild RVE, trivial MR, moderate LAE, mild-moderate AI. When I saw him back in follow-up 02/25/23, his wife noted he had begun having new DOE. I started midodrine as he was still having intermittent dizziness on standing. I discussed case with Dr. Gala Romney who recommended cMRI to evaluate for amyloidosis. He saw Dr. Nelly Laurence back in follow-up given frequent PVCs and requirement of stopping beta blocker; he ordered ETT to assess chronotropic competence.   Skeletal survey  Orthostatic hypotension, episodic dizziness Dyspnea on exertion Frequent PVCs, h/o NSVT Mild-moderate AI Coronary calcification Bilateral carotid stenosis     Labwork independently reviewed: 03/2023 Hgb 11.2, plt 179, K 4.0, Cr 1.12, LFTs OK except AP low, LDH 170 Outside labs 02/02/23 showed Cr 1.6, then 1.3 on 02/23/23. LFTs/B12 were normal and Hgb 11.1 02/23/23. Iron studies were reassuring. UA 8/7 showed 432 mg/24hr of protein (ref range <150), M spike 6.2%, "Immunofixation shows IgG monoclonal protein with kappa light chain specificity. Bence Jones Protein positive, kappa type.  01/30/23 TSH wnl, Hgb 11.5, plt 228, K 3.9, Cr 2.02 04/2022 Hgb 13.4, plt 209, K 4.2, Cr 0.97, LFTs ok, LDH 170 02/2022 TSH  wnl, Mg 2.4     ROS: .    Please see the history of present illness. Otherwise, review of systems is positive for ***.  All other systems are reviewed and otherwise negative.  Studies Reviewed: Marland Kitchen    EKG:  EKG is ordered today, personally reviewed, demonstrating  ***  CV Studies: Cardiac studies reviewed are outlined and summarized above. Otherwise please see EMR for full report.   Current Reported Medications:.    No outpatient medications have been marked as taking for the 03/31/23 encounter (Appointment) with Laurann Montana, PA-C.    Physical Exam:    VS:  There were no vitals taken for this visit.   Wt Readings from Last 3 Encounters:  03/19/23 178 lb 9.6 oz (81 kg)  03/11/23 177 lb (80.3 kg)  02/25/23 177 lb 6.4 oz (80.5 kg)    GEN: Well nourished, well developed in no acute distress NECK: No JVD; No carotid bruits CARDIAC: ***RRR, no murmurs, rubs, gallops RESPIRATORY:  Clear to auscultation without rales, wheezing or rhonchi  ABDOMEN: Soft, non-tender, non-distended EXTREMITIES:  No edema; No acute deformity   Asessement and Plan:.     ***     Disposition: F/u with ***  Signed, Laurann Montana, PA-C

## 2023-03-31 ENCOUNTER — Ambulatory Visit: Payer: Medicare HMO | Attending: Physician Assistant | Admitting: Physician Assistant

## 2023-03-31 ENCOUNTER — Encounter: Payer: Self-pay | Admitting: Physician Assistant

## 2023-03-31 VITALS — BP 131/66 | HR 60 | Ht 71.0 in | Wt 178.0 lb

## 2023-03-31 DIAGNOSIS — I951 Orthostatic hypotension: Secondary | ICD-10-CM | POA: Diagnosis not present

## 2023-03-31 DIAGNOSIS — R0609 Other forms of dyspnea: Secondary | ICD-10-CM

## 2023-03-31 DIAGNOSIS — I351 Nonrheumatic aortic (valve) insufficiency: Secondary | ICD-10-CM | POA: Diagnosis not present

## 2023-03-31 DIAGNOSIS — I6523 Occlusion and stenosis of bilateral carotid arteries: Secondary | ICD-10-CM | POA: Diagnosis not present

## 2023-03-31 DIAGNOSIS — I2510 Atherosclerotic heart disease of native coronary artery without angina pectoris: Secondary | ICD-10-CM | POA: Diagnosis not present

## 2023-03-31 DIAGNOSIS — I493 Ventricular premature depolarization: Secondary | ICD-10-CM

## 2023-03-31 MED ORDER — FLUDROCORTISONE 0.1 MG/ML ORAL SUSPENSION: 0.1000 mg | ORAL | 3 refills | Status: DC

## 2023-03-31 MED ORDER — FLUDROCORTISONE ACETATE 0.1 MG PO TABS: 0.1000 mg | ORAL_TABLET | ORAL | 3 refills | Status: DC

## 2023-03-31 NOTE — Addendum Note (Signed)
Addended by: Virl Axe, Oretta Berkland L on: 03/31/2023 12:47 PM   Modules accepted: Orders

## 2023-03-31 NOTE — Patient Instructions (Addendum)
Medication Instructions:  Your physician has recommended you make the following change in your medication:  1-STOP Midodrine  2-START back Fludrocortisone 0.1 mg every other day  *If you need a refill on your cardiac medications before your next appointment, please call your pharmacy*  Lab Work: Your physician recommends that you have lab work today- BMET and CBC  Your physician recommends that you return for lab work in: 1 week BMET   If you have labs (blood work) drawn today and your tests are completely normal, you will receive your results only by: [employer] (if you have MyChart) OR A paper copy in the mail If you have any lab test that is abnormal or we need to change your treatment, we will call you to review the results.  Testing/Procedures: Your physician has requested that you have a carotid duplex. This test is an ultrasound of the carotid arteries in your neck. It looks at blood flow through these arteries that supply the brain with blood. Allow one hour for this exam. There are no restrictions or special instructions.  Follow-Up: At [facility], you and your health needs are our priority.  As part of our continuing mission to provide you with exceptional heart care, we have created designated Provider Care Teams.  These Care Teams include your primary Cardiologist (physician) and Advanced Practice Providers (APPs -  Physician Assistants and Nurse Practitioners) who all work together to provide you with the care you need, when you need it.  We recommend signing up for the patient portal called "MyChart".  Sign up information is provided on this After Visit Summary.  MyChart is used to connect with patients for Virtual Visits (Telemedicine).  Patients are able to view lab/test results, encounter notes, upcoming appointments, etc.  Non-urgent messages can be sent to your provider as well.   To learn more about what you can do with MyChart, go to  [email or web address].    Your next appointment:   Please keep follow-up appointments already scheduled  Provider:   Dr. Nelly Laurence and Dr. Izora Ribas

## 2023-04-01 ENCOUNTER — Ambulatory Visit
Admission: RE | Admit: 2023-04-01 | Discharge: 2023-04-01 | Disposition: A | Discharge status: Home / Self Care | Payer: Medicare HMO | Source: Ambulatory Visit | Attending: Physician Assistant | Admitting: Physician Assistant

## 2023-04-01 ENCOUNTER — Other Ambulatory Visit: Payer: Self-pay | Admitting: Physician Assistant

## 2023-04-01 DIAGNOSIS — I351 Nonrheumatic aortic (valve) insufficiency: Secondary | ICD-10-CM | POA: Diagnosis not present

## 2023-04-01 DIAGNOSIS — E854 Organ-limited amyloidosis: Secondary | ICD-10-CM | POA: Diagnosis not present

## 2023-04-01 DIAGNOSIS — I4729 Other ventricular tachycardia: Secondary | ICD-10-CM | POA: Insufficient documentation

## 2023-04-01 DIAGNOSIS — I340 Nonrheumatic mitral (valve) insufficiency: Secondary | ICD-10-CM | POA: Diagnosis not present

## 2023-04-01 DIAGNOSIS — R0609 Other forms of dyspnea: Secondary | ICD-10-CM | POA: Diagnosis not present

## 2023-04-01 DIAGNOSIS — I43 Cardiomyopathy in diseases classified elsewhere: Secondary | ICD-10-CM | POA: Insufficient documentation

## 2023-04-01 LAB — BASIC METABOLIC PANEL
BUN/Creatinine Ratio: 20 (ref 10–24)
BUN: 21 mg/dL (ref 8–27)
CO2: 22 mmol/L (ref 20–29)
Calcium: 8.7 mg/dL (ref 8.6–10.2)
Chloride: 106 mmol/L (ref 96–106)
Creatinine, Ser: 1.04 mg/dL (ref 0.76–1.27)
Glucose: 90 mg/dL (ref 70–99)
Potassium: 4.4 mmol/L (ref 3.5–5.2)
Sodium: 142 mmol/L (ref 134–144)
eGFR: 73 mL/min/{1.73_m2} (ref >59)

## 2023-04-01 LAB — CBC
Hematocrit: 33.9 % — ABNORMAL LOW (ref 37.5–51.0)
Hemoglobin: 11.2 g/dL — ABNORMAL LOW (ref 13.0–17.7)
MCH: 35.2 pg — ABNORMAL HIGH (ref 26.6–33.0)
MCHC: 33 g/dL (ref 31.5–35.7)
MCV: 107 fL — ABNORMAL HIGH (ref 79–97)
Platelets: 214 10*3/uL (ref 150–450)
RBC: 3.18 x10E6/uL — ABNORMAL LOW (ref 4.14–5.80)
RDW: 12.3 % (ref 11.6–15.4)
WBC: 6.1 10*3/uL (ref 3.4–10.8)

## 2023-04-01 MED ORDER — GADOBUTROL 1 MMOL/ML IV SOLN
12.0000 mL | Freq: Once | INTRAVENOUS | Status: AC | PRN
  Administered 2023-04-01: 12 mL via INTRAVENOUS

## 2023-04-07 ENCOUNTER — Ambulatory Visit: Payer: Medicare HMO | Attending: Physician Assistant

## 2023-04-07 ENCOUNTER — Ambulatory Visit: Payer: Medicare HMO

## 2023-04-07 DIAGNOSIS — R0609 Other forms of dyspnea: Secondary | ICD-10-CM

## 2023-04-07 DIAGNOSIS — I951 Orthostatic hypotension: Secondary | ICD-10-CM

## 2023-04-07 DIAGNOSIS — I2510 Atherosclerotic heart disease of native coronary artery without angina pectoris: Secondary | ICD-10-CM

## 2023-04-07 DIAGNOSIS — I351 Nonrheumatic aortic (valve) insufficiency: Secondary | ICD-10-CM

## 2023-04-07 DIAGNOSIS — I6523 Occlusion and stenosis of bilateral carotid arteries: Secondary | ICD-10-CM

## 2023-04-07 DIAGNOSIS — I493 Ventricular premature depolarization: Secondary | ICD-10-CM

## 2023-04-08 ENCOUNTER — Telehealth: Payer: Self-pay | Admitting: Physician Assistant

## 2023-04-08 LAB — BASIC METABOLIC PANEL
BUN/Creatinine Ratio: 22 (ref 10–24)
BUN: 23 mg/dL (ref 8–27)
CO2: 24 mmol/L (ref 20–29)
Calcium: 9.9 mg/dL (ref 8.6–10.2)
Chloride: 105 mmol/L (ref 96–106)
Creatinine, Ser: 1.06 mg/dL (ref 0.76–1.27)
Glucose: 81 mg/dL (ref 70–99)
Potassium: 4.2 mmol/L (ref 3.5–5.2)
Sodium: 141 mmol/L (ref 134–144)
eGFR: 71 mL/min/{1.73_m2} (ref >59)

## 2023-04-08 NOTE — Telephone Encounter (Signed)
The patients wife Harriett Sine (on [place]) has been notified of the result and verbalized understanding.  All questions (if any) were answered.   Harriett Sine said the pt is doing so much better on the fludrocortisone regimen [name] prescribed him.  Harriett Sine said his dizziness has very much improved.  Harriett Sine wanted to make sure Kriste Basque still wants him to continue this regimen.  Harriett Sine is aware I will have Ronie Spies PA-C further advise if she wants the pt to continue his fludrocortisone regimen long-term or not.  She is aware a triage nurse will follow-up with her accordingly thereafter.  Harriett Sine verbalized understanding and agrees with this plan .

## 2023-04-08 NOTE — Telephone Encounter (Signed)
Yes, continue present regimen for now. So glad to hear!   Thinking ahead - I want them to let us know if the dizziness worsens at which time I would think about increasing the fludrocortisone to daily as we had considered this summer, but for now since the every-other-day dosing is working well, let's stick with that. Stay hydrated. Keep f/u as planned.

## 2023-04-08 NOTE — Telephone Encounter (Signed)
Patient aware of [name] Dunn PA's advisement.

## 2023-04-08 NOTE — Telephone Encounter (Signed)
Pt's wife returning nurses call regarding lab results. Call transferred

## 2023-04-08 NOTE — Telephone Encounter (Signed)
-----   Message from Laurann Montana sent at 04/08/2023  7:53 AM EDT ----- Patient has memory loss, wife assists with majority of care. Let them know labs are stable thankfully. Please let me know how episodes of dizziness are doing on fludrocortisone.

## 2023-04-09 ENCOUNTER — Telehealth: Payer: Self-pay | Admitting: Physician Assistant

## 2023-04-09 NOTE — Telephone Encounter (Signed)
I spoke to patient's wife, Patrick Macias to review the bone met survey results from 03/18/2023.  There is a possible faint lucency involving the distal left radius. No definitive evidence of lytic or destructive lesions are noted. Patient's wife report that [name] is not complaining of any pain or discomfort in the distal left radius. Dr. Leonides Schanz recommends to monitor for now and only pursue further testing if pain presents.   There are calcifications seen cervical soft tissues bilaterally suggesting carotid artery stenosis. He is scheduled for further testing with his cardiologist on 04/15/2023.   Patient will continue on surveillance and return in 6 months for scheduled for follow up.

## 2023-04-10 ENCOUNTER — Encounter (HOSPITAL_COMMUNITY): Payer: Medicare HMO

## 2023-04-10 DIAGNOSIS — G3183 Dementia with Lewy bodies: Secondary | ICD-10-CM | POA: Diagnosis not present

## 2023-04-10 DIAGNOSIS — F02A18 Dementia in other diseases classified elsewhere, mild, with other behavioral disturbance: Secondary | ICD-10-CM | POA: Diagnosis not present

## 2023-04-12 ENCOUNTER — Encounter: Payer: Self-pay | Admitting: Neurology

## 2023-04-14 DIAGNOSIS — M5416 Radiculopathy, lumbar region: Secondary | ICD-10-CM | POA: Diagnosis not present

## 2023-04-15 ENCOUNTER — Ambulatory Visit (HOSPITAL_COMMUNITY)
Admission: RE | Admit: 2023-04-15 | Discharge: 2023-04-15 | Disposition: A | Discharge status: Home / Self Care | Payer: Medicare HMO | Source: Ambulatory Visit | Attending: Cardiology | Admitting: Cardiology

## 2023-04-15 DIAGNOSIS — I351 Nonrheumatic aortic (valve) insufficiency: Secondary | ICD-10-CM | POA: Diagnosis not present

## 2023-04-15 DIAGNOSIS — I951 Orthostatic hypotension: Secondary | ICD-10-CM | POA: Diagnosis not present

## 2023-04-15 DIAGNOSIS — I6523 Occlusion and stenosis of bilateral carotid arteries: Secondary | ICD-10-CM

## 2023-04-15 DIAGNOSIS — I493 Ventricular premature depolarization: Secondary | ICD-10-CM | POA: Diagnosis not present

## 2023-04-15 DIAGNOSIS — R0609 Other forms of dyspnea: Secondary | ICD-10-CM

## 2023-04-15 DIAGNOSIS — I2510 Atherosclerotic heart disease of native coronary artery without angina pectoris: Secondary | ICD-10-CM

## 2023-04-22 ENCOUNTER — Encounter: Payer: Self-pay | Admitting: Cardiovascular Disease

## 2023-04-23 ENCOUNTER — Ambulatory Visit: Payer: Medicare HMO | Admitting: Cardiovascular Disease

## 2023-04-23 DIAGNOSIS — Z111 Encounter for screening for respiratory tuberculosis: Secondary | ICD-10-CM | POA: Diagnosis not present

## 2023-05-04 ENCOUNTER — Other Ambulatory Visit: Payer: Medicare HMO

## 2023-05-04 NOTE — Progress Notes (Unsigned)
Cardiology Office Note:  .    Date:  05/06/2023  ID:  Patrick Macias, DOB [birth year], MRN [id number] PCP: Rodrigo Ran, MD  [facility] Providers Cardiologist:  Lesleigh Noe, MD (Inactive) Electrophysiologist:  Maurice Small, MD     CC: Transition to new cardiologist  History of Present Illness: .    Patrick Macias is a 82 y.o. male with a history of CAD, Mild CAS, Significant memory loss, symptomatic orthostatic hypotension.  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 Nelly Laurence)   Discussed the use of AI scribe software for clinical note transcription with the patient, who gave verbal consent to proceed.  Patrick Macias, an 82 year old individual with a history of coronary artery disease, minimal carotid artery stenosis, significant memory loss/dementia, and symptomatic orthostatic hypotension, presents with persistent shortness of breath. He has been managed on fludrocortisone for orthostatic hypotension, which has been more effective than previous treatment with midodrine. Despite this, he continues to experience episodes of feeling unwell and shortness of breath, particularly when walking short distances.  He also has a history of frequent PVCs, managed conservatively by an electrophysiologist. His cardiac MRI showed a small area of scar on the heart, but this was not deemed significant enough to be the foci of the frequent ectopic beats. He has not been placed on medications to suppress these ectopic beats due to concerns about exacerbating the orthostatic hypotension.  In addition to these cardiac issues, he has a monoclonal gammopathy of unclear significance, for which he is under the care of a hematologist. A recent evaluation for AL cardiac amyloidosis in this context was negative (CMR)  He also reports ongoing back pain, for which he recently received an epidural and is scheduled for a similar procedure in the near future. Despite these multiple  health issues, his heart function is reported as normal, with only mild aortic regurgitation noted.  He and his family express some frustration with the lack of clarity regarding the cause of the persistent shortness of breath. T  Relevant histories: .  Social: HS patient, now me an [name] ROS: As per HPI.   Studies Reviewed: .   Cardiac Studies & Procedures     STRESS TESTS  MYOCARDIAL PERFUSION IMAGING 07/17/2022  Narrative   Lexiscan stress showed no EKG changes to suggest ischemia   Myoview scan showed normal perfusion   LVEF on gating calculated at 54% with normal wall motion  Visually, LVEF appears greater   Prior study available for comparison from 05/17/2019. no change   Overall low risk study   ECHOCARDIOGRAM  ECHOCARDIOGRAM COMPLETE 02/24/2023  Narrative ECHOCARDIOGRAM REPORT    Patient Name:   Patrick Macias Date of Exam: 02/24/2023 Medical Rec #:  [id number]       Height:       71.0 in Accession #:    [id number]      Weight:       176.0 lb Date of Birth:  August 18, 1940       BSA:          1.997 m Patient Age:    81 years        BP:           126/60 mmHg Patient Gender: M               HR:           60 bpm. Exam Location:  [address]  Procedure: 2D Echo, Cardiac  Doppler and Color Doppler  Indications:    R00.8 Other Abnormalities of Heart (PVC's, NSVT)  History:        Patient has prior history of Echocardiogram examinations, most recent 04/01/2022. TIA, Arrythmias:PVC and NSVT, Signs/Symptoms:Hypotension and Murmur; Risk Factors:Hypertension and Dyslipidemia. Coronary Calcifications, Aortic Insufficiency.  Sonographer:    Farrel Conners RDCS Referring Phys: Laurann Montana  IMPRESSIONS   1. Left ventricular ejection fraction, by estimation, is 50 to 55%. The left ventricle has low normal function. The left ventricle has no regional wall motion abnormalities. Left ventricular diastolic parameters are indeterminate. 2. Right ventricular systolic function is  normal. The right ventricular size is mildly enlarged. There is normal pulmonary artery systolic pressure. 3. Left atrial size was moderately dilated. 4. The mitral valve is normal in structure. Trivial mitral valve regurgitation. No evidence of mitral stenosis. 5. The aortic valve is tricuspid. There is mild calcification of the aortic valve. Aortic valve regurgitation is mild to moderate. No aortic stenosis is present. Aortic regurgitation PHT measures 547 msec. Aortic valve mean gradient measures 4.0 mmHg. 6. The inferior vena cava is normal in size with <50% respiratory variability, suggesting right atrial pressure of 8 mmHg.  Comparison(s): No significant change from prior study.  FINDINGS Left Ventricle: Left ventricular ejection fraction, by estimation, is 50 to 55%. The left ventricle has low normal function. The left ventricle has no regional wall motion abnormalities. The left ventricular internal cavity size was normal in size. There is no left ventricular hypertrophy. Left ventricular diastolic parameters are indeterminate.  Right Ventricle: The right ventricular size is mildly enlarged. Right vetricular wall thickness was not well visualized. Right ventricular systolic function is normal. There is normal pulmonary artery systolic pressure. The tricuspid regurgitant velocity is 2.57 m/s, and with an assumed right atrial pressure of 8 mmHg, the estimated right ventricular systolic pressure is 34.4 mmHg.  Left Atrium: Left atrial size was moderately dilated.  Right Atrium: Right atrial size was normal in size.  Pericardium: There is no evidence of pericardial effusion.  Mitral Valve: The mitral valve is normal in structure. Trivial mitral valve regurgitation. No evidence of mitral valve stenosis.  Tricuspid Valve: The tricuspid valve is normal in structure. Tricuspid valve regurgitation is trivial. No evidence of tricuspid stenosis.  Aortic Valve: The aortic valve is tricuspid.  There is mild calcification of the aortic valve. Aortic valve regurgitation is mild to moderate. Aortic regurgitation PHT measures 547 msec. No aortic stenosis is present. Aortic valve mean gradient measures 4.0 mmHg. Aortic valve peak gradient measures 7.0 mmHg. Aortic valve area, by VTI measures 3.10 cm.  Pulmonic Valve: The pulmonic valve was grossly normal. Pulmonic valve regurgitation is mild. No evidence of pulmonic stenosis.  Aorta: The aortic root, ascending aorta and aortic arch are all structurally normal, with no evidence of dilitation or obstruction.  Venous: The inferior vena cava is normal in size with less than 50% respiratory variability, suggesting right atrial pressure of 8 mmHg.  IAS/Shunts: The atrial septum is grossly normal.   LEFT VENTRICLE PLAX 2D LVIDd:         5.30 cm   Diastology LVIDs:         2.90 cm   LV e' medial:    5.22 cm/s LV PW:         0.80 cm   LV E/e' medial:  13.6 LV IVS:        1.00 cm   LV e' lateral:   6.71 cm/s LVOT diam:  2.40 cm   LV E/e' lateral: 10.5 LV SV:         90 LV SV Index:   45 LVOT Area:     4.52 cm   RIGHT VENTRICLE RV Basal diam:  4.20 cm RV Mid diam:    2.90 cm RV S prime:     13.40 cm/s TAPSE (M-mode): 2.2 cm RVSP:           29.4 mmHg  LEFT ATRIUM           Index        RIGHT ATRIUM           Index LA diam:      4.50 cm 2.25 cm/m   RA Pressure: 3.00 mmHg LA Vol (A2C): 41.3 ml 20.68 ml/m  RA Area:     14.80 cm LA Vol (A4C): 75.7 ml 37.91 ml/m  RA Volume:   36.80 ml  18.43 ml/m AORTIC VALVE AV Area (Vmax):    2.88 cm AV Area (Vmean):   2.73 cm AV Area (VTI):     3.10 cm AV Vmax:           132.50 cm/s AV Vmean:          90.550 cm/s AV VTI:            0.289 m AV Peak Grad:      7.0 mmHg AV Mean Grad:      4.0 mmHg LVOT Vmax:         84.30 cm/s LVOT Vmean:        54.600 cm/s LVOT VTI:          0.198 m LVOT/AV VTI ratio: 0.69 AI PHT:            547 msec  AORTA Ao Root diam: 3.60 cm Ao Asc diam:   3.60 cm  MITRAL VALVE               TRICUSPID VALVE MV Area (PHT): cm         TR Peak grad:   26.4 mmHg MV Decel Time: 262 msec    TR Vmax:        257.00 cm/s MV E velocity: 70.75 cm/s  Estimated RAP:  3.00 mmHg MV A velocity: 95.98 cm/s  RVSP:           29.4 mmHg MV E/A ratio:  0.74 SHUNTS Systemic VTI:  0.20 m Systemic Diam: 2.40 cm  Jodelle Red MD Electronically signed by Jodelle Red MD Signature Date/Time: 02/24/2023/4:46:11 PM    Final    MONITORS  CARDIAC EVENT MONITOR 02/11/2023  Narrative   Patient had a minimum heart rate of 46 bpm, maximum heart rate of 106 bpm, and average heart rate of 51 bpm.   Predominant underlying rhythm was sinus rhythm.   One six beat run of slow NSVT, one 4 beat run of NSVT   Isolated PACs were occasional (3.0%).   Isolated PVCs were frequent (11%).   No evidence of significant heart block .   No symptoms.  Frequent PVCs.   CT SCANS  CT CARDIAC SCORING (SELF PAY ONLY) 02/04/2019  Narrative CLINICAL DATA:  82 year old Caucasian male with history of hypertension.  EXAM: CT HEART FOR CALCIUM SCORING  TECHNIQUE: CT heart was performed using prospective ECG gating.  A non-contrast exam for calcium scoring was performed.  Note that this exam targets the heart and the chest was not imaged in its entirety.  COMPARISON:  None.  FINDINGS: Scientist, product/process development  quality: Good.  CORONARY CALCIUM  Total Agatston Score: 846  MESA database percentile: 75th  OTHER FINDINGS:  Aortic atherosclerosis. Within the visualized portions of the thorax there are no suspicious appearing pulmonary nodules or masses, there is no acute consolidative airspace disease, no pleural effusions, no pneumothorax and no lymphadenopathy. Visualized portions of the upper abdomen are unremarkable. There are no aggressive appearing lytic or blastic lesions noted in the visualized portions of the skeleton.  IMPRESSION: 1. Patient's total  coronary artery calcium score is 846 which is 75th percentile for patient's of matched age, gender and race/ethnicity. Please note that although the presence of coronary artery calcium documents the presence of coronary artery disease, the severity of this disease and any potential stenosis cannot be assessed on this noncontrast CT examination. Assessment for potential risk factor modification, dietary therapy or pharmacologic therapy may be warranted, if clinically indicated. 2.  Aortic Atherosclerosis (ICD10-I70.0).   Electronically Signed By: Trudie Reed M.D. On: 02/04/2019 14:07   CARDIAC MRI  MR CARDIAC MORPHOLOGY W WO CONTRAST 04/01/2023  Narrative CLINICAL DATA:  Frequent PVC's  EXAM: CARDIAC MRI  TECHNIQUE: The patient was scanned on a 1.5 Tesla Siemens magnet. A dedicated cardiac coil was used. Functional imaging was done using Fiesta sequences. 2,3, and 4 chamber views were done to assess for RWMA's. Modified Simpson's rule using a short axis stack was used to calculate an ejection fraction on a dedicated work [employer]. The patient received 12 cc of Gadavist. After 10 minutes inversion recovery sequences were used to assess for infiltration and scar tissue. Velocity flow mapping performed in the ascending aorta and main pulmonary artery.  CONTRAST:  12 cc  of Gadavist  FINDINGS: 1. Normal left ventricular size, thickness and systolic function (LVEF = 52%). There are no regional wall motion abnormalities.  There is basal septal midwall late gadolinium enhancement in the left ventricular myocardium.  LVEDV: 133 ml  LVESV: 66 ml  SV: 69 ml  CO: 4 L/min  Myocardial mass: 103g  LV native T1 value 988 ms (normal <1000 ms)  LV ECV value 21.4% (normal <30%)  2. Mildly dilated right ventricular size, normal thickness and normal systolic function (RVEF = 51%). There are no regional wall motion abnormalities.  3.  Mildly dilated  right atrial size normal left atrial size.  4. Normal size of the aortic root, ascending aorta and pulmonary artery.  5.  Mild AI, mild TR.  no significant valvular abnormalities.  6.  Normal pericardium.  No pericardial effusion.  IMPRESSION: 1.  Normal LV systolic function.  LVEF 52%.  2.  Linear basal septal mid wall LGE (likely physiologic finding).  3.  No evidence for infiltrative disease.  4.  Normal RV function.  5.  No significant valvular abnormalities.  6.  No findings to suggest etiology of PVC's   Electronically Signed By: Debbe Odea M.D. On: 04/01/2023 15:47          Physical Exam:    VS:  BP 134/70   Pulse (!) 54   Ht 5\' 11"  (1.803 m)   Wt 181 lb (82.1 kg)   SpO2 96%   BMI 25.24 kg/m    Wt Readings from Last 3 Encounters:  05/06/23 181 lb (82.1 kg)  03/31/23 178 lb (80.7 kg)  03/19/23 178 lb 9.6 oz (81 kg)    Gen: no distress   Neck: No JVD Cardiac: No Rubs or Gallops, largely systolic murmur, IRIR bradycardia +2radial pulses Respiratory:  Clear to auscultation bilaterally, normal effort, normal  respiratory rate GI: Soft, nontender, non-distended  MS: No  edema;  moves all extremities Integument: Skin feels warm Neuro:  At time of evaluation, alert and oriented to person/place/time/situation  Psych: Normal affect, patient feels well   ASSESSMENT AND PLAN: .    Orthostatic Hypotension - Symptomatic with improvement on fludrocortisone. Discussed non-pharmacological interventions including compression stockings, abdominal binders, and ways to mitigate supine hypertension -Continue fludrocortisone 0.1mg  every 48 hours. -Consider compression stockings, abdominal binders, then daily Florinef if symptoms reoccur  SOB complicated by dementia Coronary Artery Disease - Known coronary artery calcifications with no chest pain. Shortness of breath reported, but unclear if cardiac in origin. Discussed options for further evaluation including  cardiac catheterization and stress test (PET MPI); he cannot walk for exercise testing and his ectopy precludes CCTA - reviewed prior CAC CT with patient -Deferred further evaluation at this time. If symptoms persist, consider PET myocardial perfusion imaging test. - LDL at goal on current therapy  Premature Ventricular Contractions - Managed by Dr. Nelly Laurence. No current medication for suppression due to concern for exacerbating orthostatic hypotension. -Continue current management plan with EP  Monoclonal Gammopathy of Unclear Significance Negative for ATTR cardiac amyloidosis. No current cardiac issues related to this condition. -Continue current management plan with hematologist.  Mild Aortic Regurgitation Confirmed by MRI. Not causing symptoms or contributing to shortness of breath. -No change in management.  Mild Carotid Artery Stenosis -No need for interval follow-up.  Follow-up -See Lucile Crater PA-C in 3 months. -See me in 6 months.    Time Spent Directly with Patient:   I have spent a total of 42 minutes with the patient reviewing notes, imaging, EKGs, labs, CMR and CAC study and examining the patient as well as establishing an assessment and plan that was discussed personally with the patient. Discussed disease state education shared decision making for shortness of breath, and discussed safe walking and mobility with dementia and dysautonomia. Reviewed care and plan in collaboration with wife    Riley Lam, MD FASE [facility] Cardiologist [facility]  [address] [place], [address] [place], [place] [address] [phone]  3:35 PM

## 2023-05-06 ENCOUNTER — Ambulatory Visit: Payer: Medicare HMO | Attending: Internal Medicine | Admitting: Internal Medicine

## 2023-05-06 ENCOUNTER — Encounter: Payer: Self-pay | Admitting: Internal Medicine

## 2023-05-06 VITALS — BP 134/70 | HR 54 | Ht 71.0 in | Wt 181.0 lb

## 2023-05-06 DIAGNOSIS — I493 Ventricular premature depolarization: Secondary | ICD-10-CM | POA: Diagnosis not present

## 2023-05-06 DIAGNOSIS — I10 Essential (primary) hypertension: Secondary | ICD-10-CM | POA: Diagnosis not present

## 2023-05-06 DIAGNOSIS — R4189 Other symptoms and signs involving cognitive functions and awareness: Secondary | ICD-10-CM

## 2023-05-06 DIAGNOSIS — D472 Monoclonal gammopathy: Secondary | ICD-10-CM

## 2023-05-06 DIAGNOSIS — I4729 Other ventricular tachycardia: Secondary | ICD-10-CM | POA: Insufficient documentation

## 2023-05-06 NOTE — Patient Instructions (Signed)
Medication Instructions:   *If you need a refill on your cardiac medications before your next appointment, please call your pharmacy*   Lab Work:  If you have labs (blood work) drawn today and your tests are completely normal, you will receive your results only by: MyChart Message (if you have MyChart) OR A paper copy in the mail If you have any lab test that is abnormal or we need to change your treatment, we will call you to review the results.   Testing/Procedures:    Follow-Up: At [facility], you and your health needs are our priority.  As part of our continuing mission to provide you with exceptional heart care, we have created designated Provider Care Teams.  These Care Teams include your primary Cardiologist (physician) and Advanced Practice Providers (APPs -  Physician Assistants and Nurse Practitioners) who all work together to provide you with the care you need, when you need it.  We recommend signing up for the patient portal called "MyChart".  Sign up information is provided on this After Visit Summary.  MyChart is used to connect with patients for Virtual Visits (Telemedicine).  Patients are able to view lab/test results, encounter notes, upcoming appointments, etc.  Non-urgent messages can be sent to your provider as well.   To learn more about what you can do with MyChart, go to [email or web address].    Your next appointment:  Ronie Spies PA 3 months and Dr Izora Ribas in 6 months

## 2023-05-11 ENCOUNTER — Ambulatory Visit: Payer: Medicare HMO | Admitting: Hematology and Oncology

## 2023-05-12 DIAGNOSIS — M47816 Spondylosis without myelopathy or radiculopathy, lumbar region: Secondary | ICD-10-CM | POA: Diagnosis not present

## 2023-05-15 ENCOUNTER — Other Ambulatory Visit (HOSPITAL_COMMUNITY): Payer: Medicare HMO

## 2023-05-15 DIAGNOSIS — X32XXXD Exposure to sunlight, subsequent encounter: Secondary | ICD-10-CM | POA: Diagnosis not present

## 2023-05-15 DIAGNOSIS — D485 Neoplasm of uncertain behavior of skin: Secondary | ICD-10-CM | POA: Diagnosis not present

## 2023-05-15 DIAGNOSIS — Z1283 Encounter for screening for malignant neoplasm of skin: Secondary | ICD-10-CM | POA: Diagnosis not present

## 2023-05-15 DIAGNOSIS — L570 Actinic keratosis: Secondary | ICD-10-CM | POA: Diagnosis not present

## 2023-05-15 DIAGNOSIS — D225 Melanocytic nevi of trunk: Secondary | ICD-10-CM | POA: Diagnosis not present

## 2023-06-16 DIAGNOSIS — M47816 Spondylosis without myelopathy or radiculopathy, lumbar region: Secondary | ICD-10-CM | POA: Diagnosis not present

## 2023-06-30 DIAGNOSIS — F0390 Unspecified dementia without behavioral disturbance: Secondary | ICD-10-CM | POA: Diagnosis not present

## 2023-06-30 DIAGNOSIS — J069 Acute upper respiratory infection, unspecified: Secondary | ICD-10-CM | POA: Diagnosis not present

## 2023-06-30 DIAGNOSIS — D472 Monoclonal gammopathy: Secondary | ICD-10-CM | POA: Diagnosis not present

## 2023-06-30 DIAGNOSIS — I10 Essential (primary) hypertension: Secondary | ICD-10-CM | POA: Diagnosis not present

## 2023-06-30 DIAGNOSIS — R051 Acute cough: Secondary | ICD-10-CM | POA: Diagnosis not present

## 2023-07-09 DIAGNOSIS — M47816 Spondylosis without myelopathy or radiculopathy, lumbar region: Secondary | ICD-10-CM | POA: Diagnosis not present

## 2023-07-30 DIAGNOSIS — M47816 Spondylosis without myelopathy or radiculopathy, lumbar region: Secondary | ICD-10-CM | POA: Diagnosis not present

## 2023-08-03 DIAGNOSIS — M810 Age-related osteoporosis without current pathological fracture: Secondary | ICD-10-CM | POA: Diagnosis not present

## 2023-08-03 DIAGNOSIS — E785 Hyperlipidemia, unspecified: Secondary | ICD-10-CM | POA: Diagnosis not present

## 2023-08-03 DIAGNOSIS — R7301 Impaired fasting glucose: Secondary | ICD-10-CM | POA: Diagnosis not present

## 2023-08-03 DIAGNOSIS — I10 Essential (primary) hypertension: Secondary | ICD-10-CM | POA: Diagnosis not present

## 2023-08-03 DIAGNOSIS — E876 Hypokalemia: Secondary | ICD-10-CM | POA: Diagnosis not present

## 2023-08-03 DIAGNOSIS — Z125 Encounter for screening for malignant neoplasm of prostate: Secondary | ICD-10-CM | POA: Diagnosis not present

## 2023-08-03 DIAGNOSIS — E041 Nontoxic single thyroid nodule: Secondary | ICD-10-CM | POA: Diagnosis not present

## 2023-08-03 DIAGNOSIS — E559 Vitamin D deficiency, unspecified: Secondary | ICD-10-CM | POA: Diagnosis not present

## 2023-08-03 DIAGNOSIS — E538 Deficiency of other specified B group vitamins: Secondary | ICD-10-CM | POA: Diagnosis not present

## 2023-08-07 ENCOUNTER — Ambulatory Visit: Payer: Medicare HMO | Admitting: Physician Assistant

## 2023-08-10 DIAGNOSIS — I6990 Unspecified sequelae of unspecified cerebrovascular disease: Secondary | ICD-10-CM | POA: Diagnosis not present

## 2023-08-10 DIAGNOSIS — R82998 Other abnormal findings in urine: Secondary | ICD-10-CM | POA: Diagnosis not present

## 2023-08-10 DIAGNOSIS — E559 Vitamin D deficiency, unspecified: Secondary | ICD-10-CM | POA: Diagnosis not present

## 2023-08-10 DIAGNOSIS — I2510 Atherosclerotic heart disease of native coronary artery without angina pectoris: Secondary | ICD-10-CM | POA: Diagnosis not present

## 2023-08-10 DIAGNOSIS — D472 Monoclonal gammopathy: Secondary | ICD-10-CM | POA: Diagnosis not present

## 2023-08-10 DIAGNOSIS — M810 Age-related osteoporosis without current pathological fracture: Secondary | ICD-10-CM | POA: Diagnosis not present

## 2023-08-10 DIAGNOSIS — Z0000 Encounter for general adult medical examination without abnormal findings: Secondary | ICD-10-CM | POA: Diagnosis not present

## 2023-08-10 DIAGNOSIS — N179 Acute kidney failure, unspecified: Secondary | ICD-10-CM | POA: Diagnosis not present

## 2023-08-10 DIAGNOSIS — R1310 Dysphagia, unspecified: Secondary | ICD-10-CM | POA: Diagnosis not present

## 2023-08-10 DIAGNOSIS — F0390 Unspecified dementia without behavioral disturbance: Secondary | ICD-10-CM | POA: Diagnosis not present

## 2023-08-10 DIAGNOSIS — I493 Ventricular premature depolarization: Secondary | ICD-10-CM | POA: Diagnosis not present

## 2023-08-10 DIAGNOSIS — E785 Hyperlipidemia, unspecified: Secondary | ICD-10-CM | POA: Diagnosis not present

## 2023-08-10 DIAGNOSIS — I700 Atherosclerosis of aorta: Secondary | ICD-10-CM | POA: Diagnosis not present

## 2023-08-13 ENCOUNTER — Ambulatory Visit: Payer: Medicare HMO | Admitting: Neurology

## 2023-08-13 ENCOUNTER — Encounter: Payer: Self-pay | Admitting: Neurology

## 2023-08-13 VITALS — BP 124/62 | HR 67 | Ht 70.0 in | Wt 181.0 lb

## 2023-08-13 DIAGNOSIS — R269 Unspecified abnormalities of gait and mobility: Secondary | ICD-10-CM | POA: Diagnosis not present

## 2023-08-13 DIAGNOSIS — F03918 Unspecified dementia, unspecified severity, with other behavioral disturbance: Secondary | ICD-10-CM

## 2023-08-13 DIAGNOSIS — I679 Cerebrovascular disease, unspecified: Secondary | ICD-10-CM | POA: Diagnosis not present

## 2023-08-13 NOTE — Progress Notes (Signed)
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, because of his hallucination, orthostatic symptoms, neuropsychiatric evaluation also raise the possibility of Lewy body dementia,  Discussed with patient and his wife, they desire further evaluation to clarify diagnosis, proceed with [facility]  Encouraged him moderate exercise, refer to physical therapy,  continue Aricept and Namenda  Cerebrovascular disease  MRI of the brain in 2022 showed advanced small vessel disease,  Vascular risk factor of hypertension, hyperlipidemia, on aspirin daily  DIAGNOSTIC DATA (LABS, IMAGING, TESTING) - I reviewed patient records, labs, notes, testing and imaging myself where available.  HISTORY MARKEVIOUS EHMKE is a 83 year old male, accompanied by his [name], seen in request by his primary care physician Dr. Rodrigo Ran for evaluation of mental status change, memory loss, generalized weakness, initial evaluation was on 08/01/2020,   I reviewed and summarized the referring note.  Past medical history Hyperlipidemia Prostate hypertrophy Anxiety   He has been working for over 40 years, had his own insurance business, his daughter-in-law works with him, he was noted to have gradual onset of memory loss over the past couple years, become obvious since September 2021, daughter recorded Mini-Mental Status Examination by his primary care physician was 24/30, he was noted to have slow reaction time, need longer time to finish conversation, often stop in the middle of the sentence.   This has become much more obvious since his hospital admission from January 1-13, 2022, before hospital admission, he developed hives on different part of his body, he has to take  Zyrtec at home, with no help, was not able to sleep well due to itching rash, on the day he was admitted to the hospital on June 30, 2020, he was found to have low-grade fever, significant confusion, difficulty getting out of the bed, slurred speech, paramedic brought him to the emergency room,   Emergency room, fever, temperature 100.5, C-reactive protein 12.6,   CT angiogram of head and neck showed no significant large vessel disease   MRI of the brain on 07/02/2020, compared to previous MRI in December 2021, in September 2021, no acute abnormality, advanced supratentorium, periventricular, right paramedian pons T2 flair signal abnormality, most consistent with small vessel disease,   Laboratory evaluations: CMP, calcium slightly decreased 8.8, glucose was mildly elevated 108, CBC showed hemoglobin of 11.1, ammonia 16, cyclic citrul peptide antibody were negative, hepatitis B surface antigen, HCV antibody, hepatitis a IgM, hepatitis B C IgM were all negative, pro calcitonin was normal, rheumatoid factor was elevated at 30.5, double-stranded DNA was negative, ANA was negative, CMV PCR, EBV DNA PCR, Lyme titer, varicella-zoster PCR, HIV, vitamin B1, vitamin D, RPR, ammonia, B12, folic acid, TSH, were all normal, A1c was 5.1, ESR was normal 8, negative UDS, the only abnormality was mildly decreased B12 level 221, is on B12 supplement.   CSF study: WBC 1, RBC 2, total protein of 41, glucose of 67, HSV 1/2 PCR negative   Extensive [facility] CSF encephalopathy-autoimmune panels were negative   He also had a prior  - ---hospital admission on December 3-6, 2021, hospital admission for increased confusion, speech difficulty, unsteady on his feet, fidgety,   During today's interview, patient reported he is back to his baseline, wife and daughter reported that  he is 75% back to his baseline, was noted to have significant memory loss, Mini-Mental Status Examination is 23 out of 30, often stopping the middle  of the sentence, change subject, slow reaction time,   UPDATE February 11 2021: He is accompanied by his wife at today's visit, his daughter is on the phone, he is closing his business, over the past few months, he was noted to have increased difficulty, difficulty finishing up his symptoms, difficulty following through the conversation, sleeps a lot, he used to be very active, he is no longer motivated, during the day, he watches TV most of the time, take long naps during the day, has good appetite, he can still dress, tolerating, bathing himself, mild unsteady gait  UPDATE Aug 13 2023: Mauri Pole for neuropsychology evaluation,  Revealed deficits in most domains assessed, including on measures of attention, information processing efficiency, language, visuospatial/constructional functioning, and executive functioning. Qualitatively, he has profound visuoconstructional impairment, which seems out of proportion to his difficulty in other areas. Despite numerous low scores, memory was an area of relative preservation with good retention of information across time. He screened negative for the presence of depression and his wife rated him as functioning at a mild dementia level of impairment on an informant report severity status staging scale.  Taken together, findings of the evaluation suggest a dementia level problem currently at a late mild to early moderate level of progression. 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  He and his wife recently moved to assisted living, still unpacking, which has been very stressful, wife think the stress might contributed to his transient worsening including dizziness, nightmare, hallucination, which seems to have some improvement,   he sleeps well, no longer have frequent  dizziness, had low back pain which has helped by pain management, still has mild unsteady gait,  He is taking Namenda 10 mg twice a day Aricept 10 mg daily, tolerating it well   PHYSICAL EXAM  Vitals:   08/13/23 1338  BP: 124/62  Pulse: 67  Weight: 181 lb (82.1 kg)  Height: 5\' 10"  (1.778 m)    Body mass index is 25.97 kg/m.  Sitting down 161/66, HR 58,  Standing up  145/70, HR 59,  Standing up x3 mins, 145/71, HR 61.   Generalized: Well developed, in no acute distress      08/13/2023    1:40 PM 02/04/2023    8:47 AM 07/30/2022   10:00 AM  MMSE - Mini Mental State Exam  Orientation to time 3 3 4   Orientation to Place 5 5 5   Registration 3 2 3   Attention/ Calculation 4 2 4   Recall 3 2 0  Language- name 2 objects 2 2 2   Language- repeat 1 1 1   Language- follow 3 step command 3 3 3   Language- read & follow direction 1 1 1   Write a sentence 1 0 1  Copy design 0 0 0  Total score 26 21 24    PHYSICAL EXAMNIATION:  Gen: NAD, conversant, well nourised, well groomed                     Cardiovascular: Regular rate rhythm, no peripheral edema, warm, nontender. Eyes: Conjunctivae clear without exudates or hemorrhage Neck: Supple, no carotid bruits. Pulmonary: Clear to auscultation bilaterally   NEUROLOGICAL EXAM:  MENTAL STATUS: Speech/cognition: Awake, alert oriented to  history taking and casual conversation  CRANIAL NERVES: CN II: Visual fields are full to confrontation.  Pupils are round equal and briskly reactive to light. CN III, IV, VI: extraocular movement are normal. No ptosis. CN V: Facial sensation is intact to pinprick in all 3 divisions bilaterally. Corneal responses are intact.  CN VII: Face is symmetric with normal eye closure and smile. CN VIII: Hearing is normal to casual conversation CN IX, X: Palate elevates symmetrically. Phonation is normal. CN XI: Head turning and shoulder shrug are intact CN XII: Tongue is midline with normal movements and no  atrophy.  MOTOR: There is no pronator drift of out-stretched arms. Muscle bulk and tone are normal. Muscle strength is normal.  REFLEXES: Reflexes are 1 and symmetric at the biceps, triceps, knees, and ankles. Plantar responses are flexor.  SENSORY: Intact to light touch, pinprick, positional and vibratory sensation are intact in fingers and toes.  COORDINATION: Rapid alternating movements and fine finger movements are intact. There is no dysmetria on finger-to-nose and heel-knee-shin.    GAIT/STANCE: Push-up to get up from seated position, cautious mildly unsteady  REVIEW OF SYSTEMS: Out of a complete 14 system review of symptoms, the patient complains only of the following symptoms, and all other reviewed systems are negative.  See HPI  ALLERGIES: Allergies  Allergen Reactions   Fludrocortisone     Other Reaction(s): acute renal failure    HOME MEDICATIONS: Outpatient Medications Prior to Visit  Medication Sig Dispense Refill   acetaminophen (TYLENOL) 500 MG tablet Take 1,000 mg by mouth every 6 (six) hours as needed for headache (pain).     aspirin EC 81 MG tablet Take 1 tablet (81 mg total) by mouth daily. 90 tablet 3   buPROPion (WELLBUTRIN XL) 150 MG 24 hr tablet Take 150 mg by mouth every morning.     carboxymethylcellulose (REFRESH TEARS) 0.5 % SOLN Place 1 drop into both eyes 3 (three) times daily as needed (dry eyes).     cholecalciferol (VITAMIN D3) 25 MCG (1000 UNIT) tablet Take 1,000 Units by mouth See admin instructions. Take 1000 mg 5 days a week, Sun through [employer]     Cyanocobalamin (B-12) 3000 MCG CAPS Take 3,000 mcg by mouth daily.     donepezil (ARICEPT) 10 MG tablet Take 1 tablet (10 mg total) by mouth at bedtime. 90 tablet 4   escitalopram (LEXAPRO) 20 MG tablet Take 20 mg by mouth every morning.     fludrocortisone (FLORINEF) 0.1 MG tablet Take 1 tablet (0.1 mg total) by mouth every other day. 15 tablet 3   melatonin 5 MG TABS Take 5 mg by mouth at  bedtime.     memantine (NAMENDA) 10 MG tablet Take 1 tablet (10 mg total) by mouth 2 (two) times daily. 180 tablet 4   Multiple Vitamin (MULTIVITAMIN) tablet Take 1 tablet by mouth daily.     rosuvastatin (CRESTOR) 20 MG tablet Take 20 mg by mouth daily.     zoledronic acid (RECLAST) 5 MG/100ML SOLN injection Inject 5 mg into the vein once.     No facility-administered medications prior to visit.    PAST MEDICAL HISTORY: Past Medical History:  Diagnosis Date   Anxiety    Aortic atherosclerosis (HCC)    BPH (benign prostatic hyperplasia)    Carotid atherosclerosis    Cerumen impaction    Coronary artery calcification    Dyslipidemia    Frequent PVCs    H/O lithotripsy    Hearing decreased, bilateral  Hx of colonic polyps    IFG (impaired fasting glucose)    Macrocytic anemia    Macrocytosis    Memory loss    MGUS (monoclonal gammopathy of unknown significance)    Nephrolithiasis    TIA (transient ischemic attack)    Vitamin D deficiency     PAST SURGICAL HISTORY: Past Surgical History:  Procedure Laterality Date   BALLOON DILATION Bilateral 08/20/2022   Procedure: BALLOON DILATION;  Surgeon: Willis Modena, MD;  Location: [facility];  Service: Gastroenterology;  Laterality: Bilateral;   BIOPSY  08/20/2022   Procedure: BIOPSY;  Surgeon: Willis Modena, MD;  Location: [facility];  Service: Gastroenterology;;   BOTOX INJECTION N/A 08/20/2022   Procedure: BOTOX INJECTION;  Surgeon: Willis Modena, MD;  Location: [facility];  Service: Gastroenterology;  Laterality: N/A;  100 units   CATARACT EXTRACTION, BILATERAL     ESOPHAGEAL MANOMETRY N/A 04/16/2022   Procedure: ESOPHAGEAL MANOMETRY (EM);  Surgeon: Willis Modena, MD;  Location: [facility];  Service: Gastroenterology;  Laterality: N/A;   ESOPHAGOGASTRODUODENOSCOPY (EGD) WITH PROPOFOL Bilateral 08/20/2022   Procedure: ESOPHAGOGASTRODUODENOSCOPY (EGD) WITH PROPOFOL;  Surgeon: Willis Modena, MD;  Location: [facility];  Service: Gastroenterology;  Laterality: Bilateral;   PROSTATECTOMY     Partial    FAMILY HISTORY: Family History  Problem Relation Age of Onset   Leukemia Mother    Breast cancer Mother    Other Father        unknown medical history   Sudden death Neg Hx    Hypertension Neg Hx    Hyperlipidemia Neg Hx    Diabetes Neg Hx    Heart attack Neg Hx     SOCIAL HISTORY: Social History   Socioeconomic History   Marital status: Married    Spouse name: Harriett Sine   Number of children: 2   Years of education: college   Highest education level: Bachelor's degree (e.g., BA, AB, BS)  Occupational History   Occupation: Insurance   Tobacco Use   Smoking status: Never   Smokeless tobacco: Never  Vaping Use   Vaping status: Never Used  Substance and Sexual Activity   Alcohol use: Not Currently   Drug use: Never   Sexual activity: Not Currently  Other Topics Concern   Not on file  Social History Narrative   Lives at home with his wife.   Left-handed.   Two cups caffeine per day.   Social Drivers of Corporate investment banker Strain: Not on file  Food Insecurity: Not on file  Transportation Needs: Not on file  Physical Activity: Not on file  Stress: Not on file  Social Connections: Not on file  Intimate Partner Violence: Not on file   Levert Feinstein. M.D. Ph.D.

## 2023-08-21 DIAGNOSIS — D224 Melanocytic nevi of scalp and neck: Secondary | ICD-10-CM | POA: Diagnosis not present

## 2023-08-24 ENCOUNTER — Telehealth: Payer: Self-pay | Admitting: Neurology

## 2023-08-24 NOTE — Telephone Encounter (Signed)
 Pt's wife, Cale Bethard (on [place]) said having brain scan on March 6  Tech informed us he needs to stop the buPROPion (WELLBUTRIN XL) 150 MG 24 hr tablet because interferes with the brain scan. Can continue after the brain scan. Would like a call back discuss if ok to stop medication for the scan

## 2023-08-25 NOTE — Telephone Encounter (Signed)
 Yes ok to follow instruction by radiology department

## 2023-08-25 NOTE — Telephone Encounter (Signed)
 Returned call and spoke to Patrick Macias and stated:   Yes ok to follow instruction by radiology department      She voiced gratitude and understanding

## 2023-08-31 DIAGNOSIS — M47816 Spondylosis without myelopathy or radiculopathy, lumbar region: Secondary | ICD-10-CM | POA: Diagnosis not present

## 2023-08-31 DIAGNOSIS — M48062 Spinal stenosis, lumbar region with neurogenic claudication: Secondary | ICD-10-CM | POA: Diagnosis not present

## 2023-09-02 ENCOUNTER — Inpatient Hospital Stay: Payer: Medicare HMO | Attending: Hematology and Oncology

## 2023-09-02 DIAGNOSIS — D472 Monoclonal gammopathy: Secondary | ICD-10-CM | POA: Insufficient documentation

## 2023-09-02 DIAGNOSIS — D539 Nutritional anemia, unspecified: Secondary | ICD-10-CM | POA: Insufficient documentation

## 2023-09-02 DIAGNOSIS — R2689 Other abnormalities of gait and mobility: Secondary | ICD-10-CM | POA: Diagnosis not present

## 2023-09-02 DIAGNOSIS — F03918 Unspecified dementia, unspecified severity, with other behavioral disturbance: Secondary | ICD-10-CM | POA: Diagnosis not present

## 2023-09-02 DIAGNOSIS — M6281 Muscle weakness (generalized): Secondary | ICD-10-CM | POA: Diagnosis not present

## 2023-09-02 DIAGNOSIS — R2681 Unsteadiness on feet: Secondary | ICD-10-CM | POA: Diagnosis not present

## 2023-09-03 ENCOUNTER — Ambulatory Visit (HOSPITAL_COMMUNITY)
Admission: RE | Admit: 2023-09-03 | Discharge: 2023-09-03 | Disposition: A | Discharge status: Home / Self Care | Payer: Medicare HMO | Source: Ambulatory Visit | Attending: Neurology | Admitting: Neurology

## 2023-09-03 DIAGNOSIS — F03918 Unspecified dementia, unspecified severity, with other behavioral disturbance: Secondary | ICD-10-CM | POA: Diagnosis not present

## 2023-09-03 DIAGNOSIS — I679 Cerebrovascular disease, unspecified: Secondary | ICD-10-CM | POA: Diagnosis not present

## 2023-09-03 DIAGNOSIS — G3183 Dementia with Lewy bodies: Secondary | ICD-10-CM | POA: Diagnosis not present

## 2023-09-03 MED ORDER — POTASSIUM IODIDE (ANTIDOTE) 130 MG PO TABS
ORAL_TABLET | ORAL | Status: AC
  Filled 2023-09-03: qty 1

## 2023-09-03 MED ORDER — IOFLUPANE I 123 185 MBQ/2.5ML IV SOLN
4.5000 | Freq: Once | INTRAVENOUS | Status: AC | PRN
  Administered 2023-09-03: 4.5 via INTRAVENOUS

## 2023-09-04 ENCOUNTER — Encounter: Payer: Self-pay | Admitting: Neurology

## 2023-09-04 DIAGNOSIS — F03918 Unspecified dementia, unspecified severity, with other behavioral disturbance: Secondary | ICD-10-CM | POA: Diagnosis not present

## 2023-09-04 DIAGNOSIS — R2689 Other abnormalities of gait and mobility: Secondary | ICD-10-CM | POA: Diagnosis not present

## 2023-09-04 DIAGNOSIS — R2681 Unsteadiness on feet: Secondary | ICD-10-CM | POA: Diagnosis not present

## 2023-09-04 DIAGNOSIS — M6281 Muscle weakness (generalized): Secondary | ICD-10-CM | POA: Diagnosis not present

## 2023-09-07 DIAGNOSIS — R2689 Other abnormalities of gait and mobility: Secondary | ICD-10-CM | POA: Diagnosis not present

## 2023-09-07 DIAGNOSIS — R2681 Unsteadiness on feet: Secondary | ICD-10-CM | POA: Diagnosis not present

## 2023-09-07 DIAGNOSIS — F03918 Unspecified dementia, unspecified severity, with other behavioral disturbance: Secondary | ICD-10-CM | POA: Diagnosis not present

## 2023-09-07 DIAGNOSIS — M6281 Muscle weakness (generalized): Secondary | ICD-10-CM | POA: Diagnosis not present

## 2023-09-08 DIAGNOSIS — R2689 Other abnormalities of gait and mobility: Secondary | ICD-10-CM | POA: Diagnosis not present

## 2023-09-08 DIAGNOSIS — R2681 Unsteadiness on feet: Secondary | ICD-10-CM | POA: Diagnosis not present

## 2023-09-08 DIAGNOSIS — M6281 Muscle weakness (generalized): Secondary | ICD-10-CM | POA: Diagnosis not present

## 2023-09-08 DIAGNOSIS — F03918 Unspecified dementia, unspecified severity, with other behavioral disturbance: Secondary | ICD-10-CM | POA: Diagnosis not present

## 2023-09-10 DIAGNOSIS — M6281 Muscle weakness (generalized): Secondary | ICD-10-CM | POA: Diagnosis not present

## 2023-09-10 DIAGNOSIS — F03918 Unspecified dementia, unspecified severity, with other behavioral disturbance: Secondary | ICD-10-CM | POA: Diagnosis not present

## 2023-09-10 DIAGNOSIS — R2689 Other abnormalities of gait and mobility: Secondary | ICD-10-CM | POA: Diagnosis not present

## 2023-09-10 DIAGNOSIS — R2681 Unsteadiness on feet: Secondary | ICD-10-CM | POA: Diagnosis not present

## 2023-09-14 ENCOUNTER — Telehealth: Admitting: Neurology

## 2023-09-14 DIAGNOSIS — I679 Cerebrovascular disease, unspecified: Secondary | ICD-10-CM | POA: Diagnosis not present

## 2023-09-14 DIAGNOSIS — F03918 Unspecified dementia, unspecified severity, with other behavioral disturbance: Secondary | ICD-10-CM | POA: Diagnosis not present

## 2023-09-14 NOTE — Progress Notes (Signed)
 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  HISTORY ARMARION GREEK is a 83 year old male, accompanied by his [name], seen in request by his primary care physician Dr. Rodrigo Ran for evaluation of mental status change, memory loss, generalized weakness, initial evaluation was on 08/01/2020,   I reviewed and summarized the referring note.  Past medical history Hyperlipidemia Prostate hypertrophy Anxiety   He has been working for over 40 years, had his own insurance business, his daughter-in-law works with him, he was noted to have gradual onset of memory loss over the past couple years, become obvious since September 2021, daughter recorded Mini-Mental Status Examination by his primary care physician was 24/30, he was noted to have slow reaction time, need longer time to finish conversation, often stop in the middle of the sentence.   This has become much more obvious since his hospital admission from January 1-13, 2022, before  hospital admission, he developed hives on different part of his body, he has to take Zyrtec at home, with no help, was not able to sleep well due to itching rash, on the day he was admitted to the hospital on June 30, 2020, he was found to have low-grade fever, significant confusion, difficulty getting out of the bed, slurred speech, paramedic brought him to the emergency room,   Emergency room, fever, temperature 100.5, C-reactive protein 12.6,   CT angiogram of head and neck showed no significant large vessel disease   MRI of the brain on 07/02/2020, compared to previous MRI in December 2021, in September 2021, no acute abnormality, advanced supratentorium, periventricular, right paramedian pons T2 flair signal abnormality, most consistent with small vessel disease,   Laboratory evaluations: CMP, calcium slightly decreased 8.8, glucose was mildly elevated 108, CBC showed hemoglobin of 11.1, ammonia 16, cyclic citrul peptide antibody were negative, hepatitis B surface antigen, HCV antibody, hepatitis a IgM, hepatitis B C IgM were all negative, pro calcitonin was normal, rheumatoid factor was elevated at 30.5, double-stranded DNA was negative, ANA was negative, CMV PCR, EBV DNA PCR, Lyme titer, varicella-zoster PCR, HIV, vitamin B1, vitamin D, RPR, ammonia, B12, folic acid, TSH, were all normal, A1c was 5.1, ESR was normal 8, negative UDS, the only abnormality was mildly decreased B12 level 221, is on B12 supplement.   CSF study: WBC 1, RBC 2, total protein of 41, glucose of 67, HSV 1/2 PCR negative   Extensive [facility] CSF encephalopathy-autoimmune panels were negative   He also had a prior  - ---hospital admission on December 3-6, 2021, hospital admission for increased confusion, speech difficulty, unsteady on his feet, fidgety,   During today's interview, patient reported he is back to his baseline, wife and  daughter reported that he is 75% back to his baseline, was noted to have significant  memory loss, Mini-Mental Status Examination is 23 out of 30, often stopping the middle of the sentence, change subject, slow reaction time,   UPDATE February 11 2021: He is accompanied by his wife at today's visit, his daughter is on the phone, he is closing his business, over the past few months, he was noted to have increased difficulty, difficulty finishing up his symptoms, difficulty following through the conversation, sleeps a lot, he used to be very active, he is no longer motivated, during the day, he watches TV most of the time, take long naps during the day, has good appetite, he can still dress, tolerating, bathing himself, mild unsteady gait  UPDATE Aug 13 2023: Mauri Pole for neuropsychology evaluation,  Revealed deficits in most domains assessed, including on measures of attention, information processing efficiency, language, visuospatial/constructional functioning, and executive functioning. Qualitatively, he has profound visuoconstructional impairment, which seems out of proportion to his difficulty in other areas. Despite numerous low scores, memory was an area of relative preservation with good retention of information across time. He screened negative for the presence of depression and his wife rated him as functioning at a mild dementia level of impairment on an informant report severity status staging scale.  Taken together, findings of the evaluation suggest a dementia level problem currently at a late mild to early moderate level of progression. 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  He and his wife recently moved to assisted living, still unpacking, which has been very stressful, wife think the stress might contributed to his transient worsening including dizziness, nightmare,  hallucination, which seems to have some improvement,   he sleeps well, no longer have frequent dizziness, had low back pain which has helped by pain management, still has mild unsteady gait,  He is taking Namenda 10 mg twice a day Aricept 10 mg daily, tolerating it well   Virtual Visit via video UPDATE September 14 2023: I discussed the limitations of evaluation and management by telemedicine and the availability of in person appointments. The patient expressed understanding and agreed to proceed  Location: Provider: [facility] office; Patient: Home  I connected with Evon Slack  on September 14 2023  by a video enabled telemedicine application and verified that I am speaking with the correct person using two identifiers.  UPDATED HISTORY They feel settled in the independent living, he is overall doing well, independent in daily activity, showering, toileting, dressing, eating, without any difficulties  We reviewed DaTSCAN from March 2025, showed no parkinsonian syndrome pathology  MRI of the brain from 2022 showed generalized atrophy, moderate small vessel disease  Echocardiogram from 2024 showed no significant abnormality  Ultrasound of carotid artery from October 2024 showed no large vessel disease   Observations/Objective: I have reviewed problem lists, medications, allergies. Awake, alert, oriented to history taking and casual conversation  REVIEW OF SYSTEMS: Out of a complete 14 system review of symptoms, the patient complains only of the following symptoms, and all other reviewed systems are negative.  See HPI  ALLERGIES: Allergies  Allergen Reactions   Fludrocortisone     Other Reaction(s): acute renal failure    HOME MEDICATIONS: Outpatient Medications Prior to Visit  Medication Sig Dispense Refill   acetaminophen (TYLENOL) 500 MG tablet Take 1,000 mg by mouth every 6 (  six) hours as needed for headache (pain).     aspirin EC 81 MG tablet Take 1 tablet (81 mg total) by mouth  daily. 90 tablet 3   buPROPion (WELLBUTRIN XL) 150 MG 24 hr tablet Take 150 mg by mouth every morning.     carboxymethylcellulose (REFRESH TEARS) 0.5 % SOLN Place 1 drop into both eyes 3 (three) times daily as needed (dry eyes).     cholecalciferol (VITAMIN D3) 25 MCG (1000 UNIT) tablet Take 1,000 Units by mouth See admin instructions. Take 1000 mg 5 days a week, Sun through [employer]     Cyanocobalamin (B-12) 3000 MCG CAPS Take 3,000 mcg by mouth daily.     donepezil (ARICEPT) 10 MG tablet Take 1 tablet (10 mg total) by mouth at bedtime. 90 tablet 4   escitalopram (LEXAPRO) 20 MG tablet Take 20 mg by mouth every morning.     fludrocortisone (FLORINEF) 0.1 MG tablet Take 1 tablet (0.1 mg total) by mouth every other day. 15 tablet 3   melatonin 5 MG TABS Take 5 mg by mouth at bedtime.     memantine (NAMENDA) 10 MG tablet Take 1 tablet (10 mg total) by mouth 2 (two) times daily. 180 tablet 4   Multiple Vitamin (MULTIVITAMIN) tablet Take 1 tablet by mouth daily.     rosuvastatin (CRESTOR) 20 MG tablet Take 20 mg by mouth daily.     zoledronic acid (RECLAST) 5 MG/100ML SOLN injection Inject 5 mg into the vein once.     No facility-administered medications prior to visit.    PAST MEDICAL HISTORY: Past Medical History:  Diagnosis Date   Anxiety    Aortic atherosclerosis (HCC)    BPH (benign prostatic hyperplasia)    Carotid atherosclerosis    Cerumen impaction    Coronary artery calcification    Dyslipidemia    Frequent PVCs    H/O lithotripsy    Hearing decreased, bilateral    Hx of colonic polyps    IFG (impaired fasting glucose)    Macrocytic anemia    Macrocytosis    Memory loss    MGUS (monoclonal gammopathy of unknown significance)    Nephrolithiasis    TIA (transient ischemic attack)    Vitamin D deficiency     PAST SURGICAL HISTORY: Past Surgical History:  Procedure Laterality Date   BALLOON DILATION Bilateral 08/20/2022   Procedure: BALLOON DILATION;  Surgeon: Willis Modena, MD;  Location: [facility];  Service: Gastroenterology;  Laterality: Bilateral;   BIOPSY  08/20/2022   Procedure: BIOPSY;  Surgeon: Willis Modena, MD;  Location: [facility];  Service: Gastroenterology;;   BOTOX INJECTION N/A 08/20/2022   Procedure: BOTOX INJECTION;  Surgeon: Willis Modena, MD;  Location: [facility];  Service: Gastroenterology;  Laterality: N/A;  100 units   CATARACT EXTRACTION, BILATERAL     ESOPHAGEAL MANOMETRY N/A 04/16/2022   Procedure: ESOPHAGEAL MANOMETRY (EM);  Surgeon: Willis Modena, MD;  Location: [facility];  Service: Gastroenterology;  Laterality: N/A;   ESOPHAGOGASTRODUODENOSCOPY (EGD) WITH PROPOFOL Bilateral 08/20/2022   Procedure: ESOPHAGOGASTRODUODENOSCOPY (EGD) WITH PROPOFOL;  Surgeon: Willis Modena, MD;  Location: [facility];  Service: Gastroenterology;  Laterality: Bilateral;   PROSTATECTOMY     Partial    FAMILY HISTORY: Family History  Problem Relation Age of Onset   Leukemia Mother    Breast cancer Mother    Other Father        unknown medical history   Sudden death Neg Hx    Hypertension Neg Hx  Hyperlipidemia Neg Hx    Diabetes Neg Hx    Heart attack Neg Hx     SOCIAL HISTORY: Social History   Socioeconomic History   Marital status: Married    Spouse name: Harriett Sine   Number of children: 2   Years of education: college   Highest education level: Bachelor's degree (e.g., BA, AB, BS)  Occupational History   Occupation: Insurance   Tobacco Use   Smoking status: Never   Smokeless tobacco: Never  Vaping Use   Vaping status: Never Used  Substance and Sexual Activity   Alcohol use: Not Currently   Drug use: Never   Sexual activity: Not Currently  Other Topics Concern   Not on file  Social History Narrative   Lives at home with his wife.   Left-handed.   Two cups caffeine per day.   Social Drivers of Corporate investment banker Strain: Not on file  Food Insecurity: Not on file  Transportation Needs: Not on  file  Physical Activity: Not on file  Stress: Not on file  Social Connections: Not on file  Intimate Partner Violence: Not on file   Levert Feinstein. M.D. Ph.D.

## 2023-09-15 DIAGNOSIS — R2681 Unsteadiness on feet: Secondary | ICD-10-CM | POA: Diagnosis not present

## 2023-09-15 DIAGNOSIS — F03918 Unspecified dementia, unspecified severity, with other behavioral disturbance: Secondary | ICD-10-CM | POA: Diagnosis not present

## 2023-09-15 DIAGNOSIS — M6281 Muscle weakness (generalized): Secondary | ICD-10-CM | POA: Diagnosis not present

## 2023-09-15 DIAGNOSIS — R2689 Other abnormalities of gait and mobility: Secondary | ICD-10-CM | POA: Diagnosis not present

## 2023-09-16 ENCOUNTER — Inpatient Hospital Stay: Payer: Medicare HMO | Admitting: Hematology and Oncology

## 2023-09-16 ENCOUNTER — Inpatient Hospital Stay

## 2023-09-16 VITALS — BP 140/65 | HR 63 | Temp 97.7°F | Resp 13 | Wt 175.8 lb

## 2023-09-16 DIAGNOSIS — R2681 Unsteadiness on feet: Secondary | ICD-10-CM | POA: Diagnosis not present

## 2023-09-16 DIAGNOSIS — M6281 Muscle weakness (generalized): Secondary | ICD-10-CM | POA: Diagnosis not present

## 2023-09-16 DIAGNOSIS — D472 Monoclonal gammopathy: Secondary | ICD-10-CM

## 2023-09-16 DIAGNOSIS — R2689 Other abnormalities of gait and mobility: Secondary | ICD-10-CM | POA: Diagnosis not present

## 2023-09-16 DIAGNOSIS — D539 Nutritional anemia, unspecified: Secondary | ICD-10-CM | POA: Diagnosis not present

## 2023-09-16 DIAGNOSIS — F03918 Unspecified dementia, unspecified severity, with other behavioral disturbance: Secondary | ICD-10-CM | POA: Diagnosis not present

## 2023-09-16 LAB — CMP (CANCER CENTER ONLY)
ALT: 40 U/L (ref 0–44)
AST: 44 U/L — ABNORMAL HIGH (ref 15–41)
Albumin: 4.5 g/dL (ref 3.5–5.0)
Alkaline Phosphatase: 38 U/L (ref 38–126)
Anion gap: 4 — ABNORMAL LOW (ref 5–15)
BUN: 21 mg/dL (ref 8–23)
CO2: 30 mmol/L (ref 22–32)
Calcium: 9.2 mg/dL (ref 8.9–10.3)
Chloride: 107 mmol/L (ref 98–111)
Creatinine: 1.12 mg/dL (ref 0.61–1.24)
GFR, Estimated: >60 mL/min (ref >60)
Glucose, Bld: 93 mg/dL (ref 70–99)
Potassium: 4.1 mmol/L (ref 3.5–5.1)
Sodium: 141 mmol/L (ref 135–145)
Total Bilirubin: 0.9 mg/dL (ref 0.0–1.2)
Total Protein: 7.2 g/dL (ref 6.5–8.1)

## 2023-09-16 LAB — CBC WITH DIFFERENTIAL (CANCER CENTER ONLY)
Abs Immature Granulocytes: 0 10*3/uL (ref 0.00–0.07)
Basophils Absolute: 0 10*3/uL (ref 0.0–0.1)
Basophils Relative: 0 %
Eosinophils Absolute: 0.2 10*3/uL (ref 0.0–0.5)
Eosinophils Relative: 5 %
HCT: 37.5 % — ABNORMAL LOW (ref 39.0–52.0)
Hemoglobin: 12.7 g/dL — ABNORMAL LOW (ref 13.0–17.0)
Immature Granulocytes: 0 %
Lymphocytes Relative: 38 %
Lymphs Abs: 2 10*3/uL (ref 0.7–4.0)
MCH: 35.8 pg — ABNORMAL HIGH (ref 26.0–34.0)
MCHC: 33.9 g/dL (ref 30.0–36.0)
MCV: 105.6 fL — ABNORMAL HIGH (ref 80.0–100.0)
Monocytes Absolute: 0.5 10*3/uL (ref 0.1–1.0)
Monocytes Relative: 10 %
Neutro Abs: 2.4 10*3/uL (ref 1.7–7.7)
Neutrophils Relative %: 47 %
Platelet Count: 191 10*3/uL (ref 150–400)
RBC: 3.55 MIL/uL — ABNORMAL LOW (ref 4.22–5.81)
RDW: 12.7 % (ref 11.5–15.5)
WBC Count: 5.2 10*3/uL (ref 4.0–10.5)
nRBC: 0 % (ref 0.0–0.2)

## 2023-09-16 NOTE — Progress Notes (Signed)
 [facility] [phone]   [phone]  PROGRESS NOTE  Patient Care Team: Rodrigo Ran, MD as PCP - General (Internal Medicine) Lyn Records, MD (Inactive) as PCP - Cardiology (Cardiology) Mealor, Roberts Gaudy, MD as PCP - Electrophysiology (Cardiology) Jaci Standard, MD as Consulting Physician (Hematology and Oncology)  Hematological/Oncological History 1) Labs from PCP, Dr. Myer Haff from [facility].  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   2) 12/28/2020: Establish care with Georga Kaufmann PA-C  3) 01/18/2021: Bone marrow biopsy: Normocellular marrow with mild polytypic plasmocytosis.   CHIEF COMPLAINTS/PURPOSE OF CONSULTATION:  "Monoclonal gammopathy of unknown significance "  HISTORY OF PRESENTING ILLNESS:  Patrick Macias 83 y.o. male returns today for a follow up for monoclonal gammopathy.  Patient is accompanied by his wife for this visit.  On exam today, Patrick Macias reports he has been well overall and interim since her last visit 6 months ago.  He reports that he has had "more testing for dementia" in the absence her last visit.  The family has moved their home into independent living facility.  He is had no recent procedures anytime such as surgical dental procedures.  He has had no recent illnesses with cough, runny nose, or sore throat.  He denies any flu or COVID diagnoses.  Reports his energy levels do remain low.  He has trouble with his balance and working with physical therapy to improve this.  Fortunately he has had no falls.  He denies any lightheadedness, dizziness, but does have shortness of breath.  He does have chronic back pain which is currently being managed with ablations.  He reports is not having any bone pain elsewhere.  He also denies any urinary symptoms or issues.  Overall he is at his baseline level of health. He denies any fevers,  chills, night sweats, shortness of breath, chest pain, cough, rash, headaches. He has no other complaints.  Rest of the 10 point ROS is below.  MEDICAL HISTORY:  Past Medical History:  Diagnosis Date   Anxiety    Aortic atherosclerosis (HCC)    BPH (benign prostatic hyperplasia)    Carotid atherosclerosis    Cerumen impaction    Coronary artery calcification    Dyslipidemia    Frequent PVCs    H/O lithotripsy    Hearing decreased, bilateral    Hx of colonic polyps    IFG (impaired fasting glucose)    Macrocytic anemia    Macrocytosis    Memory loss    MGUS (monoclonal gammopathy of unknown significance)    Nephrolithiasis    TIA (transient ischemic attack)    Vitamin D deficiency     SURGICAL HISTORY: Past Surgical History:  Procedure Laterality Date   BALLOON DILATION Bilateral 08/20/2022   Procedure: BALLOON DILATION;  Surgeon: Willis Modena, MD;  Location: [facility];  Service: Gastroenterology;  Laterality: Bilateral;   BIOPSY  08/20/2022   Procedure: BIOPSY;  Surgeon: Willis Modena, MD;  Location: [facility];  Service: Gastroenterology;;   BOTOX INJECTION N/A 08/20/2022   Procedure: BOTOX INJECTION;  Surgeon: Willis Modena, MD;  Location: [facility];  Service: Gastroenterology;  Laterality: N/A;  100 units   CATARACT EXTRACTION, BILATERAL     ESOPHAGEAL MANOMETRY N/A 04/16/2022   Procedure: ESOPHAGEAL MANOMETRY (EM);  Surgeon: Willis Modena, MD;  Location: [facility];  Service: Gastroenterology;  Laterality: N/A;   ESOPHAGOGASTRODUODENOSCOPY (EGD) WITH PROPOFOL Bilateral 08/20/2022  Procedure: ESOPHAGOGASTRODUODENOSCOPY (EGD) WITH PROPOFOL;  Surgeon: Willis Modena, MD;  Location: [facility];  Service: Gastroenterology;  Laterality: Bilateral;   PROSTATECTOMY     Partial    SOCIAL HISTORY: Social History   Socioeconomic History   Marital status: Married    Spouse name: Patrick Macias   Number of children: 2   Years of education: college   Highest  education level: Bachelor's degree (e.g., BA, AB, BS)  Occupational History   Occupation: Insurance   Tobacco Use   Smoking status: Never   Smokeless tobacco: Never  Vaping Use   Vaping status: Never Used  Substance and Sexual Activity   Alcohol use: Not Currently   Drug use: Never   Sexual activity: Not Currently  Other Topics Concern   Not on file  Social History Narrative   Lives at home with his wife.   Left-handed.   Two cups caffeine per day.   Social Drivers of Corporate investment banker Strain: Not on file  Food Insecurity: Not on file  Transportation Needs: Not on file  Physical Activity: Not on file  Stress: Not on file  Social Connections: Not on file  Intimate Partner Violence: Not on file    FAMILY HISTORY: Family History  Problem Relation Age of Onset   Leukemia Mother    Breast cancer Mother    Other Father        unknown medical history   Sudden death Neg Hx    Hypertension Neg Hx    Hyperlipidemia Neg Hx    Diabetes Neg Hx    Heart attack Neg Hx     ALLERGIES:  is allergic to fludrocortisone.  MEDICATIONS:  Current Outpatient Medications  Medication Sig Dispense Refill   acetaminophen (TYLENOL) 500 MG tablet Take 1,000 mg by mouth every 6 (six) hours as needed for headache (pain).     aspirin EC 81 MG tablet Take 1 tablet (81 mg total) by mouth daily. 90 tablet 3   buPROPion (WELLBUTRIN XL) 150 MG 24 hr tablet Take 150 mg by mouth every morning.     carboxymethylcellulose (REFRESH TEARS) 0.5 % SOLN Place 1 drop into both eyes 3 (three) times daily as needed (dry eyes).     cholecalciferol (VITAMIN D3) 25 MCG (1000 UNIT) tablet Take 1,000 Units by mouth See admin instructions. Take 1000 mg 5 days a week, Sun through [employer]     Cyanocobalamin (B-12) 3000 MCG CAPS Take 3,000 mcg by mouth daily.     donepezil (ARICEPT) 10 MG tablet Take 1 tablet (10 mg total) by mouth at bedtime. 90 tablet 4   escitalopram (LEXAPRO) 20 MG tablet Take 20 mg by mouth  every morning.     fludrocortisone (FLORINEF) 0.1 MG tablet Take 1 tablet (0.1 mg total) by mouth every other day. 15 tablet 3   melatonin 5 MG TABS Take 5 mg by mouth at bedtime.     memantine (NAMENDA) 10 MG tablet Take 1 tablet (10 mg total) by mouth 2 (two) times daily. 180 tablet 4   Multiple Vitamin (MULTIVITAMIN) tablet Take 1 tablet by mouth daily.     rosuvastatin (CRESTOR) 20 MG tablet Take 20 mg by mouth daily.     zoledronic acid (RECLAST) 5 MG/100ML SOLN injection Inject 5 mg into the vein once.     No current facility-administered medications for this visit.    REVIEW OF SYSTEMS:   Constitutional: ( - ) fevers, ( - )  chills , ( - )  night sweats Eyes: ( - ) blurriness of vision, ( - ) double vision, ( - ) watery eyes Ears, nose, mouth, throat, and face: ( - ) mucositis, ( - ) sore throat Respiratory: ( - ) cough, ( +) dyspnea, ( - ) wheezes Cardiovascular: ( - ) palpitation, ( - ) chest discomfort, ( - ) lower extremity swelling Gastrointestinal:  ( - ) nausea, ( - ) heartburn, ( - ) change in bowel habits Skin: ( - ) abnormal skin rashes Lymphatics: ( - ) new lymphadenopathy, ( - ) easy bruising Neurological: ( - ) numbness, ( - ) tingling, ( - ) new weaknesses Behavioral/Psych: ( - ) mood change, ( - ) new changes  All other systems were reviewed with the patient and are negative.  PHYSICAL EXAMINATION: ECOG PERFORMANCE STATUS: 0 - Asymptomatic  Vitals:   09/16/23 1020  BP: (!) 140/65  Pulse: 63  Resp: 13  Temp: 97.7 F (36.5 C)  SpO2: 100%     Filed Weights   09/16/23 1020  Weight: 175 lb 12.8 oz (79.7 kg)      GENERAL: well appearing male in NAD  SKIN: skin color, texture, turgor are normal, no rashes or significant lesions EYES: conjunctiva are pink and non-injected, sclera clear OROPHARYNX: no exudate, no erythema; lips, buccal mucosa, and tongue normal  LYMPH:  no palpable lymphadenopathy in the cervical or supraclavicular lymph nodes.  LUNGS:  clear to auscultation and percussion with normal breathing effort HEART: regular rate & rhythm and no murmurs and no lower extremity edema Musculoskeletal: no cyanosis of digits and no clubbing  PSYCH: alert & oriented x 3, fluent speech NEURO: no focal motor/sensory deficits  LABORATORY DATA:  I have reviewed the data as listed    Latest Ref Rng & Units 09/16/2023   10:59 AM 03/31/2023   11:29 AM 03/05/2023    9:10 AM  CBC  WBC 4.0 - 10.5 K/uL 5.2  6.1  3.9   Hemoglobin 13.0 - 17.0 g/dL 81.1  91.4  78.2   Hematocrit 39.0 - 52.0 % 37.5  33.9  33.8   Platelets 150 - 400 K/uL 191  214  179        Latest Ref Rng & Units 09/16/2023   10:59 AM 04/07/2023   11:24 AM 03/31/2023   11:29 AM  CMP  Glucose 70 - 99 mg/dL 93  81  90   BUN 8 - 23 mg/dL 21  23  21    Creatinine 0.61 - 1.24 mg/dL 9.56  2.13  0.86   Sodium 135 - 145 mmol/L 141  141  142   Potassium 3.5 - 5.1 mmol/L 4.1  4.2  4.4   Chloride 98 - 111 mmol/L 107  105  106   CO2 22 - 32 mmol/L 30  24  22    Calcium 8.9 - 10.3 mg/dL 9.2  9.9  8.7   Total Protein 6.5 - 8.1 g/dL 7.2     Total Bilirubin 0.0 - 1.2 mg/dL 0.9     Alkaline Phos 38 - 126 U/L 38     AST 15 - 41 U/L 44     ALT 0 - 44 U/L 40      ASSESSMENT & PLAN KADAN MILLSTEIN  is a 83 y.o. male returns for a follow up for monoclonal gammopathy.    #IgG Kappa MGUS: --Bone marrow biopsy was recommended due to elevated serum free light chain ration of 2.46. Patient underwent biopsy on 01/18/2021. Pathology  revealed normocellular marrow with mild polytypic plasmocytosis. Findings consistent with MGUS.  PLAN: --Labs white blood cell 5.2, hemoglobin 12.7, MCV 105.6, platelets 191. Creatinine, LFTs and calcium levels are normal. SPEP showed overall stable M protein measuring 0.8 g/dL and kappa light chain 1.61, ratio 2.42.  SPEP from today pending --Most recent UPEP from 04/28/2022 showed bence jones proteins, kappa type.  --Most recent bone met survey from 03/18/2023 showed no  lytic lesions. -- Return to clinic in 1 year with labs 1 week before.  #Macrocytic anemia: --Hgb 12.7, MCV 105.6. Overall stable in the last several months.  --Vitamin B12, folate, and methylmalonic levels were checked on 04/02/2021 without any deficiencies.  --Continue to monitor.   No orders of the defined types were placed in this encounter.   All questions were answered. The patient knows to call the clinic with any problems, questions or concerns.  I have spent a total of 25 minutes minutes of face-to-face and non-face-to-face time, preparing to see the patient, performing a medically appropriate examination, counseling and educating the patient, ordering tests, documenting clinical information in the electronic health record, communicating results to the patient, and care coordination.    Ulysees Barns, MD Department of Hematology/Oncology [facility] Phone: [phone] Pager: [phone] Email: [email or web address]

## 2023-09-17 LAB — KAPPA/LAMBDA LIGHT CHAINS
Kappa free light chain: 31.9 mg/L — ABNORMAL HIGH (ref 3.3–19.4)
Kappa, lambda light chain ratio: 2.35 — ABNORMAL HIGH (ref 0.26–1.65)
Lambda free light chains: 13.6 mg/L (ref 5.7–26.3)

## 2023-09-18 DIAGNOSIS — R2681 Unsteadiness on feet: Secondary | ICD-10-CM | POA: Diagnosis not present

## 2023-09-18 DIAGNOSIS — R2689 Other abnormalities of gait and mobility: Secondary | ICD-10-CM | POA: Diagnosis not present

## 2023-09-18 DIAGNOSIS — M6281 Muscle weakness (generalized): Secondary | ICD-10-CM | POA: Diagnosis not present

## 2023-09-18 DIAGNOSIS — F03918 Unspecified dementia, unspecified severity, with other behavioral disturbance: Secondary | ICD-10-CM | POA: Diagnosis not present

## 2023-09-18 LAB — MULTIPLE MYELOMA PANEL, SERUM
Albumin SerPl Elph-Mcnc: 3.9 g/dL (ref 2.9–4.4)
Albumin/Glob SerPl: 1.4 (ref 0.7–1.7)
Alpha 1: 0.2 g/dL (ref 0.0–0.4)
Alpha2 Glob SerPl Elph-Mcnc: 0.6 g/dL (ref 0.4–1.0)
B-Globulin SerPl Elph-Mcnc: 0.8 g/dL (ref 0.7–1.3)
Gamma Glob SerPl Elph-Mcnc: 1.4 g/dL (ref 0.4–1.8)
Globulin, Total: 2.9 g/dL (ref 2.2–3.9)
IgA: 76 mg/dL (ref 61–437)
IgG (Immunoglobin G), Serum: 1430 mg/dL (ref 603–1613)
IgM (Immunoglobulin M), Srm: 75 mg/dL (ref 15–143)
M Protein SerPl Elph-Mcnc: 0.7 g/dL — ABNORMAL HIGH
Total Protein ELP: 6.8 g/dL (ref 6.0–8.5)

## 2023-09-22 DIAGNOSIS — F03918 Unspecified dementia, unspecified severity, with other behavioral disturbance: Secondary | ICD-10-CM | POA: Diagnosis not present

## 2023-09-22 DIAGNOSIS — R2689 Other abnormalities of gait and mobility: Secondary | ICD-10-CM | POA: Diagnosis not present

## 2023-09-22 DIAGNOSIS — M6281 Muscle weakness (generalized): Secondary | ICD-10-CM | POA: Diagnosis not present

## 2023-09-22 DIAGNOSIS — R2681 Unsteadiness on feet: Secondary | ICD-10-CM | POA: Diagnosis not present

## 2023-09-24 DIAGNOSIS — R2689 Other abnormalities of gait and mobility: Secondary | ICD-10-CM | POA: Diagnosis not present

## 2023-09-24 DIAGNOSIS — R2681 Unsteadiness on feet: Secondary | ICD-10-CM | POA: Diagnosis not present

## 2023-09-24 DIAGNOSIS — M6281 Muscle weakness (generalized): Secondary | ICD-10-CM | POA: Diagnosis not present

## 2023-09-24 DIAGNOSIS — F03918 Unspecified dementia, unspecified severity, with other behavioral disturbance: Secondary | ICD-10-CM | POA: Diagnosis not present

## 2023-09-25 ENCOUNTER — Telehealth: Payer: Self-pay | Admitting: Internal Medicine

## 2023-09-25 NOTE — Telephone Encounter (Signed)
 Spoke with pt's wife (per DPR) about pt's symptoms. Pt's wife said he had an episode this morning where he became very weak, lightheaded, shaky and wasn't able to hold the mail. Wife stated this has happened months before. Pt had weakness in both arms and knees during episode. Pt had no slurring of speech or facial drooping. Pt had no headache or loss of balance. Wife stated the patient rested for 15 minutes and started to feel better. Wife stated that pt had blood pressure checked at the clinic at their assisted living facility after the episode. BP was 106/68 sitting and 102/66 standing. HR was in the 90s and O2 was in the 90s. Wife stated the pt is not currently experiencing any symptoms. Wife stated the pt drinks water and had had a small amount of soup just before the episode happened. Wife was advised to have pt drink something other than water like Gatorade for electrolytes. Wife was also advised to have pt eat small salty snacks throughout the day. Wife was told that should pt become confused, have facial drooping, loss of balance, weakness (particularly on one side), chest pain or sob that she should take him to the ED. Wife was told to give [place] a call if she has questions or concerns. Wife of pt verbalized understanding. All questions, if any, were answered.

## 2023-09-25 NOTE — Telephone Encounter (Signed)
 Wife calling because patient got very weak, lightheaded, shaky, and wasn't able to hold the mail that was in his hand. Wife calling to speak with the nurse to see what needs to be down for patient. Please advise

## 2023-09-27 NOTE — Progress Notes (Deleted)
  Cardiology Office Note:  .   Date:  09/27/2023  ID:  Patrick Macias, DOB 1941/04/29, MRN [id number] PCP: Patrick Ran, MD  [facility] Providers Cardiologist:  Patrick Noe, MD (Inactive) Electrophysiologist:  Patrick Small, MD {  History of Present Illness: .   Patrick Macias is a 83 y.o. male w/PMHx of *** MGUS, microcytic anemia(follows with Patrick Macias) Dementia, CBP, TIA CAD  Symptomatic orthostatic hypotension  PVCs  Last saw Dr. Nelly Macias 03/19/23, f/u after switching metoprolol to propranolol,  Reported that Symptoms of fatigue and shortness of breath have gotten worse despite improvement in PVC burden --PVCs did not appear to be the cause >> planned for ETT to evaluate for possible chronotropic incompetence (NOT DONE)  Seeing cardiology team a couple times since then Last with Dr. Izora Macias 05/06/23 continued to feel unwell, SOB/DOE - orthostatic symptoms better, florinef continued (midodrine was not effective in symptom management) - no CP, discussed options for further w/u on his SOB, DOE, coronary ca++ deferred at the time of his visit Mentions unable to walk on treadmill -- no suppression meds for his PVCs given orthostatic issues -- dementia complicates symptom   Today's visit is scheduled as an annual visit                 ROS:   *** PVCs, palps, syncope *** otherwise with gen cards   Studies Reviewed: Marland Kitchen    EKG done today and reviewed by myself:  ***  TTE: 06/16/2022 EF 50-55%   TTE: 02/24/2023 EF 50-55%. Moderately dilated LA     Monitor: 5 day zio patch 04/2022 Sinus rhythm 25.6% PVC burden, occasionally occurring in bigeminy. There were a few epsiodes of NSVT   Monitor 12/13/2022   Patient had a minimum heart rate of 46 bpm, maximum heart rate of 106 bpm, and average heart rate of 51 bpm.   Predominant underlying rhythm was sinus rhythm.   One six beat run of slow NSVT, one 4 beat run of NSVT   Isolated PACs were occasional  (3.0%).   Isolated PVCs were frequent (11%).   No evidence of significant heart block .   No symptoms.   Lexiscan stress test: 07/17/2022   Lexiscan stress showed no EKG changes to suggest ischemia   Myoview scan showed normal perfusion   LVEF on gating calculated at 54% with normal wall motion  Visually, LVEF appears greater   Prior study available for comparison from 05/17/2019. no change     Risk Assessment/Calculations:    Physical Exam:   VS:  There were no vitals taken for this visit.   Wt Readings from Last 3 Encounters:  09/16/23 175 lb 12.8 oz (79.7 kg)  08/13/23 181 lb (82.1 kg)  05/06/23 181 lb (82.1 kg)    GEN: Well nourished, well developed in no acute distress NECK: No JVD; No carotid bruits CARDIAC: ***RRR, no murmurs, rubs, gallops RESPIRATORY:  *** CTA b/l without rales, wheezing or rhonchi  ABDOMEN: Soft, non-tender, non-distended EXTREMITIES: *** No edema; No deformity   ASSESSMENT AND PLAN: .    PVCs Orthostatic hypotension On florinef  ***     {Are you ordering a CV Procedure (e.g. stress test, cath, DCCV, TEE, etc)?   Press F2        :[id number]}     Dispo: ***  Signed, Patrick Pigeon, PA-C

## 2023-09-28 NOTE — Telephone Encounter (Signed)
 Spoke with pt's wife (per DPR) regarding Dr. Debby Bud suggestions. Wife was told to follow up with pt's neurologist if he has another similar episode. Wife verbalized understanding. All questions, if any, were answered.

## 2023-09-29 DIAGNOSIS — R2689 Other abnormalities of gait and mobility: Secondary | ICD-10-CM | POA: Diagnosis not present

## 2023-09-29 DIAGNOSIS — M6281 Muscle weakness (generalized): Secondary | ICD-10-CM | POA: Diagnosis not present

## 2023-09-29 DIAGNOSIS — R2681 Unsteadiness on feet: Secondary | ICD-10-CM | POA: Diagnosis not present

## 2023-09-29 DIAGNOSIS — F03918 Unspecified dementia, unspecified severity, with other behavioral disturbance: Secondary | ICD-10-CM | POA: Diagnosis not present

## 2023-09-30 ENCOUNTER — Ambulatory Visit: Attending: Physician Assistant | Admitting: Physician Assistant

## 2023-10-01 ENCOUNTER — Encounter: Payer: Self-pay | Admitting: Physician Assistant

## 2023-10-01 DIAGNOSIS — N200 Calculus of kidney: Secondary | ICD-10-CM | POA: Diagnosis not present

## 2023-10-01 DIAGNOSIS — R972 Elevated prostate specific antigen [PSA]: Secondary | ICD-10-CM | POA: Diagnosis not present

## 2023-10-02 DIAGNOSIS — R2689 Other abnormalities of gait and mobility: Secondary | ICD-10-CM | POA: Diagnosis not present

## 2023-10-02 DIAGNOSIS — R2681 Unsteadiness on feet: Secondary | ICD-10-CM | POA: Diagnosis not present

## 2023-10-02 DIAGNOSIS — M6281 Muscle weakness (generalized): Secondary | ICD-10-CM | POA: Diagnosis not present

## 2023-10-02 DIAGNOSIS — F03918 Unspecified dementia, unspecified severity, with other behavioral disturbance: Secondary | ICD-10-CM | POA: Diagnosis not present

## 2023-10-06 DIAGNOSIS — M47816 Spondylosis without myelopathy or radiculopathy, lumbar region: Secondary | ICD-10-CM | POA: Diagnosis not present

## 2023-10-06 DIAGNOSIS — G894 Chronic pain syndrome: Secondary | ICD-10-CM | POA: Diagnosis not present

## 2023-10-06 DIAGNOSIS — M719 Bursopathy, unspecified: Secondary | ICD-10-CM | POA: Diagnosis not present

## 2023-10-08 DIAGNOSIS — M6281 Muscle weakness (generalized): Secondary | ICD-10-CM | POA: Diagnosis not present

## 2023-10-08 DIAGNOSIS — F03918 Unspecified dementia, unspecified severity, with other behavioral disturbance: Secondary | ICD-10-CM | POA: Diagnosis not present

## 2023-10-08 DIAGNOSIS — R2689 Other abnormalities of gait and mobility: Secondary | ICD-10-CM | POA: Diagnosis not present

## 2023-10-08 DIAGNOSIS — R2681 Unsteadiness on feet: Secondary | ICD-10-CM | POA: Diagnosis not present

## 2023-10-21 DIAGNOSIS — R2689 Other abnormalities of gait and mobility: Secondary | ICD-10-CM | POA: Diagnosis not present

## 2023-10-21 DIAGNOSIS — M6281 Muscle weakness (generalized): Secondary | ICD-10-CM | POA: Diagnosis not present

## 2023-10-21 DIAGNOSIS — R2681 Unsteadiness on feet: Secondary | ICD-10-CM | POA: Diagnosis not present

## 2023-10-21 DIAGNOSIS — F03918 Unspecified dementia, unspecified severity, with other behavioral disturbance: Secondary | ICD-10-CM | POA: Diagnosis not present

## 2023-10-23 DIAGNOSIS — R2689 Other abnormalities of gait and mobility: Secondary | ICD-10-CM | POA: Diagnosis not present

## 2023-10-23 DIAGNOSIS — R2681 Unsteadiness on feet: Secondary | ICD-10-CM | POA: Diagnosis not present

## 2023-10-23 DIAGNOSIS — M6281 Muscle weakness (generalized): Secondary | ICD-10-CM | POA: Diagnosis not present

## 2023-10-23 DIAGNOSIS — F03918 Unspecified dementia, unspecified severity, with other behavioral disturbance: Secondary | ICD-10-CM | POA: Diagnosis not present

## 2023-10-26 ENCOUNTER — Telehealth: Payer: Self-pay | Admitting: Neurology

## 2023-10-26 NOTE — Telephone Encounter (Signed)
 We do not manage this condition, would kindly advise they discuss with care team managing the injections. Thanks

## 2023-10-26 NOTE — Telephone Encounter (Signed)
 Called and stated:  We do not manage this condition, would kindly advise they discuss with care team managing the injections.   Pt wife voiced understanding

## 2023-10-26 NOTE — Telephone Encounter (Signed)
 Pt's wife, Jakeb Camejo said, He has pain in back and neck. He only take Tylenol  and that is not working. His pain level is  9 or 10; he is in a lot of pain. Would like for neurologist to send a prescription for pain medication.

## 2023-10-26 NOTE — Telephone Encounter (Signed)
 Returned call to nancy (pt wife) who stated that the pt has had a back ablation and steroid shot in neck and still has no relief from the pain. He cannot ambulate very far due to the excruciating pain. He has been taking 2 tylenol  500mg  each BID and that hasn't helped either. The pain is sharp and constant unless he is completely still. They would like some form of medication for pain relief. Wife isn't sure if pain meds are ok with dementia meds namenda  and aricept .

## 2023-10-27 ENCOUNTER — Other Ambulatory Visit: Payer: Self-pay | Admitting: Physician Assistant

## 2023-10-27 DIAGNOSIS — R2681 Unsteadiness on feet: Secondary | ICD-10-CM | POA: Diagnosis not present

## 2023-10-27 DIAGNOSIS — R2689 Other abnormalities of gait and mobility: Secondary | ICD-10-CM | POA: Diagnosis not present

## 2023-10-27 DIAGNOSIS — F03918 Unspecified dementia, unspecified severity, with other behavioral disturbance: Secondary | ICD-10-CM | POA: Diagnosis not present

## 2023-10-27 DIAGNOSIS — M6281 Muscle weakness (generalized): Secondary | ICD-10-CM | POA: Diagnosis not present

## 2023-10-29 DIAGNOSIS — F03918 Unspecified dementia, unspecified severity, with other behavioral disturbance: Secondary | ICD-10-CM | POA: Diagnosis not present

## 2023-10-29 DIAGNOSIS — R2689 Other abnormalities of gait and mobility: Secondary | ICD-10-CM | POA: Diagnosis not present

## 2023-10-29 DIAGNOSIS — R2681 Unsteadiness on feet: Secondary | ICD-10-CM | POA: Diagnosis not present

## 2023-10-29 DIAGNOSIS — M6281 Muscle weakness (generalized): Secondary | ICD-10-CM | POA: Diagnosis not present

## 2023-10-30 DIAGNOSIS — F03918 Unspecified dementia, unspecified severity, with other behavioral disturbance: Secondary | ICD-10-CM | POA: Diagnosis not present

## 2023-10-30 DIAGNOSIS — R2689 Other abnormalities of gait and mobility: Secondary | ICD-10-CM | POA: Diagnosis not present

## 2023-10-30 DIAGNOSIS — M6281 Muscle weakness (generalized): Secondary | ICD-10-CM | POA: Diagnosis not present

## 2023-10-30 DIAGNOSIS — R2681 Unsteadiness on feet: Secondary | ICD-10-CM | POA: Diagnosis not present

## 2023-11-02 DIAGNOSIS — M419 Scoliosis, unspecified: Secondary | ICD-10-CM | POA: Diagnosis not present

## 2023-11-02 DIAGNOSIS — M549 Dorsalgia, unspecified: Secondary | ICD-10-CM | POA: Diagnosis not present

## 2023-11-02 DIAGNOSIS — M542 Cervicalgia: Secondary | ICD-10-CM | POA: Diagnosis not present

## 2023-11-02 DIAGNOSIS — M47812 Spondylosis without myelopathy or radiculopathy, cervical region: Secondary | ICD-10-CM | POA: Diagnosis not present

## 2023-11-02 DIAGNOSIS — M719 Bursopathy, unspecified: Secondary | ICD-10-CM | POA: Diagnosis not present

## 2023-11-04 DIAGNOSIS — M6281 Muscle weakness (generalized): Secondary | ICD-10-CM | POA: Diagnosis not present

## 2023-11-04 DIAGNOSIS — F03918 Unspecified dementia, unspecified severity, with other behavioral disturbance: Secondary | ICD-10-CM | POA: Diagnosis not present

## 2023-11-04 DIAGNOSIS — R2681 Unsteadiness on feet: Secondary | ICD-10-CM | POA: Diagnosis not present

## 2023-11-04 DIAGNOSIS — R2689 Other abnormalities of gait and mobility: Secondary | ICD-10-CM | POA: Diagnosis not present

## 2023-11-05 DIAGNOSIS — G894 Chronic pain syndrome: Secondary | ICD-10-CM | POA: Diagnosis not present

## 2023-11-05 DIAGNOSIS — M719 Bursopathy, unspecified: Secondary | ICD-10-CM | POA: Diagnosis not present

## 2023-11-06 DIAGNOSIS — M6281 Muscle weakness (generalized): Secondary | ICD-10-CM | POA: Diagnosis not present

## 2023-11-06 DIAGNOSIS — F03918 Unspecified dementia, unspecified severity, with other behavioral disturbance: Secondary | ICD-10-CM | POA: Diagnosis not present

## 2023-11-06 DIAGNOSIS — R2681 Unsteadiness on feet: Secondary | ICD-10-CM | POA: Diagnosis not present

## 2023-11-06 DIAGNOSIS — R2689 Other abnormalities of gait and mobility: Secondary | ICD-10-CM | POA: Diagnosis not present

## 2023-11-11 DIAGNOSIS — R2681 Unsteadiness on feet: Secondary | ICD-10-CM | POA: Diagnosis not present

## 2023-11-11 DIAGNOSIS — M6281 Muscle weakness (generalized): Secondary | ICD-10-CM | POA: Diagnosis not present

## 2023-11-11 DIAGNOSIS — R2689 Other abnormalities of gait and mobility: Secondary | ICD-10-CM | POA: Diagnosis not present

## 2023-11-11 DIAGNOSIS — F03918 Unspecified dementia, unspecified severity, with other behavioral disturbance: Secondary | ICD-10-CM | POA: Diagnosis not present

## 2023-11-13 DIAGNOSIS — R2681 Unsteadiness on feet: Secondary | ICD-10-CM | POA: Diagnosis not present

## 2023-11-13 DIAGNOSIS — F03918 Unspecified dementia, unspecified severity, with other behavioral disturbance: Secondary | ICD-10-CM | POA: Diagnosis not present

## 2023-11-13 DIAGNOSIS — R2689 Other abnormalities of gait and mobility: Secondary | ICD-10-CM | POA: Diagnosis not present

## 2023-11-13 DIAGNOSIS — M6281 Muscle weakness (generalized): Secondary | ICD-10-CM | POA: Diagnosis not present

## 2023-11-16 DIAGNOSIS — R2689 Other abnormalities of gait and mobility: Secondary | ICD-10-CM | POA: Diagnosis not present

## 2023-11-16 DIAGNOSIS — F03918 Unspecified dementia, unspecified severity, with other behavioral disturbance: Secondary | ICD-10-CM | POA: Diagnosis not present

## 2023-11-16 DIAGNOSIS — M6281 Muscle weakness (generalized): Secondary | ICD-10-CM | POA: Diagnosis not present

## 2023-11-16 DIAGNOSIS — R2681 Unsteadiness on feet: Secondary | ICD-10-CM | POA: Diagnosis not present

## 2023-11-18 DIAGNOSIS — M6281 Muscle weakness (generalized): Secondary | ICD-10-CM | POA: Diagnosis not present

## 2023-11-18 DIAGNOSIS — F03918 Unspecified dementia, unspecified severity, with other behavioral disturbance: Secondary | ICD-10-CM | POA: Diagnosis not present

## 2023-11-18 DIAGNOSIS — R2689 Other abnormalities of gait and mobility: Secondary | ICD-10-CM | POA: Diagnosis not present

## 2023-11-18 DIAGNOSIS — R2681 Unsteadiness on feet: Secondary | ICD-10-CM | POA: Diagnosis not present

## 2023-11-19 ENCOUNTER — Telehealth: Payer: Self-pay | Admitting: Internal Medicine

## 2023-11-19 NOTE — Telephone Encounter (Signed)
 Pt c/o BP issue: STAT if pt c/o blurred vision, one-sided weakness or slurred speech.  STAT if BP is GREATER than 180/120 TODAY.  STAT if BP is LESS than 90/60 and SYMPTOMATIC TODAY  1. What is your BP concern? BP keeps dropping  2. Have you taken any BP medication today?no  3. What are your last 5 BP readings?128/65  61 sitting 77/48 69 standing   4. Are you having any other symptoms (ex. Dizziness, headache, blurred vision, passed out)? Dizziness, shaking, weak

## 2023-11-19 NOTE — Telephone Encounter (Signed)
 Spoke with pt's wife over the phone who stated after the last time our office was contacted back in March, they contacted the pt's neurologist and the Florinef  was changed from 1 tablet every other day, to half a tablet every day. The pt has had 4 episodes today so far, 3 times yesterday. Dizziness is the only symptom. Will forward to Dr. Paulita Boss and covering to review.

## 2023-11-20 NOTE — Telephone Encounter (Signed)
 Spoke with Haskell Linker, patient's spouse, and shared Dr. Daril Edge response:  It sounds like the Florinef  1 tablet every other day was the best dose to manage his symptoms.  Let's return to this dose.    Haskell Linker states patient was having a lot of dizziness on this dose and is hesitant to return to it. She states when they decreased to the dose to 0.5 tablet daily this initially improved but has returned.  Haskell Linker also states patient has been having ongoing neck and back pain that has been difficult to manage. He current takes Advil BID and Tylenol  every 3-4 hours, which helps take the the edge off the pain. Haskell Linker is wondering if the pain could be contributing to patient's dizziness.  Haskell Linker would also like our pharmacy team to review patient's medication list to see if there may be any possible interactions between his meds that may be contributing to his dizziness.  Will forward to Dr. Paulita Boss and Pharm D.

## 2023-11-22 ENCOUNTER — Ambulatory Visit (INDEPENDENT_AMBULATORY_CARE_PROVIDER_SITE_OTHER): Admitting: Radiology

## 2023-11-22 ENCOUNTER — Ambulatory Visit

## 2023-11-22 ENCOUNTER — Ambulatory Visit
Admission: RE | Admit: 2023-11-22 | Discharge: 2023-11-22 | Disposition: A | Discharge status: Home / Self Care | Source: Ambulatory Visit | Attending: Internal Medicine | Admitting: Internal Medicine

## 2023-11-22 VITALS — BP 116/70 | Temp 97.8°F | Resp 17

## 2023-11-22 DIAGNOSIS — Y92009 Unspecified place in unspecified non-institutional (private) residence as the place of occurrence of the external cause: Secondary | ICD-10-CM | POA: Diagnosis not present

## 2023-11-22 DIAGNOSIS — R0781 Pleurodynia: Secondary | ICD-10-CM

## 2023-11-22 DIAGNOSIS — S2242XA Multiple fractures of ribs, left side, initial encounter for closed fracture: Secondary | ICD-10-CM | POA: Diagnosis not present

## 2023-11-22 DIAGNOSIS — W19XXXA Unspecified fall, initial encounter: Secondary | ICD-10-CM

## 2023-11-22 MED ORDER — TRAMADOL HCL 50 MG PO TABS
50.0000 mg | ORAL_TABLET | Freq: Three times a day (TID) | ORAL | 0 refills | Status: DC | PRN
Start: 2023-11-22 — End: 2023-12-08

## 2023-11-22 NOTE — Discharge Instructions (Addendum)
 You broke your 5th and 6th ribs after fall. Use incentive spirometer 5-10 breaths every hour while awake for the next 5 to 7 days to prevent pneumonia.  This will help to fully expand the lungs. Splint your abdomen with a pillow when coughing, sneezing, or laughing. Use the rib belt during the daytime to provide compression to the rib cage, take this off at nighttime.  You may take Tylenol  1000 mg every 6 hours as needed for pain.  You may take tramadol  1 pill every 8 hours as needed for severe pain, mostly take this medication at nighttime. Tramadol  is a narcotic medication and can cause drowsiness and increased confusion, therefore mostly give this to Brooke at nighttime.  Please schedule a follow-up appointment with primary care provider in the next 5 to 7 days for recheck.  If you develop fever, chills, cough, congestion, etc., please return to urgent care or follow-up with primary care sooner for recheck to ensure that Darry has not developed pneumonia due to lack of deep breaths because of rib cage pain from fall.  Go to ER for severe symptoms.

## 2023-11-22 NOTE — ED Provider Notes (Signed)
 [facility]    CSN: [id number] Arrival date & time: 11/22/23  0850      History   Chief Complaint Chief Complaint  Patient presents with   Fall    HPI Patrick Macias is a 83 y.o. male.   Patrick Macias is a 83 y.o. male presenting for chief complaint of Fall that happened 3 days ago on Thursday, Nov 19, 2023.  Patient was walking on carpet when his shoe accidentally caught the edge of the carpet causing him to trip and knock over a metal table onto the floor.  He then fell on top of the metal table striking his left lower rib cage.  He did not hit his head, did not pass out, and did not become nauseous or vomit after the injury.  He does not take blood thinning medications.  The area became minimally bruised after the fall to the left lower rib cage and he reports pain to the left lower rib cage with taking deep breaths and with movement.  Rest improves the pain.  He denies cough, congestion, fever, chills, and previous injury to the rib cage.  Denies shortness of breath, leg swelling, heart palpitations, and orthopnea.  He has been taking Tylenol  and ibuprofen as needed for pain with some relief. History is provided by both the patient and his spouse Patrick Macias at the bedside.  Patient has dementia and takes Aricept  and Namenda .  Patrick Macias (wife) states he is in his normal neurologic state of health without any acute changes in cognition since the fall.   Fall    Past Medical History:  Diagnosis Date   Anxiety    Aortic atherosclerosis (HCC)    BPH (benign prostatic hyperplasia)    Carotid atherosclerosis    Cerumen impaction    Coronary artery calcification    Dyslipidemia    Frequent PVCs    H/O lithotripsy    Hearing decreased, bilateral    Hx of colonic polyps    IFG (impaired fasting glucose)    Macrocytic anemia    Macrocytosis    Memory loss    MGUS (monoclonal gammopathy of unknown significance)    Nephrolithiasis    TIA (transient ischemic attack)     Vitamin D  deficiency     Patient Active Problem List   Diagnosis Date Noted   Gait abnormality 08/13/2023   Dementia with behavioral disturbance (HCC) 08/13/2023   PVC (premature ventricular contraction) 05/06/2023   NSVT (nonsustained ventricular tachycardia) (HCC) 05/06/2023   Macrocytic anemia 04/02/2021   MGUS (monoclonal gammopathy of unknown significance) 12/28/2020   Cerebral vascular disease 08/01/2020   Metabolic encephalopathy 06/30/2020   Cognitive impairment 06/04/2020   TIA (transient ischemic attack) 06/01/2020   Dementia without behavioral disturbance (HCC) 06/01/2020   Anxiety 06/01/2020   HTN (hypertension) 06/01/2020   Right shoulder pain 08/10/2013    Past Surgical History:  Procedure Laterality Date   BALLOON DILATION Bilateral 08/20/2022   Procedure: BALLOON DILATION;  Surgeon: Evangeline Hilts, MD;  Location: [facility];  Service: Gastroenterology;  Laterality: Bilateral;   BIOPSY  08/20/2022   Procedure: BIOPSY;  Surgeon: Evangeline Hilts, MD;  Location: [facility];  Service: Gastroenterology;;   BOTOX  INJECTION N/A 08/20/2022   Procedure: BOTOX  INJECTION;  Surgeon: Evangeline Hilts, MD;  Location: [facility];  Service: Gastroenterology;  Laterality: N/A;  100 units   CATARACT EXTRACTION, BILATERAL     ESOPHAGEAL MANOMETRY N/A 04/16/2022   Procedure: ESOPHAGEAL MANOMETRY (EM);  Surgeon: Evangeline Hilts, MD;  Location:  [facility];  Service: Gastroenterology;  Laterality: N/A;   ESOPHAGOGASTRODUODENOSCOPY (EGD) WITH PROPOFOL  Bilateral 08/20/2022   Procedure: ESOPHAGOGASTRODUODENOSCOPY (EGD) WITH PROPOFOL ;  Surgeon: Evangeline Hilts, MD;  Location: [facility];  Service: Gastroenterology;  Laterality: Bilateral;   PROSTATECTOMY     Partial       Home Medications    Prior to Admission medications   Medication Sig Start Date End Date Taking? Authorizing Provider  traMADol  (ULTRAM ) 50 MG tablet Take 1 tablet (50 mg total) by mouth every 8 (eight) hours  as needed. 11/22/23  Yes Starlene Eaton, FNP  acetaminophen  (TYLENOL ) 500 MG tablet Take 1,000 mg by mouth every 6 (six) hours as needed for headache (pain).    [provider]  aspirin  EC 81 MG tablet Take 1 tablet (81 mg total) by mouth daily. 05/11/19   Arty Binning, MD  buPROPion (WELLBUTRIN XL) 150 MG 24 hr tablet Take 150 mg by mouth every morning.    [provider]  carboxymethylcellulose (REFRESH TEARS) 0.5 % SOLN Place 1 drop into both eyes 3 (three) times daily as needed (dry eyes).    [provider]  cholecalciferol (VITAMIN D3) 25 MCG (1000 UNIT) tablet Take 1,000 Units by mouth See admin instructions. Take 1000 mg 5 days a week, Sun through [employer], Historical, MD  Cyanocobalamin  (B-12) 3000 MCG CAPS Take 3,000 mcg by mouth daily.    [provider]  donepezil  (ARICEPT ) 10 MG tablet Take 1 tablet (10 mg total) by mouth at bedtime. 02/04/23   Wess Hammed, NP  escitalopram  (LEXAPRO ) 20 MG tablet Take 20 mg by mouth every morning.    [provider]  fludrocortisone  (FLORINEF ) 0.1 MG tablet TAKE 1 TABLET BY MOUTH EVERY OTHER DAY 10/29/23   Chandrasekhar, Mahesh A, MD  melatonin 5 MG TABS Take 5 mg by mouth at bedtime.    [provider]  memantine  (NAMENDA ) 10 MG tablet Take 1 tablet (10 mg total) by mouth 2 (two) times daily. 02/04/23   Wess Hammed, NP  Multiple Vitamin (MULTIVITAMIN) tablet Take 1 tablet by mouth daily.    [provider]  rosuvastatin  (CRESTOR ) 20 MG tablet Take 20 mg by mouth daily. 05/03/19   [provider]  zoledronic  acid (RECLAST ) 5 MG/100ML SOLN injection Inject 5 mg into the vein once.    [provider]    Family History Family History  Problem Relation Age of Onset   Leukemia Mother    Breast cancer Mother    Other Father        unknown medical history   Sudden death Neg Hx    Hypertension Neg Hx    Hyperlipidemia Neg Hx    Diabetes Neg Hx    Heart  attack Neg Hx     Social History Social History   Tobacco Use   Smoking status: Never   Smokeless tobacco: Never  Vaping Use   Vaping status: Never Used  Substance Use Topics   Alcohol  use: Not Currently   Drug use: Never     Allergies   Fludrocortisone    Review of Systems Review of Systems Per HPI  Physical Exam Triage Vital Signs ED Triage Vitals  Encounter Vitals Group     BP 11/22/23 0902 116/70     Systolic BP Percentile --      Diastolic BP Percentile --      Pulse --      Resp 11/22/23 0902 17  Temp 11/22/23 0902 97.8 F (36.6 C)     Temp Source 11/22/23 0902 Oral     SpO2 11/22/23 0902 97 %     Weight --      Height --      Head Circumference --      Peak Flow --      Pain Score 11/22/23 0900 9     Pain Loc --      Pain Education --      Exclude from Growth Chart --    No data found.  Updated Vital Signs BP 116/70 (BP Location: Right Arm)   Temp 97.8 F (36.6 C) (Oral)   Resp 17   SpO2 97%   Visual Acuity Right Eye Distance:   Left Eye Distance:   Bilateral Distance:    Right Eye Near:   Left Eye Near:    Bilateral Near:     Physical Exam Vitals and nursing note reviewed.  Constitutional:      Appearance: He is not ill-appearing or toxic-appearing.  HENT:     Head: Normocephalic and atraumatic.     Right Ear: Hearing and external ear normal.     Left Ear: Hearing and external ear normal.     Nose: Nose normal.     Mouth/Throat:     Lips: Pink.  Eyes:     General: Lids are normal. Vision grossly intact. Gaze aligned appropriately.     Extraocular Movements: Extraocular movements intact.     Conjunctiva/sclera: Conjunctivae normal.  Cardiovascular:     Rate and Rhythm: Normal rate and regular rhythm.     Heart sounds: Normal heart sounds, S1 normal and S2 normal.  Pulmonary:     Effort: Pulmonary effort is normal. No respiratory distress.     Breath sounds: Normal breath sounds and air entry. No wheezing, rhonchi or rales.   Chest:     Chest wall: Tenderness present. No mass, lacerations, deformity, swelling, crepitus or edema.    Musculoskeletal:     Cervical back: Neck supple.  Skin:    General: Skin is warm and dry.     Capillary Refill: Capillary refill takes less than 2 seconds.     Findings: No rash.  Neurological:     General: No focal deficit present.     Mental Status: He is alert and oriented to person, place, and time. Mental status is at baseline.     GCS: GCS eye subscore is 4. GCS verbal subscore is 5. GCS motor subscore is 6.     Cranial Nerves: Cranial nerves 2-12 are intact. No dysarthria or facial asymmetry.     Sensory: Sensation is intact.     Motor: Motor function is intact. No weakness, tremor, abnormal muscle tone or pronator drift.     Coordination: Coordination is intact. Romberg sign negative. Coordination normal. Finger-Nose-Finger Test normal.     Gait: Gait is intact.     Comments: Strength and sensation intact to bilateral upper and lower extremities (5/5). Moves all 4 extremities with normal coordination voluntarily. Non-focal neuro exam.   Psychiatric:        Mood and Affect: Mood normal.        Speech: Speech normal.        Behavior: Behavior normal.        Thought Content: Thought content normal.        Judgment: Judgment normal.      UC Treatments / Results  Labs (all labs ordered are listed, but  only abnormal results are displayed) Labs Reviewed - No data to display  EKG   Radiology DG Ribs Unilateral W/Chest Left Result Date: 11/22/2023 CLINICAL DATA:  Fall 3 days ago.  Left rib cage pain. EXAM: LEFT RIBS AND CHEST - 3+ VIEW COMPARISON:  02/25/2023. FINDINGS: Heart size and mediastinal contours are normal. No pleural fluid, interstitial edema or airspace disease. No pneumothorax identified. There are acute rib fractures involving the anterior aspect of the fifth and sixth ribs. IMPRESSION: 1. Acute left anterior fifth and sixth rib fractures. 2. No  pneumothorax. Electronically Signed   By: Kimberley Penman M.D.   On: 11/22/2023 09:34    Procedures Procedures (including critical care time)  Medications Ordered in UC Medications - No data to display  Initial Impression / Assessment and Plan / UC Course  I have reviewed the triage vital signs and the nursing notes.  Pertinent labs & imaging results that were available during my care of the patient were reviewed by me and considered in my medical decision making (see chart for details).   1.  Rib cage pain on left side, closed fracture of multiple ribs of left side, fall at home Chest x-ray with left rib series shows acute fractures of the anterior 5th and 6th ribs.  No signs of pneumothorax.No clinical indication for head or neck CT today as he is nontender to the cranium or neck and does not meet [place] head CT scoring criteria.  Lung sounds clear on exam. Pain management and pneumonia prevention will be goals of treatment. PDMP reviewed, patient may have tramadol  50 mg every 8 hours as needed for severe pain that is not well-controlled by use of Tylenol  1000 mg every 6 hours. Drowsiness precautions discussed with tramadol  use. He has had hydrocodone -acetaminophen  in the past and wife states that this caused increasing confusion in the setting of dementia, therefore we will try use of tramadol  this time.  Incentive spirometer provided, he is able to demonstrate appropriate use of this in clinic, advised to use this 5-10 times every 1-2 hours while awake for the next 5-7 days to promote lung expansion and prevent pneumonia.  Rib cage belt applied in clinic which resulted in immediate improvement in pain per patient. He may use rib belt during the daytime and take this off at nighttime.  Recommend follow-up with PCP in the next 5 to 7 days for recheck, sooner if he develops fever, chills, cough, or any changes in neurologic state from baseline.  Counseled patient on potential for  adverse effects with medications prescribed/recommended today, strict ER and return-to-clinic precautions discussed, patient verbalized understanding.    Final Clinical Impressions(s) / UC Diagnoses   Final diagnoses:  Rib pain on left side  Closed fracture of multiple ribs of left side, initial encounter  Fall in home, initial encounter     Discharge Instructions      You broke your 5th and 6th ribs after fall. Use incentive spirometer 5-10 breaths every hour while awake for the next 5 to 7 days to prevent pneumonia.  This will help to fully expand the lungs. Splint your abdomen with a pillow when coughing, sneezing, or laughing. Use the rib belt during the daytime to provide compression to the rib cage, take this off at nighttime.  You may take Tylenol  1000 mg every 6 hours as needed for pain.  You may take tramadol  1 pill every 8 hours as needed for severe pain, mostly take this medication at nighttime. Tramadol  is  a narcotic medication and can cause drowsiness and increased confusion, therefore mostly give this to Patrick Macias at nighttime.  Please schedule a follow-up appointment with primary care provider in the next 5 to 7 days for recheck.  If you develop fever, chills, cough, congestion, etc., please return to urgent care or follow-up with primary care sooner for recheck to ensure that Patrick Macias has not developed pneumonia due to lack of deep breaths because of rib cage pain from fall.  Go to ER for severe symptoms.     ED Prescriptions     Medication Sig Dispense Auth. Provider   traMADol  (ULTRAM ) 50 MG tablet Take 1 tablet (50 mg total) by mouth every 8 (eight) hours as needed. 8 tablet Starlene Eaton, FNP      I have reviewed the PDMP during this encounter.   Shella Devoid [place], [place] 11/22/23 [phone]

## 2023-11-22 NOTE — ED Triage Notes (Addendum)
 Pt presents after he fell on Thursday into a metal table. C/o left rib pain   He has been taking 400mg  ibuprofen in the morning and at night. Tylenol  every 4 hours

## 2023-11-25 DIAGNOSIS — F03918 Unspecified dementia, unspecified severity, with other behavioral disturbance: Secondary | ICD-10-CM | POA: Diagnosis not present

## 2023-11-25 DIAGNOSIS — R2689 Other abnormalities of gait and mobility: Secondary | ICD-10-CM | POA: Diagnosis not present

## 2023-11-25 DIAGNOSIS — R2681 Unsteadiness on feet: Secondary | ICD-10-CM | POA: Diagnosis not present

## 2023-11-25 DIAGNOSIS — M6281 Muscle weakness (generalized): Secondary | ICD-10-CM | POA: Diagnosis not present

## 2023-11-26 NOTE — Telephone Encounter (Signed)
 Left a message to call back.

## 2023-11-27 DIAGNOSIS — M6281 Muscle weakness (generalized): Secondary | ICD-10-CM | POA: Diagnosis not present

## 2023-11-27 DIAGNOSIS — R2681 Unsteadiness on feet: Secondary | ICD-10-CM | POA: Diagnosis not present

## 2023-11-27 DIAGNOSIS — F03918 Unspecified dementia, unspecified severity, with other behavioral disturbance: Secondary | ICD-10-CM | POA: Diagnosis not present

## 2023-11-27 DIAGNOSIS — R2689 Other abnormalities of gait and mobility: Secondary | ICD-10-CM | POA: Diagnosis not present

## 2023-11-30 DIAGNOSIS — M6281 Muscle weakness (generalized): Secondary | ICD-10-CM | POA: Diagnosis not present

## 2023-11-30 DIAGNOSIS — R2689 Other abnormalities of gait and mobility: Secondary | ICD-10-CM | POA: Diagnosis not present

## 2023-12-01 DIAGNOSIS — N401 Enlarged prostate with lower urinary tract symptoms: Secondary | ICD-10-CM | POA: Diagnosis not present

## 2023-12-01 DIAGNOSIS — R3911 Hesitancy of micturition: Secondary | ICD-10-CM | POA: Diagnosis not present

## 2023-12-01 DIAGNOSIS — N138 Other obstructive and reflux uropathy: Secondary | ICD-10-CM | POA: Diagnosis not present

## 2023-12-02 DIAGNOSIS — R2689 Other abnormalities of gait and mobility: Secondary | ICD-10-CM | POA: Diagnosis not present

## 2023-12-02 DIAGNOSIS — M6281 Muscle weakness (generalized): Secondary | ICD-10-CM | POA: Diagnosis not present

## 2023-12-04 DIAGNOSIS — R296 Repeated falls: Secondary | ICD-10-CM | POA: Diagnosis not present

## 2023-12-04 DIAGNOSIS — M6281 Muscle weakness (generalized): Secondary | ICD-10-CM | POA: Diagnosis not present

## 2023-12-04 DIAGNOSIS — D472 Monoclonal gammopathy: Secondary | ICD-10-CM | POA: Diagnosis not present

## 2023-12-04 DIAGNOSIS — R2689 Other abnormalities of gait and mobility: Secondary | ICD-10-CM | POA: Diagnosis not present

## 2023-12-04 DIAGNOSIS — I6381 Other cerebral infarction due to occlusion or stenosis of small artery: Secondary | ICD-10-CM | POA: Diagnosis not present

## 2023-12-04 DIAGNOSIS — F0390 Unspecified dementia without behavioral disturbance: Secondary | ICD-10-CM | POA: Diagnosis not present

## 2023-12-04 DIAGNOSIS — I493 Ventricular premature depolarization: Secondary | ICD-10-CM | POA: Diagnosis not present

## 2023-12-04 DIAGNOSIS — I10 Essential (primary) hypertension: Secondary | ICD-10-CM | POA: Diagnosis not present

## 2023-12-04 DIAGNOSIS — R001 Bradycardia, unspecified: Secondary | ICD-10-CM | POA: Diagnosis not present

## 2023-12-04 DIAGNOSIS — I951 Orthostatic hypotension: Secondary | ICD-10-CM | POA: Diagnosis not present

## 2023-12-07 ENCOUNTER — Telehealth: Payer: Self-pay | Admitting: Neurology

## 2023-12-07 DIAGNOSIS — I679 Cerebrovascular disease, unspecified: Secondary | ICD-10-CM

## 2023-12-07 DIAGNOSIS — M6281 Muscle weakness (generalized): Secondary | ICD-10-CM | POA: Diagnosis not present

## 2023-12-07 DIAGNOSIS — R2689 Other abnormalities of gait and mobility: Secondary | ICD-10-CM | POA: Diagnosis not present

## 2023-12-07 NOTE — Telephone Encounter (Signed)
 Pt wife called stating that pt blood pressure   was increasing and the lower number is staying between 20-25 points. Pt blood Pressure is not doing well  and wife would like to talk to MD  on what she could do to help pt .

## 2023-12-07 NOTE — Telephone Encounter (Signed)
 Called and spoke to pt wife and she stated pt has had 12-13 episodes. Episodes include:tremors, legs weak, and just feels like he will fall but most of the time is able to stand through it. Cardiology seems to think its a neuro problem according to pt wife. Florineff was helping from cardiology but not alleviating it any more. Pt wife stated during episodes it would typically be 123/62 standing 99/54. They wanted help on how to proceed.

## 2023-12-07 NOTE — Telephone Encounter (Signed)
 Please call patient, check on his medication list, especially the dose of Florinef , suppose to take 0.1mg  1/2 tab daily, if that does not help, let him take one tab daily.  Also  increase water intake,  Compression stock,  If needed, abdominal binder.

## 2023-12-08 NOTE — Addendum Note (Signed)
 Addended by: Bert Ptacek on: 12/08/2023 03:54 PM   Modules accepted: Orders

## 2023-12-08 NOTE — Telephone Encounter (Signed)
 Clarified with Dr. Gracie Lav, she is agreeable to patient taking 1/2 tablet twice daily. Call to wife no answer, left message to call back. Will update medication list and prescription if wife agreeable

## 2023-12-08 NOTE — Telephone Encounter (Signed)
 Ok to try fludrocortisone = Florinef  0.1 mg half tablet  daily to begin with,

## 2023-12-08 NOTE — Telephone Encounter (Signed)
 Orders Placed This Encounter  Procedures   EEG adult   Please let patient's know I did order EEG to rule out possibility of seizure, but syncope/hypoperfusion of the brain sometimes can mimic seizure-like activity, advised patient observing his symptoms carefully, if his fainting confusion spell is mainly related to the positional change, then it is most likely caused by hypoperfusion of the brain

## 2023-12-08 NOTE — Telephone Encounter (Signed)
 Call to patient  and spoke to wife, advised Dr. Gracie Lav recommendations and EEG ordered. Medication list updated nad new script placed

## 2023-12-08 NOTE — Addendum Note (Signed)
 Addended by: Genora Kidd on: 12/08/2023 04:21 PM   Modules accepted: Orders

## 2023-12-08 NOTE — Addendum Note (Signed)
 Addended by: Genora Kidd on: 12/08/2023 09:17 AM   Modules accepted: Orders

## 2023-12-08 NOTE — Telephone Encounter (Signed)
 Called and relayed msg of florinef  0.5mg  bid was ok.   Wife stated that PT witnessed episode and thinks it might be seizure related. Was wondering if an eeg can be completed.

## 2023-12-08 NOTE — Telephone Encounter (Signed)
 Call to wife, she states they just got compressions sock and would like to try abdominal binder. She is giving him 1/2 tablet (0.05mg ) in the morning and is afraid to give a whole tablet at once. She is willing to try 1/2 tablet twice daily if Dr. Gracie Lav is in agreement. Wife states he is drinking plenty of fluids Medication list update to reflect current meds and dosing. Will send to Dr. Gracie Lav to review.

## 2023-12-09 NOTE — Telephone Encounter (Signed)
 Called advised of MD response:  Thanks Pharm D and triage teams.  If they have concerns about Florinef  being the cause of the symptoms, rather that symptomatic orthostatic hypotension, I will not make additional changes out of respect their decision.  Non-pharmacological interventions including compression stockings, abdominal binders may improve symptomatic orthostatic hypotension.  Ultimately, it sounds like neurology has had the most success in improving his non-cardiac syncope.  Dr. Arlester Ladd improved his PVC burden and no severe carotid disease was found.   Neurology may be able to provide some high level guidance.  Gloriann Larger, MD FASE [facility]   Spouse reports neurology has ordered fludrocortisone  0.1 mg half pill in AM and half pill in PM.  Has taken for 2 days has not noticed a big difference at this time. Med added to med list. Per spouse Neurology has ordered pt to have an EEG.  Pt wears compression socks spouse is in the process of obtaining an abdominal binder.  Reminded of next OV 12/24/23 at 10:20 am. All questions answered.

## 2023-12-21 ENCOUNTER — Ambulatory Visit: Admitting: Neurology

## 2023-12-21 DIAGNOSIS — R4182 Altered mental status, unspecified: Secondary | ICD-10-CM

## 2023-12-21 DIAGNOSIS — I679 Cerebrovascular disease, unspecified: Secondary | ICD-10-CM

## 2023-12-24 ENCOUNTER — Ambulatory Visit: Attending: Internal Medicine | Admitting: Internal Medicine

## 2023-12-24 VITALS — BP 130/69 | HR 62 | Ht 71.0 in | Wt 178.0 lb

## 2023-12-24 DIAGNOSIS — F03918 Unspecified dementia, unspecified severity, with other behavioral disturbance: Secondary | ICD-10-CM | POA: Diagnosis not present

## 2023-12-24 DIAGNOSIS — I493 Ventricular premature depolarization: Secondary | ICD-10-CM | POA: Diagnosis not present

## 2023-12-24 DIAGNOSIS — I4729 Other ventricular tachycardia: Secondary | ICD-10-CM | POA: Diagnosis not present

## 2023-12-24 DIAGNOSIS — D472 Monoclonal gammopathy: Secondary | ICD-10-CM | POA: Diagnosis not present

## 2023-12-24 DIAGNOSIS — I951 Orthostatic hypotension: Secondary | ICD-10-CM

## 2023-12-24 MED ORDER — MIDODRINE HCL 5 MG PO TABS: 5.0000 mg | ORAL_TABLET | Freq: Three times a day (TID) | ORAL | 3 refills | Status: DC

## 2023-12-24 NOTE — Patient Instructions (Signed)
 Medication Instructions:  Your physician has recommended you make the following change in your medication:  START: midodrine  (Promatine) 5 mg by mouth three times daily  *If you need a refill on your cardiac medications before your next appointment, please call your pharmacy*  Lab Work: NONE  If you have labs (blood work) drawn today and your tests are completely normal, you will receive your results only by: MyChart Message (if you have MyChart) OR A paper copy in the mail If you have any lab test that is abnormal or we need to change your treatment, we will call you to review the results.  Testing/Procedures: NONE  Follow-Up: At [facility], you and your health needs are our priority.  As part of our continuing mission to provide you with exceptional heart care, our providers are all part of one team.  This team includes your primary Cardiologist (physician) and Advanced Practice Providers or APPs (Physician Assistants and Nurse Practitioners) who all work together to provide you with the care you need, when you need it.  Your next appointment:   6 month(s)  Provider:   [name] Dunn, PA-C

## 2023-12-24 NOTE — Progress Notes (Signed)
 Cardiology Office Note:  .    Date:  12/24/2023  ID:  Patrick Macias, DOB 08/01/1940, MRN [id number] PCP: Shayne Anes, MD  [facility] Providers Cardiologist:  Victory LELON Claudene DOUGLAS, MD (Inactive) Electrophysiologist:  Eulas FORBES Furbish, MD     CC: CAD f/u  History of Present Illness: .    Patrick Macias is a 83 y.o. male with a history of CAD, Mild CAS, Significant memory loss, symptomatic orthostatic hypotension.  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 Furbish)   Patrick Macias is an 83 year old male with coronary artery disease who presents for follow-up on orthostatic hypotension and possible seizure disorder.  He has a history of coronary artery disease, carotid artery stenosis, significant memory loss, and symptomatic orthostatic hypotension managed by neurology. Despite taking fludrocortisone  0.1 mg, half a pill in the morning and half a pill in the evening, there has been no significant improvement in his symptoms. He experiences episodes of shaking and falls, particularly when standing, which have been increasing in frequency, with four significant episodes recently, including two on the morning of the visit.  He is currently wearing compression socks and has attempted to use an abdominal binder. An EEG was recently performed, but the results are not yet available. There is concern for a seizure disorder, as he has experienced uncontrollable shaking and falls, which have been described as possibly seizure-like by a physical therapist. He has had significant falls in the past, resulting in rib fractures.  Regarding his memory, it is not improving significantly. He is still able to function and perform daily activities, but there is a combination of long-term and short-term memory issues. He is frustrated by his limitations, particularly in activities he used to enjoy, such as walking his dog.  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 His current medications include aspirin  and Crestor  for coronary artery disease, with his LDL well-controlled.  Relevant histories: .  Social: HS patient, now me an [name] 2024: Established with me 2025: EP eval for PVCs, neurology has decreased Florinef  dosing ROS: As per HPI.   Studies Reviewed: .   Cardiac Studies & Procedures   ______________________________________________________________________________________________   STRESS TESTS  MYOCARDIAL PERFUSION IMAGING 07/17/2022  Narrative   Lexiscan  stress showed no EKG changes to suggest ischemia   Myoview  scan showed normal perfusion   LVEF on gating calculated at 54% with normal wall motion  Visually, LVEF appears greater   Prior study available for comparison from 05/17/2019. no change   Overall low risk study   ECHOCARDIOGRAM  ECHOCARDIOGRAM COMPLETE 02/24/2023  Narrative ECHOCARDIOGRAM REPORT    Patient Name:   Patrick Macias Date of Exam: 02/24/2023 Medical Rec #:  [id number]       Height:       71.0 in Accession #:    [id number]      Weight:       176.0 lb Date of Birth:  1941-04-27       BSA:          1.997 m Patient Age:    81 years        BP:           126/60 mmHg Patient Gender: M               HR:  60 bpm. Exam Location:  [address]  Procedure: 2D Echo, Cardiac Doppler and Color Doppler  Indications:    R00.8 Other Abnormalities of Heart (PVC's, NSVT)  History:        Patient has prior history of Echocardiogram examinations, most recent 04/01/2022. TIA, Arrythmias:PVC and NSVT, Signs/Symptoms:Hypotension and Murmur; Risk Factors:Hypertension and Dyslipidemia. Coronary Calcifications, Aortic Insufficiency.  Sonographer:    Heather Hawks RDCS Referring Phys: [name] N DUNN  IMPRESSIONS   1. Left ventricular ejection fraction, by estimation, is 50 to 55%.  The left ventricle has low normal function. The left ventricle has no regional wall motion abnormalities. Left ventricular diastolic parameters are indeterminate. 2. Right ventricular systolic function is normal. The right ventricular size is mildly enlarged. There is normal pulmonary artery systolic pressure. 3. Left atrial size was moderately dilated. 4. The mitral valve is normal in structure. Trivial mitral valve regurgitation. No evidence of mitral stenosis. 5. The aortic valve is tricuspid. There is mild calcification of the aortic valve. Aortic valve regurgitation is mild to moderate. No aortic stenosis is present. Aortic regurgitation PHT measures 547 msec. Aortic valve mean gradient measures 4.0 mmHg. 6. The inferior vena cava is normal in size with <50% respiratory variability, suggesting right atrial pressure of 8 mmHg.  Comparison(s): No significant change from prior study.  FINDINGS Left Ventricle: Left ventricular ejection fraction, by estimation, is 50 to 55%. The left ventricle has low normal function. The left ventricle has no regional wall motion abnormalities. The left ventricular internal cavity size was normal in size. There is no left ventricular hypertrophy. Left ventricular diastolic parameters are indeterminate.  Right Ventricle: The right ventricular size is mildly enlarged. Right vetricular wall thickness was not well visualized. Right ventricular systolic function is normal. There is normal pulmonary artery systolic pressure. The tricuspid regurgitant velocity is 2.57 m/s, and with an assumed right atrial pressure of 8 mmHg, the estimated right ventricular systolic pressure is 34.4 mmHg.  Left Atrium: Left atrial size was moderately dilated.  Right Atrium: Right atrial size was normal in size.  Pericardium: There is no evidence of pericardial effusion.  Mitral Valve: The mitral valve is normal in structure. Trivial mitral valve regurgitation. No evidence of mitral  valve stenosis.  Tricuspid Valve: The tricuspid valve is normal in structure. Tricuspid valve regurgitation is trivial. No evidence of tricuspid stenosis.  Aortic Valve: The aortic valve is tricuspid. There is mild calcification of the aortic valve. Aortic valve regurgitation is mild to moderate. Aortic regurgitation PHT measures 547 msec. No aortic stenosis is present. Aortic valve mean gradient measures 4.0 mmHg. Aortic valve peak gradient measures 7.0 mmHg. Aortic valve area, by VTI measures 3.10 cm.  Pulmonic Valve: The pulmonic valve was grossly normal. Pulmonic valve regurgitation is mild. No evidence of pulmonic stenosis.  Aorta: The aortic root, ascending aorta and aortic arch are all structurally normal, with no evidence of dilitation or obstruction.  Venous: The inferior vena cava is normal in size with less than 50% respiratory variability, suggesting right atrial pressure of 8 mmHg.  IAS/Shunts: The atrial septum is grossly normal.   LEFT VENTRICLE PLAX 2D LVIDd:         5.30 cm   Diastology LVIDs:         2.90 cm   LV e' medial:    5.22 cm/s LV PW:         0.80 cm   LV E/e' medial:  13.6 LV IVS:        1.00  cm   LV e' lateral:   6.71 cm/s LVOT diam:     2.40 cm   LV E/e' lateral: 10.5 LV SV:         90 LV SV Index:   45 LVOT Area:     4.52 cm   RIGHT VENTRICLE RV Basal diam:  4.20 cm RV Mid diam:    2.90 cm RV S prime:     13.40 cm/s TAPSE (M-mode): 2.2 cm RVSP:           29.4 mmHg  LEFT ATRIUM           Index        RIGHT ATRIUM           Index LA diam:      4.50 cm 2.25 cm/m   RA Pressure: 3.00 mmHg LA Vol (A2C): 41.3 ml 20.68 ml/m  RA Area:     14.80 cm LA Vol (A4C): 75.7 ml 37.91 ml/m  RA Volume:   36.80 ml  18.43 ml/m AORTIC VALVE AV Area (Vmax):    2.88 cm AV Area (Vmean):   2.73 cm AV Area (VTI):     3.10 cm AV Vmax:           132.50 cm/s AV Vmean:          90.550 cm/s AV VTI:            0.289 m AV Peak Grad:      7.0 mmHg AV Mean Grad:       4.0 mmHg LVOT Vmax:         84.30 cm/s LVOT Vmean:        54.600 cm/s LVOT VTI:          0.198 m LVOT/AV VTI ratio: 0.69 AI PHT:            547 msec  AORTA Ao Root diam: 3.60 cm Ao Asc diam:  3.60 cm  MITRAL VALVE               TRICUSPID VALVE MV Area (PHT): cm         TR Peak grad:   26.4 mmHg MV Decel Time: 262 msec    TR Vmax:        257.00 cm/s MV E velocity: 70.75 cm/s  Estimated RAP:  3.00 mmHg MV A velocity: 95.98 cm/s  RVSP:           29.4 mmHg MV E/A ratio:  0.74 SHUNTS Systemic VTI:  0.20 m Systemic Diam: 2.40 cm  Shelda Bruckner MD Electronically signed by Shelda Bruckner MD Signature Date/Time: 02/24/2023/4:46:11 PM    Final    MONITORS  CARDIAC EVENT MONITOR 02/11/2023  Narrative   Patient had a minimum heart rate of 46 bpm, maximum heart rate of 106 bpm, and average heart rate of 51 bpm.   Predominant underlying rhythm was sinus rhythm.   One six beat run of slow NSVT, one 4 beat run of NSVT   Isolated PACs were occasional (3.0%).   Isolated PVCs were frequent (11%).   No evidence of significant heart block .   No symptoms.  Frequent PVCs.   CT SCANS  CT CARDIAC SCORING (SELF PAY ONLY) 02/04/2019  Narrative CLINICAL DATA:  83 year old Caucasian male with history of hypertension.  EXAM: CT HEART FOR CALCIUM  SCORING  TECHNIQUE: CT heart was performed using prospective ECG gating.  A non-contrast exam for calcium  scoring was performed.  Note that this exam targets the heart  and the chest was not imaged in its entirety.  COMPARISON:  None.  FINDINGS: Technical quality: Good.  CORONARY CALCIUM   Total Agatston Score: 846  MESA database percentile: 75th  OTHER FINDINGS:  Aortic atherosclerosis. Within the visualized portions of the thorax there are no suspicious appearing pulmonary nodules or masses, there is no acute consolidative airspace disease, no pleural effusions, no pneumothorax and no lymphadenopathy. Visualized  portions of the upper abdomen are unremarkable. There are no aggressive appearing lytic or blastic lesions noted in the visualized portions of the skeleton.  IMPRESSION: 1. Patient's total coronary artery calcium  score is 846 which is 75th percentile for patient's of matched age, gender and race/ethnicity. Please note that although the presence of coronary artery calcium  documents the presence of coronary artery disease, the severity of this disease and any potential stenosis cannot be assessed on this noncontrast CT examination. Assessment for potential risk factor modification, dietary therapy or pharmacologic therapy may be warranted, if clinically indicated. 2.  Aortic Atherosclerosis (ICD10-I70.0).   Electronically Signed By: Toribio Aye M.D. On: 02/04/2019 14:07   CARDIAC MRI  MR CARDIAC MORPHOLOGY W WO CONTRAST 04/01/2023  Narrative CLINICAL DATA:  Frequent PVC's  EXAM: CARDIAC MRI  TECHNIQUE: The patient was scanned on a 1.5 Tesla Siemens magnet. A dedicated cardiac coil was used. Functional imaging was done using Fiesta sequences. 2,3, and 4 chamber views were done to assess for RWMA's. Modified Simpson's rule using a short axis stack was used to calculate an ejection fraction on a dedicated work [employer]. The patient received 12 cc of Gadavist . After 10 minutes inversion recovery sequences were used to assess for infiltration and scar tissue. Velocity flow mapping performed in the ascending aorta and main pulmonary artery.  CONTRAST:  12 cc  of Gadavist   FINDINGS: 1. Normal left ventricular size, thickness and systolic function (LVEF = 52%). There are no regional wall motion abnormalities.  There is basal septal midwall late gadolinium enhancement in the left ventricular myocardium.  LVEDV: 133 ml  LVESV: 66 ml  SV: 69 ml  CO: 4 L/min  Myocardial mass: 103g  LV native T1 value 988 ms (normal <1000 ms)  LV ECV value  21.4% (normal <30%)  2. Mildly dilated right ventricular size, normal thickness and normal systolic function (RVEF = 51%). There are no regional wall motion abnormalities.  3.  Mildly dilated right atrial size normal left atrial size.  4. Normal size of the aortic root, ascending aorta and pulmonary artery.  5.  Mild AI, mild TR.  no significant valvular abnormalities.  6.  Normal pericardium.  No pericardial effusion.  IMPRESSION: 1.  Normal LV systolic function.  LVEF 52%.  2.  Linear basal septal mid wall LGE (likely physiologic finding).  3.  No evidence for infiltrative disease.  4.  Normal RV function.  5.  No significant valvular abnormalities.  6.  No findings to suggest etiology of PVC's   Electronically Signed By: Redell Cave M.D. On: 04/01/2023 15:47   ______________________________________________________________________________________________       Physical Exam:    VS:  BP 130/69 (BP Location: Left Arm)   Pulse 62   Ht 5' 11 (1.803 m)   Wt 178 lb (80.7 kg)   SpO2 97%   BMI 24.83 kg/m    Wt Readings from Last 3 Encounters:  12/24/23 178 lb (80.7 kg)  09/16/23 175 lb 12.8 oz (79.7 kg)  08/13/23 181 lb (82.1 kg)    Gen: no  distress   Neck: No JVD Cardiac: No Rubs or Gallops, largely systolic murmur, IRIR bradycardia +2radial pulses Respiratory: Clear to auscultation bilaterally, normal effort, normal  respiratory rate GI: Soft, nontender, non-distended  MS: No  edema;  moves all extremities Integument: Skin feels warm Neuro:  At time of evaluation, alert and oriented to person/place/time/situation  Psych: Normal affect, patient feels well   ASSESSMENT AND PLAN: .    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 Despite lifestyle interventions such as compression socks and an abdominal binder, orthostatic vitals remain positive. Neurology is primarily managing this  condition. He has experienced significant episodes of hypotension leading to falls and rib fractures. There is a concern for potential seizure activity, and an EEG is pending. Fludrocortisone  has been ineffective, and midodrine  was historically ineffective, but a retrial is considered due to limited options. - Add midodrine  5 mg TID to current regimen - Encourage use of an abdominal binder - Continue follow-up with neurology who is now managing his florinef  - Provide information on specialists in autonomic dysfunction in [place]   Seizure Disorder (Possible) There is a concern for a possible seizure disorder due to episodes of uncontrollable shaking. An EEG is pending to evaluate this possibility. - Await EEG results  Memory Loss He has significant memory loss, which may be contributing to autonomic dysfunction. There is a possibility of neurologic deterioration associated with dysautonomia. His memory issues are being managed by neurology.  Coronary Artery Disease - Coronary artery disease is currently asymptomatic. He is on aspirin  and Crestor , and his LDL levels are well-controlled, significantly below 55.  Premature Ventricular Contractions (PVCs) - He experiences occasional PVCs but is asymptomatic. Aggressive treatment is avoided to prevent exacerbation of orthostatic hypotension.  Goals of Care He and his family are concerned about the impact of his condition on his quality of life. They are exploring all possible options to manage his symptoms and maintain his independence. They are considering the use of a wheelchair for safety during episodes, although he is resistant to this option. - Discuss potential use of a wheelchair for safety during episodes - Encourage maintaining activities that enhance quality of life   Longitudinal care: The evaluation and management services provided today reflect the complexity inherent in caring for this patient, including the ongoing  longitudinal relationship and management of multiple chronic conditions and/or the need for care coordination. The visit required a comprehensive assessment and management plan tailored to the patient's unique needs Time was spent addressing not only the acute concerns but also the broader context of the patient's health, including preventive care, chronic disease management, and care coordination as appropriate.  Complex longitudinal is necessary for conditions including: discussion or long term goals of care with wife given memory issues, neurologic issues, and coronary disease.    Stanly Leavens, MD FASE [facility] Cardiologist [facility]  [address] [place], [address] [place], [place] [address] [phone]  12:54 PM

## 2023-12-25 NOTE — Telephone Encounter (Addendum)
 Pt completed EEG 12/21/23. Results pending

## 2023-12-25 NOTE — Telephone Encounter (Signed)
 Pt wife called to request EEG once  doctor have them .

## 2023-12-28 NOTE — Telephone Encounter (Signed)
 Pt Wife called in regards to  wanting to know MRI results . Informed PT wife that results are pending

## 2023-12-31 ENCOUNTER — Telehealth: Payer: Self-pay | Admitting: Neurology

## 2023-12-31 NOTE — Telephone Encounter (Signed)
 Pt wife is asking to be called with results to EEG..  Wife also asking if Dr Onita can let her know what she thinks about pt being prescribed Midodrine  HCL 5 MG tablet  1 tablet 3 times a day to increase pt's blood pressure.

## 2024-01-01 NOTE — Procedures (Signed)
   HISTORY: 83 year old male with history of dementia intermittent increased confusion  TECHNIQUE:  This is a routine 16 channel EEG recording with one channel devoted to a limited EKG recording.  It was performed during wakefulness, drowsiness and asleep.  Photic stimulation were performed as activating procedures.  There are minimum muscle and movement artifact noted.  Upon maximum arousal, posterior dominant waking rhythm consistent of mildly dysrhythmic theta range. Activities are symmetric over the bilateral posterior derivations and attenuated with eye opening.  There was intermittent short lasting higher amplitude frontal dominant slow-wave in the delta range.  Photic stimulation did not alter the tracing.  Hyperventilation was not performed due to age During EEG recording, patient developed drowsiness and no sleep was achieved During EEG recording, there was no epileptiform discharge noted.  EKG demonstrate normal sinus rhythm.  CONCLUSION: This is a mild abnormal study due to background slowing, indicating bihemispheric malfunction. Sol Englert, M.D. Ph.D.  [facility] [address] [place], [place] [address] Phone: [phone] Fax:      [phone]

## 2024-01-04 ENCOUNTER — Encounter: Payer: Self-pay | Admitting: Neurology

## 2024-01-04 ENCOUNTER — Telehealth: Payer: Self-pay | Admitting: Internal Medicine

## 2024-01-04 NOTE — Telephone Encounter (Signed)
 Called wife and informed her. Wife expressed concern that it is taking quite a long time.

## 2024-01-04 NOTE — Telephone Encounter (Signed)
 Pt c/o medication issue:  1. Name of Medication: midodrine  (PROAMATINE ) 5 MG tablet   2. How are you currently taking this medication (dosage and times per day)? As written   3. Are you having a reaction (difficulty breathing--STAT)? No   4. What is your medication issue? Pts wife isn't sure if the pt should continue taking this medication due to his BP not staying consistent    173/91 170/80 120/80 140/82 139/70

## 2024-01-04 NOTE — Telephone Encounter (Signed)
 Spoke with the patient's wife who states that she has been giving the patient midodrine  5 mg only twice per day. She states that it has helped with his orthostatic hypotension, however he has also had some elevated BP readings. These have been in the 170s/70s. Patient is unaware of when blood pressure is elevated. She states that yesterday BP was 139/70 sitting and 135/68 standing. She did not give him midodrine  last night or this morning. She states that this mroning blood pressure was 136/59 sitting and dropped about 20 points after standing. She reports his heart rate has still remained low in the 40s. She states that she is concerned to keep giving him the midodrine  with blood pressure rising, however she does feel that it was helpings some. She is wanting to know if he should continue taking it at a lower dose.

## 2024-01-05 ENCOUNTER — Telehealth: Payer: Self-pay

## 2024-01-05 NOTE — Telephone Encounter (Signed)
 Patient returned phone call, transferred call to POD2.

## 2024-01-05 NOTE — Telephone Encounter (Signed)
 See MyChart message

## 2024-01-05 NOTE — Telephone Encounter (Signed)
 I spoke with Dr.Camara regarding EEG results due to Dr. ONITA out of office. No official results have been sent to us  From Dr. ONITA, but Dr. Gregg review results and advised that no seizure activity was noted and that although there abnormal slowing,compared to the last EEG seems to be similar and normal for patient.  I relayed this information to daughter when she returned call and advised these were unofficial results and would have Dr. ONITA advise when she returns. Daughter verbalized understanding and appreciative of call.

## 2024-01-05 NOTE — Telephone Encounter (Signed)
 Call to shannon, pt's. Daughter. No answer. Left message to call back

## 2024-01-06 NOTE — Telephone Encounter (Signed)
 Wife Idell) returned RN's call.

## 2024-01-06 NOTE — Telephone Encounter (Signed)
 Spoke with pt spouse ok per DPR.  Advised of MD response: Can do trial down to 2.5 mg of midodrine .   Ultimately, neurology has been leading the care for his non-cardiac disease.   I see that there are some message from neurology, I suspect they will also reach out shortly.  Spouse reports Dr. Onita Neurologist is not in the office this week.  Spouse reports will give pt 2.5 mg Midodrine  for 2 nights then increase to 2.5 mg PO BID.  Will f/u with Dr. Onita next week.  Spouse would like this encounter sent to Dr. Georgianne office-completed.

## 2024-01-06 NOTE — Telephone Encounter (Signed)
 Left a message to call back.

## 2024-01-06 NOTE — Telephone Encounter (Signed)
 Agree with recommendation as outlined below. HR reported as 39 below but would check to see how this is being obtained - pulse ox is not useful in him given frequent PVCs as they do not pick up on this type of monitoring. If low HR persists, can bring back in for EKG visit only. Thank you!

## 2024-01-07 MED ORDER — MIDODRINE HCL 5 MG PO TABS: 2.5000 mg | ORAL_TABLET | Freq: Three times a day (TID) | ORAL | 3 refills | Status: DC

## 2024-01-07 NOTE — Addendum Note (Signed)
 Addended by: RANDY HAMP SAILOR on: 01/07/2024 03:27 PM   Modules accepted: Orders

## 2024-01-22 ENCOUNTER — Encounter: Payer: Self-pay | Admitting: Neurology

## 2024-01-22 DIAGNOSIS — I679 Cerebrovascular disease, unspecified: Secondary | ICD-10-CM

## 2024-01-22 DIAGNOSIS — F03918 Unspecified dementia, unspecified severity, with other behavioral disturbance: Secondary | ICD-10-CM

## 2024-01-28 NOTE — Telephone Encounter (Signed)
Orders Placed This Encounter  Procedures  . Ambulatory referral to Neurology   

## 2024-01-28 NOTE — Addendum Note (Signed)
 Addended by: Moosa Bueche on: 01/28/2024 04:54 PM   Modules accepted: Orders

## 2024-01-28 NOTE — Addendum Note (Signed)
 Addended by: JOSHUA IZETTA CROME on: 01/28/2024 10:54 AM   Modules accepted: Orders

## 2024-02-01 ENCOUNTER — Telehealth: Payer: Self-pay | Admitting: Neurology

## 2024-02-01 NOTE — Telephone Encounter (Signed)
 Referral for neurology fax to [facility]. Phone: [phone], Fax: [phone]

## 2024-02-02 NOTE — Progress Notes (Signed)
 This encounter was created in error - please disregard.

## 2024-02-04 ENCOUNTER — Telehealth: Payer: Self-pay | Admitting: Internal Medicine

## 2024-02-04 NOTE — Telephone Encounter (Signed)
 Called spouse advised of MD previous recommendation from 01/06/24.  Spouse reports has not reached out to Neurology I have advised her to do so.   Pt wears compression socks but has not worn an abdominal binder per spouse.  Advised spouse to have pt wear abdominal binder as recommended by Dr. Santo.   Will have a provider to review.

## 2024-02-04 NOTE — Telephone Encounter (Signed)
 Patient identification verified by 2 forms.   Called and spoke to patient's wife  Patient's states:  -Pt fell across cabinet and wife was unable to get him upright. HE was moan and groaning but not able to get him up.  -With Fludrocortisone   pt has shaking, gets weak, and has fallen at least 8 times since being on the medication.  -Yesterday she did not give him any of the medication,.  Today she has given him 1/2 and he has not had any episodes.   Patient denies:  -Wanting to continue the medications if he continues to have episodes.              Interventions/Plan: -Will get message to [name] and Dr. Santo to get advise.   Patient's wife agrees with plan, no questions at this time

## 2024-02-04 NOTE — Telephone Encounter (Signed)
 Pt c/o medication issue:  1. Name of Medication: memantine  (NAMENDA ) 10 MG tablet   FLUDROCORTISONE  ACETATE PO   2. How are you currently taking this medication (dosage and times per day)?   3. Are you having a reaction (difficulty breathing--STAT)? yes  4. What is your medication issue? Memantine  -just paralyzed him per wife  so wife took him off of it. Fludrocortisone  not working per wife

## 2024-02-05 NOTE — Telephone Encounter (Signed)
 Called pt advised of Providers recommendations.  Spouse reports pt does not take midodrine  previously given X2 doses.  Pt had a bad episode where fell backward and became lethargic.  Reports adding midodrine  was the only recent change at the time.  Pt has had no more doses of Midodrine  and hasn't had anymore episodes.    Advised pt to follow up with Neurology.  Reports called Neurology about 2 days ago and no response.  I have sent a secure chat to Neurology NP Sarah asking if office can reach out to pt.

## 2024-02-05 NOTE — Telephone Encounter (Signed)
 Reviewed for [name] while she is out of the office. Florinef  is managed by neuro. Agree with reaching to to neuro for management. Would continue current dose of Midodrine  and continue to monitor. Glendia Ferrier, PA-C    02/05/2024 12:15 PM

## 2024-02-25 ENCOUNTER — Encounter: Payer: Self-pay | Admitting: Neurology

## 2024-02-25 ENCOUNTER — Ambulatory Visit: Payer: Medicare HMO | Admitting: Neurology

## 2024-02-25 VITALS — BP 136/76 | HR 66 | Ht 71.0 in | Wt 177.0 lb

## 2024-02-25 DIAGNOSIS — R269 Unspecified abnormalities of gait and mobility: Secondary | ICD-10-CM | POA: Diagnosis not present

## 2024-02-25 DIAGNOSIS — I679 Cerebrovascular disease, unspecified: Secondary | ICD-10-CM

## 2024-02-25 DIAGNOSIS — F0390 Unspecified dementia without behavioral disturbance: Secondary | ICD-10-CM | POA: Diagnosis not present

## 2024-02-25 NOTE — Progress Notes (Signed)
 Chart reviewed, agree above plan ?

## 2024-02-25 NOTE — Progress Notes (Signed)
 Assessment and Plan  1.  Dementia - Documented orthostatic changes today, family needs to verify dose of Florinef , likely on 0.1 mg BID, which would be max dose, higher doses have more side effect without benefit  -Midodrine  resulted in more severe spells  - Advised to wear stockings, increase water intake, stand slowly - Concern for Lewy body dementia - DAT scan showed no parkinsonian pathology, differential diagnosis includes Alzheimer's dementia with cerebral vascular component - Check ATN profile, APOE today, results would help wife with long term care plan - Pending referral to [facility] for second opinion - Continue Aricept  and Namenda  - Order for rolling walker with a seat, to keep with him in event of orthostasis - EEG showed slowing   2.  Cerebrovascular disease - MRI of the brain in 2022 showed advanced small vessel disease, - Vascular risk factor of hypertension, hyperlipidemia, on aspirin  daily - Emphasized importance of moderate exercise, increase water intake  Follow-up with me or Dr. Onita in 6 months  DIAGNOSTIC DATA (LABS, IMAGING, TESTING) - I reviewed patient records, labs, notes, testing and imaging myself where available.  HISTORY Patrick Macias is a 83 year old male, accompanied by his daughter Patrick Macias, seen in request by his primary care physician Dr. Shayne Macias for evaluation of mental status change, memory loss, generalized weakness, initial evaluation was on 08/01/2020,   I reviewed and summarized the referring note.  Past medical history Hyperlipidemia Prostate hypertrophy Anxiety   He has been working for over 40 years, had his own insurance business, his daughter-in-law works with him, he was noted to have gradual onset of memory loss over the past couple years, become obvious since September 2021, daughter recorded Mini-Mental Status Examination by his primary care physician was 24/30, he was noted to have slow reaction time, need longer time to finish  conversation, often stop in the middle of the sentence.   This has become much more obvious since his hospital admission from January 1-13, 2022, before hospital admission, he developed hives on different part of his body, he has to take Zyrtec at home, with no help, was not able to sleep well due to itching rash, on the day he was admitted to the hospital on June 30, 2020, he was found to have low-grade fever, significant confusion, difficulty getting out of the bed, slurred speech, paramedic brought him to the emergency room,   Emergency room, fever, temperature 100.5, C-reactive protein 12.6,   CT angiogram of head and neck showed no significant large vessel disease   MRI of the brain on 07/02/2020, compared to previous MRI in December 2021, in September 2021, no acute abnormality, advanced supratentorium, periventricular, right paramedian pons T2 flair signal abnormality, most consistent with small vessel disease,   Laboratory evaluations: CMP, calcium  slightly decreased 8.8, glucose was mildly elevated 108, CBC showed hemoglobin of 11.1, ammonia 16, cyclic citrul peptide antibody were negative, hepatitis B surface antigen, HCV antibody, hepatitis a IgM, hepatitis B C IgM were all negative, pro calcitonin was normal, rheumatoid factor was elevated at 30.5, double-stranded DNA was negative, ANA was negative, CMV PCR, EBV DNA PCR, Lyme titer, varicella-zoster PCR, HIV, vitamin B1, vitamin D , RPR, ammonia, B12, folic acid , TSH, were all normal, A1c was 5.1, ESR was normal 8, negative UDS, the only abnormality was mildly decreased B12 level 221, is on B12 supplement.   CSF study: WBC 1, RBC 2, total protein of 41, glucose of 67, HSV 1/2 PCR negative   Extensive [facility] CSF  encephalopathy-autoimmune panels were negative   He also had a prior  - ---hospital admission on December 3-6, 2021, hospital admission for increased confusion, speech difficulty, unsteady on his feet, fidgety,   During  today's interview, patient reported he is back to his baseline, wife and daughter reported that he is 75% back to his baseline, was noted to have significant memory loss, Mini-Mental Status Examination is 5 out of 30, often stopping the middle of the sentence, change subject, slow reaction time,   UPDATE February 11 2021: He is accompanied by his wife at today's visit, his daughter is on the phone, he is closing his business, over the past few months, he was noted to have increased difficulty, difficulty finishing up his symptoms, difficulty following through the conversation, sleeps a lot, he used to be very active, he is no longer motivated, during the day, he watches TV most of the time, take long naps during the day, has good appetite, he can still dress, tolerating, bathing himself, mild unsteady gait  UPDATE Aug 13 2023: Patrick Macias for neuropsychology evaluation,  Revealed deficits in most domains assessed, including on measures of attention, information processing efficiency, language, visuospatial/constructional functioning, and executive functioning. Qualitatively, he has profound visuoconstructional impairment, which seems out of proportion to his difficulty in other areas. Despite numerous low scores, memory was an area of relative preservation with good retention of information across time. He screened negative for the presence of depression and his wife rated him as functioning at a mild dementia level of impairment on an informant report severity status staging scale.  Taken together, findings of the evaluation suggest a dementia level problem currently at a late mild to early moderate level of progression. 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  He and his wife recently moved to assisted  living, still unpacking, which has been very stressful, wife think the stress might contributed to his transient worsening including dizziness, nightmare, hallucination, which seems to have some improvement,   he sleeps well, no longer have frequent dizziness, had low back pain which has helped by pain management, still has mild unsteady gait,  He is taking Namenda  10 mg twice a day Aricept  10 mg daily, tolerating it well  Virtual Visit via video UPDATE September 14 2023: They feel settled in the independent living, he is overall doing well, independent in daily activity, showering, toileting, dressing, eating, without any difficulties  We reviewed [employer]  from March 2025, showed no parkinsonian syndrome pathology  MRI of the brain from 2022 showed generalized atrophy, moderate small vessel disease  Echocardiogram from 2024 showed no significant abnormality  Ultrasound of carotid artery from October 2024 showed no large vessel disease  Update February 25, 2024 SS: EEG showed mild background slowing. DAT scan was normal. Cardiology started midodrine  5 mg TID made his spells of weakness worse, was falling more. Now taking Florinef  0.1 mg BID, great last 4 days, but today he had a few spells while standing, got shaky in arms, then legs, had to sit down, was not wearing compression stockings, happened 4 times today, no more than a minute. MMSE 23/30. Not lately hallucinations, has bad nightmares occasionally. On Aricept  10 mg AM, namenda  10 mg BID. Wife looking into day program for memory at retirement community. Does ADLs independent, wife is close by. Referral to [facility]. + for orthostatic changes sitting 135/74  HR 62, Standing 105/58 HR 74  REVIEW OF SYSTEMS: Out of a complete 14 system review of symptoms, the patient complains only of the following symptoms, and all other reviewed systems are negative.  See HPI  Physical Exam    02/25/2024    2:46 PM 08/13/2023    1:40 PM 02/04/2023    8:47  AM  MMSE - Mini Mental State Exam  Orientation to time 3 3 3   Orientation to Place 5 5 5   Registration 3 3 2   Attention/ Calculation 1 4 2   Recall 3 3 2   Language- name 2 objects 2 2 2   Language- repeat 1 1 1   Language- follow 3 step command 3 3 3   Language- read & follow direction 1 1 1   Write a sentence 1 1 0  Copy design 0 0 0  Total score 23 26 21    General: The patient is alert and cooperative at the time of the examination.   Skin: No significant peripheral edema is noted.  Neurologic Exam  Mental status: The patient is alert and oriented x 3 at the time of the examination.  Provides most history, but wife elaborates.  A few times he lost his train of thought, or sentence wasn't logical.  Follows commands well. Laughs, makes jokes. Pleasant.   Cranial nerves: Facial symmetry is present. Speech is normal, no aphasia or dysarthria is noted. Extraocular movements are full. Visual fields are full.  Motor: The patient has good strength in all 4 extremities.  Sensory examination: Soft touch sensation is symmetric on the face, arms, and legs.  Coordination: The patient has good finger-nose-finger and heel-to-shin bilaterally.  Gait and station: The patient has cautious gait  Reflexes: Deep tendon reflexes are symmetric.  ALLERGIES: Allergies  Allergen Reactions   Fludrocortisone      Other Reaction(s): acute renal failure  Pt takes this but cut dose    HOME MEDICATIONS: Outpatient Medications Prior to Visit  Medication Sig Dispense Refill   acetaminophen  (TYLENOL ) 500 MG tablet Take 1,000 mg by mouth every 6 (six) hours as needed for headache (pain).     aspirin  EC 81 MG tablet Take 1 tablet (81 mg total) by mouth daily. 90 tablet 3   buPROPion (WELLBUTRIN XL) 150 MG 24 hr tablet Take 150 mg by mouth every morning.     carboxymethylcellulose (REFRESH TEARS) 0.5 % SOLN Place 1 drop into both eyes 3 (three) times daily as needed (dry eyes).     cholecalciferol (VITAMIN  D3) 25 MCG (1000 UNIT) tablet Take 1,000 Units by mouth See admin instructions. Take 1000 mg 5 days a week, Sun through [employer]     Cyanocobalamin  (B-12) 3000 MCG CAPS Take 3,000 mcg by mouth daily.     donepezil  (ARICEPT ) 10 MG tablet Take 10 mg by mouth in the morning. Wife gives in morning due to nightmares     escitalopram  (LEXAPRO ) 20 MG tablet Take 20 mg by mouth every morning.     FLUDROCORTISONE  ACETATE PO Take 0.05 mg by mouth 2 (two) times daily. Takes 1/2 of 0.1 mg pill in AM 1/2 pill in PM     ibuprofen (ADVIL) 400 MG tablet Take by mouth daily.     melatonin 5 MG TABS Take 5 mg by mouth at bedtime.     memantine  (NAMENDA ) 10 MG tablet Take 1 tablet (10 mg total) by mouth 2 (two) times daily. 180 tablet 4   Multiple Vitamin (MULTIVITAMIN) tablet Take 1 tablet by mouth daily.  rosuvastatin  (CRESTOR ) 20 MG tablet Take 20 mg by mouth daily. Takes at bedtime per wife     tamsulosin  (FLOMAX ) 0.4 MG CAPS capsule Take 0.4 mg by mouth 2 (two) times daily.     zoledronic  acid (RECLAST ) 5 MG/100ML SOLN injection Inject 5 mg into the vein once.     midodrine  (PROAMATINE ) 5 MG tablet Take 0.5 tablets (2.5 mg total) by mouth 3 (three) times daily with meals. (Patient not taking: Reported on 02/25/2024) 135 tablet 3   No facility-administered medications prior to visit.    PAST MEDICAL HISTORY: Past Medical History:  Diagnosis Date   Anxiety    Aortic atherosclerosis (HCC)    BPH (benign prostatic hyperplasia)    Carotid atherosclerosis    Cerumen impaction    Coronary artery calcification    Dyslipidemia    Frequent PVCs    H/O lithotripsy    Hearing decreased, bilateral    Hx of colonic polyps    IFG (impaired fasting glucose)    Macrocytic anemia    Macrocytosis    Memory loss    MGUS (monoclonal gammopathy of unknown significance)    Nephrolithiasis    TIA (transient ischemic attack)    Vitamin D  deficiency     PAST SURGICAL HISTORY: Past Surgical History:  Procedure  Laterality Date   BALLOON DILATION Bilateral 08/20/2022   Procedure: BALLOON DILATION;  Surgeon: Burnette Fallow, MD;  Location: [facility];  Service: Gastroenterology;  Laterality: Bilateral;   BIOPSY  08/20/2022   Procedure: BIOPSY;  Surgeon: Burnette Fallow, MD;  Location: [facility];  Service: Gastroenterology;;   BOTOX  INJECTION N/A 08/20/2022   Procedure: BOTOX  INJECTION;  Surgeon: Burnette Fallow, MD;  Location: [facility];  Service: Gastroenterology;  Laterality: N/A;  100 units   CATARACT EXTRACTION, BILATERAL     ESOPHAGEAL MANOMETRY N/A 04/16/2022   Procedure: ESOPHAGEAL MANOMETRY (EM);  Surgeon: Burnette Fallow, MD;  Location: [facility];  Service: Gastroenterology;  Laterality: N/A;   ESOPHAGOGASTRODUODENOSCOPY (EGD) WITH PROPOFOL  Bilateral 08/20/2022   Procedure: ESOPHAGOGASTRODUODENOSCOPY (EGD) WITH PROPOFOL ;  Surgeon: Burnette Fallow, MD;  Location: [facility];  Service: Gastroenterology;  Laterality: Bilateral;   PROSTATECTOMY     Partial    FAMILY HISTORY: Family History  Problem Relation Age of Onset   Leukemia Mother    Breast cancer Mother    Other Father        unknown medical history   Sudden death Neg Hx    Hypertension Neg Hx    Hyperlipidemia Neg Hx    Diabetes Neg Hx    Heart attack Neg Hx     SOCIAL HISTORY: Social History   Socioeconomic History   Marital status: Married    Spouse name: Inocente   Number of children: 2   Years of education: college   Highest education level: Bachelor's degree (e.g., BA, AB, BS)  Occupational History   Occupation: Insurance   Tobacco Use   Smoking status: Never   Smokeless tobacco: Never  Vaping Use   Vaping status: Never Used  Substance and Sexual Activity   Alcohol  use: Not Currently   Drug use: Never   Sexual activity: Not Currently  Other Topics Concern   Not on file  Social History Narrative   Lives at home with his wife.   Left-handed.   Two cups caffeine per day.   Social Drivers of Manufacturing engineer Strain: Not on file  Food Insecurity: Not on file  Transportation Needs: Not on file  Physical Activity: Not on file  Stress: Not on file  Social Connections: Not on file  Intimate Partner Violence: Not on file   Lauraine Born, SCHARLENE, [facility]  [facility] [address], [address] [place], [place] [address] [phone]

## 2024-02-25 NOTE — Patient Instructions (Signed)
 Check labs today Verify dose of Florinef  Wear compression stockings, stay hydrated with water, stand slowly Prescription for rolling walker with a seat Continue Aricept  and Namenda  Follow-up in 6 months

## 2024-03-01 MED ORDER — PYRIDOSTIGMINE BROMIDE 60 MG PO TABS: 30.0000 mg | ORAL_TABLET | Freq: Two times a day (BID) | ORAL | 3 refills | Status: AC

## 2024-03-01 NOTE — Addendum Note (Signed)
 Addended by: GAYLAND LAURAINE PARAS on: 03/01/2024 04:19 PM   Modules accepted: Orders

## 2024-03-12 LAB — APOE ALZHEIMER'S RISK

## 2024-03-12 LAB — ATN PROFILE
A -- Beta-amyloid 42/40 Ratio: 0.106 (ref >0.102)
Beta-amyloid 40: 262.32 pg/mL
Beta-amyloid 42: 27.69 pg/mL
N -- NfL, Plasma: 5.19 pg/mL (ref 0.00–9.13)
T -- p-tau181: 1.78 pg/mL — ABNORMAL HIGH (ref 0.00–0.97)

## 2024-03-15 ENCOUNTER — Ambulatory Visit: Payer: Self-pay | Admitting: Neurology

## 2024-03-30 DIAGNOSIS — R351 Nocturia: Secondary | ICD-10-CM | POA: Diagnosis not present

## 2024-03-30 DIAGNOSIS — N200 Calculus of kidney: Secondary | ICD-10-CM | POA: Diagnosis not present

## 2024-04-06 ENCOUNTER — Encounter: Payer: Self-pay | Admitting: Cardiovascular Disease

## 2024-04-06 ENCOUNTER — Ambulatory Visit: Attending: Cardiology | Admitting: Cardiovascular Disease

## 2024-04-06 VITALS — BP 127/63 | HR 72 | Ht 71.0 in | Wt 175.0 lb

## 2024-04-06 DIAGNOSIS — I493 Ventricular premature depolarization: Secondary | ICD-10-CM

## 2024-04-06 NOTE — Patient Instructions (Signed)
 Medication Instructions:  Your physician recommends that you continue on your current medications as directed. Please refer to the Current Medication list given to you today.  *If you need a refill on your cardiac medications before your next appointment, please call your pharmacy*  Lab Work: None ordered.  If you have labs (blood work) drawn today and your tests are completely normal, you will receive your results only by: MyChart Message (if you have MyChart) OR A paper copy in the mail If you have any lab test that is abnormal or we need to change your treatment, we will call you to review the results.  Testing/Procedures: Your physician has requested that you have an echocardiogram. Echocardiography is a painless test that uses sound waves to create images of your heart. It provides your doctor with information about the size and shape of your heart and how well your heart's chambers and valves are working. This procedure takes approximately one hour. There are no restrictions for this procedure. Please do NOT wear cologne, perfume, aftershave, or lotions (deodorant is allowed). Please arrive 15 minutes prior to your appointment time.  Please note: We ask at that you not bring children with you during ultrasound (echo/ vascular) testing. Due to room size and safety concerns, children are not allowed in the ultrasound rooms during exams. Our front office staff cannot provide observation of children in our lobby area while testing is being conducted. An adult accompanying a patient to their appointment will only be allowed in the ultrasound room at the discretion of the ultrasound technician under special circumstances. We apologize for any inconvenience.   Follow-Up: At [facility], you and your health needs are our priority.  As part of our continuing mission to provide you with exceptional heart care, our providers are all part of one team.  This team includes your primary  Cardiologist (physician) and Advanced Practice Providers or APPs (Physician Assistants and Nurse Practitioners) who all work together to provide you with the care you need, when you need it.  Your next appointment:   12 months with Dr Mealor

## 2024-04-06 NOTE — Addendum Note (Signed)
 Addended by: CASIMIR ALDONA BRAVO on: 04/06/2024 10:29 AM   Modules accepted: Orders

## 2024-04-06 NOTE — Progress Notes (Signed)
 Electrophysiology Office Note:    Date:  04/06/2024   ID:  Patrick Macias, DOB [birth year], MRN [id number]  PCP:  Shayne Anes, MD   [facility] Providers Cardiologist:  Victory LELON Claudene DOUGLAS, MD (Inactive) Electrophysiologist:  Eulas FORBES Furbish, MD     Referring MD: Shayne Anes, MD   History of Present Illness:    Patrick Macias is a 83 y.o. male with a hx listed below, significant for vascular disease including elevated coronary calcium  score, MGUS, macrocytic anemia referred for arrhythmia management.  He has a history of frequent PVCs.  During a clinic visit in October 2023, his heart rate registered 30-40 bpm due to frequent PVCs resulting in electro-apical dissociation.  We switched from metoprolol  to propranolol .  His PVC burden improved from approximate 25% to 11%.  During this time, his symptoms actually worsened.  For this reason, I did not think his symptoms were attributable to PVCs.  Since his last visit, he has been seeing neurology and treating his orthostatic hypotension.  He started Mestinon , which has helped remarkably.  He does have occasional weakness and lightheadedness.  No chest pain, palpitations, syncope, near syncope.      EKGs/Labs/Other Studies Reviewed Today:     TTE: 2022/07/15 EF 50-55%  TTE: 02/24/2023 EF 50-55%. Moderately dilated LA   Monitor: 5 day zio patch 04/2022 Sinus rhythm 25.6% PVC burden, occasionally occurring in bigeminy. There were a few epsiodes of NSVT  Monitor 12/13/2022   Patient had a minimum heart rate of 46 bpm, maximum heart rate of 106 bpm, and average heart rate of 51 bpm.   Predominant underlying rhythm was sinus rhythm.   One six beat run of slow NSVT, one 4 beat run of NSVT   Isolated PACs were occasional (3.0%).   Isolated PVCs were frequent (11%).   No evidence of significant heart block .   No symptoms.  Lexiscan  stress test:    Lexiscan  stress showed no EKG changes to suggest ischemia   Myoview   scan showed normal perfusion   LVEF on gating calculated at 54% with normal wall motion  Visually, LVEF appears greater   Prior study available for comparison from 05/17/2019. no change  Cardiac MRI: Normal LV systolic function, LVEF 52% linear basal septal mid wall LGE, thought to be a physiologic finding.  No evidence of infiltrative disease.  No finding to suggest etiology of PVCs.   EKG:   EKG Interpretation Date/Time:  Wednesday April 06 2024 09:37:16 EDT Ventricular Rate:  72 PR Interval:  204 QRS Duration:  98 QT Interval:  466 QTC Calculation: 510 R Axis:   -22  Text Interpretation: Sinus rhythm with frequent Premature ventricular complexes Cannot rule out Anterior infarct , age undetermined Prolonged QT When compared with ECG of 24-Dec-2023 10:33, No significant change Confirmed by Furbish Eulas [phone]) on 04/06/2024 10:07:56 AM    Recent Labs: 09/16/2023: ALT 40; BUN 21; Creatinine 1.12; Hemoglobin 12.7; Platelet Count 191; Potassium 4.1; Sodium 141     Physical Exam:    VS:  There were no vitals taken for this visit.    Wt Readings from Last 3 Encounters:  02/25/24 177 lb (80.3 kg)  12/24/23 178 lb (80.7 kg)  09/16/23 175 lb 12.8 oz (79.7 kg)     GEN:  Well nourished, well developed in no acute distress CARDIAC: RRR, no murmurs, rubs, gallops RESPIRATORY:  Normal work of breathing MUSCULOSKELETAL: no edema    ASSESSMENT & PLAN:    Frequent  PVCs:  EF is normal.  Burden has decreased from 25% May 22, 2022 to 11% in June 2024 Symptoms of fatigue and shortness of breath have gotten worse despite improvement in PVC burden -- PVCs did not appear to be the cause Will repeat echocardiogram to assess LV function  Sinus bradycardia Possible chronotropic incompetence   Weakness, falls, lightheadedness Symptoms remarkably improved after starting pyridostigmine  Still with some component of orthostasis  NSVT: no intervention indicated         Medication Adjustments/Labs and Tests Ordered: Current medicines are reviewed at length with the patient today.  Concerns regarding medicines are outlined above.  No orders of the defined types were placed in this encounter.  No orders of the defined types were placed in this encounter.    Signed, Eulas FORBES Furbish, MD  04/06/2024 8:34 AM    [facility]

## 2024-05-02 ENCOUNTER — Other Ambulatory Visit: Payer: Self-pay | Admitting: Neurology

## 2024-05-06 ENCOUNTER — Telehealth: Payer: Self-pay | Admitting: Neurology

## 2024-05-11 ENCOUNTER — Ambulatory Visit: Payer: Self-pay | Admitting: Cardiovascular Disease

## 2024-05-11 ENCOUNTER — Ambulatory Visit (HOSPITAL_COMMUNITY)
Admission: RE | Admit: 2024-05-11 | Discharge: 2024-05-11 | Disposition: A | Discharge status: Home / Self Care | Source: Ambulatory Visit | Attending: Vascular Surgery | Admitting: Vascular Surgery

## 2024-05-11 DIAGNOSIS — I351 Nonrheumatic aortic (valve) insufficiency: Secondary | ICD-10-CM | POA: Diagnosis not present

## 2024-05-11 DIAGNOSIS — I10 Essential (primary) hypertension: Secondary | ICD-10-CM | POA: Insufficient documentation

## 2024-05-11 DIAGNOSIS — I493 Ventricular premature depolarization: Secondary | ICD-10-CM | POA: Diagnosis not present

## 2024-05-11 LAB — ECHOCARDIOGRAM LIMITED
Area-P 1/2: 3.08 cm2
P 1/2 time: 708 ms
S' Lateral: 3.28 cm

## 2024-05-11 NOTE — Telephone Encounter (Signed)
 Pt wife called stating that Pt is out of medication and really need it a refill donepezil  (ARICEPT ) 10 MG tablet   Pt medication is to be sent to  [facility] [id number] [place], [place] - [address] Phone: [phone]  Fax: [phone]

## 2024-05-12 NOTE — Telephone Encounter (Signed)
 Called [facility] and was told they did not have the full refill of Donepezil  and had to order more. They gave wife partial fill of 7 tablets and told her why. The rest of the medication is expected to arrive to the pharmacy tomorrow. They will call her once its ready.   I called the wife back and the patient answered. He requested to take a message as wife was not there. I explained to him what I have documented above. His questions were answered. He said he would let the wife know. I  also sent a mychart message.

## 2024-05-12 NOTE — Telephone Encounter (Signed)
 Rx approved

## 2024-05-12 NOTE — Telephone Encounter (Signed)
 Pt wife called stating  That went to Pharmacy and they gave Pt 7 tablets today . Pt wife is Frustrated and not sure what's going on donepezil  (ARICEPT ) 10 MG tablet   Pharmacy   [id number] - [place], [place] - [address] (Ph: [phone])

## 2024-06-12 NOTE — Progress Notes (Unsigned)
 Cardiology Office Note    Date:  06/13/2024  ID:  ADEN SEK, DOB 06-12-1941, MRN [id number] PCP:  Shayne Anes, MD  Cardiologist:  Stanly DELENA Leavens, MD  Electrophysiologist:  Eulas FORBES Furbish, MD   Chief Complaint:   History of Present Illness: .    AUSTIN HERD is a 83 y.o. male with visit-pertinent history of coronary calcification with last negative nuc 06/2022, right carotid bruit with minimal carotid artery disease 03/2023, memory loss with concern for Lewy Body Dementia,  HTN later with marked orthostatic hypotension, anxiety, BPH, colon polyps, nephrolithasis, vit D deficiency, macrocytic anemia, MGUS, TIA vs cognitive event, frequent PVCs, occasional PACs, NSVT, mild-moderate AI, spinal stenosis who is seen for follow-up.   Prior calcium  score 01/2019 was 846/75%ile followed by normal stress test 05/2019. He had a possible TIA vs cognitive impairment event in 2021 treated with with ASA + Plavix  for 3 weeks followed by ASA monotherapy. CT angio head/neck showed no large vessel occlusion (20-30% prox R ICA and left carotid bulb).   He was evaluated 02/2022 for HR 30s-40s at times on pulse ox felt attributed to frequent PVCs not picking up. 3 day monitor 04/2022 showed avg HR 65bpm, minimum 46bpm, 28% PVC burden, 12 beats NSVT, 17 episodes of NSVT longest of 6 beats, prompting addition of Toprol  25mg  daily and EP evaluation. Echo 03/2022 showed EF 50-55%, mild-moderate AI, mild MR. Nuclear stress test 06/2022 (to exclude ischemia as cause of ectopy) was negative. He saw Dr. Furbish 06/2022 who switched metoprolol  to propranolol  with improvement of PVCs. He was re-evaluated 01/2023 for new orthostasis (SBP 70s) with orthostatic dizziness and falling. Primary care stopped propranolol  due to concern for bradycardia and hypotension and added fludrocortisone  every other day with initial improvement in symptoms. He was still orthostatic at 01/2023 OV here so fludrocortisone  was increased  from every other day to daily. However, labs came back showing new AKI of unclear cause with Cr 2.02, so recommended to go to ER, and he declined, preferring PCP follow-up instead. PCP ended up stopping fludrocortisone . Follow-up labs trended creatinine back to baseline. When I saw him back in follow-up 01/2023, he also noted new DOE. ETT was ordered by EP for chronotropic competence evaluation but deferred due to fall risk. Repeat monitor 01/2023 off propranolol  ordered by Dr. Furbish showed average HR 51bpm, ranging 46-106bpm, 11% PVCs, 3% PACs, one 6 beat run slow NSVT, one 4 beat run NSVT. Repeat echo 01/2023 was similar to prior. He has also begun having issues with spinal stenosis. I had discussed case with Dr. Cherrie (advanced heart failure team) who recommended cMRI to evaluate for amyloidosis -> performed 03/2023 showing LVEF 52%, no evidence of infiltrative disease. Skeletal survey 02/2024 showed faint lucency of distal left radius, no other destructive/lytic lesions, carotid calcification and old T11 fx. He is followed by heme-onc for his MGUS, last OV 08/2023 with plan for 1 year f/u. He has been on/off midodrine  in the past, initially started then stopped due to concern for reflex bradycardia then resumed at 11/2023 OV by Dr. Leavens. He subsequently was started on pyridostigmine  by his neurologist with marked improvement in orthostatic symptoms. EEG 11/2023 showed background slowing, bihemispheric malfunction without epileptiform discharge. Neurology notes indicate concern for Lewy body dementia. He saw Dr. Furbish 03/2024 at which time he was in ventricular bigeminy but Dr. Furbish felt PVCs were unlikely the cause of his issues. F/u echo 04/2024 was stable with EF 50-55%, mild LVH, normal RV, mild-moderate  AI.   He returns for follow-up with his wife Inocente who aids in history. They report he has done well from orthostatic/falling standpoint on pyridostigmine . However, Inocente is concerned that Maicol  continues to have significant daytime fatigue/hypersomnolence. He also has generalized fatigue with minimal activity, now spends his time sleeping a lot during the day. He continues to have some dyspnea with this. He has also begun to have some unintentional weight loss, down 12lb from October. No chest pain, palpitations or edema. Inocente is unsure whether he is still on midodrine  - neuro note indicates that midodrine  resulted in more severe spells. She knows he is no longer on fludrocortisone , unclear when they stopped this. Phone note 01/2024 regarding symptoms is a bit unclear as it mentions both medicines. Regardless, he seems to have found a relatively good sweet spot with his present orthostasis with the pyridostigmine . He has not had any dysphagia, swallowing symptoms, ptosis, or syncope.    Orthostatic VS for the past 24 hrs (Last 3 readings):  BP- Lying Pulse- Lying BP- Sitting Pulse- Sitting BP- Standing at 0 minutes Pulse- Standing at 0 minutes BP- Standing at 3 minutes Pulse- Standing at 3 minutes  06/13/24 1114 144/66 65 110/56 64 114/59 65 100/56 68     Labwork independently reviewed: KPN 11/2023 K 4.4 08/2023 Hgb 12.7, MCV up, Plt 191, K 4.1, CR 1.12, ALT 40, AST 44 Lipids PCP- 08/2023 KPN LDL 22, trig 89, HLD 41 01/2023 TSh OK 2023 Mg 2.4  ROS: .    Please see the history of present illness. All other systems are reviewed and otherwise negative.  Studies Reviewed: SABRA    EKG:  EKG personally reviewed today showed  EKG Interpretation Date/Time:  Monday June 13 2024 12:02:01 EST Ventricular Rate:  57 PR Interval:  216 QRS Duration:  92 QT Interval:  462 QTC Calculation: 449 R Axis:   -38  Text Interpretation: Sinus bradycardia with 1st degree A-V block with Premature atrial complexes in a pattern of bigeminy Left axis deviation Left ventricular hypertrophy with repolarization abnormality ( R in aVL ) When compared with ECG of 06-Apr-2024 09:37, Premature ventricular  complexes are no longer Present Premature atrial complexes are now Present QT has shortened Confirmed by Curlie Macken [phone]) on 06/13/2024 12:06:56 PM     CV Studies: Cardiac studies reviewed are outlined and summarized above. Otherwise please see EMR for full report.   Current Reported Medications:.    Prior to Admission medications  Medication Sig Start Date End Date Taking? Authorizing Provider  acetaminophen  (TYLENOL ) 500 MG tablet Take 1,000 mg by mouth every 6 (six) hours as needed for headache (pain).   Yes [provider]  aspirin  EC 81 MG tablet Take 1 tablet (81 mg total) by mouth daily. 05/11/19  Yes Claudene Victory ORN, MD  buPROPion (WELLBUTRIN XL) 150 MG 24 hr tablet Take 150 mg by mouth every morning.   Yes [provider]  carboxymethylcellulose (REFRESH TEARS) 0.5 % SOLN Place 1 drop into both eyes 3 (three) times daily as needed (dry eyes).   Yes [provider]  Cholecalciferol (DIALYVITE VITAMIN D  5000) 125 MCG (5000 UT) capsule Take 5,000 Units by mouth once a week.   Yes [provider]  Cyanocobalamin  (B-12) 3000 MCG CAPS Take 3,000 mcg by mouth daily.   Yes [provider]  donepezil  (ARICEPT ) 10 MG tablet TAKE 1 TABLET BY MOUTH AT BEDTIME 05/12/24  Yes Gayland Lauraine PARAS, NP  escitalopram  (LEXAPRO ) 20 MG  tablet Take 20 mg by mouth every morning.   Yes [provider]  finasteride (PROSCAR) 5 MG tablet Take 5 mg by mouth daily. Patient taking differently: Take 2.5 mg by mouth 2 (two) times daily.   Yes [provider]  ibuprofen (ADVIL) 400 MG tablet Take by mouth daily. 10/30/23  Yes [provider]  melatonin 5 MG TABS Take 5 mg by mouth at bedtime.   Yes [provider]  memantine  (NAMENDA ) 10 MG tablet TAKE 1 TABLET BY MOUTH 2 TIMES A DAY 05/02/24  Yes Gayland Lauraine PARAS, NP  Multiple Vitamin (MULTIVITAMIN) tablet Take 1 tablet by mouth daily.   Yes [provider]  pyridostigmine  (MESTINON )  60 MG tablet Take 0.5 tablets (30 mg total) by mouth 2 (two) times daily. 03/01/24  Yes Gayland Lauraine PARAS, NP  rosuvastatin  (CRESTOR ) 20 MG tablet Take 20 mg by mouth daily. Takes at bedtime per wife 05/03/19  Yes [provider]  zoledronic  acid (RECLAST ) 5 MG/100ML SOLN injection Inject 5 mg into the vein once.   Yes [provider]     Physical Exam:    VS:  BP (!) 110/56   Pulse 64   Ht 5' 11.5 (1.816 m)   Wt 163 lb (73.9 kg)   SpO2 99%   BMI 22.42 kg/m    Wt Readings from Last 3 Encounters:  06/13/24 163 lb (73.9 kg)  04/06/24 175 lb (79.4 kg)  02/25/24 177 lb (80.3 kg)    GEN: Well nourished, well developed in no acute distress NECK: No JVD; No carotid bruits CARDIAC: RRR, no murmurs, rubs, gallops RESPIRATORY:  Clear to auscultation without rales, wheezing or rhonchi  ABDOMEN: Soft, non-tender, non-distended EXTREMITIES:  No edema; No acute deformity   Asessement and Plan:.    1. Orthostatic hypotension, generalized fatigue - though orthostatics still demonstrate a significant drop in blood pressure to lower end of normal, he was asymptomatic and his symptoms are significantly better on pyridostigmine  started by neurology. 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 His wife will check to see if he is still on midodrine  and let us  know so we can add back to med list. We discussed use of compression hose, not wearing today. He could not tolerate abdominal binder. He continues to have an additional constellation of issues including hypersomnia and weight loss that cannot be easily explained by cardiac etiology. It is unclear to me whether the constellation of issues are expected from neurology in the context of his Lewy Body Dementia. I also considered MG in the differential given his improvement with pyridostigmine , but he does not have a lot of the other localizing symptoms. Wife Inocente tried to seek second opinion with [facility] but had issues getting the  required testing performed before they would see him. We will refer to [facility] neurology per shared decision making. I have also encouraged them to see PCP back given the new weight loss as well. Will also check CMET, CBC, TSH rfx to FT4, CK, Mg today.  2. Dyspnea on exertion - stress testing in 2020 and 06/2022 was reassuring. This seems superimposed on a greater constellation of issues including daytime fatigue, excessive sleepiness, Lewy body dementia, and autonomic neuropathy. Not clearly convinced this is an anginal process. He has not had any chest pain, pressure or burning. PVCs could be contributing to some dyspnea, though would not explain the other issues at hand. Echo stable. Discussed updated ischemic testing and they prefer to  defer for now. Will update labs as above.   3. Frequent PVCs/NSVT, prolonged QT interval - repeat EKG with stable QTc, likely overexaggerated by ectopy seen on 03/2024 tracing. This also shows PACs rather than PVCs, which are known for patient from prior monitor. Dr. Nancey has recommended conservative management, to remain off beta blocker due to h/o bradycardia. Check lytes today, otherwise f/u EP 1 year as directed.   4. Mild-moderate AI - no intervention needed. Consider repeat echo 04/2025, can review in follow-up.  5. Coronary calcification - see comments above. Continue ASA 81mg  daily, can re-eval at each OV to ensure he remains a candidate for such. Lipids followed by PCP.    Disposition: F/u with Dr. Santo in 3 months.  Signed, Visente Kirker N Lekesha Claw, PA-C

## 2024-06-13 ENCOUNTER — Ambulatory Visit: Attending: Physician Assistant | Admitting: Physician Assistant

## 2024-06-13 ENCOUNTER — Encounter: Payer: Self-pay | Admitting: Physician Assistant

## 2024-06-13 ENCOUNTER — Ambulatory Visit: Admitting: Physician Assistant

## 2024-06-13 VITALS — BP 110/56 | HR 64 | Ht 71.5 in | Wt 163.0 lb

## 2024-06-13 DIAGNOSIS — I951 Orthostatic hypotension: Secondary | ICD-10-CM

## 2024-06-13 DIAGNOSIS — R9431 Abnormal electrocardiogram [ECG] [EKG]: Secondary | ICD-10-CM | POA: Diagnosis not present

## 2024-06-13 DIAGNOSIS — I351 Nonrheumatic aortic (valve) insufficiency: Secondary | ICD-10-CM | POA: Diagnosis not present

## 2024-06-13 DIAGNOSIS — I4729 Other ventricular tachycardia: Secondary | ICD-10-CM

## 2024-06-13 DIAGNOSIS — R0609 Other forms of dyspnea: Secondary | ICD-10-CM | POA: Diagnosis not present

## 2024-06-13 DIAGNOSIS — I493 Ventricular premature depolarization: Secondary | ICD-10-CM | POA: Diagnosis not present

## 2024-06-13 DIAGNOSIS — I2510 Atherosclerotic heart disease of native coronary artery without angina pectoris: Secondary | ICD-10-CM

## 2024-06-13 DIAGNOSIS — R5383 Other fatigue: Secondary | ICD-10-CM | POA: Diagnosis not present

## 2024-06-13 DIAGNOSIS — I491 Atrial premature depolarization: Secondary | ICD-10-CM

## 2024-06-13 NOTE — Patient Instructions (Addendum)
 Medication Instructions:  PLEASE CONTACT THE OFFICE WHEN YOU GET HOME AND CLARIFY IF THE PATIENT IS TAKING MIDODRINE  OR NOT  *If you need a refill on your cardiac medications before your next appointment, please call your pharmacy*  Lab Work: TODAY-CBC, CMET, TSH, FREE T4, MAG & CK LEVEL If you have labs (blood work) drawn today and your tests are completely normal, you will receive your results only by: MyChart Message (if you have MyChart) OR A paper copy in the mail If you have any lab test that is abnormal or we need to change your treatment, we will call you to review the results.  Testing/Procedures: NONE ORDERED  Follow-Up: At [facility], you and your health needs are our priority.  As part of our continuing mission to provide you with exceptional heart care, our providers are all part of one team.  This team includes your primary Cardiologist (physician) and Advanced Practice Providers or APPs (Physician Assistants and Nurse Practitioners) who all work together to provide you with the care you need, when you need it.  Your next appointment:   3 month(s)  Provider:   Stanly DELENA Leavens, MD ONLY  We recommend signing up for the patient portal called MyChart.  Sign up information is provided on this After Visit Summary.  MyChart is used to connect with patients for Virtual Visits (Telemedicine).  Patients are able to view lab/test results, encounter notes, upcoming appointments, etc.  Non-urgent messages can be sent to your provider as well.   To learn more about what you can do with MyChart, go to [email or web address].   Other Instructions You have been referred to [facility] (DX: DEMENTIA & HYPOTENSION)

## 2024-06-14 ENCOUNTER — Telehealth: Payer: Self-pay | Admitting: Physician Assistant

## 2024-06-14 ENCOUNTER — Ambulatory Visit: Payer: Self-pay | Admitting: Physician Assistant

## 2024-06-14 LAB — CBC
Hematocrit: 34.8 % — ABNORMAL LOW (ref 37.5–51.0)
Hemoglobin: 11.8 g/dL — ABNORMAL LOW (ref 13.0–17.7)
MCH: 35.6 pg — ABNORMAL HIGH (ref 26.6–33.0)
MCHC: 33.9 g/dL (ref 31.5–35.7)
MCV: 105 fL — ABNORMAL HIGH (ref 79–97)
Platelets: 215 x10E3/uL (ref 150–450)
RBC: 3.31 x10E6/uL — ABNORMAL LOW (ref 4.14–5.80)
RDW: 12.4 % (ref 11.6–15.4)
WBC: 5.7 x10E3/uL (ref 3.4–10.8)

## 2024-06-14 LAB — COMPREHENSIVE METABOLIC PANEL WITH GFR
ALT: 26 IU/L (ref 0–44)
AST: 31 IU/L (ref 0–40)
Albumin: 4.4 g/dL (ref 3.7–4.7)
Alkaline Phosphatase: 46 IU/L — ABNORMAL LOW (ref 48–129)
BUN/Creatinine Ratio: 24 (ref 10–24)
BUN: 31 mg/dL — ABNORMAL HIGH (ref 8–27)
Bilirubin Total: 0.9 mg/dL (ref 0.0–1.2)
CO2: 21 mmol/L (ref 20–29)
Calcium: 9.3 mg/dL (ref 8.6–10.2)
Chloride: 106 mmol/L (ref 96–106)
Creatinine, Ser: 1.28 mg/dL — ABNORMAL HIGH (ref 0.76–1.27)
Globulin, Total: 2.1 g/dL (ref 1.5–4.5)
Glucose: 101 mg/dL — ABNORMAL HIGH (ref 70–99)
Potassium: 4.8 mmol/L (ref 3.5–5.2)
Sodium: 141 mmol/L (ref 134–144)
Total Protein: 6.5 g/dL (ref 6.0–8.5)
eGFR: 56 mL/min/1.73 — ABNORMAL LOW (ref >59)

## 2024-06-14 LAB — CK: Total CK: 130 U/L (ref 30–208)

## 2024-06-14 LAB — MAGNESIUM: Magnesium: 2.2 mg/dL (ref 1.6–2.3)

## 2024-06-14 LAB — TSH+FREE T4
Free T4: 1.27 ng/dL (ref 0.82–1.77)
TSH: 0.661 u[IU]/mL (ref 0.450–4.500)

## 2024-06-14 NOTE — Telephone Encounter (Signed)
 Spoke with patient's wife Patrick Macias, she states patient has not been taking midodrine . They do not have the prescription at home.  Searched chart and found that midodrine  was removed from patient's medication list at office visit with Dr. Nancey on 04/06/24 indicating Completed Course as reason for removal.   Also noted by Lauraine Born, NP with neurology during office visit on 02/25/2024: Cardiology started midodrine  5 mg TID made his spells of weakness worse, was falling more. Now taking Florinef  0.1 mg BID, great last 4 days, but today he had a few spells while standing, got shaky in arms, then legs, had to sit down, was not wearing compression stockings, happened 4 times today, no more than a minute.   Patient's wife states that must be why the medication was stopped. She states patient tolerates pyridostigmine  much better.  Will forward to [name] Dunn, PA-C to review.

## 2024-06-14 NOTE — Telephone Encounter (Signed)
 Pt c/o medication issue:  1. Name of Medication: Midodrine    2. How are you currently taking this medication (dosage and times per day)? Unknown   3. Are you having a reaction (difficulty breathing--STAT)? No   4. What is your medication issue? Pt's stated this Medication was prescribed yesterday and would like to f/u with a nurse with some addt questions.  Please advise

## 2024-06-15 NOTE — Telephone Encounter (Signed)
 To reader: wife Inocente assists with care since patient has memory loss.  Excellent - thank you for confirming re: midodrine . We did not think he was still on this but wanted to be sure.  Saw the copied note below had mentioned Florinef  aka fludrocortisone . At OV 12/15, Inocente thought he was no longer on this either. Can you please confirm with her review of med bottles at home? Just need to know if pyridostigmine  is the only orthostatic hypotension medication he is on. If this is the case -> continue pyridostigmine  as prescribed. If he IS on it in addition to the pyridostigmine , please add back to medicine list.  I do not see the APP results pool relayed his lab results yet - please let them know the labs overall looked OK except his kidney function looks a smidge worse than before. It looks like he may not be getting enough fluid intake and needs to up his water intake (but not to excess) - aim for 64oz per day. He is not on any heart medicines that would cause this, so it encourage close follow up with Dr. Shayne to discuss further monitoring. He also remains borderline anemic but similar to prior trends. Thank you.

## 2024-06-15 NOTE — Telephone Encounter (Signed)
 Left message to call back.

## 2024-06-22 NOTE — Telephone Encounter (Signed)
 Called, not able to leave vm, Mailbox is full.

## 2024-06-28 NOTE — Telephone Encounter (Signed)
 Left the patient a message to call the office back.

## 2024-07-27 ENCOUNTER — Telehealth: Payer: Self-pay | Admitting: Physician Assistant

## 2024-07-27 NOTE — Telephone Encounter (Signed)
 Rock from [employer] stating she had received a referral to [facility] and thinks it was sent to her mistake. Please advise

## 2024-07-27 NOTE — Telephone Encounter (Signed)
 Spoke with Rock with [employer] in [place]. States somehow she received referral to [facility]. Not sure if this was supposed to be sent to the patient or the Neurology office. Will send to [name] Dunn, PA-C and Ula Hummer for clarification.

## 2024-07-28 ENCOUNTER — Other Ambulatory Visit: Payer: Self-pay

## 2024-09-05 ENCOUNTER — Ambulatory Visit: Admitting: Neurology

## 2024-09-14 ENCOUNTER — Inpatient Hospital Stay

## 2024-09-21 ENCOUNTER — Inpatient Hospital Stay: Admitting: Hematology and Oncology

## 2024-10-03 ENCOUNTER — Ambulatory Visit: Admitting: Internal Medicine
